# Patient Record
Sex: Female | Born: 1935 | Race: White | Hispanic: No | Smoking: Never smoker
Health system: Southern US, Community
[De-identification: ages and names within clinical notes are randomized; demographics above are authoritative.]

## PROBLEM LIST (undated history)

## (undated) DIAGNOSIS — N3281 Overactive bladder: Secondary | ICD-10-CM

## (undated) DIAGNOSIS — I272 Pulmonary hypertension, unspecified: Secondary | ICD-10-CM

## (undated) DIAGNOSIS — I4891 Unspecified atrial fibrillation: Secondary | ICD-10-CM

## (undated) DIAGNOSIS — I2510 Atherosclerotic heart disease of native coronary artery without angina pectoris: Secondary | ICD-10-CM

## (undated) DIAGNOSIS — I340 Nonrheumatic mitral (valve) insufficiency: Secondary | ICD-10-CM

## (undated) DIAGNOSIS — Z950 Presence of cardiac pacemaker: Secondary | ICD-10-CM

## (undated) DIAGNOSIS — I255 Ischemic cardiomyopathy: Secondary | ICD-10-CM

## (undated) DIAGNOSIS — I9789 Other postprocedural complications and disorders of the circulatory system, not elsewhere classified: Secondary | ICD-10-CM

## (undated) DIAGNOSIS — I6529 Occlusion and stenosis of unspecified carotid artery: Secondary | ICD-10-CM

## (undated) DIAGNOSIS — C801 Malignant (primary) neoplasm, unspecified: Secondary | ICD-10-CM

## (undated) DIAGNOSIS — I442 Atrioventricular block, complete: Secondary | ICD-10-CM

## (undated) DIAGNOSIS — I639 Cerebral infarction, unspecified: Secondary | ICD-10-CM

## (undated) DIAGNOSIS — I5042 Chronic combined systolic (congestive) and diastolic (congestive) heart failure: Secondary | ICD-10-CM

## (undated) DIAGNOSIS — M1990 Unspecified osteoarthritis, unspecified site: Secondary | ICD-10-CM

## (undated) DIAGNOSIS — E785 Hyperlipidemia, unspecified: Secondary | ICD-10-CM

## (undated) DIAGNOSIS — E119 Type 2 diabetes mellitus without complications: Secondary | ICD-10-CM

## (undated) DIAGNOSIS — I10 Essential (primary) hypertension: Secondary | ICD-10-CM

## (undated) DIAGNOSIS — N189 Chronic kidney disease, unspecified: Secondary | ICD-10-CM

## (undated) HISTORY — DX: Atherosclerotic heart disease of native coronary artery without angina pectoris: I25.10

## (undated) HISTORY — DX: Hyperlipidemia, unspecified: E78.5

## (undated) HISTORY — DX: Other postprocedural complications and disorders of the circulatory system, not elsewhere classified: I48.91

## (undated) HISTORY — DX: Other postprocedural complications and disorders of the circulatory system, not elsewhere classified: I97.89

## (undated) HISTORY — DX: Nonrheumatic mitral (valve) insufficiency: I34.0

## (undated) HISTORY — DX: Occlusion and stenosis of unspecified carotid artery: I65.29

## (undated) HISTORY — DX: Atrioventricular block, complete: I44.2

## (undated) HISTORY — DX: Pulmonary hypertension, unspecified: I27.20

## (undated) HISTORY — DX: Malignant (primary) neoplasm, unspecified: C80.1

## (undated) HISTORY — DX: Chronic combined systolic (congestive) and diastolic (congestive) heart failure: I50.42

## (undated) HISTORY — DX: Ischemic cardiomyopathy: I25.5

## (undated) HISTORY — PX: CATARACT EXTRACTION W/ INTRAOCULAR LENS  IMPLANT, BILATERAL: SHX1307

## (undated) HISTORY — DX: Essential (primary) hypertension: I10

---
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
## 1974-12-31 HISTORY — PX: SALPINGOOPHORECTOMY: SHX82

## 1996-12-31 HISTORY — PX: SHOULDER ARTHROSCOPY W/ ROTATOR CUFF REPAIR: SHX2400

## 2000-01-01 HISTORY — PX: TOTAL HIP ARTHROPLASTY: SHX124

## 2000-07-15 ENCOUNTER — Encounter: Payer: Self-pay | Admitting: Family Medicine

## 2000-07-15 ENCOUNTER — Encounter: Admission: RE | Admit: 2000-07-15 | Discharge: 2000-07-15 | Payer: Self-pay | Admitting: Family Medicine

## 2000-11-19 ENCOUNTER — Encounter: Payer: Self-pay | Admitting: Orthopedic Surgery

## 2000-11-25 ENCOUNTER — Inpatient Hospital Stay (HOSPITAL_COMMUNITY): Admission: RE | Admit: 2000-11-25 | Discharge: 2000-11-28 | Payer: Self-pay | Admitting: Orthopedic Surgery

## 2000-11-27 ENCOUNTER — Encounter: Payer: Self-pay | Admitting: Orthopedic Surgery

## 2000-11-28 ENCOUNTER — Inpatient Hospital Stay (HOSPITAL_COMMUNITY)
Admission: RE | Admit: 2000-11-28 | Discharge: 2000-12-05 | Payer: Self-pay | Admitting: Physical Medicine & Rehabilitation

## 2001-07-16 ENCOUNTER — Encounter: Admission: RE | Admit: 2001-07-16 | Discharge: 2001-07-16 | Payer: Self-pay | Admitting: Family Medicine

## 2001-07-16 ENCOUNTER — Encounter: Payer: Self-pay | Admitting: Family Medicine

## 2002-07-16 ENCOUNTER — Encounter: Payer: Self-pay | Admitting: Family Medicine

## 2002-07-16 ENCOUNTER — Encounter: Admission: RE | Admit: 2002-07-16 | Discharge: 2002-07-16 | Payer: Self-pay | Admitting: Family Medicine

## 2002-10-01 ENCOUNTER — Ambulatory Visit (HOSPITAL_COMMUNITY): Admission: RE | Admit: 2002-10-01 | Discharge: 2002-10-01 | Payer: Self-pay | Admitting: Gastroenterology

## 2003-07-01 ENCOUNTER — Encounter: Payer: Self-pay | Admitting: Family Medicine

## 2003-07-01 ENCOUNTER — Encounter: Admission: RE | Admit: 2003-07-01 | Discharge: 2003-07-01 | Payer: Self-pay | Admitting: Family Medicine

## 2004-07-07 ENCOUNTER — Ambulatory Visit (HOSPITAL_COMMUNITY): Admission: RE | Admit: 2004-07-07 | Discharge: 2004-07-07 | Payer: Self-pay | Admitting: Family Medicine

## 2005-07-13 ENCOUNTER — Ambulatory Visit (HOSPITAL_COMMUNITY): Admission: RE | Admit: 2005-07-13 | Discharge: 2005-07-13 | Payer: Self-pay | Admitting: Family Medicine

## 2005-07-31 DIAGNOSIS — I639 Cerebral infarction, unspecified: Secondary | ICD-10-CM

## 2005-07-31 HISTORY — PX: CORONARY ARTERY BYPASS GRAFT: SHX141

## 2005-07-31 HISTORY — DX: Cerebral infarction, unspecified: I63.9

## 2005-07-31 HISTORY — PX: MITRAL VALVE REPLACEMENT: SHX147

## 2005-08-15 ENCOUNTER — Encounter: Admission: RE | Admit: 2005-08-15 | Discharge: 2005-08-15 | Payer: Self-pay | Admitting: Cardiology

## 2005-08-17 ENCOUNTER — Inpatient Hospital Stay (HOSPITAL_COMMUNITY): Admission: AD | Admit: 2005-08-17 | Discharge: 2005-09-03 | Payer: Self-pay | Admitting: Cardiology

## 2005-08-17 ENCOUNTER — Ambulatory Visit: Payer: Self-pay | Admitting: Physical Medicine & Rehabilitation

## 2005-08-17 ENCOUNTER — Encounter (INDEPENDENT_AMBULATORY_CARE_PROVIDER_SITE_OTHER): Payer: Self-pay | Admitting: Cardiology

## 2005-08-20 ENCOUNTER — Encounter (INDEPENDENT_AMBULATORY_CARE_PROVIDER_SITE_OTHER): Payer: Self-pay | Admitting: Specialist

## 2005-10-06 ENCOUNTER — Inpatient Hospital Stay (HOSPITAL_COMMUNITY): Admission: EM | Admit: 2005-10-06 | Discharge: 2005-10-09 | Payer: Self-pay | Admitting: Emergency Medicine

## 2005-11-30 HISTORY — PX: CARDIAC DEFIBRILLATOR PLACEMENT: SHX171

## 2005-12-07 ENCOUNTER — Inpatient Hospital Stay (HOSPITAL_COMMUNITY): Admission: RE | Admit: 2005-12-07 | Discharge: 2005-12-08 | Payer: Self-pay | Admitting: Cardiology

## 2006-07-19 ENCOUNTER — Ambulatory Visit (HOSPITAL_COMMUNITY): Admission: RE | Admit: 2006-07-19 | Discharge: 2006-07-19 | Payer: Self-pay | Admitting: Family Medicine

## 2006-11-26 ENCOUNTER — Encounter: Admission: RE | Admit: 2006-11-26 | Discharge: 2006-11-26 | Payer: Self-pay | Admitting: Family Medicine

## 2007-04-15 ENCOUNTER — Ambulatory Visit: Payer: Self-pay | Admitting: Vascular Surgery

## 2007-04-15 ENCOUNTER — Ambulatory Visit (HOSPITAL_COMMUNITY): Admission: RE | Admit: 2007-04-15 | Discharge: 2007-04-15 | Payer: Self-pay | Admitting: Cardiology

## 2007-04-15 ENCOUNTER — Encounter: Payer: Self-pay | Admitting: Vascular Surgery

## 2007-04-30 ENCOUNTER — Emergency Department (HOSPITAL_COMMUNITY): Admission: EM | Admit: 2007-04-30 | Discharge: 2007-04-30 | Payer: Self-pay | Admitting: Emergency Medicine

## 2007-06-02 ENCOUNTER — Ambulatory Visit: Payer: Self-pay | Admitting: Vascular Surgery

## 2007-07-25 ENCOUNTER — Ambulatory Visit (HOSPITAL_COMMUNITY): Admission: RE | Admit: 2007-07-25 | Discharge: 2007-07-25 | Payer: Self-pay | Admitting: Family Medicine

## 2007-09-16 ENCOUNTER — Encounter: Admission: RE | Admit: 2007-09-16 | Discharge: 2007-09-16 | Payer: Self-pay | Admitting: Family Medicine

## 2008-06-11 ENCOUNTER — Ambulatory Visit: Payer: Self-pay | Admitting: Vascular Surgery

## 2008-07-30 ENCOUNTER — Ambulatory Visit (HOSPITAL_COMMUNITY): Admission: RE | Admit: 2008-07-30 | Discharge: 2008-07-30 | Payer: Self-pay | Admitting: Family Medicine

## 2008-11-13 ENCOUNTER — Emergency Department (HOSPITAL_COMMUNITY): Admission: EM | Admit: 2008-11-13 | Discharge: 2008-11-13 | Payer: Self-pay | Admitting: Emergency Medicine

## 2009-02-25 ENCOUNTER — Encounter: Admission: RE | Admit: 2009-02-25 | Discharge: 2009-02-25 | Payer: Self-pay | Admitting: Orthopedic Surgery

## 2009-06-24 ENCOUNTER — Ambulatory Visit: Payer: Self-pay | Admitting: Vascular Surgery

## 2009-08-01 ENCOUNTER — Ambulatory Visit (HOSPITAL_COMMUNITY): Admission: RE | Admit: 2009-08-01 | Discharge: 2009-08-01 | Payer: Self-pay | Admitting: Family Medicine

## 2009-09-23 ENCOUNTER — Emergency Department (HOSPITAL_COMMUNITY): Admission: EM | Admit: 2009-09-23 | Discharge: 2009-09-23 | Payer: Self-pay | Admitting: Emergency Medicine

## 2010-01-22 ENCOUNTER — Emergency Department (HOSPITAL_COMMUNITY): Admission: EM | Admit: 2010-01-22 | Discharge: 2010-01-22 | Payer: Self-pay | Admitting: Emergency Medicine

## 2010-06-16 ENCOUNTER — Ambulatory Visit: Payer: Self-pay | Admitting: Vascular Surgery

## 2010-08-02 ENCOUNTER — Ambulatory Visit (HOSPITAL_COMMUNITY): Admission: RE | Admit: 2010-08-02 | Discharge: 2010-08-02 | Payer: Self-pay | Admitting: Family Medicine

## 2011-03-18 LAB — CBC
Hemoglobin: 11.7 g/dL — ABNORMAL LOW (ref 12.0–15.0)
MCHC: 34.6 g/dL (ref 30.0–36.0)
MCV: 96 fL (ref 78.0–100.0)
RDW: 14.4 % (ref 11.5–15.5)

## 2011-03-18 LAB — POCT I-STAT, CHEM 8
Calcium, Ion: 1.3 mmol/L (ref 1.12–1.32)
Chloride: 104 mEq/L (ref 96–112)
Glucose, Bld: 145 mg/dL — ABNORMAL HIGH (ref 70–99)
HCT: 35 % — ABNORMAL LOW (ref 36.0–46.0)
Hemoglobin: 11.9 g/dL — ABNORMAL LOW (ref 12.0–15.0)
TCO2: 28 mmol/L (ref 0–100)

## 2011-03-18 LAB — DIFFERENTIAL
Basophils Absolute: 0.1 10*3/uL (ref 0.0–0.1)
Basophils Relative: 1 % (ref 0–1)
Eosinophils Absolute: 0.2 10*3/uL (ref 0.0–0.7)
Monocytes Absolute: 0.8 10*3/uL (ref 0.1–1.0)
Monocytes Relative: 13 % — ABNORMAL HIGH (ref 3–12)
Neutro Abs: 4.5 10*3/uL (ref 1.7–7.7)

## 2011-03-19 ENCOUNTER — Encounter (INDEPENDENT_AMBULATORY_CARE_PROVIDER_SITE_OTHER): Payer: Self-pay | Admitting: *Deleted

## 2011-03-29 NOTE — Letter (Signed)
Summary: Appointment - Reminder 2  [facility], Main Office  [address]   [place], [place] [address]   Phone: [phone]  Fax: [phone]     March 19, 2011 MRN: [id number]   [facility] [address] [place], [place]  [address]   Dear [name],  Our records indicate that it is time to schedule a follow-up appointment.  Dr. Johney Frame recommended that you follow up with [place] in April. It is very important that we reach you to schedule this appointment. We look forward to participating in your health care needs. Please contact us at the number listed above at your earliest convenience to schedule your appointment.  If you are unable to make an appointment at this time, give [place] a call so we can update our records.     Sincerely,   Glass blower/designer

## 2011-03-29 NOTE — Letter (Signed)
Summary: Appointment - Reminder 2  [facility], Main Office  [address]   [place], [place] [address]   Phone: [phone]  Fax: [phone]     March 19, 2011 MRN: [id number]   [name] [address] [place], [place]  [address]   Dear Ms. Crance,  Our records indicate that it is time to schedule a follow-up appointment.  Dr. Allred recommended that you follow up with us in April. It is very important that we reach you to schedule this appointment. We look forward to participating in your health care needs. Please contact us at the number listed above at your earliest convenience to schedule your appointment.  If you are unable to make an appointment at this time, give us a call so we can update our records.     Sincerely,   [facility] Scheduling Team 

## 2011-04-06 LAB — COMPREHENSIVE METABOLIC PANEL
AST: 25 U/L (ref 0–37)
Albumin: 3.8 g/dL (ref 3.5–5.2)
Alkaline Phosphatase: 50 U/L (ref 39–117)
BUN: 22 mg/dL (ref 6–23)
Chloride: 108 mEq/L (ref 96–112)
Potassium: 4 mEq/L (ref 3.5–5.1)
Sodium: 140 mEq/L (ref 135–145)
Total Bilirubin: 0.7 mg/dL (ref 0.3–1.2)
Total Protein: 6.5 g/dL (ref 6.0–8.3)

## 2011-04-06 LAB — URINALYSIS, ROUTINE W REFLEX MICROSCOPIC
Bilirubin Urine: NEGATIVE
Ketones, ur: NEGATIVE mg/dL
Nitrite: NEGATIVE
Protein, ur: NEGATIVE mg/dL
Urobilinogen, UA: 0.2 mg/dL (ref 0.0–1.0)
pH: 7 (ref 5.0–8.0)

## 2011-04-06 LAB — DIFFERENTIAL
Basophils Relative: 0 % (ref 0–1)
Eosinophils Absolute: 0.2 10*3/uL (ref 0.0–0.7)
Lymphs Abs: 1 10*3/uL (ref 0.7–4.0)
Monocytes Absolute: 0.5 10*3/uL (ref 0.1–1.0)
Monocytes Relative: 12 % (ref 3–12)
Neutrophils Relative %: 59 % (ref 43–77)

## 2011-04-06 LAB — CBC
Hemoglobin: 11.3 g/dL — ABNORMAL LOW (ref 12.0–15.0)
MCHC: 33.8 g/dL (ref 30.0–36.0)
Platelets: 143 10*3/uL — ABNORMAL LOW (ref 150–400)
RDW: 16 % — ABNORMAL HIGH (ref 11.5–15.5)

## 2011-04-26 ENCOUNTER — Encounter: Payer: Self-pay | Admitting: Internal Medicine

## 2011-04-27 ENCOUNTER — Encounter: Payer: Self-pay | Admitting: Internal Medicine

## 2011-04-30 ENCOUNTER — Encounter: Payer: Self-pay | Admitting: Internal Medicine

## 2011-04-30 ENCOUNTER — Ambulatory Visit (INDEPENDENT_AMBULATORY_CARE_PROVIDER_SITE_OTHER): Payer: Medicare Other | Admitting: Internal Medicine

## 2011-04-30 VITALS — BP 140/70 | HR 63 | Ht 62.0 in | Wt 169.0 lb

## 2011-04-30 DIAGNOSIS — I10 Essential (primary) hypertension: Secondary | ICD-10-CM

## 2011-04-30 DIAGNOSIS — I5022 Chronic systolic (congestive) heart failure: Secondary | ICD-10-CM | POA: Insufficient documentation

## 2011-04-30 DIAGNOSIS — I428 Other cardiomyopathies: Secondary | ICD-10-CM

## 2011-04-30 DIAGNOSIS — I2510 Atherosclerotic heart disease of native coronary artery without angina pectoris: Secondary | ICD-10-CM

## 2011-04-30 DIAGNOSIS — I509 Heart failure, unspecified: Secondary | ICD-10-CM

## 2011-04-30 NOTE — Assessment & Plan Note (Signed)
Stable without ischemic symptoms No change required today  

## 2011-04-30 NOTE — Patient Instructions (Signed)
Your physician wants you to follow-up in: 12 months with Dr Jacquiline Doe will receive a reminder letter in the mail two months in advance. If you don't receive a letter, please call our office to schedule the follow-up appointment.  Remote monitoring is used to monitor your Pacemaker of ICD from home. This monitoring reduces the number of office visits required to check your device to one time per year. It allows [place] to keep an eye on the functioning of your device to ensure it is working properly. You are scheduled for a device check from home on 08/02/2011. You may send your transmission at any time that day. If you have a wireless device, the transmission will be sent automatically. After your physician reviews your transmission, you will receive a postcard with your next transmission date.

## 2011-04-30 NOTE — Assessment & Plan Note (Signed)
Controlled. No changes today. 

## 2011-04-30 NOTE — Progress Notes (Signed)
[name] is a pleasant 77 y.o. yo patient with a h/o CAD, ischemic CM, and chronic systolic dysfunction sp ICD (MDT) by Dr Amil Amen for primary prevention of sudden death who presents today to establish care in the Electrophysiology device clinic.   The patient reports doing very well since having her ICD implanted and remains very active despite her age.  She has a Medtronic Fidelis [id number] RV lead but has had no issues with this.  Today, she  denies symptoms of palpitations, chest pain, shortness of breath, orthopnea, PND, lower extremity edema, dizziness, presyncope, syncope, or neurologic sequela.  She is primarily limited by arthritis.  The patientis tolerating medications without difficulties and is otherwise without complaint today.   Past Medical History  Diagnosis Date  . Coronary artery disease   . Ischemic cardiomyopathy     s/p ICD (MDT) by Dr Amil Amen 2006  . Carotid stenosis   . Diabetes mellitus   . Hypertension   . Mitral regurgitation   . Dyslipidemia   . Cerebrovascular disease     extracranial  . Osteoporosis   . Chronic systolic dysfunction of left ventricle     Past Surgical History  Procedure Date  . Cardiac defibrillator placement 2006    by Dr Amil Amen (MDT) she has a [phone] fidelis lead  . Cataract extraction   . Mitral valve repair   . Appendectomy   . Coronary artery bypass graft 2006  . Salpingoophorectomy 1976    bilateral  . Righ hip repla   . Total hip arthroplasty 2001    right    History   Social History  . Marital Status: Widowed    Spouse Name: N/A    Number of Children: N/A  . Years of Education: N/A   Occupational History  . retired Engineer, civil (consulting)    Social History Main Topics  . Smoking status: Never Smoker   . Smokeless tobacco: Not on file  . Alcohol Use: No  . Drug Use: No  . Sexually Active: Not on file   Other Topics Concern  . Not on file   Social History Narrative  . No narrative on file    Family History  Problem Relation  Age of Onset  . Coronary artery disease      family hx of  . Hypertension      family hx of  . Diabetes      family hx of    Allergies  Allergen Reactions  . Clonidine Derivatives   . Darvocet (Propoxyphene N-Acetaminophen)   . Fosamax   . Horse-Derived Products   . Penicillins   . Sulfa Drugs Cross Reactors   . Vicodin (Hydrocodone-Acetaminophen)     Current Outpatient Prescriptions  Medication Sig Dispense Refill  . Acetaminophen (TYLENOL EXTRA STRENGTH PO) Take 1-2 tablets by mouth every 6 (six) hours as needed.        Marland Kitchen amLODipine (NORVASC) 5 MG tablet Take 5 mg by mouth daily.        Marland Kitchen aspirin 325 MG EC tablet Take 325 mg by mouth daily.        . Calcium Carbonate-Vitamin D (CALTRATE 600+D) 600-400 MG-UNIT per tablet Take 1 tablet by mouth 2 (two) times daily.        . carvedilol (COREG) 12.5 MG tablet Take 12.5 mg by mouth 2 (two) times daily with a meal.        . Cholecalciferol (VITAMIN D) 2000 UNITS tablet Take 2,000 Units by mouth daily.        Marland Kitchen  cyanocobalamin 2000 MCG tablet Take 2,000 mcg by mouth daily.        Marland Kitchen ezetimibe-simvastatin (VYTORIN) 10-20 MG per tablet Take 1 tablet by mouth at bedtime.        . fish oil-omega-3 fatty acids 1000 MG capsule Take 2 g by mouth 2 (two) times daily.        . furosemide (LASIX) 40 MG tablet Take 20 mg by mouth daily.        Marland Kitchen glipiZIDE (GLUCOTROL) 10 MG 24 hr tablet Take 20 mg by mouth daily.        Marland Kitchen losartan (COZAAR) 100 MG tablet Take 100 mg by mouth daily.        . Multiple Vitamins-Minerals (CENTRUM SILVER PO) Take 2 tablets by mouth daily.        Marland Kitchen oxybutynin (DITROPAN-XL) 10 MG 24 hr tablet Take 10 mg by mouth daily.        . ramipril (ALTACE) 10 MG capsule Take 10 mg by mouth daily.          ROS- all systems are reviewed and negative except as per HPI  Physical Exam: Filed Vitals:   04/30/11 1104  BP: 140/70  Pulse: 63  Height: 5\' 2"  (1.575 m)  Weight: 169 lb (76.658 kg)    GEN- The patient is well  appearing but elderly, alert and oriented x 3 today.   Head- normocephalic, atraumatic Eyes-  Sclera clear, conjunctiva pink Ears- hearing intact Oropharynx- clear Neck- supple, no JVP Lymph- no cervical lymphadenopathy Lungs- Clear to ausculation bilaterally, normal work of breathing Chest- ICD pocket is well healed Heart- Regular rate and rhythm, no murmurs, rubs or gallops, PMI not laterally displaced GI- soft, NT, ND, + BS Extremities- no clubbing, cyanosis,  + edema MS- no significant deformity or atrophy Skin- no rash or lesion Psych- euthymic mood, full affect Neuro- strength and sensation are intact  ICD interrogation- reviewed in detail today,  See PACEART report  Assessment and Plan:

## 2011-04-30 NOTE — Assessment & Plan Note (Signed)
Currently euvolemic on exam. Normal defibrillator function She has a sprint fidelis RV lead without impedance issues or elevated SIC counts.  She has a magnet and has been instructed upon how to use it should she receive inappropriate shocks for failed RV lead. We had a long discussion today about her ICD and RV lead.  She is very clear that she wishes to avoid any further procedures in the future.  She wishes to consider turning her ICD off if she develops problems with her fidelis lead.  She is unlikely to want generator replacement once she is at [facility]. Given her advanced age, I think that these are reasonable requests. We will continue current therapy at this time and follow. See Arita Miss Art report No changes today

## 2011-05-15 NOTE — Consult Note (Signed)
VASCULAR SURGERY CONSULTATION   [name], [name]  DOB:  12-17-1928                                       06/02/2007  ZOXWR#:[id number]   [name] presents today for evaluation of x-ray and cerebrovascular  occlusive disease.  She is a very pleasant 77 year old white female with  recent carotid duplex revealing probable right internal carotid artery  occlusion and moderate left internal carotid artery stenosis.  She  reports some generalized weakness but denies any specific focal  neurologic deficits.  Her past history is significant for non-insulin  dependent diabetes, hypertension and congestive heart failure.  She does  have accelerated premature atherosclerotic disease in her mother,  brothers and sisters.  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  She does not smoke or drink alcohol on a regular basis.   REVIEW OF SYSTEMS:  Positive for shortness of breath, exertion, urinary  frequency, arthritic joint pain, also has had cataract surgery.   PHYSICAL EXAMINATION:  GENERAL:  Well-developed, well-nourished white  female, appearing stated age 60.  VITAL SIGNS:  Blood pressure today is 168/90, pulse 74, respirations 18.  Her carotid arteries are without bruit today by my exam.  She is grossly  intact neurologically.  She has 2+ radial pulses.  I have reviewed her  recent carotid duplex from [facility].  On April 15, 2007, I  compared this to her study at the time of her cardiac surgery in 2006.  She does appear to have an occluded right internal carotid, and this  appears to be consistent with her study two years ago.  I explained the  importance of continued surveillance of her left carotids, and she has  an  occluded right carotid artery.  We will see her again in one year, and  she knows to notify me should she develop any neurologic deficits in the  interim.   Larina Earthly, M.D.  Electronically Signed  TFE/MEDQ  D:  06/02/2007  T:   06/02/2007  Job:  [id number]   cc:   Armanda Magic, M.D.  Donia Guiles, M.D.

## 2011-05-15 NOTE — Procedures (Signed)
CAROTID DUPLEX EXAM   INDICATION:  Follow up known carotid disease.   HISTORY:  Diabetes:  Yes  Cardiac:  CABG in 2006  Hypertension:  Yes  Smoking:  No  Previous Surgery:  No  CV History:  Asymptomatic  Amaurosis Fugax No, Paresthesias No, Hemiparesis No                                       RIGHT             LEFT  Brachial systolic pressure:         150               150  Brachial Doppler waveforms:         normal            normal  Vertebral direction of flow:        antegrade         antegrade  DUPLEX VELOCITIES (cm/sec)  CCA peak systolic                   107               96  ECA peak systolic                   90                86  ICA peak systolic                   occlusive         171  ICA end diastolic                   occlusive         36  PLAQUE MORPHOLOGY:                  calcific          calcific  PLAQUE AMOUNT:                      occlusive         moderate  PLAQUE LOCATION:                    ICA               ICA   IMPRESSION:  1. Known right internal carotid artery occlusion.  2. Left Doppler velocities suggest 40%-59% stenosis in the left      internal carotid artery.  3. Antegrade flow in bilateral vertebral arteries.  4. Stable from previous exams.   ___________________________________________  Larina Earthly, M.D.   NT/MEDQ  D:  06/16/2010  T:  06/16/2010  Job:  [id number]

## 2011-05-15 NOTE — Procedures (Signed)
CAROTID DUPLEX EXAM   INDICATION:  Follow-up evaluation of known carotid artery disease.   HISTORY:  Diabetes:  Yes.  Cardiac:  Coronary artery bypass graft in 2006.  Hypertension:  Yes.  Smoking:  No.  Previous Surgery:  No.  CV History:  No.  Amaurosis Fugax No, Paresthesias No, Hemiparesis No.                                       RIGHT             LEFT  Brachial systolic pressure:                           144  Brachial Doppler waveforms:                           WNL  Vertebral direction of flow:        Antegrade         Antegrade  DUPLEX VELOCITIES (cm/sec)  CCA peak systolic                   176               94  ECA peak systolic                   124               102  ICA peak systolic                   Occluded          123  ICA end diastolic                   Occluded          45  PLAQUE MORPHOLOGY:                  Heterogenous      Calcified  PLAQUE AMOUNT:                      Severe            Moderate  PLAQUE LOCATION:                    ICA               BIF, ICA   IMPRESSION:  1. Known occluded right internal carotid artery.  2. 40-59% left internal carotid artery stenosis.        ___________________________________________  Larina Earthly, M.D.   AC/MEDQ  D:  06/24/2009  T:  06/24/2009  Job:  [id number]

## 2011-05-15 NOTE — Assessment & Plan Note (Signed)
OFFICE VISIT   [name], [name]  DOB:  Sep 16, 1928                                       06/11/2008  EAVWU#:[id number]   The patient presents today for continued followup of her extracranial  cerebrovascular occlusive disease.  She is a pleasant 77 year old white  female with a known history of carotid stenosis.  She denies any  neurologic deficit since my last visit with her 1 year ago.  She also  remains stable from a cardiac standpoint.   PHYSICAL EXAM:  Today she is grossly intact neurologically.  Her blood  pressure is 158/71 in the left arm and 153/71 in her right arm, pulse  60, respirations 18.  Her radial pulses are 2+ bilaterally.  Carotid  arteries are without bruits bilaterally.  Heart:  Regular rate and  rhythm.  Chest is clear bilaterally.   She underwent repeat carotid duplex and this reveals a known occluded  right internal carotid artery.  She has a moderate stenosis in the 40-  60% range in the left internal carotid artery and this is unchanged.  I  discussed this at length with the patient.  She knows to notify us  immediately should she develop any neurologic deficits.  Otherwise we  will see her again in 1 year with yearly carotid duplex to rule out any  progression of her stenosis.   Larina Earthly, M.D.  Electronically Signed   TFE/MEDQ  D:  06/11/2008  T:  06/14/2008  Job:  [id number]   cc:   Armanda Magic, M.D.  Donia Guiles, M.D.

## 2011-05-15 NOTE — Procedures (Signed)
CAROTID DUPLEX EXAM   INDICATION:  Follow-up evaluation of known carotid artery disease.   HISTORY:  Diabetes:  Yes.  Cardiac:  Coronary artery bypass graft in 2006.  Hypertension:  Yes.  Smoking:  No.  Previous Surgery:  No.  CV History:  Patient has a known right internal carotid artery stenosis.  Amaurosis Fugax No, Paresthesias No hemiparesis No                                       RIGHT             LEFT  Brachial systolic pressure:         160               158  Brachial Doppler waveforms:         Triphasic         Triphasic  Vertebral direction of flow:        Antegrade         Antegrade  DUPLEX VELOCITIES (cm/sec)  CCA peak systolic                   76                85  ECA peak systolic                   117               61  ICA peak systolic                   Occluded          127  ICA end diastolic                   Occluded          26  PLAQUE MORPHOLOGY:                  Mixed             Calcified,  irregular  PLAQUE AMOUNT:                      Severe            Mild  PLAQUE LOCATION:                    Throughout ICA    Proximal ICA   IMPRESSION:  1. Occluded right internal carotid artery.  2. 40-59% left internal carotid artery stenosis.  3. No significant change from previous study.   ___________________________________________  Larina Earthly, M.D.   MC/MEDQ  D:  06/11/2008  T:  06/11/2008  Job:  [id number]

## 2011-05-18 NOTE — Op Note (Signed)
[name], [name]                ACCOUNT NO.:  [id number]   MEDICAL RECORD NO.:  [id number]          PATIENT TYPE:  INP   LOCATION:  2302                         FACILITY:  [facility]   PHYSICIAN:  Kerin Perna, M.D.  DATE OF BIRTH:  Jul 05, 1928   DATE OF PROCEDURE:  08/20/2005  DATE OF DISCHARGE:                                 OPERATIVE REPORT   OPERATION:  1.  Coronary artery bypass grafting x3 (left internal mammary artery to left      anterior descending, saphenous vein graft to ramus intermediate,      saphenous vein graft to circumflex marginal).  2.  Mitral valve repair (24-mm Edwards Physio annuloplasty range, model      4450, serial number [id number], and posterior commissuroplasty).   PREOPERATIVE DIAGNOSES:  1.  Ischemic mitral regurgitation.  2.  Class III congestive heart failure.  3.  Severe left ventricular dysfunction with two-vessel coronary artery      disease.   POSTOPERATIVE DIAGNOSES:  1.  Ischemic mitral regurgitation.  2.  Class III congestive heart failure.  3.  Severe left ventricular dysfunction with two-vessel coronary artery      disease.   SURGEON:  Kerin Perna, M.D.   ASSISTANT:  Danelle Earthly, PA-C   ANESTHESIA:  General by Dr. Sheldon Silvan.   INDICATIONS:  The patient is a 77 year old female diabetic who presents with  class III symptoms of angina and congestive heart failure.  A 2-D echo and  cardiac cath demonstrated severe LV dysfunction and severe coronary disease  with high-grade stenosis of the LAD, ramus and circumflex.  The right  coronary did not have significant disease.  Ejection fraction was 20% and  there is moderate mitral regurgitation.  Because of the patient's symptoms  and coronary anatomy, and LV dysfunction, she was felt be a candidate for  surgical revascularization and mitral valve repair.  I examined the patient  in her  hospital room preoperatively and reviewed the results the cardiac  cath and echo with the  patient and her family.  I discussed the indications  and expected benefits of coronary artery bypass surgery and mitral valve  repair for treatment of her coronary disease and mitral regurgitation.  I  discussed the alternatives to surgical therapy for heart disease as well.  I  reviewed with the patient the major aspects of the planned procedure  including the choice of conduit for grafts, the location of the surgical  incisions, the plan to perform a mitral valve repair or a mitral valve  replacement, and the use of general anesthesia and cardiopulmonary bypass.  I discussed with the patient the expected postoperative course as well as  the potential postoperative complications including risks of MI, CVA,  bleeding, blood transfusion requirement, wound infection, prolonged  ventilator dependence, and death.  She understood these implications for the  surgery and agreed to proceed with the operation as planned under what I  felt was an informed consent.   OPERATIVE FINDINGS:  The patient has severe LV dysfunction with ejection  fraction by TEE preoperatively of 20% or less.  She had moderate mitral  regurgitation with elevated pulmonary artery pressures.  The vein was  harvested endoscopically from the right leg and was of good quality.  The  mammary artery was a good vessel with excellent flow.  After separation from  bypass, the patient showed slight improvement in her global LV function  without mitral regurgitation.  Because of a platelet count less than 90,000  , she was given a unit of platelets postoperatively.  As soon as the  infusion started; however, she had apparent reaction to the platelets with  systemic hypotension which was treated with internal massage, calcium and  epinephrine with resuscitation of stable blood pressure and cardiac output  and stable or improved LV function.  She did not require placement back on  bypass.  The patient received 2 units of packed cells at  the onset of the  cardiopulmonary bypass for a hemoglobin was 7 grams.   PROCEDURE:  The patient was brought to the operating room and placed supine  on the operating table where general anesthesia was induced under invasive  hemodynamic monitoring.  The chest, abdomen and legs were prepped with  Betadine and draped as a sterile field.  A sternal incision was made as the  saphenous vein was harvested endoscopically from the right leg.  The left  internal mammary artery was harvested as a pedicle graft from its origin at  the subclavian vessels and was a good vessel with excellent flow.  Heparin  was administered systemically for the aprotinin protocol used for this  operation.  The ACT was documented as being therapeutic and the sternal  retractor was placed.  The pericardium was opened and suspended.  Pursestrings were placed in the ascending aorta and right atrium as the  patient was cannulated and placed on bypass.  A second atrial cannula was  placed for bicaval drainage.  The coronaries were identified for grafting.  Cardioplegia catheters were placed for both the ascending aortic and  retrograde coronary sinus cardioplegia.  The superior and inferior vena  cavae were dissected out and Vesseloops were placed around the 2 caval  cannula.  The interatrial groove was dissected.  The patient was cooled to  30 degrees.  The aortic crossclamp was applied and 800 mL of cold blood  cardioplegia were delivered in split doses between the antegrade aortic and  retrograde coronary sinus catheters.  There was good cardioplegic arrest and  septal temperature dropped to less than 12 degrees.  Topical iced saline was  used to augment myocardial preservation and a pericardial insulator pad was  used to protect the left phrenic nerve.   The distal coronary anastomoses were then performed.  The first distal anastomosis was to the ramus intermediate.  There is a 1.5-mm vessel with a  proximal 90%  stenosis.  A reversed saphenous vein sewn end-to-side with  running 7-0 Prolene with good flow through the graft.  The second distal  anastomosis was the to the circumflex marginal.  This was a slightly larger  1.6-mm vessel with proximal 90% stenosis.  A reversed saphenous vein was  sewn end-to-side with running 7-0 Prolene with good flow through the graft.  Cardioplegia was redosed.  The third distal anastomosis was to the mid LAD.  There was a 1.75-mm vessel, intramyocardial.  The left IMA pedicle was  brought through an opening created in the left lateral pericardium, was  brought down onto the LAD and sewn end-to-side with a running 8-0 Prolene.  There was  excellent flow through the anastomosis after briefly releasing the  pedicle bulldog on the mammary pedicle.  The pedicle was secured to the  epicardium and cardioplegia was redosed.   Attention was then directed to the mitral valve.  A left atriotomy was made  in the interatrial groove.  The atrial retractors were positioned to expose  the mitral valve, which was difficult due to the patient's body habitus.  The mitral valve was inspected and there were no discrete areas of prolapse  or a ruptured chordae.  The mechanism of mitral regurgitation seemed to be  annular dilatation with possibly some retraction the A3-P3 segments.  There  was a cleft in the anterior leaflet between the A2 and A3 segments, and this  was closed with a single figure-of-eight 4-0 Ethibond.  The angioplasty  sutures of 2-0 Ethicon were then placed around the annulus numbering 11  sutures.  The anterior leaflet was sized to a 24-mm Carpentier-Edwards  flexible-ring (Physio ring, model 4450, serial number [id number]).  The  angioplasty sutures were placed through the ring and the ring was seated and  sutures were tied.  The valve was retested and was completely competent.  The atriotomy was then closed in 2 layers using a running 4-0 Prolene.  Cardioplegia was  redosed.   The proximal anastomoses were placed on the ascending aorta using a 4.0-mm  punch with running 6-0 Prolene.  A root vent was left in the ascending aorta  to remove blood from the heart during the proximal anastomoses and to remove  air from the heart after removal of the crossclamp.  Prior to tying down the  final proximal anastomosis, the mammary artery was opened and the heart was  perfused with retrograde warm blood cardioplegia to remove air from the  coronaries and the left side of heart.  The crossclamp was then removed.   The heart resumed a spontaneous rhythm.  Air was aspirated from the vein  grafts using a 27-gauge needle and the vein grafts were opened and perfused  and each had good flow.  The cardioplegia cannulas were removed.  The venous  cannula to the IVC was removed to leave single venous drainage.  The patient was rewarmed and reperfused.  Temporary pacing wires were applied.  When the  patient reached 37 degrees, the lungs re-expanded and the ventilator was  resumed.  The patient was then weaned from bypass on low-dose dopamine and  milrinone.  Blood pressure and cardiac output were satisfactory.  The  cannulas were removed after a test dose of protamine was given uneventfully.  The remainder of the protamine was administered.  The mediastinum was  irrigated with warm antibiotic irrigation.  The transesophageal echo showed  stable LV function from preop and no mitral regurgitation.  The platelet  count of 90,000 returned and the patient had some persistent coagulopathy  and so platelets were begun.  The patient experienced a platelet transfusion  reaction with a sudden drop in blood pressure requiring volume, epinephrine,  calcium and internal massage.  After a few minutes, the blood pressure  stabilized to normal.  The patient never had a period of asystole or loss of  blood pressure.   The echo was checked again and the LV function looked better and the  mitral  valve looked fine.  The patient was observed for several minutes and in  fact, was given heparin in preparation for going back on bypass, which was  not performed.  The cannulation sutures  were then removed and the grafts  were checked carefully and there was no evidence of air.  After watching the  patient for several minutes and after remaining hemodynamically stable,  protamine was again administered without adverse reaction.  The mediastinum  was again irrigated with warm antibiotic irrigation.  The leg incision was  irrigated and closed in a standard fashion.  A right femoral A-line was  placed due to a dampened radial artery pressure waveform.  The superior  pericardial fat was closed over the aorta and vein grafts.  Two mediastinal  and a left pleural chest tube were placed and brought out through separate  incisions.  The sternum was closed with interrupted steel wire.  The  pectoralis fascia was closed with a running #1 Vicryl.  The subcutaneous and  skin layers were closed with a running Vicryl and sterile dressings were  applied.  Total bypass time was 170 minutes with crossclamp time of 120  minutes.      Kerin Perna, M.D.  Electronically Signed     PV/MEDQ  D:  08/20/2005  T:  08/21/2005  Job:  [id number]   cc:   Armanda Magic, M.D.  [address], [address]  [place], [place] [address]  Fax: [phone]   CVTS Office

## 2011-05-18 NOTE — Discharge Summary (Signed)
[name], GARLING NO.:  [id number]   MEDICAL RECORD NO.:  [id number]          PATIENT TYPE:  INP   LOCATION:  4731                         FACILITY:  [facility]   PHYSICIAN:  Armanda Magic, M.D.     DATE OF BIRTH:  02/03/1928   DATE OF ADMISSION:  10/06/2005  DATE OF DISCHARGE:  10/09/2005                                 DISCHARGE SUMMARY   ADMITTING PHYSICIAN:  Meade Maw, M.D.;  Discharging physician:  Armanda Magic, M.D.   PRIMARY CARE PHYSICIANS:  Primary cardiologist is Armanda Magic, M.D.,  primary care physician is Donia Guiles, M.D., CVTS doctor is Kerin Perna, M.D.   CHIEF COMPLAINT/REASON FOR ADMISSION:  [name] is a 77 year old female  patient who recently has undergone coronary artery bypass grafting surgery  and mitral valve ring repair August 20, 2005.  She had a complicated  hospital course as noted in the history and physical and discharged on  September 13, 2005.  Since discharge she has made very little progress.  She  is significantly deconditioned and at the insistence of her daughter, she  came to the emergency room for increasing shortness of breath for four days,  no orthopnea, mild ankle edema in the evenings, no other positive findings  on review of systems such as cough, fevers or chills.  On examination her  vital signs were stable, she was mildly tachycardic with heart rate of 102.  She was sating 95% on room air.  She had no signs of jugular venous  distention.  Lungs were clear.  She was non labored and not tachypneic on  examination.  Her ABG's were unremarkable.  Hemoglobin was 13, creatinine  1.1, potassium 4.7, BNP was pending.  Cardiac isoenzymes were negative.  The  patient was admitted with the following diagnoses.   ADMISSION DIAGNOSES:  1.  Shortness of breath X4 days in patient with known deconditioning status      post complicated recent coronary artery bypass grafting surgery.  2.  Known left ventricular  dysfunction with ejection fraction 25 to 30%.  3.  Hypertension.  4.  Diabetes.   HOSPITAL COURSE:  The patient was admitted to the telemetry unit where she  was started on intravenous Lasix for diuresis for suspected volume overload  and mild congestive heart failure decompensation.  She was found to have  mild expiratory wheezing on examination.  Xopenex was added and this seemed  to help her respiratory status somewhat.  With the diuresis she became  mildly hypokalemic and this was replaced.  On October 08, 2005 Dr. Mayford Knife  resumed care.  The patient continued to diurese nicely with fluid in the  negative balance.  Lovenox was added for deep venous thrombosis prophylaxis.  Altace was increased to 5 mg per day.  Potassium being low remained a  problem and potassium was repleted orally.  Lasix was changed from 80 to 40  mg intravenously daily.   By October 09, 2005 the patient was markedly improving. Her blood pressure  was slightly low at 99/54 but she was tolerating it.  She was non-  tachycardic.  Her initial BNP which had been 2414 was now down to 185. Her  creatinine was up slightly at 1.7. The potassium was 3.7.  Dr. Mayford Knife opted  to go ahead and change her over to p.o. Lasix and planned for her to be  discharged home with the addition of Lasix 40 mg daily as well as potassium  supplementation due to continued problems with hypokalemia this admission.   FINAL DISCHARGE DIAGNOSES:  1.  Congestive heart failure exacerbation, resolved.  2.  Known severe left ventricular dysfunction with ejection fraction of 25      to 35% per catheterization.  3.  Recent coronary artery bypass grafting surgery with mitral valve ring.  4.  Deconditioning.  5.  Hypertension.  6.  Renal insufficiency.  7.  Hypokalemia.   DISCHARGE MEDICATIONS:  1.  Lasix 40 mg daily.  2.  Altace 5 mg daily.  3.  Aspirin 325 mg daily.  4.  Coreg 3.125 mg b.i.d.  5.  Lipitor 40 mg daily.  6.  Glipizide ER 10 mg  daily.  7.  Actos 30 mg daily.  8.  Detrol LA 4 mg daily.  9.  Estradiol 0.5 mg daily.  10. Multivitamin daily.   FOLLOW UP:  The patient is to see Dr. Mayford Knife in one week.  She is to call  for an appointment.  She is to remain on a sodium-restricted cardiac diet.   ACTIVITY:  As before.  Please resume any physical therapy or other cardiac  rehabilitation.      [name], M.D.  Electronically Signed    ALE/MEDQ  D:  01/29/2006  T:  01/29/2006  Job:  [id number]   cc:   Kerin Perna, M.D.  [address]  [place]  [place] [address]   Donia Guiles, M.D.  Fax: [phone]

## 2011-05-18 NOTE — Op Note (Signed)
[name], [name]                ACCOUNT NO.:  [id number]   MEDICAL RECORD NO.:  [id number]          PATIENT TYPE:  INP   LOCATION:  2024                         FACILITY:  [facility]   PHYSICIAN:  Francisca December, M.D.  DATE OF BIRTH:  Oct 14, 1928   DATE OF PROCEDURE:  12/07/2005  DATE OF DISCHARGE:                                 OPERATIVE REPORT   PROCEDURES PERFORMED:  1.  Insertion of single-chamber implantable cardiac defibrillator.  2.  Left subclavian venogram.   SURGEON:  Francisca December, M.D.   INDICATION:  [name] is a 77 year old woman with a known ischemic  cardiomyopathy, status post CABG and the mitral valve ring repair in August  2006.  She has known severe systolic dysfunction with an EF of 25% recently  confirmed by MUGA study.  She is therefore brought to the cardiac  catheterization laboratory at this time for insertion of a prophylactic  implantable cardiac defibrillator under MADIT II criteria.   PROCEDURAL NOTE:  The patient was brought to cardiac catheterization  laboratory in a fasting state.  The left prepectoral region was prepped and  draped in the usual sterile fashion.  Local anesthesia was obtained with the  infiltration of 1% lidocaine with epinephrine throughout the left  prepectoral region.  A left subclavian venogram was then performed with a  peripheral injection of 20 mL of Omnipaque.  A digital cine angiogram in the  AP projection was obtained and roadmapped to guide future left subclavian  puncture.  The venogram did demonstrate the left subclavian vein to be  widely patent and coursing in a normal fashion over the anterior surface of  the first rib and beneath the middle third of the clavicle.  There was no  evidence for persistence of the left superior vena cava.   A 7- to 8-cm incision was then made in the deltopectoral groove and this was  carried down by sharp dissection and electrocautery to the prepectoral  fascia.  There, a plane  was lifted and a pocket formed inferiorly and  medially utilizing blunt dissection and electrocautery.  The pocket was then  packed with a 1% kanamycin-soaked gauze.  A single left subclavian puncture  was then performed using an 18-gauge thin-walled needle through which was  passed a 0.038-inch tight J guidewire.  Over this guidewire, a 9-French  tearaway sheath and dilator were advanced.  The dilator was removed and wire  was allowed to remain in place.  The ventricular lead was advanced to the  level of the right atrium and the sheath was torn away.  Using standard  technique and fluoroscopic landmarks, the lead was manipulated into the  right ventricular apex.  There, excellent pacing parameters were obtained,  as will be noted below.  This was an active-fixation lead and the screw was  advanced as appropriate.  The lead was tested for diaphragmatic pacing at 10  volts and none was found.  The lead was then sutured into place using 3  separate 0 silk ligatures.  The remaining guidewire and the kanamycin-soaked  gauze were removed from  the pocket and the pocket was copiously irrigated  using 1% kanamycin solution.  The pace-shock lead was then connected to the  defibrillator generator under the direction of the [employer] representative.  Each lead was placed in the appropriate receptacle and tightened into place  with testing for security.  The leads were then wound beneath the pace-shock  generator and the generator was placed in the pocket.  The device was  anchored to the thick pectoralis muscle with a 0 silk ligature.  The wound  was then closed using 2-0 Vicryl in a running fashion for the subcutaneous  layers.  The skin was approximated using 4-0 Vicryl in a running  subcuticular fashion.  Defibrillation threshold testing was then performed  initially using the shock-on-T method.  The patient had received a total of  10 mg of midazolam and 100 mcg of fentanyl for adequate sedation.   Shock-on-  T method, however, resulted in ventricular fibrillation that was self-  terminating.  Therefore we converted to alternating current induction at 50  Hz, 20-milliseconds interval timing.  This did result in sustained  ventricular fibrillation that was easily detected and terminated with a 20-  joule shock with prompt return of sinus rhythm.  The impedance was 40 ohms  and the charge time was 8 seconds.  One dropout occurred at 1.2-mV  sensitivity.  Further attempts after 5 minutes to induce a second episode of  ventricular fibrillation with burst-pacing at 50 Hz was not successful and  further efforts were terminated.   The device was programmed with the VT zone beginning at 170, VF zone at 210  with the ability to pace-terminate during charge.   EQUIPMENT DATA:  The pace-shock generator is a [employer] EnTrust model  number D154VRC, serial number [id number].  The pace-shock lead is [employer]  model number [id number], serial number [id number].   PACING DATA:  The ventricular lead detected a 7.0-mV R wave.  The pacing  threshold was 1 volt at 0.5-milliseconds pulse width.  The impedance was  1013 ohms, resulting in a currented capture threshold of 1.1 mA.      Francisca December, M.D.  Electronically Signed     JHE/MEDQ  D:  12/07/2005  T:  12/08/2005  Job:  [id number]   cc:   Donia Guiles, M.D.  Fax: [phone]

## 2011-05-18 NOTE — Cardiovascular Report (Signed)
NAMEKANAE, IGNATOWSKI                ACCOUNT NO.:  [id number]   MEDICAL RECORD NO.:  [id number]          PATIENT TYPE:  INP   LOCATION:  2302                         FACILITY:  [facility]   PHYSICIAN:  Armanda Magic, M.D.     DATE OF BIRTH:  1928/01/23   DATE OF PROCEDURE:  08/16/2005  DATE OF DISCHARGE:                              CARDIAC CATHETERIZATION   REFERRING PHYSICIAN:  Donia Guiles, M.D.   PROCEDURE:  Left heart catheterization, coronary angiography, left  ventriculography.   OPERATOR:  Armanda Magic, M.D.   INDICATIONS:  Shortness of breath and abnormal Cardiolite.   COMPLICATIONS:  None.   IV ACCESS:  Via right femoral artery 6-French sheath.   This is a very pleasant 77 year old white female who presented with  shortness of breath and was found to have a dilated cardiomyopathy with  several reversible perfusion defects on Cardiolite scan and now presents for  cardiac catheterization.   The patient is brought to cardiac catheterization laboratory in a fasting  nonsedated state. Informed consent was obtained. The patient was connected  to continuous heart rate and pulse oximetry monitoring, intermittent blood  pressure monitoring. The right groin was prepped and draped in sterile  fashion. Xylocaine 1% was used for local anesthesia. Using modified  Seldinger technique, a 6-French sheath was placed right femoral artery.  Under fluoroscopic guidance, a 6-French JL-4 catheter was placed in the left  coronary artery. Multiple cine films were taken at 30 degree RAO and 40  degree LAO views. This catheter was then exchanged out over a guidewire for  6-French JR-4 catheter which was placed under fluoroscopic guidance in the  right coronary artery. Multiple cine films were taken at 30 degree RAO and  40 degree LAO views. This catheter was exchanged out over a guidewire for 6-  French angled pigtail catheter which was placed under fluoroscopic guidance  in the left ventricular  cavity. Left ventriculography was performed in 30  degree RAO view using total of 30 mL of contrast at 15 mL per second. The  catheter was then pulled back across the aortic valve with no significant  gradient noted. At the end of the procedure, all catheters and sheaths were  removed. Manual compression was performed until adequate hemostasis was  obtained. The patient was transferred back to her room in stable condition.   RESULTS:  Left main coronary artery has an ostial 30% narrowing and then  bifurcates in the left anterior descending artery and left circumflex  artery. Left anterior descending artery has a 70-80% proximal to mid  stenosis between the first and second diagonals. The rest of the LAD is  widely patent, ongoing to the apex.  The left circumflex gives rise to a  very high small obtuse marginal I branch and there is an 80% narrowing just  prior to the takeoff of a very large obtuse marginal II branch. The ongoing  left circumflex traverses the AV groove and is widely patent. The OM II  gives off one small branch and then trifurcates into three daughter branches  all of which are widely patent.  Of note, the diagonal branch off the LAD has  some luminal irregularities proximally.   The right coronary artery has luminal irregularities in the proximal and mid  portions, then there is a 50% eccentric lesion in the distal RCA just prior  to the bifurcation of posterior descending artery and posterior lateral  artery both of which are widely patent.   Left ventriculography shows severe LV dysfunction, EF 20% with mild to  moderate MR, aortic pressure 131/61 mmHg, LV pressure 126/10 mmHg.   ASSESSMENT:  1.  Severe two-vessel coronary disease.  2.  Severe left ventricular dysfunction, ejection fraction 20%.  3.  Mother moderate mitral regurgitation.  4.  Diabetes mellitus.  5.  Hypertension.   PLAN:  1.  CVTS consult for possible CABG given two-vessel coronary disease and       diabetes mellitus.  2.  Aspirin daily.  3.  Hold metformin x48 hours.  4.  Add Coreg 3.125 mg b.i.d.  5.  Discontinue Atacand HCT and start Atacand 32 mg a day.  6.  Lasix 20 mg a day.      Armanda Magic, M.D.  Electronically Signed     TT/MEDQ  D:  08/29/2005  T:  08/29/2005  Job:  [id number]   cc:   Donia Guiles, M.D.  [address] [place]  [place] [address]  Fax: [phone]

## 2011-05-18 NOTE — Consult Note (Signed)
[name], [name]                ACCOUNT NO.:  [id number]   MEDICAL RECORD NO.:  [id number]          PATIENT TYPE:  OIB   LOCATION:  6526                         FACILITY:  [facility]   PHYSICIAN:  Kerin Perna, M.D.  DATE OF BIRTH:  1928-04-04   DATE OF CONSULTATION:  08/16/2005  DATE OF DISCHARGE:                                   CONSULTATION   REASON FOR CONSULTATION:  Severe coronary artery disease with class III  angina and CHF.   CHIEF COMPLAINT:  Shortness of breath and chest tightness.   HISTORY OF PRESENT ILLNESS:  I was asked to evaluate this 77 year old obese  white diabetic female for potential surgical coronary revascularization for  recently diagnosed severe coronary artery disease.  The patient has had a  recent history of exertional shortness of breath, decreased exercise  tolerance, and weakness.  She had some associated chest heaviness but no  discrete pain.  She denies any resting symptoms or orthopnea or nocturnal  dyspnea.  She has had associated risk factors of hypertension,  hyperlipidemia, and positive family history.  A BNP was checked by her  primary care physician and was elevated at 261.  She was evaluated by Dr.  Mayford Knife and felt to be a candidate for cardiac catheterization.  This was  performed today which demonstrated significant disease of the LAD diagonal  at 70-80% and high grade stenosis of the large circumflex of 80-85%.  Her  ejection fraction was reduced at 20-30% and left ventricular end diastolic  pressure was 20 mmHg.  There was apparent 2+ mitral regurgitation and no  evidence of aortic insufficiency.  Based on the patient's coronary anatomy,  her LV function, and her symptoms, it is felt that surgical  revascularization would be potentially a good option to treat her diabetic  coronary artery disease.  She is currently stable in the cath lab holding  unit.   PAST MEDICAL HISTORY:  1.  Diabetes mellitus.  2.  Hypertension.  3.   Hyperlipidemia.  4.  Osteoporosis.  5.  Allergy to penicillin, sulfa, and Fosamax.   PAST SURGICAL HISTORY:  Appendectomy, TAH, right shoulder repair in 1998,  right total hip replacement in 2001.   HOME MEDICATIONS:  Metformin 850 mg b.i.d., Glipizide 10 mg daily, Lotrel  5/20 mg b.i.d., Atacand with HCTZ 32/12.5 mg daily, Lipitor 40 mg daily,  Actos 30 mg daily, aspirin 81 mg daily, steroid 0.05 mg daily.   SOCIAL HISTORY:  The patient lives alone.  She is a retired Engineer, civil (consulting) and worked  at the [employer].  She does not smoke, she does not use alcohol.  Her daughter, Cleda Mccreedy, is her primary care giver.   FAMILY HISTORY:  Positive for cancer, positive for coronary artery disease,  hypertension, and diabetes.   REVIEW OF SYMPTOMS:  Constitutional review is negative for recent change in  weight, fever, or night sweats.  ENT review is negative for change in  vision, active dental problems, or difficulty swallowing.  Thoracic review  is negative for chest trauma, history of abnormal chest x-ray, productive  cough,  hemoptysis.  Cardiac review is positive for class 3 angina and CHF  and severe coronary artery disease with reduced LV function.  She denies  having had a heart murmur previously.  Abdominal review is negative for  change in bowel habits or blood per rectum.  She underwent a colonoscopy  recently which was negative.  She denies gallstones, hepatitis, or jaundice.  Urologic review is negative for kidney stones or UTI.  Hematologic review is  negative for bleeding disorder or blood transfusion.  Musculoskeletal review  is positive for osteoporosis and her orthopedic operations.  Neurologic  review is negative for stroke or seizure.  Vascular review is negative for  claudication, TIA, or DVT.  She does state she has some superficial  varicosities in her leg.  She does have leg cramps at night.   PHYSICAL EXAMINATION:  VITAL SIGNS:  The patient is 5 feet 2 inches,  weight 180 pounds, blood  pressure 130/80, pulse 90, respirations 18, temperature 98.5.  GENERAL:  Pleasant, elderly, overweight white female in the cath lab holding  unit in no distress.  She is breathing comfortably while supine.  HEENT:  Normocephalic, full EOMs, pharynx clear, dentition good.  NECK:  Without JVD, mass, or carotid bruits.  LYMPHATICS:  Reveal no palpable supraclavicular or axillary adenopathy.  LUNGS:  Clear, there is no thoracic deformity,  CARDIAC EXAM:  Regular rhythm with a soft 1/6 systolic murmur at the left  lower sternal border.  There is no S3 gallop.  ABDOMEN:  Obese, soft, nontender, without organomegaly.  EXTREMITIES:  Peripheral pulses are 2+ in all extremities.  She has some  superficial varicosities of her lower extremities, right greater than left.  SKIN:  With some actinic keratosis over the anterior chest, otherwise,  negative.  NEUROLOGIC:  Alert and oriented x 3 without focal motor deficit.   LABORATORY DATA:  BUN 18, creatinine 1.3, potassium 4.3, hematocrit 40.5,  white count 6.8, INR 1.08, PTT 34.  Chest x-ray pending.   IMPRESSION:  This patient has severe coronary artery disease with reduced LV  function.  She has rather small diabetic vessels.  Her best long term option  for preservation of LV function and improved survival would be bypass grafts  to the LAD, diagonal, and circumflex marginal.  I reviewed the operation in  detail with the patient.  There  does appear to be mitral regurgitation and a follow up 2D echo was ordered  for assessment of that finding at cath.  We would recommend scheduling  surgery for a Monday, August 21.  Pre-CABG Dopplers will be performed  tomorrow to assess for carotid disease.  Thank you for the consultation.      Kerin Perna, M.D.  Electronically Signed     PV/MEDQ  D:  08/16/2005  T:  08/16/2005  Job:  [id number]   cc:   Armanda Magic, M.D. [address]  [place], [place] [address]   Fax: [phone]   Donia Guiles, M.D.  [address] [place]  [place] [address]  Fax: [phone]

## 2011-05-18 NOTE — Op Note (Signed)
[name]  Patient:    [name], [name]                       MRN: [id number] Proc. Date: 11/25/00 Adm. Date:  [id number] Attending:  Alinda Deem                           Operative Report  PREOPERATIVE DIAGNOSIS:  Degenerative arthritis of the right hip.  POSTOPERATIVE DIAGNOSIS:  Degenerative arthritis of the right hip.  OPERATION:  Hybrid right total hip arthroplasty using a cemented #6 ODC stem with a 11 mm tip, #3 cement restrictor, NK +0 28 mm cobalt crown head, a 48 mm dual geometry cup with a 10 degree liner index posterior and superior.  SURGEON:  Alinda Deem, M.D.  ASSISTANT:   ______ P.A.C.  ANESTHESIA:  General endotracheal anesthesia.  ESTIMATED BLOOD LOSS:  200 cc  FLUID REPLACEMENT:   1 liter of crystalloid.  URINE OUTPUT:  200  INDICATIONS:  A 77 year old woman with bone-on-bone arthritic changes of  the right hip who has failed conservative treatment with anti-inflammatory medicines, use of the cane in the opposite hand, physical therapy, and observation for the last year or two.  She notes pain that prevents activities of daily living, awakens her at night, and has become intolerable despite the use of mild narcotics as well.  In light of the x-ray findings and the significance of her symptoms, total hip arthroplasty has been recommended and consented to by the patient accepting the risks of surgery.  DESCRIPTION OF PROCEDURE:  The patient was identified by arm band.  She was taken to the operating room in [facility] main hospital.  Appropriate monitors were attached and general endotracheal anesthesia induced with the patient in supine position.  She was then rolled to the left lateral decubitus position and fixed there with a ______ Loraine Leriche II pelvic clamp.  The right lower extremity was then prepped and draped in the usual sterile fashion from the ankle to the hemipelvis, and skin along the lateral hip was then  infiltrated with 20 cc of 0.5% Marcaine and epinephrine solution.  A posterolateral approach to the hip was then accomplished utilizing a 20 cm incision centered over the greater trochanter.  Small bleeders in the skin and subcutaneous tissue were identified and cauterized.  The iliotibial band was cut in line with the skin incision exposing the short external rotators and the piriformis which were tied with #2 Ethibond sutures and cut off their insertion.  The posterior aspect of the hip joint capsule was then developed into an acetabular base flap and also tied with #2 Ethibond sutures.  The hip was then flexed and internally rotated dislocating the arthritic bone-on-bone femoral head.  A standard neck cut was performed one fingerbreadth above the lesser trochanter with the power saw.  The hip was placed in neutral rotation and the proximal femur translocated anteriorly using a Hohmann retractor levering off the anterior column.  A spiked Cobra in the cotyloid notch and posterior superior and posterior inferior ring retractors were placed.  The labrum was removed in toto along with the soft tissue in the pulvinar.  The acetabulum was then sequentially reamed out to a 48 mm hemispheric basket reamer followed by a dual geometry cutter 48 mm and 45 degrees of abduction and 20 degrees of anteversion.  A 48 mm one-hole dual geometry cup was then hammered  into place, a central occluder placed, and then a 10 degree liner with the index posterior and superior was hammered into place as well.  The hip was then flexed and internally rotated exposing the proximal femur, and the proximal femur sequentially reamed out to a #6 tapered reamer.  This was followed by broaching up to a #6 broach, calcar milling to polish the calcar, and with the broach in place, a 25 mm base neck was applied with an NK +0 28 mm trial head.  Hip was taken through a range of motion.  Stability noted at 98 flexion, 50 of  internal rotation, full extension and external rotation of 30 degrees without difficulty. At this point, the hip was once again dislocated, the trial broach removed.  Femur size for #3 cement restrictor and the proximal femur pulse lavage cleaned, dried with sponges, and suctioned.  A double batch of polymethyl Simplex methacrylate cement with 1500 mg of Zinacef was then mixed and injected under pressure into the proximal femur followed by a #6 ODC stemp with a # 11 mm tip in about 25 to 30 degrees of anteversion.  Excess cement was removed, and the cement was allowed to cure followed by application of NK +0 28 mm L fit cobalt chrome head.  This was impacted into place and hip again reduced and stability noted to be excellent.  Once again, the wound was water picked clean.  The acetabular base and capsular flap, short external rotators, and piriforms were repaired back to the intertrochanteric crest through drill holes.  The iliotibial band was closed with running #1 Vicryl suture, the subcutaneous tissue with 0 and 2-0 undyed Vicryl suture, and the skin with running interlocking 3-0 nylon suture.  A dressing of Xeroform, 4 x 4 dressing, sponges, and Hypafix tape applied.  The patient was then awakened and taken to the recovery room without difficulty. DD:  11/25/00 TD:  11/25/00 Job: [id number]

## 2011-05-18 NOTE — Discharge Summary (Signed)
[place]  Patient:    [name], [name]                       MRN: [id number] Adm. Date:  [id number] Disc. Date: 12/05/00 Attending:  Herold Harms Dictator:   Mcarthur Rossetti. Angiulli, P.A. CC:         Alinda Deem, M.D.  Desma Maxim, M.D.   Discharge Summary  DISCHARGE DIAGNOSES: 1. Right total hip arthroplasty November 25, 2000. 2. Postoperative anemia. 3. Diabetes mellitus. 4. Hypertension. 5. Hyperlipidemia. 6. Urinary tract infection, resolved.  HISTORY OF PRESENT ILLNESS:  [age]-year-old white female admitted November 25, 2000, with right hip pain x 6 years.  No relief with conservative care. X-rays with end-stage osteoarthritis.  Underwent right total hip arthroplasty November 25, 2000, per Dr. Turner Daniels.  Placed on Coumadin for deep vein thrombosis prophylaxis, weightbearing as tolerated.  Postoperative anemia at 9.7.  No chest pain or shortness of breath.  Temperature 102 degrees.  Urinalysis with few bacteria, placed empirically on Cipro November 28, 2000, for suspect urinary tract infection.  She was close supervision for ambulation.  Latest INR of 1.6.  Hemoglobin 9.8.  Admitted for comprehensive rehabilitation program.  PAST MEDICAL HISTORY:  See discharge diagnoses.  PAST SURGICAL HISTORY: 1. Appendectomy. 2. Hysterectomy. 3. Right shoulder surgery.  ALLERGIES:  PENICILLIN, SULFA, DARVOCET, HORSE SERUM.  TOBACCO/ALCOHOL:  None.  PRIMARY M.D.:  Dr. Arvilla Market.  MEDICATIONS PRIOR TO ADMISSION: 1. Glucotrol 10 mg daily. 2. Lipitor 40 mg daily. 3. Actos 30 mg daily. 4. Lotrel 5-20 daily.  5. Hydrochlorothiazide 25 mg daily.  6. Glucophage 850 mg twice daily.  7. Aspirin 81 mg daily.  8. Estradiol.  SOCIAL HISTORY:  Lives alone in [place].  Independent prior to admission and driving.  One-level home, three steps to entry.  Niece in area who works.  HOSPITAL COURSE:  The patient did well while on  rehabilitation services with therapies initiated on a b.i.d. basis.  The following issues were followed during the patients rehabilitation course.  Pertaining to Mrs. Murrays right total hip arthroplasty, remained stable.  Surgical site healing nicely.  No signs of infection.  She would follow up with Dr. Turner Daniels for removal of sutures.  She was weightbearing as tolerated.  Total hip precautions.  She continued on Coumadin for deep vein thrombosis prophylaxis.  Venous Doppler studies prior to her discharge were negative.  She would complete Coumadin protocol.  Her postoperative hemoglobin remained stable at 9.6, hematocrit 28.8, with no bleeding episodes.  History of diabetes mellitus, with blood sugars 136, 125, 129.  She continued on combination regimen of Glucotrol, Glucophage, and Actos, as well as diabetic diet.  Her blood pressures remained controlled, without orthostatic changes, and she was continued on her Lotrel and hydrochlorothiazide.  She had completed a course of Cipro for urinary tract infection.  She was voiding without difficulty.  Denied any dysuria. Overall, for her functional mobility, she was ambulating independently on the rehabilitation unit, essentially supervision for activities of daily living, in dressing, grooming, and homemaking.  Overall, her strength and endurance greatly improved as she was encouraged in her overall progress.  She was discharged to home with home health therapies.  DISCHARGE MEDICATIONS:  1. Coumadin daily, with dose to be established at the time of discharge to     complete Coumadin protocol.  2. Norvasc 5 mg daily.  3. Lotensin 20 mg daily.  4. Hydrochlorothiazide 25 mg daily.  5. Avapro 300 mg daily.  6. Multivitamin daily.  7. Zocor 80 mg daily.  8. Estradiol 1 patch change every seven days, 0.05 mg.  9. Glucotrol 10 mg daily. 10. Glucophage 850 mg twice daily. 11. Actos 30 mg daily. 12. Potassium chloride 10 mEq daily. 13.  Trinsicon 1 capsule twice daily. 14. Tylox as needed for pain.  ACTIVITY:  Weightbearing as tolerated with walker.  DIET:  1800 calorie ADA.  SPECIAL INSTRUCTIONS:  Resume aspirin therapy after Coumadin completed.  Home health therapies as advised.  Ongoing regimen per home health nurse for prothrombin time.  To complete Coumadin protocol with results to Dr. Ellwood Dense. DD:  12/04/00 TD:  12/04/00 Job: [id number]

## 2011-05-18 NOTE — Discharge Summary (Signed)
NAMEJACYLN, [name]                ACCOUNT NO.:  [id number]   MEDICAL RECORD NO.:  [id number]          PATIENT TYPE:  INP   LOCATION:  2005                         FACILITY:  [facility]   PHYSICIAN:  Kerin Perna, M.D.  DATE OF BIRTH:  03/15/1928   DATE OF ADMISSION:  08/16/2005  DATE OF DISCHARGE:                                 DISCHARGE SUMMARY   ANTICIPATED DATE OF DISCHARGE:  September 03, 2005.   ADMISSION DIAGNOSIS:  Shortness of breath with abnormal Cardiolite.   DISCHARGE/SECONDARY DIAGNOSES:  1.  Severe two-vessel coronary artery disease.  2.  Moderate mitral regurgitation.  3.  Class III congestive heart failure.  4.  Severe left ventricular dysfunction with ejection fraction estimated at      25 to 35% per two-dimensional echocardiogram.  5.  Occluded right internal carotid artery.  6.  Postoperative altered mental status/toxic metabolic encephalopathy,      probable associated dysarthria, resolved.  7.  Postoperative mild pharyngeal dysphagia, improving.  8.  Postoperative acute blood loss anemia requiring transfusion.  9.  Postoperative thrombocytopenia, resolved.  10. Mild postoperative renal insufficiency, improving.  11. Postoperative urinary retention, resolved on Detrol.  12. Diabetes mellitus, type 2.  13. Osteoporosis.  14. Hypertension.  15. Hyperlipidemia.  16. History of appendectomy in 1949.  17. History of hysterectomy with bilateral salpingo-oophorectomy in 1976.  18. History of right total hip replacement in 2001.  19. History of left bundle branch block.  20. ALLERGIES TO PENICILLIN, SULFA, FOSAMAX, DARVOCET, and HORSE SERUM.   BRIEF HISTORY:  Ms. [name] is a 77 year old, obese, white, diabetic female,  who has had a recent history of exertional shortness of breath, decreased  exercise tolerance, and weakness.  She has some associated chest heaviness,  but no discrete pain.  She denied any resting symptoms, orthopnea, or  nocturnal dyspnea.  She  has associated risk factors of hypertension,  hyperlipidemia, and a positive family history.  A BNP was checked by her  primary care physician and was elevated at 261.  She was evaluated by Dr.  Mayford Knife and felt to be a candidate for cardiac catheterization.  This was  scheduled at [name] for August 16, 2005.   PROCEDURES/DIAGNOSTICS:  1.  August 16, 2005:  Left heart catheterization, coronary angiogram with      left ventriculography by Dr. Armanda Magic showing severe two-vessel      coronary disease, severe left ventricular dysfunction with ejection      fraction of 20%, moderate mitral regurgitation.  2.  August 17, 2005:  Two-D echocardiogram showing left ventricular size at      the upper limits of normal.  Overall left ventricular systolic function      was moderately to markedly decreased with ejection fraction estimated at      25 to 35%.  There was moderate diffuse left ventricular hypokinesis and      akinesis of the entire anteroseptal wall.  There was marked paradoxical      motion of the intraventricular septum consistent with a conduction      abnormality or  paced rhythm.  Aortic valve thickness was moderately      increased.  There was mild to moderate thickening of the mitral valve      involving the anterior more than the posterior leaflets without      significant restriction of the leaflet motion with known moderate sub-      myocardial involvement.  The mitral regurgitation grade was 2+, a mean      transmitral gradient was 6 mmHg in the mitral valve area by pressure.      Half-time was 4.68 cm/2.  The left atrium was mildly dilated.  3.  August 17, 2005:  Ankle-brachial indices greater than 1.0 on the right      and 0.98 on the left.  4.  August 17, 2005:  Carotid duplex followed by MR-angiography of the neck      without contrast showing a low marked attenuation of flow in the right      internal carotid artery without focal stenosis proximally, severe  distal      stenosis of the supraglenoid internal carotid artery.  The __________      outflow may be the root problem of slow flow, but a definite flow was      seen beyond the internal carotid artery termination, bilateral proximal      vertebral artery stenosis, right greater than left, and an irregular      narrowing in the distal vertebral arteries from the dural margin to the      VD junction, normal appearance of the left carotid artery.  5.  August 20, 2005:  Coronary artery bypass grafting x3 using a left      internal mammary artery to the left anterior descending, saphenous vein      graft to the ramus intermediate, saphenous vein graft to the circumflex      marginal, mitral valve repair using 24 mm Edwards Physio annuloplasty      ring, Model #4450, Serial [id number], and posterior commissuroplasty by Dr.      Kathlee Nations Trigt.  6.  August 20, 2005:  Intraoperative transesophageal echocardiogram by Dr.      Sheldon Silvan (see dictation by Dr. Ivin Booty).  7.  August 23 and August 27, 2005:  Head CT without contrast showing atrophy      with small, old, right, frontal, periventricular white matter infarcts,      but no acute intracranial abnormalities.  8.  August 26 and August 27, 2005:  Left upper arm PICC line insertion.  9.  August 27, 2005:  EEG with conclusions showing mild, diffuse slowing of      the background rhythms, a finding suggestive of diffuse, widespread,      cerebral dysfunction and consistent with a given history of a drowsy      and/or mildly encephalopathic or demented state.  Mild amplitude      asymmetry with amplitude on the right mildly suppressed compared to the      left, although no focal slowing and no definite abnormalities were seen      over the right hemisphere, and the clinical significance of this finding      was unclear.  No focal slowing was noted, and no epileptiform discharges      were seen. 10. August 28, 2005:  A bedside swallow study.  11.  August 29, 2005:  SEES study showing mild, mostly pharyngeal dysphagia.   HOSPITAL COURSE:  Ms. Nowotny was admitted to [facility]  on August 17, 2005, following cardiac catheterization finding severe two-vessel  coronary artery disease and moderate mitral regurgitation.  She was felt a  potential candidate for coronary revascularization with a possible mitral  valve repair.  She was evaluated by the cardiac surgeon, Dr. Kathlee Nations  Trigt, who felt that her best long-term option for preservation of LV  function and improved survival would be coronary artery bypass surgery with  mitral valve repair.  The surgery was scheduled for August 20, 2005.  In the  meantime, she underwent a two-D echocardiogram and pre-CABG Dopplers with  results as discussed above.  She was maintained on a heparin IV drip.  As  anticipated on August 21st, she was taken to the operating room for coronary  artery bypass graft surgery and mitral valve repair.  Her postoperative  course was complicated initially.  She remained in the surgical intensive  care unit through postoperative day #9 and was then transferred to a step-  down followed by a transfer to telemetry the following day.  At the time of  this dictation, she remains on Unit 2000 and it is anticipated she will  remain on this unit until discharge.  From a cardiovascular standpoint, she  remains stable.  She did require atrial pacing in the initial postoperative  period as well as __________ and dopamine drips, which were eventually  weaned.  She maintained a sinus rhythm and was started on Coreg and Altace  for her LV dysfunction.  At the time of this dictation, her systolic blood  pressures have been running between 100 and 120.  For the past several days,  her diastolic blood pressures have been ranging from 50 to 70.  She remained  on aspirin only for her mitral valve ring.  However, as early as  postoperative day #1, Ms. Korpi showed signs of  agitation and slurred  speech followed by a left facial droop, dysphagia, and the question of a  mild left upper extremity weakness.  Lovenox was started as well as Haldol  and Ativan.  Short time bilateral wrist restraints were also utilized.  Neurology was consulted and she eventually underwent two head CT scans and  an EEG with results as above.  Ultimately, it was felt that her agitation  was due to toxic metabolic encephalopathy.  She was eventually weaned from  Haldol and Ativan as well as restraints but did require a nighttime sitter  for several days.  Narcotics were also avoided.  By discharge, her agitation  had resolved and she remained alert and oriented.  Her dysarthria had also  cleared, but she still had mild pharyngeal dysphagia, which was followed by  Speech Therapy.  At the peak of her altered mental status, she was made NPO  and required short term TNA via a left upper arm PICC line.  The TNA was eventually weaned to a dysphagia-1 diet and then to a regular, carbohydrate-  modified diet with thin liquids and chopped meat along with full supervision  and medications with puree.  She was maintained on reflux precautions.  During her hospitalization, she was also evaluated by Physical Therapy due  to deconditioning.  Initial evaluation recommended subacute care versus home  with Home Health physical therapy depending on her progress.  Consequently,  a Rehab Medicine consult was ordered, and it was initially felt she would be  appropriate, but at this time it is now felt she will most likely be able to  go home with  Home Health physical therapy, occupational therapy, and speech  therapy.  Her daughter will be staying with her at discharge.  Her daughter,  who was not in, will also be staying with her at discharge.  Other issues  addressed during Ms. Chisum's hospitalization included mild  thrombocytopenia, which resolved without treatment, and postoperative acute  blood loss  anemia, which required one postoperative transfusion.  She was  also treated for urinary retention with Detrol.  She required a short course  of Diflucan for a Candidiasis, which Dr. Donata Clay noted on her abdominal  exam.  She also developed mild renal insufficiency, which improved after  stopping diuretic therapy for a mild postoperative fluid volume excess.  At  this time, her creatinine remains stable at 1.4.  Her diabetes mellitus was  initially managed with IV insulin per Glucomander protocol, which was  ultimately transitioned to Lantus insulin and later to her home regimen of  Actos and Glucotrol.  Metformin was avoided secondary to her renal  insufficiency.  At the time of this dictation, capillary blood glucose  readings are ranging primarily between 110 and 150.  By September 03, 2005,  postoperative day #13, Ms. Ellsworth is felt nearly ready for discharge.  At  this time, physical exam findings show her heart has a regular rate and  rhythm and maintaining sinus rhythm with a rate in the 70s to 90s.  Lung  sounds are clear.  Her abdominal exam is benign.  Her extremities show no  edema.  Her incisions are clean, dry, and intact without signs of infection.  Her current weight is 171.4 pounds, which is 3 pounds below her baseline.  Vital signs remain stable with a blood pressure of 107/60, she is afebrile,  with oxygen saturation 97% on room air.  She is alert and oriented.  She is  making progress with mobilization per cardiac rehab as well as physical  therapy.  She is intermittently using a rolling walker.  The patient reports  she has two walkers at home if needed.  Her pain is controlled on oral  medication.  Primary pain issues have involved her chronic back pain, but  she is overall comfortable.  Her external pacing wires and chest tube  sutures have been discontinued.  She is now voiding without difficulty and  bowels are functioning appropriately.  If there are no further  changes in her status over the next 24 to 48 hours, it is anticipated that she will be  discharged to home, most likely September 03, 2005.   CONSULTATIONS:  1.  Pharmacy.  2.  Cardiac Rehab Phase-1.  3.  Speech Therapy.  4.  Physical Therapy.  5.  Occupational Therapy (currently pending).  6.  Neurology, Dr. Marcelino Freestone.  7.  IV therapy.  8.  Rehabilitation Medicine.  9.  Nutrition.  10. Case Management.   LABORATORY DATA:  Most recent labs at the time of this dictation show a  white blood count of 8.2, hemoglobin 10.0, hematocrit 29.2, platelet count  318.  Sodium 135, potassium 4.2, chloride 107, CO2 21, glucose 120, BUN 31,  creatinine 1.4, calcium 8.7.  Pre-albumin decreased at 15.5, triglycerides  153, phosphorus 5.7, magnesium 2.1, total bilirubin 0.6, alkaline  phosphatase 65, SGOT 25, SGPT 26, total protein 5.6, total cholesterol of  173, TSH of 2.675, hemoglobin A1c is 6.0.   DISCHARGE MEDICATIONS:  1.  Aspirin 81 mg p.o. daily.  2.  Coreg 3.125 mg p.o. b.i.d.  3.  Altace 2.5 mg p.o. daily.  4.  Lipitor 40 mg p.o. daily.  5.  Glipizide-ER 10 mg p.o. daily.  6.  Actos 30 mg p.o. daily.  7.  Detrol-LA 4 mg p.o. daily.  8.  Estradiol 0.5 mg p.o. daily.  9.  Multivitamin p.o. daily.  10. Calcium 600 mg with Vitamin D p.o. b.i.d.  11. Ultram 50 mg 1 to 2 tablets p.o. q.6h p.r.n. pain.   DISCHARGE INSTRUCTIONS:  1.  She was instructed to avoid driving, or heavy lifting more than 10      pounds.  2.  She is to continue daily walking and breathing exercises.  3.  She is to follow a low fat, low salt, carbohydrate-modified diet.  4.  Until she is reevaluated by Speech Therapy, she should have full      supervision with meals, have her meats chopped, and take medications      with puree.  5.  She may shower and clean her incisions gently with mild soap and water.  6.  The [facility] office should be notified if she develops fever greater than      101, or redness or  drainage from her incision site.   FOLLOWUP:  1.  She is to follow up with Dr. Kathlee Nations Trigt at the [facility] office in      approximately three weeks.  The office will contact her regarding a      specific appointment date and time.  2.  She is to call [phone] to schedule a two-week followup with Dr. Armanda Magic.  She is to have a chest x-ray at this appointment, and she was      instructed to bring her chest x-ray with her to her appointment with Dr.      Donata Clay.      Jerold Coombe, P.A.      Kerin Perna, M.D.  Electronically Signed    AWZ/MEDQ  D:  09/02/2005  T:  09/02/2005  Job:  [id number]   cc:   Donia Guiles, M.D.  [address] [place]  [place] [address]  Fax: [phone]   Gustavus Messing. Orlin Hilding, M.D.  Fax: [phone]

## 2011-05-18 NOTE — Consult Note (Signed)
[name], [name]                ACCOUNT NO.:  [id number]   MEDICAL RECORD NO.:  [id number]          PATIENT TYPE:  INP   LOCATION:  2302                         FACILITY:  [facility]   PHYSICIAN:  Gustavus Messing. Orlin [name], M.D.DATE OF BIRTH:  August 16, 1928   DATE OF CONSULTATION:  DATE OF DISCHARGE:                                   CONSULTATION   DATE OF CONSULTATION:  August 24, 2005   CHIEF COMPLAINT:  Confusion, dysarthria, left-sided weakness.   HISTORY OF PRESENT ILLNESS:  [name] is a 77 year old white woman who  was admitted on August 17 with shortness of breath and chest tightness and  found to have multivessel coronary artery disease and a poor ejection  fraction of 20% to 30%.  Prior to CABG, her workup included Doppler's which  suggested that there was some severe disease in the right carotid system.  MR angiogram showed what appeared to be more distal disease just past the  cavernous sinus.  It is unclear to me whether it is felt to be an occlusion  or stenosis.  At any rate, I cannot find that she ever had an angiogram done  and the status of the intracranial collaterals is unclear to me.  On August  21, she underwent mitral valve annuloplasty and 2 vessel CABG and since then  has remained largely confused, intermittently agitated, has continued to  receive Haldol and 1 Ativan daily although an attempt to discontinue other  CNS active drugs was made.  She was been noted to have a mild left facial  droop and some mild left-sided weakness, as well as significant dysarthria  and dysphagia.  It is unclear if she is also aphasic.  CT was done 2 days  postoperative on the 23rd without any acute abnormalities, but her symptoms  are persisting.   REVIEW OF SYSTEMS:  Unobtainable.   PAST MEDICAL HISTORY:  1.  Coronary artery disease, status post 3 vessel CABG on August 20, 2005.      She has mitral valve disease with a poor ejection fraction of 20% to      30%, status post  annuloplasty on August 20, 2005.  She has now diagnosed      right distal ICA stenosis or occlusion.  Whether or not that is an old      finding or not is not clear to me from the chart, but it appears to be      new and the extent of it not entirely understood.  2.  She has hyperlipidemia.  3.  Obesity.  4.  Hypertension.  5.  Type 2 diabetes.  6.  Osteoporosis.   MEDICATIONS:  1.  Multiple types of insulin.  2.  Protonix.  3.  Tylenol.  4.  Lovenox.  5.  Lasix.  6.  Lidocaine.  7.  Lopressor.  8.  Ativan.  9.  Haldol.  10. Morphine.  11. Dopamine.  12. Nitroglycerin.  13. Nipride.  14. Primacor.  15. Oxycodone.  16. Aspirin.  17. Dulcolax.  18. Glipizide.  19. Lipitor.  Some of these are p.r.n.  Some of them are p.o. and on hold, but I do not  know how much of which of these she is getting currently.   ALLERGIES:  PENICILLIN, SULFA, DARVOCET, HORSE SERUM and FOSAMAX.   SOCIAL HISTORY:  She lived by herself, was a retired Engineer, civil (consulting), no alcohol or  cigarette use.   FAMILY HISTORY:  Positive for coronary artery disease, hypertension,  diabetes.   PHYSICAL EXAMINATION:  VITAL SIGNS:  Temperature is 98.1, pulse 80, blood  pressure is 130/70, respiratory rate 18.  HEENT:  Head is normocephalic, atraumatic.  NECK:  Without bruits.  NEUROLOGIC:  Non-verbal to me.  She does not answer questions or follow  commands.  She will grunt a little.  She apparently will speak usually  fairly clearly in terms of grammar and content, although she is sometimes  confused and is usually quite dysarthric.  There is no blink to threat on  either side.  She has full extraocular movements.  Her ocular cephalic  maneuver is with pain.  She has an asymmetric grimace with flat nasal labial  fold relatively speaking.  MOTOR EXAM:  She moves all 4 extremities spontaneously, as well as to pain,  but she does seem to move the right upper extremity more frequently and  purposefully than the left.   Deep tendon reflexes are  1+, toes are equivocal.  Coordination:  She is not cooperative.  Sensory:  She withdraws to pain in all 4 extremities.   CT from August 22 shows no acute abnormalities; a few scattered small vessel  lesions.   LABORATORY DATA:  She is anemic and her platelets are 110.  Otherwise,  fairly unremarkable.   IMPRESSION:  Despite the negative CT from 2 days ago, her exam is concerning  for right brain ischemia.  Assessment in light of the known right eye CA  stenosis or occlusion without clear identification of the collateral flow.  There is probably also some contribution to toxic metabolic encephalopathy  as she is still getting daily Ativan and Haldol.   RECOMMENDATIONS:  MRI of the head if it is not contraindicated by the  annuloplasty ring.  Otherwise, we may need to repeat a head CT if we are  unable to get the MRI to try and document whether there is some acute  ischemia.  We will arrange for an EEG on Monday.  Would recommend  discontinuing the Haldol and the Ativan, as soon as it is feasible from a  medical perspective.      [name], M.D.  Electronically Signed     CAW/MEDQ  D:  08/24/2005  T:  08/26/2005  Job:  [id number]

## 2011-05-18 NOTE — Discharge Summary (Signed)
[facility]  Patient:    [name], [name]                       MRN: [id number] Adm. Date:  [id number] Disc. Date: [id number] Attending:  Herold Harms Dictator:   Dorthula Matas, P.A.-C.                           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  INTRODUCTION:  The patient is a 77 year old female in for orthopedic treatment of end-stage DJD of the right hp.  HISTORY OF PRESENT ILLNESS:  The patient has a six-year history of pain in her right hip followed for conservative treatment.  Has failed conservative management with physical therapy, nonsteroidal anti-inflammatories, cane use, and rest.  The patients pain is now interfering with sleep, activities of daily living, cannot walk greater than one city block without having to rest. The patient wishes to proceed with a total hip arthroplasty, understanding the risks versus the benefits.  X-rays show positive femoral collapse with arthritic changes on plain films.  SOCIAL HISTORY:  No ethanol use, no tobacco use, no IV drug abuse.  She is a retired Engineer, civil (consulting).  She is widowed.  She lives alone.  PAST MEDICAL HISTORY:  Childhood diseases.  Adult disease history: Hypertension, diabetes, and DJD.  FAMILY HISTORY:  Mother deceased at age 43 with history of cancer.  Father deceased at age 52 with a brain tumor.  PAST SURGICAL HISTORY:  Appendectomy, hysterectomy, shoulder scope, no difficulties with GET.  PRIMARY CARE PHYSICIAN:  Dr. Arvilla Market.  ALLERGIES:  PENICILLIN causes hives or rash and SULFA also causing hives or rash and HORSE SERUM which causes hives.  MEDICATIONS AT TIME OF ADMISSION:  1. Atacand 32 mg one p.o. q.d.  2. Glucotrol 10 mg one p.o. q.d.  3. Lipitor 40 mg one p.o. q.d.  4. Actos 30 mg one p.o. q.d.  5. Lotrel 5/20 mg one p.o. q.d.  6. Hydrochlorothiazide 25 mg one p.o. q.d.  7. Glucophage 85  mg one p.o. b.i.d.  8. Aspirin 81 mg one p.o. q.d.  9. Estradiol 0.05 mg one patch per week. 10. Multivitamin q.d.  REVIEW OF SYSTEMS:  Use of glasses.  Denies any dentures, changes in hearing. No severe headache history.  The patient denies shortness of breath except with exertion.  Does have an a.m. cough.  Denies chest pain.  No bleeding per mouth or rectum.  No recent UTIs.  Free of bleeding or psychiatric disorders.  PHYSICAL EXAMINATION:  GENERAL APPEARANCE:  The patient is 64 inches tall.  She weighs 175 pounds.  VITAL SIGNS:  Blood pressure is 180/96.  HEENT:  Head is normocephalic, atraumatic.  Eyes:  PERRLA, EOMI.  Ears:  TMs are clear.  NECK:  Supple, range of motion is good.  No polyps.  Throat is clear.  RESPIRATORY:  Clear to auscultation and percussion.  CARDIOVASCULAR:  Regular rate and rhythm with no murmurs, rubs, or gallops.  ABDOMEN:  Soft, nontender, no masses, bowel sounds 2+.  GENITOURINARY:  Deferred.  NEUROLOGIC:  Alert and oriented x 3.  Normal speech pattern.  Cranial nerves II-XII grossly intact.  ORTHOPEDIC:  The right hip had forward flexion of 100 degrees, internal rotation 40 degrees, external rotation 40 degrees (all with pain).  Abduction and adduction as well as internal and external  rotation 1+, painful with foot tap negative.  Neurovascularly intact to light tough and pinprick distally. DTRs intact.  PREOPERATIVE LABORATORY AND X-RAY DATA:  CBC, CMET, chest x-ray, EKG, PT, and PTT were all within normal limits.  HOSPITAL COURSE:  On the day of admission the patient was seen in the operating room at [facility] where she underwent a right total hip arthroplasty using cemented No. 6 ODC stem with an 11-mm tip, No. 3 cement restrictor, and NK+0 28-mm cobalt chrome head, a 48-mm dual Jones cup with a 10-degree liner indexed posterior and superior.  The patient was placed on perioperative antibiotics for the first 48  hours.  She was placed on postoperative Coumadin prophylaxis with a target INR of 1.5 to 2.  She was placed on a PCA pain control pump for postoperative pain, and physical therapy was begun the first day of the surgery.  The first postop day, the patient was awake and alert, was drinking water without difficulty, and denied any vomiting.  CBGs running from 150 to 170, T-MAX 101.2 range and 100.8.  Urine output 1400.  Dressing was dry.  She was otherwise neurovascularly intact. Hemoglobin 10.2.  PT of 13.7.  She continued physical therapy, weightbearing as tolerated with total hip precautions, and rehab consult was obtained. Rehabilitation and physical medicine consult report indicated that she would be appropriate for a comprehensive inpatient rehab admission when bed availability became a possibility.  Postoperative day #2, the patient was without complaint.  She was voiding without difficulties, no nausea or vomiting, no coughing.  T-MAX 100.2 ranging from 99.5.  Hemoglobin 9.1, INR of 1.4.  Dressing was otherwise dry.  Wound was benign without erythema.  Lungs were clear.  She was otherwise stable orthopedically.  She continued physical therapy.  PCA was discontinued.  Chest x-ray was performed as well as UA, and incentive spirometer was encouraged.  Third postoperative day, the patient was without complaint except for some moderate fatigue.  T-MAX 101 ranged from 99. Vital signs were stable.  She was otherwise alert and oriented.  Hemoglobin 9.8, INR of 1.6.  Labs drawn on November 28 showed no active disease on chest x-ray, WBC of 8.6, and UA with a few bacteria.  Dressing remained dry, and wound otherwise benign.  She began Cipro for UTI and was okayed for transfer to rehab, and a bed did become available.  On postoperative day 3, she was transferred to the rehab care unit.  At time of her transfer she was tolerating a regular diet, she was making steady progress with  physical therapy, weightbearing as tolerated with total hip precautions.  She should  continue using a walker as needed.  She will continue on oral pain meds and should return to see Dr. Turner Daniels within one week of her discharge from the rehab medicine unit. DD:  01/06/01 TD:  01/06/01 Job: [id number]

## 2011-05-18 NOTE — H&P (Signed)
NAMEANNALISA, COLONNA NO.:  [id number]   MEDICAL RECORD NO.:  [id number]          PATIENT TYPE:  EMS   LOCATION:  MAJO                         FACILITY:  [facility]   PHYSICIAN:  Meade Maw, M.D.    DATE OF BIRTH:  [birth year]   DATE OF ADMISSION:  10/06/2005  DATE OF DISCHARGE:                                HISTORY & PHYSICAL   INDICATION FOR ADMISSION:  Dyspnea.   HISTORY:  [name] is a very pleasant 77 year old female who has  recently underwent coronary artery bypass surgery and mitral valve ring  repair on August 21.  Her postoperative course was complicated by toxic  metabolic encephalopathy, mild pharyngeal dysphagia, anemia requiring  transfusion, postoperative thrombocytopenia, and mild renal insufficiency.  She was discharged to home on September 4.  Since her discharge to home she  has made minimal progress.  She did have physical therapy following her for  a short period of time.  She did not enroll in cardiac rehabilitation.  She  presents today at the insistence of her daughter.  She notes that she has  been having increasing shortness of breath for approximately four days.  There has been no orthopnea.  She has mild ankle edema, but only in the  evenings.  She has had no cough, no fevers, no chills.  She denies chest  pain.  She has been compliant with her current medical regimen.  She has had  no presyncope, syncope, or tachy arrhythmia.   PAST MEDICAL HISTORY:  1.  As noted above, significant for severe two vessel coronary artery      disease with moderate mitral regurgitation status post coronary artery      bypass surgery and mitral valve ring repair.  2.  LV dysfunction with an ejection fraction of 25-30%.  3.  Occluded right internal carotid artery.  4.  Diabetes mellitus type 2.  5.  Osteoporosis.  6.  Hypertension.  7.  Dyslipidemia.   PAST SURGICAL HISTORY:  1.  Appendectomy in 1949.  2.  Bilateral salpingo-oophorectomy in  1976.  3.  Right hip replacement in 2001.   ALLERGIES:  PENICILLIN, SULFA, FOSAMAX, DARVOCET, and HORSE SERUM.   MEDICATIONS AT TIME OF PRESENTATION:  1.  Aspirin 81 mg daily.  2.  Coreg 3.125 mg daily.  3.  Altace 2.5 mg daily.  4.  Lipitor 40 mg daily.  5.  Glipizide ER 10 mg daily.  6.  Actos 30 mg daily.  7.  Detrol LA 4 mg daily.  8.  Estradiol 0.5 mg daily.  9.  Multivitamin daily.   SOCIAL HISTORY:  The patient lives alone.  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  She does not smoke.  No history of alcohol use.  Her  daughter is very supportive.   REVIEW OF SYSTEMS:  As noted above.   PHYSICAL EXAMINATION:  GENERAL:  Elderly female appearing her stated age in  mild to moderate respiratory distress.  VITAL SIGNS:  Blood pressure 146/86, heart rate 102, respiratory rate 20, O2  saturation 95%  on room air.  HEENT:  No neck vein distention.  She is noted to have a right carotid  bruit.  PULMONARY:  Breath sounds are equal and clear to auscultation.  No use of  accessory muscles.  CARDIOVASCULAR:  Regular rate and rhythm.  There is no audible murmurs  noted.  The sternum incision is well healed.  ABDOMEN:  Obese, benign, nontender.  EXTREMITIES:  Vein varicosity.  No peripheral edema is noted.  SKIN:  Warm and dry.  NEUROLOGIC:  Nonfocal.   LABORATORY DATA:  pH 7.32 with pCO2 37, bicarbonate 19.  Hemoglobin 13,  hematocrit 41.  Sodium 140, potassium 4.7, creatinine 1.1.  Platelet count  is pending.  CK-MB is 2.6.  Troponin I is less than 0.05.  Myoglobin is 154.  BNP is pending.   IMPRESSION:  36.  A 77 year old female who has recently underwent coronary artery bypass      surgery and mitral valve repair.  She has known left ventricular      dysfunction with an ejection fraction of 25-30%.  Will admit the patient      for diuresis.  Continue her home medications of aspirin, Coreg, and      Altace.  Will additionally obtain a BNP.  2.  Hypertension.   Blood pressure is marginally controlled.  Will continue      to monitor.  3.  Diabetes mellitus.  Will continue with the patient's home medication.  4.  Dyslipidemia.  Will continue with Lipitor.  Her LDL goal is less than      70.  5.  Wheezing.  Unable to eliminate possible asthma as an etiology.  Will      write for Xopenex nebulizers p.r.n.      Meade Maw, M.D.  Electronically Signed     HP/MEDQ  D:  10/06/2005  T:  10/06/2005  Job:  [id number]   cc:   Kerin Perna, M.D.  [address]  [place]  [place] [address]   Armanda Magic, M.D.  Fax: [phone]   Donia Guiles, M.D.  Fax: [phone]

## 2011-05-18 NOTE — Discharge Summary (Signed)
[name], DANTUONO                ACCOUNT NO.:  [id number]   MEDICAL RECORD NO.:  [id number]          PATIENT TYPE:  INP   LOCATION:  2024                         FACILITY:  [facility]   PHYSICIAN:  [name], M.D.     DATE OF BIRTH:  05/07/1928   DATE OF ADMISSION:  12/07/2005  DATE OF DISCHARGE:  12/08/2005                                 DISCHARGE SUMMARY   CHIEF COMPLAINT AND REASON FOR ADMISSION:  Miss [name] is a 77 year old  female patient of Dr. Mayford [name] who has known 3 vessel coronary artery disease,  history of CABG in August 2006, she has also undergone mitral valve repair  and mitral valve ring at the same time as her CABG due to problems with  moderate mitral regurgitation. She also has severe LV dysfunction, EF 25% on  the monitor November 2006. She was admitted to the hospital by Dr. Amil [name]  for placement of prophylactic AICD due to history of ischemic cardiomyopathy  with EF less than 30%.   ADMISSION DIAGNOSES:  1.  Ischemic cardiomyopathy with ejection fraction 25%, recent MUGA status      post revascularization and mitral valve repair 3 months prior.  2.  Prior solitary episodic congestive heart failure nonsymptomatic.  3.  Left bundle branch block electrocardiogram.  4.  Dyslipidemia.  5.  Hypertension controlled.  6.  Diabetes mellitus.  7.  Persistent moderate mitral regurgitation status post mitral valve ring      and repair, soft murmur.   HOSPITAL COURSE:  The patient was admitted via short stay on the date of  admission for Dr. Amil [name]. She was taken to the cath lab where she underwent  an implantation of a single chamber ICD Medtronic device in trust D154VRC,  serial [id number], RV lead 6949, serial #[id number]. Please refer to Dr.  Amil [name]' operative report for additional details. It was noted that during  insertion I am able to induce V tach with 50 hertz burst. The patient  tolerated the procedure well. Post ICD interrogation on the 9th showed no  change and no V tach or __________episodes and normal function and Dr.  Mayford [name] rounded on the patient that morning. The patient's vital signs were  stable. BP 112/54, heart rate 63. She had no complaints. Lungs were clear.  No evidence of heart failure. Chest x-ray showed no evidence of pneumothorax  and the incision site was clean, dry, and intact. She was deemed appropriate  for discharge home.   FINAL DISCHARGE DIAGNOSES:  1.  Ischemic cardiomyopathy with ejection fraction 25%.  2.  Status post insertion of single chamber ICD prophylactically.  3.  Asymptomatic coronary artery disease status post coronary artery bypass      graft in August 2006.  4.  Persistent murmur status post mitral valve ring in August 2006.  5.  Hypertension controlled.   DISCHARGE MEDICATIONS:  1.  Glipizide 2 mg daily.  2.  Actos 30 mg daily.  3.  Lipitor 40 mg daily.  4.  Aspirin 225 mg daily.  5.  Calcium with vitamin D daily.  6.  Stop Estradiol  patch.  7.  Altace 5 mg daily.  8.  Coreg 6.25 mg b.i.d.  9.  Detrol LA 4 mg daily.  10. Furosemide 20 mg daily.  11. Potassium 20 mEq daily.  12. Zetia 2 mg daily.   DIET:  Heart healthy.   ACTIVITY:  Wound care. Please see the pacemaker defibrillator discharge  instruction sheet.   FOLLOW UP:  Follow-up appointment is with Dr. Amil [name] on December 19 at 9  a.m. After that he will be instructed on when to follow-up with Dr. Mayford [name].  Additional information, please return to cardiac rehab and follow routine as  before if you are currently enrolled.      [name], M.D.  Electronically Signed    ALE/MEDQ  D:  03/04/2006  T:  03/05/2006  Job:  [id number]   cc:   [name], Dr.

## 2011-05-18 NOTE — Procedures (Signed)
EEG NUMBER:  [id number]   REQUESTING PHYSICIAN:  Dr. Marcelino Freestone   ATTENDING PHYSICIAN:  Dr. Kathlee Nations Trigt   CLINICAL HISTORY:  This is a portable EEG done in the ICU without photic  stimulation or hyperventilation. The patient is described as awake and  drowsy. This is a 77 year old woman lethargic with slurred speech, with  encephalopathy following surgery. EEG is performed for evaluation of  encephalopathy.   DESCRIPTION:  The dominant rhythm of this tracing is a moderate amplitude  theta rhythm of 7-8 Hz which appears diffusely without abnormal asymmetry  and demonstrates higher amplitude on the left than on the right a little bit  beyond what would be expected of normal variant. No focal slowing is noted.  No epileptiform discharges are seen. The patient appeared to remain in the  awake state throughout the recording. Photic stimulation and  hyperventilation were not performed. Single channel devoted to EKG revealed  sinus rhythm throughout with a rate of approximately 96 beats per minute.   CONCLUSIONS:  Abnormal study due to the presence of:  1.  Mild diffuse slowing of the background rhythms, a finding suggestive of      diffuse widespread cerebral dysfunction and consistent with a given      history of a drowsy and/or mildly encephalopathic or demented state.  2.  Mild amplitude asymmetry with amplitudes on the right mildly suppressed      compared to the left although no focal slowing and no definite      abnormalities are seen over the right hemisphere, and the clinical      significance of this finding is unclear.   Again, no focal slowing is noted and no epileptiform discharges are seen.      Michael L. Thad Ranger, M.D.  Electronically Signed     EAV:WUJW  D:  08/27/2005 18:01:06  T:  08/28/2005 10:49:21  Job #:  [id number]

## 2011-05-18 NOTE — Op Note (Signed)
NAME:  [name], [name]                          ACCOUNT NO.:  [id number]   MEDICAL RECORD NO.:  [id number]                   PATIENT TYPE:  AMB   LOCATION:  ENDO                                 FACILITY:  [facility]   PHYSICIAN:  Danise Edge, M.D.                DATE OF BIRTH:  28-Sep-1928   DATE OF PROCEDURE:  10/01/2002  DATE OF DISCHARGE:                                 OPERATIVE REPORT   PROCEDURE PERFORMED:  Screening colonoscopy.   REFERRING PHYSICIAN:  Desma Maxim, M.D.   ENDOSCOPIST:  Charolett Bumpers, M.D.   INDICATIONS FOR PROCEDURE:  The patient is a 76 year old female born June 07, 1928.  [name] is scheduled to undergo her first screening colonoscopy  with polypectomy to prevent colon cancer.  Her August 1998 flexible  proctosigmoidoscopy revealed colonic diverticulosis.   I discussed with the patient the complications associated with colonoscopy  and polypectomy including a 15 per 100,000 risk of bleeding and 4 per 1000  risk of colon perforation requiring surgical repair.  The patient has signed  the operative permit.   ALLERGIES:  HORSE SERUM, PENICILLIN, SULFA.   CHRONIC MEDICATIONS:  Atacand with hydrochlorothiazide, Glucotrol XL,  Lipitor, Actos, Lotrel, Metformin, aspirin, estrogen patch, multivitamin.   PREMEDICATION:  Fentanyl 50 mcg, Versed 5 mg.   PAST MEDICAL HISTORY:  Type 2 diabetes mellitus with hypertension,  hypercholesterolemia.   PAST SURGICAL HISTORY:  Appendectomy, total abdominal hysterectomy with  bilateral salpingo-oophorectomy, right shoulder surgery, right hip  replacement surgery.   FAMILY HISTORY:  Negative for colon cancer.   INSTRUMENT USED:  Pediatric Olympus video colonoscope.   DESCRIPTION OF PROCEDURE:  After obtaining informed consent, the patient was  placed in the left lateral decubitus position.  I administered intravenous  fentanyl and intravenous Versed to achieve conscious sedation for the  procedure.  The  patient's blood pressure, oxygen saturations and cardiac  rhythm were monitored throughout the procedure and documented in the medical  record.   Anal inspection was normal.  Digital rectal exam was normal.  The pediatric  Olympus video colonoscope was introduced into the rectum and easily advanced  to the rectum and advanced to the cecum.  Colonic preparation for the exam  today was excellent.   Rectum:  Normal.   Sigmoid colon and descending colon:  Left colonic diverticulosis.   Splenic flexure:  Normal.   Transverse colon:  Normal.   Hepatic flexure:  Normal.   Ascending colon:  Normal.   Ileocecal valve and cecum:  Normal.   ASSESSMENT:  Left colonic diverticulosis; otherwise normal proctocolonoscopy  to the cecum.  No endoscopic evidence for the presence of colorectal  neoplasia.  Danise Edge, M.D.    MJ/MEDQ  D:  10/01/2002  T:  10/02/2002  Job:  [id number]   cc:   Desma Maxim, M.D.

## 2011-05-18 NOTE — Op Note (Signed)
[name], [name]                ACCOUNT NO.:  [id number]   MEDICAL RECORD NO.:  [id number]          PATIENT TYPE:  INP   LOCATION:  2302                         FACILITY:  [facility]   PHYSICIAN:  Sheldon Silvan, M.D.      DATE OF BIRTH:  07/31/1928   DATE OF PROCEDURE:  08/20/2005  DATE OF DISCHARGE:                                 OPERATIVE REPORT   PROCEDURE:  Intraoperative transesophageal echocardiography (TEE).   DESCRIPTION OF PROCEDURE:  [name] was brought to the operating room by  Dr. Donata Clay for mitral valve repair and coronary artery bypass grafting.  It was felt that use of TEE would be helpful for both diagnostic and  monitoring purposes.   After satisfactory induction of general anesthesia, including endotracheal  intubation, the [employer] O[employer]ipl[employer] TEE probe was sheathed and  lubricated appropriately.  It was passed through the oropharynx without  difficulty.   The heart was imaged, and the left ventricle was seen to be dilated.  There  was thinning of the LV wall.  The contraction was hypokinetic in all  segments, with perhaps the septal area slightly less than the others.  There  was no evidence of thrombus.   The mitral valve was examined, and the appearance of the valve in several  views appeared to be somewhat tethered.  On color flow exam, it was noted  that there was 3 to 4+ regurgitation centrally.  The left atrial appendage  was imaged and was found to be free of clot or smoke.   The interatrial septum was imaged using color flow techniques.  There was no  PFO noted.  The right ventricle appeared normal.  The tricuspid valve was  imaged and showed only trace to 1+ regurgitation on color flow exam.   The aortic valve was imaged and was noted to be tricuspid with only mild  sclerosis.  There was trace regurgitation noted centrally on color flow  exam.   The patient was placed on cardiopulmonary bypass by Dr. Donata Clay, and  repair of the mitral  valve as well as placement of a ring was accomplished.  In addition, there were four coronary artery bypasses performed.   On weaning the patient from bypass, TEE was used to assess the left  ventricle which appeared unchanged from pre-bypass images.  The mitral valve  ring was seen to be in good position.  There was also only trace to 1+  regurgitation of the mitral valve noted on color flow exam in a long axis  view.  The aortic valve was unchanged.   At a point after protamine reversal had been given and platelets had been  started, the patient suffered a period of hypotension that was not well-  explained, except for the possibility of a platelet reaction.  Epinephrine  was given,  and she recovered from this nicely, without being placed on the pump again.  Reexamination of the heart with TEE showed no changes.  The chest was  closed, and the patient remained stable.   The probe was removed, and the patient was taken  to the SICU in good  condition.      Sheldon Silvan, M.D.  Electronically Signed     DC/MEDQ  D:  08/21/2005  T:  08/21/2005  Job:  [id number]   cc:   Anesthesia Dept.

## 2011-06-06 ENCOUNTER — Other Ambulatory Visit (INDEPENDENT_AMBULATORY_CARE_PROVIDER_SITE_OTHER): Payer: Medicare Other

## 2011-06-06 DIAGNOSIS — I6529 Occlusion and stenosis of unspecified carotid artery: Secondary | ICD-10-CM

## 2011-06-14 NOTE — Procedures (Unsigned)
CAROTID DUPLEX EXAM  INDICATION:  Follow up carotid artery disease.  HISTORY: Diabetes:  Yes. Cardiac:  CABG in 2006. Hypertension:  Yes. Smoking:  No. Previous Surgery:  No. CV History:  Patient is currently asymptomatic. Amaurosis Fugax No, Paresthesias No, Hemiparesis No.                                      RIGHT             LEFT Brachial systolic pressure: Brachial Doppler waveforms: Vertebral direction of flow:                          Antegrade DUPLEX VELOCITIES (cm/sec) CCA peak systolic                                     68 ECA peak systolic                                     75 ICA peak systolic                                     68 ICA end diastolic                                     21 PLAQUE MORPHOLOGY:                                    Heterogenous PLAQUE AMOUNT:                                        Moderate PLAQUE LOCATION:                                      ICA, ECA   IMPRESSION:  Known right internal carotid artery occlusion.  Left side, no hemodynamically significant stenosis.  Intimal thickening within the left common carotid artery.  Study is stable compared to previous.        ___________________________________________ Larina Earthly, M.D.  OD/MEDQ  D:  06/06/2011  T:  06/06/2011  Job:  [id number]

## 2011-07-02 ENCOUNTER — Other Ambulatory Visit (HOSPITAL_COMMUNITY): Payer: Self-pay | Admitting: Family Medicine

## 2011-07-02 DIAGNOSIS — Z1231 Encounter for screening mammogram for malignant neoplasm of breast: Secondary | ICD-10-CM

## 2011-08-02 ENCOUNTER — Encounter: Payer: Medicare Other | Admitting: *Deleted

## 2011-08-06 ENCOUNTER — Ambulatory Visit (HOSPITAL_COMMUNITY)
Admission: RE | Admit: 2011-08-06 | Discharge: 2011-08-06 | Disposition: A | Discharge status: Home / Self Care | Payer: Medicare Other | Source: Ambulatory Visit | Attending: Family Medicine | Admitting: Family Medicine

## 2011-08-06 DIAGNOSIS — Z1231 Encounter for screening mammogram for malignant neoplasm of breast: Secondary | ICD-10-CM | POA: Insufficient documentation

## 2011-08-07 ENCOUNTER — Encounter: Payer: Self-pay | Admitting: *Deleted

## 2011-08-22 ENCOUNTER — Other Ambulatory Visit: Payer: Self-pay

## 2011-08-22 ENCOUNTER — Encounter: Payer: Self-pay | Admitting: Internal Medicine

## 2011-08-22 ENCOUNTER — Ambulatory Visit (INDEPENDENT_AMBULATORY_CARE_PROVIDER_SITE_OTHER): Payer: Medicare Other | Admitting: *Deleted

## 2011-08-22 DIAGNOSIS — I428 Other cardiomyopathies: Secondary | ICD-10-CM

## 2011-08-22 DIAGNOSIS — I5022 Chronic systolic (congestive) heart failure: Secondary | ICD-10-CM

## 2011-08-22 DIAGNOSIS — I509 Heart failure, unspecified: Secondary | ICD-10-CM

## 2011-08-22 LAB — REMOTE ICD DEVICE
BATTERY VOLTAGE: 2.99 V
CHARGE TIME: 9.589 s
FVT: 0
RV LEAD AMPLITUDE: 6.4326 mv
RV LEAD IMPEDENCE ICD: 704 Ohm
TZAT-0001FASTVT: 1
TZAT-0001SLOWVT: 1
TZAT-0001SLOWVT: 2
TZAT-0002FASTVT: NEGATIVE
TZAT-0004SLOWVT: 8
TZAT-0004SLOWVT: 8
TZAT-0012FASTVT: 200 ms
TZAT-0013SLOWVT: 2
TZAT-0013SLOWVT: 2
TZAT-0018FASTVT: NEGATIVE
TZAT-0020SLOWVT: 1.5 ms
TZAT-0020SLOWVT: 1.5 ms
TZON-0003VSLOWVT: 400 ms
TZON-0004SLOWVT: 16
TZON-0004VSLOWVT: 20
TZST-0001FASTVT: 4
TZST-0001FASTVT: 5
TZST-0001FASTVT: 6
TZST-0001SLOWVT: 3
TZST-0001SLOWVT: 5
TZST-0002FASTVT: NEGATIVE
TZST-0002FASTVT: NEGATIVE
TZST-0003SLOWVT: 35 J
VF: 0

## 2011-08-22 NOTE — Progress Notes (Signed)
ICD remote 

## 2011-09-04 ENCOUNTER — Encounter: Payer: Self-pay | Admitting: *Deleted

## 2011-11-22 ENCOUNTER — Encounter: Payer: Self-pay | Admitting: Internal Medicine

## 2011-11-23 ENCOUNTER — Ambulatory Visit (INDEPENDENT_AMBULATORY_CARE_PROVIDER_SITE_OTHER): Payer: Medicare Other | Admitting: *Deleted

## 2011-11-23 DIAGNOSIS — I428 Other cardiomyopathies: Secondary | ICD-10-CM

## 2011-11-27 NOTE — Progress Notes (Signed)
Remote icd check  

## 2011-11-29 ENCOUNTER — Encounter: Payer: Self-pay | Admitting: *Deleted

## 2012-02-21 ENCOUNTER — Encounter: Payer: Self-pay | Admitting: Internal Medicine

## 2012-02-21 ENCOUNTER — Ambulatory Visit (INDEPENDENT_AMBULATORY_CARE_PROVIDER_SITE_OTHER): Payer: Medicare Other | Admitting: *Deleted

## 2012-02-21 DIAGNOSIS — I428 Other cardiomyopathies: Secondary | ICD-10-CM

## 2012-02-25 LAB — REMOTE ICD DEVICE
BATTERY VOLTAGE: 2.92 V
BRDY-0002RV: 40 {beats}/min
CHARGE TIME: 9.779 s
RV LEAD AMPLITUDE: 5.1 mv
TOT-0006: 20061208000000
TZAT-0001FASTVT: 1
TZAT-0011SLOWVT: 10 ms
TZAT-0012FASTVT: 200 ms
TZAT-0012SLOWVT: 200 ms
TZAT-0012SLOWVT: 200 ms
TZAT-0013SLOWVT: 2
TZAT-0013SLOWVT: 2
TZAT-0018SLOWVT: NEGATIVE
TZAT-0018SLOWVT: NEGATIVE
TZAT-0019SLOWVT: 8 V
TZAT-0019SLOWVT: 8 V
TZAT-0020SLOWVT: 1.5 ms
TZAT-0020SLOWVT: 1.5 ms
TZON-0003SLOWVT: 350 ms
TZON-0003VSLOWVT: 400 ms
TZON-0005SLOWVT: 12
TZST-0001FASTVT: 2
TZST-0001FASTVT: 3
TZST-0001FASTVT: 4
TZST-0001SLOWVT: 4
TZST-0001SLOWVT: 5
TZST-0002FASTVT: NEGATIVE
TZST-0002FASTVT: NEGATIVE
TZST-0003SLOWVT: 35 J
TZST-0003SLOWVT: 35 J

## 2012-02-28 NOTE — Progress Notes (Signed)
Remote icd check  

## 2012-03-04 ENCOUNTER — Encounter: Payer: Self-pay | Admitting: *Deleted

## 2012-05-12 ENCOUNTER — Encounter: Payer: Self-pay | Admitting: Internal Medicine

## 2012-05-12 ENCOUNTER — Ambulatory Visit (INDEPENDENT_AMBULATORY_CARE_PROVIDER_SITE_OTHER): Payer: Medicare Other | Admitting: Internal Medicine

## 2012-05-12 VITALS — BP 138/62 | HR 58 | Resp 18 | Ht 63.0 in | Wt 165.8 lb

## 2012-05-12 DIAGNOSIS — I5022 Chronic systolic (congestive) heart failure: Secondary | ICD-10-CM

## 2012-05-12 DIAGNOSIS — I10 Essential (primary) hypertension: Secondary | ICD-10-CM

## 2012-05-12 DIAGNOSIS — I2510 Atherosclerotic heart disease of native coronary artery without angina pectoris: Secondary | ICD-10-CM

## 2012-05-12 DIAGNOSIS — I509 Heart failure, unspecified: Secondary | ICD-10-CM

## 2012-05-12 LAB — ICD DEVICE OBSERVATION
BATTERY VOLTAGE: 2.89 V
BRDY-0002RV: 40 {beats}/min
CHARGE TIME: 9.779 s
DEV-0020ICD: NEGATIVE
PACEART VT: 0
RV LEAD AMPLITUDE: 4.4 mv
TOT-0002: 1
TOT-0006: 20061208000000
TZAT-0005SLOWVT: 88 pct
TZAT-0005SLOWVT: 91 pct
TZAT-0011SLOWVT: 10 ms
TZAT-0011SLOWVT: 10 ms
TZAT-0012SLOWVT: 200 ms
TZAT-0012SLOWVT: 200 ms
TZAT-0018SLOWVT: NEGATIVE
TZAT-0018SLOWVT: NEGATIVE
TZAT-0019FASTVT: 8 V
TZAT-0019SLOWVT: 8 V
TZAT-0020FASTVT: 1.5 ms
TZON-0003SLOWVT: 350 ms
TZON-0004SLOWVT: 16
TZON-0005SLOWVT: 12
TZST-0001FASTVT: 2
TZST-0001FASTVT: 3
TZST-0001SLOWVT: 4
TZST-0001SLOWVT: 6
TZST-0002FASTVT: NEGATIVE
TZST-0002FASTVT: NEGATIVE
TZST-0002FASTVT: NEGATIVE
TZST-0003SLOWVT: 25 J
TZST-0003SLOWVT: 35 J
TZST-0003SLOWVT: 35 J
VENTRICULAR PACING ICD: 0.29 pct

## 2012-05-12 NOTE — Assessment & Plan Note (Signed)
Currently euvolemic on exam. Normal defibrillator function She has a sprint fidelis RV lead without impedance issues or elevated SIC counts.  She has a magnet and has been instructed upon how to use it should she receive inappropriate shocks for failed RV lead previously. We had a long discussion today about her ICD and RV lead.  She is very clear that she wishes to avoid any further procedures in the future.  She wishes to consider turning her ICD off if she develops problems with her fidelis lead.  She is unlikely to want generator replacement once she is at [facility]. Given her advanced age, I think that these are reasonable requests. We will continue current therapy at this time and follow. See Arita Miss Art report No changes today

## 2012-05-12 NOTE — Assessment & Plan Note (Signed)
Stable No change required today  

## 2012-05-12 NOTE — Patient Instructions (Signed)
Your physician wants you to follow-up in: 12 months with Dr Allred You will receive a reminder letter in the mail two months in advance. If you don't receive a letter, please call our office to schedule the follow-up appointment.   Remote monitoring is used to monitor your Pacemaker of ICD from home. This monitoring reduces the number of office visits required to check your device to one time per year. It allows us to keep an eye on the functioning of your device to ensure it is working properly. You are scheduled for a device check from home on 08/14/12. You may send your transmission at any time that day. If you have a wireless device, the transmission will be sent automatically. After your physician reviews your transmission, you will receive a postcard with your next transmission date.   

## 2012-05-12 NOTE — Progress Notes (Signed)
PCP: [name], MD, MD Primary Cardiologist:  [name] is a 78 y.o. female with a h/o CAD, ischemic CM, and chronic systolic dysfunction sp ICD (MDT) by Dr Amil Amen for primary prevention of sudden death who presents today for follow-up- in the Electrophysiology device clinic.   The patient reports doing very well since having her ICD implanted and remains very active despite her age.  She has a Medtronic Fidelis [id number] RV lead but has had no issues with this.  Today, she  denies symptoms of palpitations, chest pain, shortness of breath, orthopnea, PND, lower extremity edema, dizziness, presyncope, syncope, or neurologic sequela.  She is primarily limited by arthritis.  The patientis tolerating medications without difficulties and is otherwise without complaint today.   Past Medical History  Diagnosis Date  . Coronary artery disease   . Ischemic cardiomyopathy     s/p ICD (MDT) by Dr Amil Amen 2006  . Carotid stenosis   . Diabetes mellitus   . Hypertension   . Mitral regurgitation   . Dyslipidemia   . Cerebrovascular disease     extracranial  . Osteoporosis   . Chronic systolic dysfunction of left ventricle     Past Surgical History  Procedure Date  . Cardiac defibrillator placement 2006    by Dr Amil Amen (MDT) she has a [phone] fidelis lead  . Cataract extraction   . Mitral valve repair   . Appendectomy   . Coronary artery bypass graft 2006  . Salpingoophorectomy 1976    bilateral  . Righ hip repla   . Total hip arthroplasty 2001    right    History   Social History  . Marital Status: Widowed    Spouse Name: N/A    Number of Children: N/A  . Years of Education: N/A   Occupational History  . retired Engineer, civil (consulting)    Social History Main Topics  . Smoking status: Never Smoker   . Smokeless tobacco: Not on file  . Alcohol Use: No  . Drug Use: No  . Sexually Active: Not on file   Other Topics Concern  . Not on file   Social History Narrative  . No narrative  on file    Family History  Problem Relation Age of Onset  . Coronary artery disease      family hx of  . Hypertension      family hx of  . Diabetes      family hx of    Allergies  Allergen Reactions  . Alendronate Sodium   . Clonidine Derivatives   . Darvocet (Propoxyphene-Acetaminophen)   . Horse-Derived Products   . Penicillins   . Sulfa Drugs Cross Reactors   . Vicodin (Hydrocodone-Acetaminophen)     Current Outpatient Prescriptions  Medication Sig Dispense Refill  . Acetaminophen (TYLENOL EXTRA STRENGTH PO) Take 1-2 tablets by mouth every 6 (six) hours as needed.        Marland Kitchen amLODipine (NORVASC) 5 MG tablet Take 5 mg by mouth daily.        Marland Kitchen aspirin 325 MG EC tablet Take 325 mg by mouth daily.        . Calcium Carbonate-Vitamin D (CALTRATE 600+D) 600-400 MG-UNIT per tablet Take 1 tablet by mouth 2 (two) times daily.        . carvedilol (COREG) 12.5 MG tablet Take 12.5 mg by mouth 2 (two) times daily with a meal.        . Cholecalciferol (VITAMIN D) 2000 UNITS tablet Take 2,000  Units by mouth daily.        . cyanocobalamin 2000 MCG tablet Take 2,000 mcg by mouth daily.        Marland Kitchen ezetimibe-simvastatin (VYTORIN) 10-20 MG per tablet Take 1 tablet by mouth at bedtime.        . fish oil-omega-3 fatty acids 1000 MG capsule Take 2 g by mouth 2 (two) times daily.        . furosemide (LASIX) 40 MG tablet Take 20 mg by mouth daily.        Marland Kitchen glipiZIDE (GLUCOTROL) 10 MG 24 hr tablet Take 20 mg by mouth daily.        Marland Kitchen losartan (COZAAR) 100 MG tablet Take 100 mg by mouth daily.        . Multiple Vitamins-Minerals (CENTRUM SILVER PO) Take 2 tablets by mouth daily.        Marland Kitchen oxybutynin (DITROPAN-XL) 10 MG 24 hr tablet Take 10 mg by mouth daily.        . ramipril (ALTACE) 10 MG capsule Take 10 mg by mouth daily.         Physical Exam: Filed Vitals:   05/12/12 0957  BP: 138/62  Pulse: 58  Resp: 18  Height: 5\' 3"  (1.6 m)  Weight: 165 lb 12.8 oz (75.206 kg)    GEN- The patient is well  appearing but elderly, alert and oriented x 3 today.   Head- normocephalic, atraumatic Eyes-  Sclera clear, conjunctiva pink Ears- hearing intact Oropharynx- clear Neck- supple, no JVP Lymph- no cervical lymphadenopathy Lungs- Clear to ausculation bilaterally, normal work of breathing Chest- ICD pocket is well healed Heart- Regular rate and rhythm, no murmurs, rubs or gallops, PMI not laterally displaced GI- soft, NT, ND, + BS Extremities- no clubbing, cyanosis,  +trace edema  ICD interrogation- reviewed in detail today,  See PACEART report  Assessment and Plan:

## 2012-07-01 ENCOUNTER — Other Ambulatory Visit (HOSPITAL_COMMUNITY): Payer: Self-pay | Admitting: Family Medicine

## 2012-07-01 DIAGNOSIS — Z1231 Encounter for screening mammogram for malignant neoplasm of breast: Secondary | ICD-10-CM

## 2012-08-08 ENCOUNTER — Ambulatory Visit (HOSPITAL_COMMUNITY)
Admission: RE | Admit: 2012-08-08 | Discharge: 2012-08-08 | Disposition: A | Discharge status: Home / Self Care | Payer: Medicare Other | Source: Ambulatory Visit | Attending: Family Medicine | Admitting: Family Medicine

## 2012-08-08 DIAGNOSIS — Z1231 Encounter for screening mammogram for malignant neoplasm of breast: Secondary | ICD-10-CM

## 2012-08-14 ENCOUNTER — Ambulatory Visit (INDEPENDENT_AMBULATORY_CARE_PROVIDER_SITE_OTHER): Payer: Medicare Other | Admitting: *Deleted

## 2012-08-14 ENCOUNTER — Encounter: Payer: Self-pay | Admitting: Internal Medicine

## 2012-08-14 DIAGNOSIS — I5022 Chronic systolic (congestive) heart failure: Secondary | ICD-10-CM

## 2012-08-14 DIAGNOSIS — I509 Heart failure, unspecified: Secondary | ICD-10-CM

## 2012-08-15 LAB — REMOTE ICD DEVICE
BATTERY VOLTAGE: 2.82 V
CHARGE TIME: 10.26 s
DEV-0020ICD: NEGATIVE
PACEART VT: 0
RV LEAD AMPLITUDE: 2.4 mv
TOT-0001: 1
TZAT-0001FASTVT: 1
TZAT-0001SLOWVT: 2
TZAT-0011SLOWVT: 10 ms
TZAT-0011SLOWVT: 10 ms
TZAT-0012FASTVT: 200 ms
TZAT-0018FASTVT: NEGATIVE
TZAT-0018SLOWVT: NEGATIVE
TZAT-0018SLOWVT: NEGATIVE
TZAT-0019FASTVT: 8 V
TZAT-0019SLOWVT: 8 V
TZAT-0019SLOWVT: 8 V
TZAT-0020FASTVT: 1.5 ms
TZON-0003SLOWVT: 350 ms
TZON-0004SLOWVT: 16
TZST-0001FASTVT: 2
TZST-0001FASTVT: 4
TZST-0001FASTVT: 6
TZST-0001SLOWVT: 4
TZST-0002FASTVT: NEGATIVE
TZST-0002FASTVT: NEGATIVE
TZST-0002FASTVT: NEGATIVE
TZST-0003SLOWVT: 35 J
TZST-0003SLOWVT: 35 J
TZST-0003SLOWVT: 35 J
VENTRICULAR PACING ICD: 3.21 pct
VF: 0

## 2012-08-20 ENCOUNTER — Encounter: Payer: Self-pay | Admitting: *Deleted

## 2012-08-25 ENCOUNTER — Encounter (HOSPITAL_COMMUNITY): Payer: Self-pay | Admitting: *Deleted

## 2012-08-25 ENCOUNTER — Emergency Department (HOSPITAL_COMMUNITY)
Admission: EM | Admit: 2012-08-25 | Discharge: 2012-08-25 | Disposition: A | Discharge status: Home / Self Care | Payer: Medicare Other | Source: Home / Self Care | Attending: Family Medicine | Admitting: Family Medicine

## 2012-08-25 DIAGNOSIS — S81009A Unspecified open wound, unspecified knee, initial encounter: Secondary | ICD-10-CM

## 2012-08-25 DIAGNOSIS — S81812A Laceration without foreign body, left lower leg, initial encounter: Secondary | ICD-10-CM

## 2012-08-25 MED ORDER — MUPIROCIN 2 % EX OINT: TOPICAL_OINTMENT | Freq: Three times a day (TID) | CUTANEOUS | Status: AC

## 2012-08-25 NOTE — ED Notes (Signed)
Pt  Reports  She  Felled     About  4  Days  Ago  And  Sustained  A   Lac  To   l  Lower leg   The  Bleeding  Has  subsided  She  Did  Not  Black out  She  Is  At this   Time  Awake  And  Alert  And  Oriented   Her  Skin is  Warm  Dry

## 2012-08-27 NOTE — ED Provider Notes (Signed)
History     CSN: [id number]  Arrival date & time 08/25/12  1558   First MD Initiated Contact with Patient 08/25/12 1624      Chief Complaint  Patient presents with  . Fall    (Consider location/radiation/quality/duration/timing/severity/associated sxs/prior treatment) HPI Comments: 78 year old female with history of diabetes and hypertension among other comorbidities. Here complaining of a laceration in her anterior left lower leg. Describes as she tripped while organizing her garage heating her left lower leg against a wooden object and causing a laceration. Incident happened about 4 hours ago. Bleeding has self resolved. Patient did not fall to the ground or lose consciousness. Denies trauma to any other body areas. Denies dizziness or other mentation changes as the cause of her accident. Denies chest pain or palpitations. She [facility] and was told to come here for evaluation of her wound". Patient states that she likely had a tetanus vaccine last time in 2006 when she had an implantable pacer placed. States she has a history of serum sickness.    Past Medical History  Diagnosis Date  . Coronary artery disease   . Ischemic cardiomyopathy     s/p ICD (MDT) by Dr Amil Amen 2006  . Carotid stenosis   . Diabetes mellitus   . Hypertension   . Mitral regurgitation   . Dyslipidemia   . Cerebrovascular disease     extracranial  . Osteoporosis   . Chronic systolic dysfunction of left ventricle     Past Surgical History  Procedure Date  . Cardiac defibrillator placement 2006    by Dr Amil Amen (MDT) she has a [phone] fidelis lead  . Cataract extraction   . Mitral valve repair   . Appendectomy   . Coronary artery bypass graft 2006  . Salpingoophorectomy 1976    bilateral  . Righ hip repla   . Total hip arthroplasty 2001    right    Family History  Problem Relation Age of Onset  . Coronary artery disease      family hx of  . Hypertension      family hx of  .  Diabetes      family hx of    History  Substance Use Topics  . Smoking status: Never Smoker   . Smokeless tobacco: Not on file  . Alcohol Use: No    OB History    Grav Para Term Preterm Abortions TAB SAB Ect Mult Living                  Review of Systems  Constitutional: Negative for fever and chills.  Skin:       As per HPI  Neurological: Negative for dizziness and syncope.  All other systems reviewed and are negative.    Allergies  Alendronate sodium; Clonidine derivatives; Darvocet; Horse-derived products; Penicillins; Sulfa drugs cross reactors; and Vicodin  Home Medications   Current Outpatient Rx  Name Route Sig Dispense Refill  . TYLENOL EXTRA STRENGTH PO Oral Take 1-2 tablets by mouth every 6 (six) hours as needed.      Marland Kitchen AMLODIPINE BESYLATE 5 MG PO TABS Oral Take 5 mg by mouth daily.      . ASPIRIN 325 MG PO TBEC Oral Take 325 mg by mouth daily.      Marland Kitchen CALCIUM CARBONATE-VITAMIN D 600-400 MG-UNIT PO TABS Oral Take 1 tablet by mouth 2 (two) times daily.      Marland Kitchen CARVEDILOL 12.5 MG PO TABS Oral Take 12.5  mg by mouth 2 (two) times daily with a meal.      . VITAMIN D 2000 UNITS PO TABS Oral Take 2,000 Units by mouth daily.      . CYANOCOBALAMIN 2000 MCG PO TABS Oral Take 2,000 mcg by mouth daily.      Marland Kitchen EZETIMIBE-SIMVASTATIN 10-20 MG PO TABS Oral Take 1 tablet by mouth at bedtime.      . OMEGA-3 FATTY ACIDS 1000 MG PO CAPS Oral Take 2 g by mouth 2 (two) times daily.      . FUROSEMIDE 40 MG PO TABS Oral Take 20 mg by mouth daily.      Marland Kitchen GLIPIZIDE ER 10 MG PO TB24 Oral Take 20 mg by mouth daily.      Marland Kitchen LOSARTAN POTASSIUM 100 MG PO TABS Oral Take 100 mg by mouth daily.      . CENTRUM SILVER PO Oral Take 2 tablets by mouth daily.      Marland Kitchen MUPIROCIN 2 % EX OINT Topical Apply topically 3 (three) times daily. 22 g 0  . OXYBUTYNIN CHLORIDE ER 10 MG PO TB24 Oral Take 10 mg by mouth daily.      Marland Kitchen RAMIPRIL 10 MG PO CAPS Oral Take 10 mg by mouth daily.        BP 140/73  Pulse  72  Temp 98.7 F (37.1 C) (Oral)  Resp 16  SpO2 97%  Physical Exam  Nursing note and vitals reviewed. Constitutional: She is oriented to person, place, and time. She appears well-developed and well-nourished. No distress.  HENT:  Head: Normocephalic and atraumatic.  Eyes: EOM are normal. Pupils are equal, round, and reactive to light.  Neck: Neck supple. No JVD present.  Cardiovascular: Normal rate, regular rhythm and normal heart sounds.   Pulmonary/Chest: Breath sounds normal.  Neurological: She is alert and oriented to person, place, and time.  Skin:       There is a flap laceration over left distal tibia. Laceration involved the superficial skin that appears very thin. subcutaneus fat layer intact at the botton. Hemostatic with serous pink transudate. Clean. Minimally tender, Mild erythema and echymosis around. No significant swelling.     ED Course  LACERATION REPAIR Performed by: Sharin Grave Authorized by: Sharin Grave Consent: Verbal consent obtained. Risks and benefits: risks, benefits and alternatives were discussed Consent given by: patient Patient understanding: patient states understanding of the procedure being performed Patient consent: the patient's understanding of the procedure matches consent given Body area: lower extremity Location details: left lower leg Laceration length: 2 cm Foreign bodies: no foreign bodies Tendon involvement: superficial Preparation: Patient was prepped and draped in the usual sterile fashion. Irrigation solution: saline Skin closure: Steri-Strips Technique: simple Dressing: non-adhesive packing strip Patient tolerance: Patient tolerated the procedure well with no immediate complications.   (including critical care time)  Labs Reviewed - No data to display No results found.   1. Laceration of left lower leg       MDM  Flap type laceration in this elder very thin skin. Wound was cleaned and treated with  sterile strips and dry non-adherent dressing. Prescribe mupirocin. Wound care instructions discussed with patient and provided in writing. Asked to return if redness swelling or drainage.        Sharin Grave, MD 08/29/12 [phone]

## 2012-09-24 ENCOUNTER — Encounter: Payer: Self-pay | Admitting: Vascular Surgery

## 2012-10-09 ENCOUNTER — Other Ambulatory Visit: Payer: Self-pay | Admitting: *Deleted

## 2012-10-09 DIAGNOSIS — I6529 Occlusion and stenosis of unspecified carotid artery: Secondary | ICD-10-CM

## 2012-10-13 ENCOUNTER — Encounter: Payer: Self-pay | Admitting: Neurosurgery

## 2012-10-14 ENCOUNTER — Other Ambulatory Visit (INDEPENDENT_AMBULATORY_CARE_PROVIDER_SITE_OTHER): Payer: Medicare Other | Admitting: *Deleted

## 2012-10-14 ENCOUNTER — Ambulatory Visit (INDEPENDENT_AMBULATORY_CARE_PROVIDER_SITE_OTHER): Payer: Medicare Other | Admitting: Neurosurgery

## 2012-10-14 ENCOUNTER — Encounter: Payer: Self-pay | Admitting: Neurosurgery

## 2012-10-14 VITALS — BP 148/67 | HR 67 | Resp 18 | Ht 62.0 in | Wt 163.7 lb

## 2012-10-14 DIAGNOSIS — I6529 Occlusion and stenosis of unspecified carotid artery: Secondary | ICD-10-CM

## 2012-10-14 NOTE — Addendum Note (Signed)
Addended by: Sharee Pimple on: 10/14/2012 10:53 AM   Modules accepted: Orders

## 2012-10-14 NOTE — Progress Notes (Signed)
[facility]  Carotid Office Note  CC: Annual carotid surveillance Referring Physician: Early  History of Present Illness: 78 year old female patient of Dr. Arbie Cookey with no history of carotid intervention. The patient denies signs or symptoms of CVA, TIA, amaurosis fugax or any neural deficit. The patient does report some "weakness" when she tries walking a distance but does not describe claudication or rest pain moreover a intolerance to activity due to generalized weakness.  Past Medical History  Diagnosis Date  . Coronary artery disease   . Ischemic cardiomyopathy     s/p ICD (MDT) by Dr Amil Amen 2006  . Carotid stenosis   . Diabetes mellitus   . Hypertension   . Mitral regurgitation   . Dyslipidemia   . Cerebrovascular disease     extracranial  . Osteoporosis   . Chronic systolic dysfunction of left ventricle   . Fractured shoulder 2009    Right shoulder Fx. from a fall    ROS: [x]  Positive   [ ]  Denies    General: [ ]  Weight loss, [ ]  Fever, [ ]  chills Neurologic: [ ]  Dizziness, [ ]  Blackouts, [ ]  Seizure [ ]  Stroke, [ ]  "Mini stroke", [ ]  Slurred speech, [ ]  Temporary blindness; [ ]  weakness in arms or legs, [ ]  Hoarseness Cardiac: [ ]  Chest pain/pressure, [ ]  Shortness of breath at rest [ ]  Shortness of breath with exertion, [ ]  Atrial fibrillation or irregular heartbeat Vascular: [ ]  Pain in legs with walking, [ ]  Pain in legs at rest, [ ]  Pain in legs at night,  [ ]  Non-healing ulcer, [ ]  Blood clot in vein/DVT,   Pulmonary: [ ]  Home oxygen, [ ]  Productive cough, [ ]  Coughing up blood, [ ]  Asthma,  [ ]  Wheezing Musculoskeletal:  [ ]  Arthritis, [ ]  Low back pain, [ ]  Joint pain Hematologic: [ ]  Easy Bruising, [ ]  Anemia; [ ]  Hepatitis Gastrointestinal: [ ]  Blood in stool, [ ]  Gastroesophageal Reflux/heartburn, [ ]  Trouble swallowing Urinary: [ ]  chronic Kidney disease, [ ]  on HD - [ ]  MWF or [ ]  TTHS, [ ]  Burning with urination, [ ]  Difficulty  urinating Skin: [ ]  Rashes, [ ]  Wounds Psychological: [ ]  Anxiety, [ ]  Depression   Social History History  Substance Use Topics  . Smoking status: Never Smoker   . Smokeless tobacco: Not on file  . Alcohol Use: No    Family History Family History  Problem Relation Age of Onset  . Coronary artery disease      family hx of  . Hypertension      family hx of  . Diabetes      family hx of  . Cancer Mother     Raj Janus   . Heart disease Mother   . Heart disease Sister   . Cancer Sister     Allergies  Allergen Reactions  . Alendronate Sodium   . Clonidine Derivatives   . Darvocet (Propoxyphene-Acetaminophen)   . Horse-Derived Products   . Penicillins   . Sulfa Drugs Cross Reactors   . Vicodin (Hydrocodone-Acetaminophen)     Current Outpatient Prescriptions  Medication Sig Dispense Refill  . Acetaminophen (TYLENOL EXTRA STRENGTH PO) Take 1-2 tablets by mouth every 6 (six) hours as needed.        Marland Kitchen amLODipine (NORVASC) 5 MG tablet Take 5 mg by mouth daily.        Marland Kitchen aspirin 325 MG EC tablet Take 325 mg by mouth  daily.        . Calcium Carbonate-Vitamin D (CALTRATE 600+D) 600-400 MG-UNIT per tablet Take 1 tablet by mouth 2 (two) times daily.        . carvedilol (COREG) 12.5 MG tablet Take 12.5 mg by mouth 2 (two) times daily with a meal.        . Cholecalciferol (VITAMIN D) 2000 UNITS tablet Take 2,000 Units by mouth daily.        . cyanocobalamin 2000 MCG tablet Take 2,000 mcg by mouth daily.        Marland Kitchen ezetimibe-simvastatin (VYTORIN) 10-20 MG per tablet Take 1 tablet by mouth at bedtime.        . fish oil-omega-3 fatty acids 1000 MG capsule Take 2 g by mouth 2 (two) times daily.        . furosemide (LASIX) 40 MG tablet Take 20 mg by mouth daily.        Marland Kitchen glipiZIDE (GLUCOTROL) 10 MG 24 hr tablet Take 20 mg by mouth daily.        Marland Kitchen glucose blood test strip daily.      . hydrALAZINE (APRESOLINE) 25 MG tablet Twice daily.      . Lancets 30G MISC       . losartan (COZAAR) 100  MG tablet Take 100 mg by mouth daily.        . Multiple Vitamins-Minerals (CENTRUM SILVER PO) Take 2 tablets by mouth daily.        Marland Kitchen oxybutynin (DITROPAN-XL) 10 MG 24 hr tablet Take 10 mg by mouth daily.        . ramipril (ALTACE) 10 MG capsule Take 10 mg by mouth daily.          Physical Examination  Filed Vitals:   10/14/12 0922  BP: 148/67  Pulse: 67  Resp:     Body mass index is 29.94 kg/(m^2).  General:  WDWN in NAD Gait: Normal HEENT: WNL Eyes: Pupils equal Pulmonary: normal non-labored breathing , without Rales, rhonchi,  wheezing Cardiac: RRR, without  Murmurs, rubs or gallops; Abdomen: soft, NT, no masses Skin: no rashes, ulcers noted  Vascular Exam Pulses: 2+ radial pulses bilaterally Carotid bruits: Carotid pulses heard on the left, known right occlusion Extremities without ischemic changes, no Gangrene , no cellulitis; no open wounds;  Musculoskeletal: no muscle wasting or atrophy   Neurologic: A&O X 3; Appropriate Affect ; SENSATION: normal; MOTOR FUNCTION:  moving all extremities equally. Speech is fluent/normal  Non-Invasive Vascular Imaging CAROTID DUPLEX 10/14/2012  Right ICA 0ccluded stenosis Left ICA 20 - 39 % stenosis   ASSESSMENT/PLAN: Asymptomatic patient with mild left ICA stenosis and right occlusion. The patient will followup in one year with repeat carotid duplex. The patient's questions were encouraged and answered, she is in agreement with this plan.  Lauree Chandler ANP   Clinic MD: Early

## 2012-11-17 ENCOUNTER — Ambulatory Visit (INDEPENDENT_AMBULATORY_CARE_PROVIDER_SITE_OTHER): Payer: Medicare Other | Admitting: *Deleted

## 2012-11-17 ENCOUNTER — Encounter: Payer: Self-pay | Admitting: Internal Medicine

## 2012-11-17 DIAGNOSIS — I5022 Chronic systolic (congestive) heart failure: Secondary | ICD-10-CM

## 2012-11-17 DIAGNOSIS — Z95810 Presence of automatic (implantable) cardiac defibrillator: Secondary | ICD-10-CM

## 2012-11-17 DIAGNOSIS — I509 Heart failure, unspecified: Secondary | ICD-10-CM

## 2012-11-21 LAB — REMOTE ICD DEVICE
CHARGE TIME: 10.26 s
DEV-0020ICD: NEGATIVE
RV LEAD AMPLITUDE: 2 mv
RV LEAD IMPEDENCE ICD: 704 Ohm
TOT-0001: 1
TOT-0002: 1
TOT-0006: 20061208000000
TZAT-0001FASTVT: 1
TZAT-0001SLOWVT: 1
TZAT-0001SLOWVT: 2
TZAT-0002FASTVT: NEGATIVE
TZAT-0004SLOWVT: 8
TZAT-0004SLOWVT: 8
TZAT-0005SLOWVT: 88 pct
TZAT-0005SLOWVT: 91 pct
TZAT-0011SLOWVT: 10 ms
TZAT-0012SLOWVT: 200 ms
TZAT-0012SLOWVT: 200 ms
TZAT-0013SLOWVT: 2
TZAT-0013SLOWVT: 2
TZAT-0018SLOWVT: NEGATIVE
TZAT-0018SLOWVT: NEGATIVE
TZON-0003SLOWVT: 350 ms
TZON-0004SLOWVT: 16
TZON-0004VSLOWVT: 40
TZON-0005SLOWVT: 12
TZST-0001FASTVT: 2
TZST-0001FASTVT: 3
TZST-0001FASTVT: 4
TZST-0001FASTVT: 6
TZST-0001SLOWVT: 4
TZST-0001SLOWVT: 6
TZST-0002FASTVT: NEGATIVE
TZST-0002FASTVT: NEGATIVE
TZST-0002FASTVT: NEGATIVE
TZST-0003SLOWVT: 35 J
TZST-0003SLOWVT: 35 J
VENTRICULAR PACING ICD: 1.21 pct

## 2012-12-01 ENCOUNTER — Telehealth: Payer: Self-pay | Admitting: Internal Medicine

## 2012-12-01 NOTE — Telephone Encounter (Signed)
Pt wants to know if transmission done 11-18 was received , pls call

## 2012-12-01 NOTE — Telephone Encounter (Signed)
Spoke w/pt in regards to transmission. Transmission was received. Pt aware.

## 2013-02-23 ENCOUNTER — Other Ambulatory Visit: Payer: Self-pay | Admitting: Internal Medicine

## 2013-02-23 ENCOUNTER — Ambulatory Visit (INDEPENDENT_AMBULATORY_CARE_PROVIDER_SITE_OTHER): Payer: Medicare Other | Admitting: *Deleted

## 2013-02-23 DIAGNOSIS — I2589 Other forms of chronic ischemic heart disease: Secondary | ICD-10-CM

## 2013-02-23 DIAGNOSIS — I255 Ischemic cardiomyopathy: Secondary | ICD-10-CM

## 2013-02-23 DIAGNOSIS — Z95810 Presence of automatic (implantable) cardiac defibrillator: Secondary | ICD-10-CM

## 2013-03-03 LAB — REMOTE ICD DEVICE
BATTERY VOLTAGE: 2.71 V
DEV-0020ICD: NEGATIVE
PACEART VT: 0
TOT-0001: 1
TOT-0006: 20061208000000
TZAT-0004SLOWVT: 8
TZAT-0004SLOWVT: 8
TZAT-0005SLOWVT: 88 pct
TZAT-0005SLOWVT: 91 pct
TZAT-0011SLOWVT: 10 ms
TZAT-0011SLOWVT: 10 ms
TZAT-0012FASTVT: 200 ms
TZAT-0012SLOWVT: 200 ms
TZAT-0012SLOWVT: 200 ms
TZAT-0018FASTVT: NEGATIVE
TZAT-0020FASTVT: 1.5 ms
TZAT-0020SLOWVT: 1.5 ms
TZAT-0020SLOWVT: 1.5 ms
TZON-0003SLOWVT: 350 ms
TZON-0003VSLOWVT: 400 ms
TZON-0004VSLOWVT: 40
TZST-0001FASTVT: 2
TZST-0001FASTVT: 6
TZST-0001SLOWVT: 5
TZST-0002FASTVT: NEGATIVE
TZST-0002FASTVT: NEGATIVE
TZST-0002FASTVT: NEGATIVE
TZST-0003SLOWVT: 25 J
TZST-0003SLOWVT: 35 J
VENTRICULAR PACING ICD: 6.48 pct

## 2013-03-13 ENCOUNTER — Encounter: Payer: Self-pay | Admitting: *Deleted

## 2013-03-16 ENCOUNTER — Encounter: Payer: Self-pay | Admitting: Internal Medicine

## 2013-05-15 ENCOUNTER — Encounter: Payer: Self-pay | Admitting: Internal Medicine

## 2013-05-15 ENCOUNTER — Ambulatory Visit (INDEPENDENT_AMBULATORY_CARE_PROVIDER_SITE_OTHER): Payer: Medicare Other | Admitting: Internal Medicine

## 2013-05-15 VITALS — BP 158/70 | Ht 62.0 in | Wt 157.1 lb

## 2013-05-15 DIAGNOSIS — I509 Heart failure, unspecified: Secondary | ICD-10-CM

## 2013-05-15 DIAGNOSIS — I5022 Chronic systolic (congestive) heart failure: Secondary | ICD-10-CM

## 2013-05-15 DIAGNOSIS — I2510 Atherosclerotic heart disease of native coronary artery without angina pectoris: Secondary | ICD-10-CM

## 2013-05-15 LAB — ICD DEVICE OBSERVATION
BATTERY VOLTAGE: 2.66 V
FVT: 0
PACEART VT: 0
RV LEAD THRESHOLD: 1.5 V
TZAT-0004SLOWVT: 8
TZAT-0004SLOWVT: 8
TZAT-0005SLOWVT: 88 pct
TZAT-0005SLOWVT: 91 pct
TZAT-0011SLOWVT: 10 ms
TZAT-0011SLOWVT: 10 ms
TZAT-0018SLOWVT: NEGATIVE
TZAT-0018SLOWVT: NEGATIVE
TZAT-0019FASTVT: 8 V
TZAT-0020FASTVT: 1.5 ms
TZON-0004SLOWVT: 16
TZON-0004VSLOWVT: 40
TZON-0005SLOWVT: 12
TZST-0001FASTVT: 3
TZST-0001FASTVT: 5
TZST-0001FASTVT: 6
TZST-0001SLOWVT: 3
TZST-0001SLOWVT: 4
TZST-0001SLOWVT: 6
TZST-0002FASTVT: NEGATIVE
TZST-0002FASTVT: NEGATIVE
TZST-0003SLOWVT: 25 J
TZST-0003SLOWVT: 35 J
VENTRICULAR PACING ICD: 7.74 pct
VF: 0

## 2013-05-15 NOTE — Progress Notes (Signed)
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    She reports difficulty with R shoulder pain and frequent toe pain.  She is doing well from a CV standpoint.  She has had no ICD shocks. Today, she  denies symptoms of palpitations, chest pain, shortness of breath, orthopnea, PND, lower extremity edema, dizziness, presyncope, syncope, or neurologic sequela.  She is primarily limited by arthritis.  The patientis tolerating medications without difficulties and is otherwise without complaint today.   Past Medical History  Diagnosis Date  . Coronary artery disease   . Ischemic cardiomyopathy     s/p ICD (MDT) by Dr Amil Amen 2006  . Carotid stenosis   . Diabetes mellitus   . Hypertension   . Mitral regurgitation   . Dyslipidemia   . Cerebrovascular disease     extracranial  . Osteoporosis   . Chronic systolic dysfunction of left ventricle   . Fractured shoulder 2009    Right shoulder Fx. from a fall    Past Surgical History  Procedure Laterality Date  . Cardiac defibrillator placement  2006    by Dr Amil Amen (MDT) she has a [phone] fidelis lead  . Cataract extraction    . Mitral valve repair    . Appendectomy    . Coronary artery bypass graft  2006  . Salpingoophorectomy  1976    bilateral  . Righ hip repla    . Total hip arthroplasty  2001    right  . Abdominal hysterectomy      History   Social History  . Marital Status: Widowed    Spouse Name: N/A    Number of Children: N/A  . Years of Education: N/A   Occupational History  . retired Engineer, civil (consulting)    Social History Main Topics  . Smoking status: Never Smoker   . Smokeless tobacco: Not on file  . Alcohol Use: No  . Drug Use: No  . Sexually Active: Not on file   Other Topics Concern  .  Not on file   Social History Narrative  . No narrative on file    Family History  Problem Relation Age of Onset  . Coronary artery disease      family hx of  . Hypertension      family hx of  . Diabetes      family hx of  . Cancer Mother     [name]   . Heart disease Mother   . Heart disease Sister   . Cancer Sister     Allergies  Allergen Reactions  . Alendronate Sodium   . Clonidine Derivatives   . Darvocet (Propoxyphene-Acetaminophen)   . Horse-Derived Products   . Penicillins   . Sulfa Drugs Cross Reactors   . Vicodin (Hydrocodone-Acetaminophen)     Current Outpatient Prescriptions  Medication Sig Dispense Refill  . Acetaminophen (TYLENOL EXTRA STRENGTH PO) Take 1-2 tablets by mouth every 6 (six) hours as needed.        Marland Kitchen amLODipine (NORVASC) 5 MG tablet Take 5 mg by mouth daily.        Marland Kitchen aspirin 325 MG EC tablet Take 325 mg by mouth daily.        . Calcium Carbonate-Vitamin D (CALTRATE 600+D) 600-400 MG-UNIT per tablet Take 1  tablet by mouth 2 (two) times daily.       . carvedilol (COREG) 12.5 MG tablet Take 12.5 mg by mouth 2 (two) times daily with a meal.        . Cholecalciferol (VITAMIN D) 2000 UNITS tablet Take 2,000 Units by mouth daily.        . cyanocobalamin 2000 MCG tablet Take 2,000 mcg by mouth daily.        Marland Kitchen ezetimibe-simvastatin (VYTORIN) 10-20 MG per tablet Take 1 tablet by mouth at bedtime.        . fish oil-omega-3 fatty acids 1000 MG capsule Take 2 g by mouth 2 (two) times daily.        . furosemide (LASIX) 40 MG tablet Take 20 mg by mouth daily.        Marland Kitchen glipiZIDE (GLUCOTROL) 10 MG 24 hr tablet Take 20 mg by mouth daily.        Marland Kitchen glucose blood test strip daily.      . hydrALAZINE (APRESOLINE) 25 MG tablet Twice daily.      . Lancets 30G MISC       . losartan (COZAAR) 100 MG tablet Take 100 mg by mouth daily.        . Multiple Vitamins-Minerals (CENTRUM SILVER PO) Take 2 tablets by mouth daily.       Marland Kitchen oxybutynin (DITROPAN-XL) 10 MG 24 hr  tablet Take 10 mg by mouth daily.        . ramipril (ALTACE) 10 MG capsule Take 10 mg by mouth daily.         No current facility-administered medications for this visit.   Physical Exam: Filed Vitals:   05/15/13 1048  BP: 158/70  Height: 5\' 2"  (1.575 m)  Weight: 157 lb 1.9 oz (71.269 kg)    GEN- The patient is well appearing but elderly, alert and oriented x 3 today.   Head- normocephalic, atraumatic Eyes-  Sclera clear, conjunctiva pink Ears- hearing intact Oropharynx- clear Neck- supple, no JVP Lymph- no cervical lymphadenopathy Lungs- Clear to ausculation bilaterally, normal work of breathing Chest- ICD pocket is well healed Heart- Regular rate and rhythm, no murmurs, rubs or gallops, PMI not laterally displaced GI- soft, NT, ND, + BS Extremities- no clubbing, cyanosis,  +trace edema  ICD interrogation- reviewed in detail today,  See PACEART report  Assessment and Plan:  1. Chronic systolic dysfunction Normal ICD function, though R waves are low chronically No SIC counters or impedance changes with her fidelis lead See Pace Art report No changes today  We had a long discussion today.  Given her advanced age and comorbidities, I do not plan to proceed with generator change once she is ERI.  At that point, we will turn her ICD off and continue medical management of her CHF without ICD backup.  carelink I will see again in a year

## 2013-05-15 NOTE — Patient Instructions (Addendum)
Your physician wants you to follow-up in: 12 months with Dr Allred You will receive a reminder letter in the mail two months in advance. If you don't receive a letter, please call our office to schedule the follow-up appointment.     Remote monitoring is used to monitor your Pacemaker of ICD from home. This monitoring reduces the number of office visits required to check your device to one time per year. It allows us to keep an eye on the functioning of your device to ensure it is working properly. You are scheduled for a device check from home on 08/17/13. You may send your transmission at any time that day. If you have a wireless device, the transmission will be sent automatically. After your physician reviews your transmission, you will receive a postcard with your next transmission date.   

## 2013-07-15 ENCOUNTER — Other Ambulatory Visit: Payer: Self-pay | Admitting: Physician Assistant

## 2013-07-15 ENCOUNTER — Ambulatory Visit
Admission: RE | Admit: 2013-07-15 | Discharge: 2013-07-15 | Disposition: A | Discharge status: Home / Self Care | Payer: Medicare Other | Source: Ambulatory Visit | Attending: Physician Assistant | Admitting: Physician Assistant

## 2013-07-15 DIAGNOSIS — R05 Cough: Secondary | ICD-10-CM

## 2013-07-17 ENCOUNTER — Emergency Department (HOSPITAL_COMMUNITY): Payer: Medicare Other

## 2013-07-17 ENCOUNTER — Inpatient Hospital Stay (HOSPITAL_COMMUNITY)
Admission: EM | Admit: 2013-07-17 | Discharge: 2013-07-21 | DRG: 292 | Disposition: A | Discharge status: Home Health | Payer: Medicare Other | Attending: Cardiology | Admitting: Cardiology

## 2013-07-17 ENCOUNTER — Encounter (HOSPITAL_COMMUNITY): Payer: Self-pay | Admitting: Interventional Cardiology

## 2013-07-17 DIAGNOSIS — R252 Cramp and spasm: Secondary | ICD-10-CM | POA: Diagnosis not present

## 2013-07-17 DIAGNOSIS — I129 Hypertensive chronic kidney disease with stage 1 through stage 4 chronic kidney disease, or unspecified chronic kidney disease: Secondary | ICD-10-CM | POA: Diagnosis present

## 2013-07-17 DIAGNOSIS — I5023 Acute on chronic systolic (congestive) heart failure: Secondary | ICD-10-CM

## 2013-07-17 DIAGNOSIS — E785 Hyperlipidemia, unspecified: Secondary | ICD-10-CM | POA: Diagnosis present

## 2013-07-17 DIAGNOSIS — Z954 Presence of other heart-valve replacement: Secondary | ICD-10-CM

## 2013-07-17 DIAGNOSIS — I2510 Atherosclerotic heart disease of native coronary artery without angina pectoris: Secondary | ICD-10-CM

## 2013-07-17 DIAGNOSIS — I442 Atrioventricular block, complete: Secondary | ICD-10-CM

## 2013-07-17 DIAGNOSIS — I2589 Other forms of chronic ischemic heart disease: Secondary | ICD-10-CM | POA: Diagnosis present

## 2013-07-17 DIAGNOSIS — Z8049 Family history of malignant neoplasm of other genital organs: Secondary | ICD-10-CM

## 2013-07-17 DIAGNOSIS — I10 Essential (primary) hypertension: Secondary | ICD-10-CM

## 2013-07-17 DIAGNOSIS — I5033 Acute on chronic diastolic (congestive) heart failure: Principal | ICD-10-CM

## 2013-07-17 DIAGNOSIS — N183 Chronic kidney disease, stage 3 unspecified: Secondary | ICD-10-CM | POA: Diagnosis present

## 2013-07-17 DIAGNOSIS — N189 Chronic kidney disease, unspecified: Secondary | ICD-10-CM

## 2013-07-17 DIAGNOSIS — M129 Arthropathy, unspecified: Secondary | ICD-10-CM | POA: Diagnosis present

## 2013-07-17 DIAGNOSIS — M810 Age-related osteoporosis without current pathological fracture: Secondary | ICD-10-CM | POA: Diagnosis present

## 2013-07-17 DIAGNOSIS — I059 Rheumatic mitral valve disease, unspecified: Secondary | ICD-10-CM | POA: Diagnosis present

## 2013-07-17 DIAGNOSIS — Z833 Family history of diabetes mellitus: Secondary | ICD-10-CM

## 2013-07-17 DIAGNOSIS — Z96649 Presence of unspecified artificial hip joint: Secondary | ICD-10-CM

## 2013-07-17 DIAGNOSIS — E119 Type 2 diabetes mellitus without complications: Secondary | ICD-10-CM | POA: Diagnosis present

## 2013-07-17 DIAGNOSIS — I509 Heart failure, unspecified: Secondary | ICD-10-CM | POA: Diagnosis present

## 2013-07-17 DIAGNOSIS — Z95810 Presence of automatic (implantable) cardiac defibrillator: Secondary | ICD-10-CM

## 2013-07-17 DIAGNOSIS — Z951 Presence of aortocoronary bypass graft: Secondary | ICD-10-CM

## 2013-07-17 DIAGNOSIS — Z8249 Family history of ischemic heart disease and other diseases of the circulatory system: Secondary | ICD-10-CM

## 2013-07-17 DIAGNOSIS — I6529 Occlusion and stenosis of unspecified carotid artery: Secondary | ICD-10-CM | POA: Diagnosis present

## 2013-07-17 DIAGNOSIS — T82198A Other mechanical complication of other cardiac electronic device, initial encounter: Secondary | ICD-10-CM

## 2013-07-17 DIAGNOSIS — I498 Other specified cardiac arrhythmias: Secondary | ICD-10-CM | POA: Diagnosis present

## 2013-07-17 HISTORY — DX: Chronic kidney disease, unspecified: N18.9

## 2013-07-17 LAB — COMPREHENSIVE METABOLIC PANEL
AST: 37 U/L (ref 0–37)
Albumin: 3.5 g/dL (ref 3.5–5.2)
Alkaline Phosphatase: 50 U/L (ref 39–117)
BUN: 28 mg/dL — ABNORMAL HIGH (ref 6–23)
CO2: 21 mEq/L (ref 19–32)
Chloride: 109 mEq/L (ref 96–112)
Creatinine, Ser: 1.54 mg/dL — ABNORMAL HIGH (ref 0.50–1.10)
GFR calc non Af Amer: 30 mL/min — ABNORMAL LOW (ref >90)
Potassium: 4.4 mEq/L (ref 3.5–5.1)
Total Bilirubin: 0.5 mg/dL (ref 0.3–1.2)

## 2013-07-17 LAB — URINALYSIS, ROUTINE W REFLEX MICROSCOPIC
Bilirubin Urine: NEGATIVE
Glucose, UA: NEGATIVE mg/dL
Ketones, ur: NEGATIVE mg/dL
Protein, ur: NEGATIVE mg/dL
pH: 5 (ref 5.0–8.0)

## 2013-07-17 LAB — CBC WITH DIFFERENTIAL/PLATELET
Basophils Absolute: 0 10*3/uL (ref 0.0–0.1)
Basophils Relative: 0 % (ref 0–1)
HCT: 32 % — ABNORMAL LOW (ref 36.0–46.0)
Hemoglobin: 10.6 g/dL — ABNORMAL LOW (ref 12.0–15.0)
Lymphocytes Relative: 16 % (ref 12–46)
Monocytes Absolute: 0.6 10*3/uL (ref 0.1–1.0)
Monocytes Relative: 8 % (ref 3–12)
Neutro Abs: 4.9 10*3/uL (ref 1.7–7.7)
Neutrophils Relative %: 73 % (ref 43–77)
RBC: 3.35 MIL/uL — ABNORMAL LOW (ref 3.87–5.11)
WBC: 6.7 10*3/uL (ref 4.0–10.5)

## 2013-07-17 LAB — GLUCOSE, CAPILLARY: Glucose-Capillary: 142 mg/dL — ABNORMAL HIGH (ref 70–99)

## 2013-07-17 LAB — PRO B NATRIURETIC PEPTIDE: Pro B Natriuretic peptide (BNP): 3719 pg/mL — ABNORMAL HIGH (ref 0–450)

## 2013-07-17 LAB — CBC
HCT: 31.2 % — ABNORMAL LOW (ref 36.0–46.0)
Hemoglobin: 10.5 g/dL — ABNORMAL LOW (ref 12.0–15.0)
MCV: 95.1 fL (ref 78.0–100.0)
Platelets: 133 10*3/uL — ABNORMAL LOW (ref 150–400)
RBC: 3.28 MIL/uL — ABNORMAL LOW (ref 3.87–5.11)
WBC: 7.9 10*3/uL (ref 4.0–10.5)

## 2013-07-17 LAB — CREATININE, SERUM: GFR calc Af Amer: 35 mL/min — ABNORMAL LOW (ref >90)

## 2013-07-17 MED ORDER — POTASSIUM CHLORIDE CRYS ER 20 MEQ PO TBCR
40.0000 meq | EXTENDED_RELEASE_TABLET | Freq: Every day | ORAL | Status: DC
  Administered 2013-07-17 – 2013-07-21 (×5): 40 meq via ORAL
  Filled 2013-07-17 (×5): qty 2

## 2013-07-17 MED ORDER — SIMVASTATIN 20 MG PO TABS
20.0000 mg | ORAL_TABLET | Freq: Every day | ORAL | Status: DC
  Administered 2013-07-17 – 2013-07-20 (×4): 20 mg via ORAL
  Filled 2013-07-17 (×5): qty 1

## 2013-07-17 MED ORDER — FUROSEMIDE 10 MG/ML IJ SOLN
40.0000 mg | Freq: Two times a day (BID) | INTRAMUSCULAR | Status: AC
  Administered 2013-07-17 – 2013-07-18 (×3): 40 mg via INTRAVENOUS
  Filled 2013-07-17 (×3): qty 4

## 2013-07-17 MED ORDER — ACETAMINOPHEN 325 MG PO TABS: 650.0000 mg | ORAL_TABLET | ORAL | Status: DC | PRN

## 2013-07-17 MED ORDER — HYDRALAZINE HCL 25 MG PO TABS
25.0000 mg | ORAL_TABLET | Freq: Two times a day (BID) | ORAL | Status: DC
  Administered 2013-07-17 – 2013-07-21 (×8): 25 mg via ORAL
  Filled 2013-07-17 (×9): qty 1

## 2013-07-17 MED ORDER — HEPARIN SODIUM (PORCINE) 5000 UNIT/ML IJ SOLN
5000.0000 [IU] | Freq: Three times a day (TID) | INTRAMUSCULAR | Status: DC
  Administered 2013-07-17 – 2013-07-21 (×12): 5000 [IU] via SUBCUTANEOUS
  Filled 2013-07-17 (×15): qty 1

## 2013-07-17 MED ORDER — EZETIMIBE-SIMVASTATIN 10-20 MG PO TABS: 1.0000 | ORAL_TABLET | Freq: Every day | ORAL | Status: DC

## 2013-07-17 MED ORDER — AMLODIPINE BESYLATE 10 MG PO TABS
10.0000 mg | ORAL_TABLET | Freq: Every day | ORAL | Status: DC
Start: 2013-07-18 — End: 2013-07-21
  Administered 2013-07-18 – 2013-07-21 (×4): 10 mg via ORAL
  Filled 2013-07-17 (×4): qty 1

## 2013-07-17 MED ORDER — FUROSEMIDE 10 MG/ML IJ SOLN
40.0000 mg | Freq: Once | INTRAMUSCULAR | Status: AC
  Administered 2013-07-17: 40 mg via INTRAVENOUS
  Filled 2013-07-17: qty 4

## 2013-07-17 MED ORDER — SODIUM CHLORIDE 0.9 % IJ SOLN
3.0000 mL | Freq: Two times a day (BID) | INTRAMUSCULAR | Status: DC
  Administered 2013-07-17 – 2013-07-21 (×9): 3 mL via INTRAVENOUS

## 2013-07-17 MED ORDER — SODIUM CHLORIDE 0.9 % IV BOLUS (SEPSIS): 500.0000 mL | Freq: Once | INTRAVENOUS | Status: DC

## 2013-07-17 MED ORDER — SODIUM CHLORIDE 0.9 % IV SOLN: 250.0000 mL | INTRAVENOUS | Status: DC | PRN

## 2013-07-17 MED ORDER — RAMIPRIL 10 MG PO CAPS
10.0000 mg | ORAL_CAPSULE | Freq: Two times a day (BID) | ORAL | Status: DC
  Administered 2013-07-17 – 2013-07-18 (×2): 10 mg via ORAL
  Administered 2013-07-18: 11:00:00 via ORAL
  Administered 2013-07-19 – 2013-07-21 (×5): 10 mg via ORAL
  Filled 2013-07-17 (×9): qty 1

## 2013-07-17 MED ORDER — SODIUM CHLORIDE 0.9 % IJ SOLN: 3.0000 mL | INTRAMUSCULAR | Status: DC | PRN

## 2013-07-17 MED ORDER — GLIPIZIDE ER 10 MG PO TB24
20.0000 mg | ORAL_TABLET | Freq: Every day | ORAL | Status: DC
  Administered 2013-07-18 – 2013-07-21 (×4): 20 mg via ORAL
  Filled 2013-07-17 (×4): qty 2

## 2013-07-17 MED ORDER — ASPIRIN EC 325 MG PO TBEC
325.0000 mg | DELAYED_RELEASE_TABLET | Freq: Every day | ORAL | Status: DC
  Administered 2013-07-18 – 2013-07-21 (×4): 325 mg via ORAL
  Filled 2013-07-17 (×4): qty 1

## 2013-07-17 MED ORDER — OXYBUTYNIN CHLORIDE ER 10 MG PO TB24
10.0000 mg | ORAL_TABLET | Freq: Every day | ORAL | Status: DC
  Administered 2013-07-18 – 2013-07-21 (×4): 10 mg via ORAL
  Filled 2013-07-17 (×4): qty 1

## 2013-07-17 MED ORDER — EZETIMIBE 10 MG PO TABS
10.0000 mg | ORAL_TABLET | Freq: Every day | ORAL | Status: DC
  Administered 2013-07-17 – 2013-07-20 (×4): 10 mg via ORAL
  Filled 2013-07-17 (×5): qty 1

## 2013-07-17 MED ORDER — ONDANSETRON HCL 4 MG/2ML IJ SOLN: 4.0000 mg | Freq: Four times a day (QID) | INTRAMUSCULAR | Status: DC | PRN

## 2013-07-17 NOTE — ED Notes (Signed)
Cardiology MD at bedside.

## 2013-07-17 NOTE — Care Management Note (Signed)
    Page 1 of 1   07/17/2013     2:44:31 PM   CARE MANAGEMENT NOTE 07/17/2013  Patient:  [name], [name]   Account Number:  [id number]  Date Initiated:  07/17/2013  Documentation initiated by:  Oletta Cohn  Subjective/Objective Assessment:   79 yo female Chief complaint/reason for admission: Shortness of breath, fatigue//     Action/Plan:   Treat with IV furosemide to help with diuresis.//home with HH   Anticipated DC Date:  07/20/2013   Anticipated DC Plan:  HOME W HOME HEALTH SERVICES         Choice offered to / List presented to:             Status of service:   Medicare Important Message given?   (If response is "NO", the following Medicare IM given date fields will be blank) Date Medicare IM given:   Date Additional Medicare IM given:    Discharge Disposition:    Per UR Regulation:    If discussed at Long Length of Stay Meetings, dates discussed:    Comments:  07/17/13 @ 1415.Marland KitchenMarland KitchenOletta Cohn, RN, BSN, [place] [phone] Pt in bedside procedure upon my arrival.  Will follow-up with pt to set up [facility].

## 2013-07-17 NOTE — H&P (Addendum)
Admit date: 07/17/2013 Referring Physician Dr. Manus Gunning Primary Cardiologist Dr. Armanda Magic Chief complaint/reason for admission: Shortness of breath, fatigue  HPI: 79 year old woman with known ischemic cardiomyopathy.  Ejection fraction has been in the 20-25% range.  Last echocardiogram done in the office was in 2007.  She has had some issues with fatigue over the past few weeks.  She start primary care physician on Monday and had a physical.  No significant abnormalities were found.  Her shortness of breath got worse over the week.  Her Lasix dose was increased on Wednesday.  Last night, she had trouble sleeping due to shortness of breath.  She has had chronic swelling of the right lower trembly.  At home, her heart rate is often in the 40s.  She has been less active due to the shortness of breath.  She is also limited by arthritis.  She denies any chest pain, tightness or pressure.  She has not had lightheadedness or syncope.  She does not report any firing of her ICD.  Of note, she saw Dr. Johney Frame in May of this year.  They decided that due to her age and comorbidities, they would not pursue generator change out when her device reaches ERI.      PMH:    Past Medical History  Diagnosis Date  . Coronary artery disease   . Ischemic cardiomyopathy     s/p ICD (MDT) by Dr Amil Amen 2006  . Carotid stenosis   . Diabetes mellitus   . Hypertension   . Mitral regurgitation   . Dyslipidemia   . Cerebrovascular disease     extracranial  . Osteoporosis   . Chronic systolic dysfunction of left ventricle   . Fractured shoulder 2009    Right shoulder Fx. from a fall    PSH:    Past Surgical History  Procedure Laterality Date  . Cardiac defibrillator placement  2006    by Dr Amil Amen (MDT) she has a [phone] fidelis lead  . Cataract extraction    . Mitral valve repair    . Appendectomy    . Coronary artery bypass graft  2006  . Salpingoophorectomy  1976    bilateral  . Righ hip repla    . Total  hip arthroplasty  2001    right  . Abdominal hysterectomy      ALLERGIES:   Fosamax; Alendronate sodium; Clonidine derivatives; Darvocet; Horse-derived products; Sulfa drugs cross reactors; Tramadol; Vicodin; Penicillins; and Septra  Prior to Admit Meds:   (Not in a hospital admission) Family HX:    Family History  Problem Relation Age of Onset  . Coronary artery disease      family hx of  . Hypertension      family hx of  . Diabetes      family hx of  . Cancer Mother     Raj Janus   . Heart disease Mother   . Heart disease Sister   . Cancer Sister    Social HX:    History   Social History  . Marital Status: Widowed    Spouse Name: N/A    Number of Children: N/A  . Years of Education: N/A   Occupational History  . retired Engineer, civil (consulting)    Social History Main Topics  . Smoking status: Never Smoker   . Smokeless tobacco: Not on file  . Alcohol Use: No  . Drug Use: No  . Sexually Active: Not on file   Other Topics Concern  . Not on  file   Social History Narrative  . No narrative on file     ROS:  All 11 ROS were addressed and are negative except what is stated in the HPI  PHYSICAL EXAM Filed Vitals:   07/17/13 0930  BP: 149/37  Pulse: 40  Resp: 19   General: Well developed, well nourished, mild shortness of breath Head:    Normal cephalic and atramatic  Lungs:  Bibasilar crackles Heart:  Bradycardic, S1 S2  No JVD.  Abdomen: Obese, abdomen soft and non-tender Msk:  Back normal, normal gait. Normal strength and tone for age. Extremities:  1+ right lower extremity edema.  No significant left lower extremity edema Neuro: Alert and oriented X 3. Psych: Normal affect, responds appropriately   Labs:   Lab Results  Component Value Date   WBC 6.7 07/17/2013   HGB 10.6* 07/17/2013   HCT 32.0* 07/17/2013   MCV 95.5 07/17/2013   PLT 133* 07/17/2013    Recent Labs Lab 07/17/13 0847  NA 141  K 4.4  CL 109  CO2 21  BUN 28*  CREATININE 1.54*  CALCIUM 9.3  PROT  6.2  BILITOT 0.5  ALKPHOS 50  ALT 39*  AST 37  GLUCOSE 186*   Lab Results  Component Value Date   TROPONINI <0.30 07/17/2013   No results found for this basename: PTT   Lab Results  Component Value Date   INR 1.04 07/17/2013     No results found for this basename: CHOL   No results found for this basename: HDL   No results found for this basename: LDLCALC   No results found for this basename: TRIG   No results found for this basename: CHOLHDL   No results found for this basename: LDLDIRECT      Radiology:  Pulmonary vascular congestion with bilateral pleural effusions  EKG:  Ventricular paced rhythm, rate of 40.  Cannot exclude underlying atrial fibrillation  ASSESSMENT: Acute on chronic systolic heart failure  PLAN:  Treat with IV furosemide to help with diuresis.  She is fluid overloaded and this is likely the cause of her shortness of breath.  Of note, her right lower extremity edema is chronic since she had right hip surgery.  Will not workup for DVT.  Am also concerned that bradycardia may be contributing to her fatigue.  Will hold beta blocker at this time given her acute heart failure exacerbation.  Carvedilol was decreased from 25 mg twice a day to 12.5 mg twice a day several months ago.  Ultimately, when euvolemic, she may require even a lower dosage of beta blocker.  Would prefer that she not rely on the pacemaker and that she maintain her intrinsic heartbeat.  Mild renal insufficiency.  Will follow daily Bmet.  May need higher home dose of oral Lasix.  Subcutaneous heparin for DVT prophylaxis.  CAD- no angina. Troponin negative.  Repeat echocardiogram since it has been several years since last check of ejection fraction.  Corky Crafts., MD  07/17/2013  11:16 AM

## 2013-07-17 NOTE — ED Notes (Signed)
Patient returned from CT

## 2013-07-17 NOTE — ED Notes (Signed)
Per report from [facility] pt was at home and has been having a increase in weakness.  She has a internal pacer [facility] cardiology).  Per report pt is currently being monitored for a faulty defibrillation wire.

## 2013-07-17 NOTE — Progress Notes (Signed)
  Echocardiogram 2D Echocardiogram has been performed.  Arvil Chaco 07/17/2013, 1:40 PM

## 2013-07-17 NOTE — Progress Notes (Addendum)
Report given to receiving RN. No concerns at this time. Patient is stable, with no signs or symptoms of distress or discomfort.

## 2013-07-17 NOTE — ED Notes (Signed)
Patient transported to CT 

## 2013-07-17 NOTE — ED Provider Notes (Signed)
History    CSN: [id number] Arrival date & time 07/17/13  0805  First MD Initiated Contact with Patient 07/17/13 [id number]     Chief Complaint  Patient presents with  . Bradycardia   (Consider location/radiation/quality/duration/timing/severity/associated sxs/prior Treatment) HPI Comments: Patient presents via EMS from home with generalized weakness and "head feeling in a vice" with dizziness and nausea. He started yesterday. She is noted to be bradycardic in the 30s. She has a pacemaker and defibrillator was has a questionable faulty wire. She denies any shocks. She denies any chest pain  but does endorse shortness of breath.she denies any cough or fever. She saw her primary Dr. 3 days ago. She is a history of ischemic arm adenopathy with AICD,  mitral regurgitation and chronic systolic heart failure.   The history is provided by the patient and the EMS personnel. The history is limited by the condition of the patient.   Past Medical History  Diagnosis Date  . Coronary artery disease   . Ischemic cardiomyopathy     s/p ICD (MDT) by Dr Amil Amen 2006  . Carotid stenosis   . Diabetes mellitus   . Hypertension   . Mitral regurgitation   . Dyslipidemia   . Cerebrovascular disease     extracranial  . Osteoporosis   . Chronic systolic dysfunction of left ventricle   . Fractured shoulder 2009    Right shoulder Fx. from a fall  . Acute on chronic systolic heart failure   . Chronic kidney disease, unspecified    Past Surgical History  Procedure Laterality Date  . Cardiac defibrillator placement  2006    by Dr Amil Amen (MDT) she has a [phone] fidelis lead  . Cataract extraction    . Mitral valve repair    . Appendectomy    . Coronary artery bypass graft  2006  . Salpingoophorectomy  1976    bilateral  . Righ hip repla    . Total hip arthroplasty  2001    right  . Abdominal hysterectomy     Family History  Problem Relation Age of Onset  . Coronary artery disease      family hx of   . Hypertension      family hx of  . Diabetes      family hx of  . Cancer Mother     Raj Janus   . Heart disease Mother   . Heart disease Sister   . Cancer Sister    History  Substance Use Topics  . Smoking status: Never Smoker   . Smokeless tobacco: Not on file  . Alcohol Use: No   OB History   Grav Para Term Preterm Abortions TAB SAB Ect Mult Living                 Review of Systems  Constitutional: Positive for activity change, appetite change and fatigue. Negative for fever.  HENT: Negative for congestion and rhinorrhea.   Respiratory: Positive for shortness of breath. Negative for cough and chest tightness.   Cardiovascular: Positive for palpitations. Negative for chest pain and leg swelling.  Gastrointestinal: Positive for nausea. Negative for vomiting and abdominal pain.  Genitourinary: Negative for dysuria and hematuria.  Musculoskeletal: Negative for back pain.  Neurological: Positive for dizziness, weakness and light-headedness. Negative for headaches.  A complete 10 system review of systems was obtained and all systems are negative except as noted in the HPI and PMH.    Allergies  Fosamax; Alendronate sodium; Clonidine derivatives; Darvocet; Horse-derived  products; Sulfa drugs cross reactors; Tramadol; Vicodin; Penicillins; and Septra  Home Medications   No current outpatient prescriptions on file. BP 147/65  Pulse 40  Temp(Src) 97.9 F (36.6 C) (Oral)  Resp 20  Ht 5\' 2"  (1.575 m)  Wt 157 lb 13.6 oz (71.6 kg)  BMI 28.86 kg/m2  SpO2 96% Physical Exam  Constitutional: She is oriented to person, place, and time. She appears well-developed and well-nourished.  HENT:  Head: Normocephalic and atraumatic.  Mouth/Throat: Oropharynx is clear and moist. No oropharyngeal exudate.  Eyes: Conjunctivae and EOM are normal. Pupils are equal, round, and reactive to light.  Neck: Normal range of motion. Neck supple.  Cardiovascular: Normal rate, regular rhythm and  normal heart sounds.   Bradycardic  Pulmonary/Chest: Effort normal and breath sounds normal. No respiratory distress.  By basilar crackles  Abdominal: Soft. There is no tenderness. There is no rebound and no guarding.  Musculoskeletal: Normal range of motion. She exhibits edema. She exhibits no tenderness.  Trace pedal edema  Neurological: She is alert and oriented to person, place, and time. No cranial nerve deficit. She exhibits normal muscle tone. Coordination normal.  Skin: Skin is warm.    ED Course  Procedures (including critical care time) Labs Reviewed  CBC WITH DIFFERENTIAL - Abnormal; Notable for the following:    RBC 3.35 (*)    Hemoglobin 10.6 (*)    HCT 32.0 (*)    Platelets 133 (*)    All other components within normal limits  COMPREHENSIVE METABOLIC PANEL - Abnormal; Notable for the following:    Glucose, Bld 186 (*)    BUN 28 (*)    Creatinine, Ser 1.54 (*)    ALT 39 (*)    GFR calc non Af Amer 30 (*)    GFR calc Af Amer 35 (*)    All other components within normal limits  URINALYSIS, ROUTINE W REFLEX MICROSCOPIC - Abnormal; Notable for the following:    APPearance HAZY (*)    All other components within normal limits  PRO B NATRIURETIC PEPTIDE - Abnormal; Notable for the following:    Pro B Natriuretic peptide (BNP) 3719.0 (*)    All other components within normal limits  CBC - Abnormal; Notable for the following:    RBC 3.28 (*)    Hemoglobin 10.5 (*)    HCT 31.2 (*)    Platelets 133 (*)    All other components within normal limits  CREATININE, SERUM - Abnormal; Notable for the following:    Creatinine, Ser 1.54 (*)    GFR calc non Af Amer 30 (*)    GFR calc Af Amer 35 (*)    All other components within normal limits  GLUCOSE, CAPILLARY - Abnormal; Notable for the following:    Glucose-Capillary 142 (*)    All other components within normal limits  TROPONIN I  PROTIME-INR  BASIC METABOLIC PANEL  TROPONIN I   Ct Head Wo Contrast  07/17/2013    *RADIOLOGY REPORT*  Clinical Data: Short of breath and dizziness  CT HEAD WITHOUT CONTRAST  Technique:  Contiguous axial images were obtained from the base of the skull through the vertex without contrast.  Comparison: CT 09/23/2009  Findings: Generalized atrophy.  Asymmetric enlargement of the right sylvian fissure is unchanged and may be related prior insular infarction.  There is chronic microvascular ischemic change in the white matter.  Small chronic infarct left cerebellum.  Negative for acute infarct.  Negative for hemorrhage or mass.  Calcified  carotid vertebral arteries.  No focal bony lesion.  IMPRESSION: Atrophy and chronic ischemia.  No change from prior studies.  No acute abnormality.   Original Report Authenticated By: Janeece Riggers, M.D.   Dg Chest Portable 1 View  07/17/2013   *RADIOLOGY REPORT*  Clinical Data: Shortness of breath, hypertension  PORTABLE CHEST - 1 VIEW  Comparison: 07/15/2013; 01/22/2010  Findings: Grossly unchanged cardiac silhouette and mediastinal contours post median sternotomy, CABG and aortic valve replacement. Stable positioning of support apparatus.  Pulmonary vasculature is less distinct on the present examination with cephalization of flow.  Worsening perihilar and bibasilar heterogeneous opacities. No definite pleural effusion or pneumothorax.  Grossly unchanged bones.  IMPRESSION: Suspected mild worsening of pulmonary edema and perihilar/bibasilar atelectasis.   Original Report Authenticated By: Tacey Ruiz, MD   1. Acute on chronic systolic heart failure   2. Chronic kidney disease, unspecified   3. CAD (coronary artery disease)     MDM  From home with increased weakness, nausea. No chest pain. But does have SOB. Bradycardic and paced in 40s which she says is normal for her.  APpear to have mild volume overload. No EKG Changes. Given iv lasix. PAcer interrogation is unrevealing. PAtient is also likely symptomatic from her bradycardia.  D/w Dr. Katrinka Blazing who  will have Dr. Eldridge Dace evaluate.   Date: 07/17/2013  Rate: 42  Rhythm: paced  QRS Axis: normal  Intervals: normal  ST/T Wave abnormalities: nonspecific ST/T changes  Conduction Disutrbances:none  Narrative Interpretation:   Old EKG Reviewed: changes noted      Glynn Octave, MD 07/17/13 1750

## 2013-07-18 ENCOUNTER — Encounter (HOSPITAL_COMMUNITY): Payer: Self-pay | Admitting: General Practice

## 2013-07-18 LAB — BASIC METABOLIC PANEL
Calcium: 9.3 mg/dL (ref 8.4–10.5)
Chloride: 107 mEq/L (ref 96–112)
Creatinine, Ser: 1.51 mg/dL — ABNORMAL HIGH (ref 0.50–1.10)
GFR calc Af Amer: 36 mL/min — ABNORMAL LOW (ref >90)
Sodium: 142 mEq/L (ref 135–145)

## 2013-07-18 LAB — TROPONIN I: Troponin I: <0.3 ng/mL (ref <0.30)

## 2013-07-18 NOTE — Progress Notes (Signed)
Client remains alert and comfortable.  No change in nursing assessment.

## 2013-07-18 NOTE — Progress Notes (Signed)
Subjective:  Breathing is better today. 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 Not currently short of breath. She notes that she has been living independently at home but has been severely dyspneic.  Objective:  Vital Signs in the last 24 hours: BP 155/44  Pulse 40  Temp(Src) 98.6 F (37 C) (Oral)  Resp 18  Ht 5\' 2"  (1.575 m)  Wt 70.1 kg (154 lb 8.7 oz)  BMI 28.26 kg/m2  SpO2 97%  Physical Exam: Elderly female who is alert and oriented and is in no acute distress Lungs:  Clear to A&P Cardiac:  Very slow rhythm, normal S1-S2 no S3  Abdomen:  Soft, nontender, no masses Extremities: Trace edema  Intake/Output from previous day: 07/18 0701 - 07/19 0700 In: 603 [P.O.:600; I.V.:3] Out: 2975 [Urine:2975]  Weight Filed Weights   07/17/13 1221 07/18/13 0634  Weight: 71.6 kg (157 lb 13.6 oz) 70.1 kg (154 lb 8.7 oz)    Lab Results: Basic Metabolic Panel:  Recent Labs  60/45/40 0847 07/17/13 1545 07/18/13 0600  NA 141  --  142  K 4.4  --  3.8  CL 109  --  107  CO2 21  --  22  GLUCOSE 186*  --  116*  BUN 28*  --  27*  CREATININE 1.54* 1.54* 1.51*   CBC:  Recent Labs  07/17/13 0847 07/17/13 1545  WBC 6.7 7.9  NEUTROABS 4.9  --   HGB 10.6* 10.5*  HCT 32.0* 31.2*  MCV 95.5 95.1  PLT 133* 133*   Cardiac Enzymes:  Recent Labs  07/17/13 0848 07/18/13 0600  TROPONINI <0.30 <0.30    Telemetry: Paced rhythm at a rate of 40  Assessment/Plan:  1. Acute on chronic systolic heart failure improved 2. Severe bradycardia with ventricular pacing that may be contributing to heart failure 3. Functioning AICD 4. Coronary artery disease 5. Stage III chronic kidney disease  Recommendations:  Will have representative increase pacemaking rate for the time being. She made to have conversion to a biventricular pacemaker if her bradycardia fails to resolve as she was living independently at home previously.     Alyssa Palmer  MD [facility]  07/18/2013,  10:02 AM

## 2013-07-19 DIAGNOSIS — N189 Chronic kidney disease, unspecified: Secondary | ICD-10-CM

## 2013-07-19 DIAGNOSIS — I10 Essential (primary) hypertension: Secondary | ICD-10-CM

## 2013-07-19 DIAGNOSIS — I5023 Acute on chronic systolic (congestive) heart failure: Secondary | ICD-10-CM

## 2013-07-19 LAB — BASIC METABOLIC PANEL
Calcium: 9.3 mg/dL (ref 8.4–10.5)
Chloride: 104 mEq/L (ref 96–112)
Creatinine, Ser: 1.58 mg/dL — ABNORMAL HIGH (ref 0.50–1.10)
GFR calc Af Amer: 34 mL/min — ABNORMAL LOW (ref >90)
Sodium: 139 mEq/L (ref 135–145)

## 2013-07-19 LAB — MAGNESIUM: Magnesium: 2.1 mg/dL (ref 1.5–2.5)

## 2013-07-19 MED ORDER — FUROSEMIDE 40 MG PO TABS
40.0000 mg | ORAL_TABLET | Freq: Every day | ORAL | Status: DC
  Administered 2013-07-19 – 2013-07-21 (×3): 40 mg via ORAL
  Filled 2013-07-19 (×3): qty 1

## 2013-07-19 NOTE — Progress Notes (Signed)
Required reorientation earlier this morning.  Alert at this time.

## 2013-07-19 NOTE — Progress Notes (Signed)
Patient ID: [name], female   DOB: [birth year], 79 y.o.   MRN: [id number]    SUBJECTIVE: [name] says, "This is the best I've felt in a very long time". She denies chest pain and shortness of breath. She's been bothered by leg cramps the past 2 nights.   Filed Vitals:   07/18/13 0634 07/18/13 1312 07/18/13 2106 07/19/13 0507  BP: 155/44 133/34 151/53 143/55  Pulse: 40 40 63 62  Temp: 98.6 F (37 C) 98.1 F (36.7 C) 98 F (36.7 C) 98.1 F (36.7 C)  TempSrc: Oral Oral Oral Oral  Resp: 18 18 18 18   Height:      Weight: 154 lb 8.7 oz (70.1 kg)   151 lb 4.8 oz (68.629 kg)  SpO2: 97% 97% 98% 98%    Intake/Output Summary (Last 24 hours) at 07/19/13 0920 Last data filed at 07/19/13 0846  Gross per 24 hour  Intake   1420 ml  Output   1700 ml  Net   -280 ml    PHYSICAL EXAM General: NAD Neck: No JVD, no thyromegaly or thyroid nodule.  Lungs: Clear to auscultation bilaterally with normal respiratory effort. CV: Nondisplaced PMI.  Heart regular S1/S2, no S3/S4, soft II/VI pansystolic murmur.  Trace pedal edema Abdomen: Soft, nontender, no hepatosplenomegaly, no distention.  Neurologic: Alert and oriented x 3.  Psych: Normal affect. Extremities: No clubbing or cyanosis.   TELEMETRY: Reviewed telemetry pt in a paced rhythm, HR in 60's.  LABS: Basic Metabolic Panel:  Recent Labs  40/98/11 0600 07/19/13 0450  NA 142 139  K 3.8 4.1  CL 107 104  CO2 22 22  GLUCOSE 116* 108*  BUN 27* 32*  CREATININE 1.51* 1.58*  CALCIUM 9.3 9.3   Liver Function Tests:  Recent Labs  07/17/13 0847  AST 37  ALT 39*  ALKPHOS 50  BILITOT 0.5  PROT 6.2  ALBUMIN 3.5   No results found for this basename: LIPASE, AMYLASE,  in the last 72 hours CBC:  Recent Labs  07/17/13 0847 07/17/13 1545  WBC 6.7 7.9  NEUTROABS 4.9  --   HGB 10.6* 10.5*  HCT 32.0* 31.2*  MCV 95.5 95.1  PLT 133* 133*   Cardiac Enzymes:  Recent Labs  07/17/13 0848 07/18/13 0600  TROPONINI <0.30  <0.30   BNP: No components found with this basename: POCBNP,  D-Dimer: No results found for this basename: DDIMER,  in the last 72 hours Hemoglobin A1C: No results found for this basename: HGBA1C,  in the last 72 hours Fasting Lipid Panel: No results found for this basename: CHOL, HDL, LDLCALC, TRIG, CHOLHDL, LDLDIRECT,  in the last 72 hours Thyroid Function Tests: No results found for this basename: TSH, T4TOTAL, FREET3, T3FREE, THYROIDAB,  in the last 72 hours Anemia Panel: No results found for this basename: VITAMINB12, FOLATE, FERRITIN, TIBC, IRON, RETICCTPCT,  in the last 72 hours  RADIOLOGY: Dg Chest 2 View  07/15/2013   *RADIOLOGY REPORT*  Clinical Data: Cough, shortness of breath, history of CHF and prior CABG with pacer and defibrillator  CHEST - 2 VIEW  Comparison: Chest x-ray of 01/22/2010  Findings: There is cardiomegaly present with small pleural effusions and mild pulmonary vascular congestion consistent with mild CHF.  Defibrillator leads remain.  Median sternotomy sutures are noted.  There are degenerative changes in both shoulders.  IMPRESSION: Mild CHF with small effusions.   Original Report Authenticated By: Dwyane Dee, M.D.   Ct Head Wo Contrast  07/17/2013   *  RADIOLOGY REPORT*  Clinical Data: Short of breath and dizziness  CT HEAD WITHOUT CONTRAST  Technique:  Contiguous axial images were obtained from the base of the skull through the vertex without contrast.  Comparison: CT 09/23/2009  Findings: Generalized atrophy.  Asymmetric enlargement of the right sylvian fissure is unchanged and may be related prior insular infarction.  There is chronic microvascular ischemic change in the white matter.  Small chronic infarct left cerebellum.  Negative for acute infarct.  Negative for hemorrhage or mass.  Calcified carotid vertebral arteries.  No focal bony lesion.  IMPRESSION: Atrophy and chronic ischemia.  No change from prior studies.  No acute abnormality.   Original Report  Authenticated By: Janeece Riggers, M.D.   Dg Chest Portable 1 View  07/17/2013   *RADIOLOGY REPORT*  Clinical Data: Shortness of breath, hypertension  PORTABLE CHEST - 1 VIEW  Comparison: 07/15/2013; 01/22/2010  Findings: Grossly unchanged cardiac silhouette and mediastinal contours post median sternotomy, CABG and aortic valve replacement. Stable positioning of support apparatus.  Pulmonary vasculature is less distinct on the present examination with cephalization of flow.  Worsening perihilar and bibasilar heterogeneous opacities. No definite pleural effusion or pneumothorax.  Grossly unchanged bones.  IMPRESSION: Suspected mild worsening of pulmonary edema and perihilar/bibasilar atelectasis.   Original Report Authenticated By: Tacey Ruiz, MD      ASSESSMENT AND PLAN: 1. Acute on chronic systolic HF: she is currently off of diuretics and continues to have a negative fluid balance. Her renal function is tenuous, so I will initiate Furosemide at her home oral dose of 40 mg daily today. Of note, her right lower extremity edema is chronic since she had right hip surgery.   2. Bradycardia: HR now increased from 40's to 60's. I will hold off on starting Carvedilol at this time, but I presume that when it is reinitiated, it will have to be at a lower dose than what she had been taking. Carvedilol was decreased from 25 mg twice a day to 12.5 mg twice a day several months ago.  Will f/u echocardiogram since it has been several years since last check of ejection fraction.   3. CKD stage III: she is  4. Subcutaneous heparin for DVT prophylaxis.  5. CAD- no angina. Troponin negative.  6. Leg cramps: will check a magnesium level. Potassium level is normal.     Prentice Docker, M.D., F.A.C.C.

## 2013-07-20 LAB — BASIC METABOLIC PANEL
Calcium: 9.4 mg/dL (ref 8.4–10.5)
Creatinine, Ser: 1.42 mg/dL — ABNORMAL HIGH (ref 0.50–1.10)
GFR calc Af Amer: 38 mL/min — ABNORMAL LOW (ref >90)

## 2013-07-20 NOTE — Progress Notes (Signed)
SUBJECTIVE:  No compliants  OBJECTIVE:   Vitals:   Filed Vitals:   07/19/13 2010 07/20/13 0552 07/20/13 0931 07/20/13 1449  BP: 129/48 126/47 122/50 114/51  Pulse: 61 61  61  Temp: 97.9 F (36.6 C) 98.1 F (36.7 C)  98.7 F (37.1 C)  TempSrc: Oral Oral  Oral  Resp: 19 20  20   Height:      Weight:  68.9 kg (151 lb 14.4 oz)    SpO2: 98% 97%  96%   I&O's:   Intake/Output Summary (Last 24 hours) at 07/20/13 1615 Last data filed at 07/20/13 1307  Gross per 24 hour  Intake    840 ml  Output    720 ml  Net    120 ml   TELEMETRY: Reviewed telemetry pt in NSR:     PHYSICAL EXAM General: Well developed, well nourished, in no acute distress Head: Eyes PERRLA, No xanthomas.   Normal cephalic and atramatic  Lungs:   Clear bilaterally to auscultation and percussion. Heart:   HRRR S1 S2 Pulses are 2+ & equal. Abdomen: Bowel sounds are positive, abdomen soft and non-tender without masses Extremities:   No clubbing, cyanosis or edema.  DP +1 Neuro: Alert and oriented X 3. Psych:  Good affect, responds appropriately   LABS: Basic Metabolic Panel:  Recent Labs  21/30/86 0600 07/19/13 0450 07/20/13 0820  NA 142 139 139  K 3.8 4.1 4.5  CL 107 104 103  CO2 22 22 20   GLUCOSE 116* 108* 146*  BUN 27* 32* 27*  CREATININE 1.51* 1.58* 1.42*  CALCIUM 9.3 9.3 9.4  MG  --  2.1  --    Liver Function Tests: No results found for this basename: AST, ALT, ALKPHOS, BILITOT, PROT, ALBUMIN,  in the last 72 hours No results found for this basename: LIPASE, AMYLASE,  in the last 72 hours CBC: No results found for this basename: WBC, NEUTROABS, HGB, HCT, MCV, PLT,  in the last 72 hours Cardiac Enzymes:  Recent Labs  07/18/13 0600  TROPONINI <0.30   Coag Panel:   Lab Results  Component Value Date   INR 1.04 07/17/2013    RADIOLOGY: Dg Chest 2 View  07/15/2013   *RADIOLOGY REPORT*  Clinical Data: Cough, shortness of breath, history of CHF and prior CABG with pacer and defibrillator   CHEST - 2 VIEW  Comparison: Chest x-ray of 01/22/2010  Findings: There is cardiomegaly present with small pleural effusions and mild pulmonary vascular congestion consistent with mild CHF.  Defibrillator leads remain.  Median sternotomy sutures are noted.  There are degenerative changes in both shoulders.  IMPRESSION: Mild CHF with small effusions.   Original Report Authenticated By: Dwyane Dee, M.D.   Ct Head Wo Contrast  07/17/2013   *RADIOLOGY REPORT*  Clinical Data: Short of breath and dizziness  CT HEAD WITHOUT CONTRAST  Technique:  Contiguous axial images were obtained from the base of the skull through the vertex without contrast.  Comparison: CT 09/23/2009  Findings: Generalized atrophy.  Asymmetric enlargement of the right sylvian fissure is unchanged and may be related prior insular infarction.  There is chronic microvascular ischemic change in the white matter.  Small chronic infarct left cerebellum.  Negative for acute infarct.  Negative for hemorrhage or mass.  Calcified carotid vertebral arteries.  No focal bony lesion.  IMPRESSION: Atrophy and chronic ischemia.  No change from prior studies.  No acute abnormality.   Original Report Authenticated By: Janeece Riggers, M.D.   Dg Chest  Portable 1 View  07/17/2013   *RADIOLOGY REPORT*  Clinical Data: Shortness of breath, hypertension  PORTABLE CHEST - 1 VIEW  Comparison: 07/15/2013; 01/22/2010  Findings: Grossly unchanged cardiac silhouette and mediastinal contours post median sternotomy, CABG and aortic valve replacement. Stable positioning of support apparatus.  Pulmonary vasculature is less distinct on the present examination with cephalization of flow.  Worsening perihilar and bibasilar heterogeneous opacities. No definite pleural effusion or pneumothorax.  Grossly unchanged bones.  IMPRESSION: Suspected mild worsening of pulmonary edema and perihilar/bibasilar atelectasis.   Original Report Authenticated By: Tacey Ruiz, MD   Assessment/Plan:   1. Acute on chronic diastolic heart failure improved - 2D echo with improved LVF since echo in 2008 - EF now 55% 2. Severe bradycardia with ventricular pacing that may be contributing to heart failure - HR improved off beta blockers 3. Functioning AICD  4. Coronary artery disease  5. Stage III chronic kidney disease - stable Recommendations:  - continue PO Lasix - continue Hydralazine/ACE I - No beta blocker due to bradycardia - currently V paced - probably home in am if ambulating in hall ok  Quintella Reichert, MD  07/20/2013  4:15 PM

## 2013-07-21 DIAGNOSIS — I5033 Acute on chronic diastolic (congestive) heart failure: Principal | ICD-10-CM

## 2013-07-21 DIAGNOSIS — T82198A Other mechanical complication of other cardiac electronic device, initial encounter: Secondary | ICD-10-CM

## 2013-07-21 MED ORDER — POTASSIUM CHLORIDE CRYS ER 20 MEQ PO TBCR: 20.0000 meq | EXTENDED_RELEASE_TABLET | Freq: Every day | ORAL | Status: DC

## 2013-07-21 MED ORDER — ASPIRIN EC 81 MG PO TBEC: 81.0000 mg | DELAYED_RELEASE_TABLET | Freq: Every day | ORAL | Status: DC

## 2013-07-21 NOTE — Progress Notes (Signed)
Pt d/c home with family. D/c instructions and medications reviewed with Pt. Pt states understanding. All pt questions answered.   Reviewed Daily weights,  Fluid limit and  Na with Pt. Pt states no one has told her to weight herself.  HP follow up set up by MD for 8-11 as a post HP follow up appointment.

## 2013-07-21 NOTE — Progress Notes (Signed)
Pt remained on V paced rhytyhm during the shift. Forgetful at times.

## 2013-07-21 NOTE — Consult Note (Signed)
  ELECTROPHYSIOLOGY CONSULT NOTE    Patient ID: [name] MRN: [id number], DOB/AGE: 02/21/1928 79 y.o.  Admit date: 07/17/2013 Date of Consult: 07-21-2013  Primary Physician: SHAW,KIMBERLEE, MD Primary Cardiologist: Traci Turner, MD  Reason for Consultation: bradycardia  HPI:  [name] is a 79 year old female with a past medical history significant for coronary artery disease, ischemic cardiomyopathy (s/p single chamber ICD 2006 by Dr Edmunds), diabetes, hypertension.  Her EF has improved since implant to 55%.(7/14)  She was seen by Dr Allred in the office recently with plans to not change out her ICD when it reached ERI.  Since then, she has been more fatigued and was admitted 7-18.  At that time, she was found to be in heart block with ventricular pacing at 40bpm.  Her Coreg has been held now for 4 days without return of intrinsic conduction.  She feels some better presently.  Her device has been reprogrammed to VVI 60.  Her fatigue and shortness of breath have improved buthave not normalized  EP has been asked to evaluate for treatment options.  Of note, she has a 6949 RV lead which is currently functioning normally.   Past Medical History  Diagnosis Date  . Coronary artery disease   . Ischemic cardiomyopathy     s/p ICD (MDT) by Dr Edmunds 2006  . Carotid stenosis   . Diabetes mellitus   . Hypertension   . Mitral regurgitation   . Dyslipidemia   . Cerebrovascular disease     extracranial  . Osteoporosis   . Chronic systolic dysfunction of left ventricle   . Fractured shoulder 2009    Right shoulder Fx. from a fall  . Acute on chronic systolic heart failure   . Chronic kidney disease, unspecified      Surgical History:  Past Surgical History  Procedure Laterality Date  . Cardiac defibrillator placement  2006    by Dr Edmunds (MDT) she has a 6949 fidelis lead  . Cataract extraction    . Mitral valve repair    . Appendectomy    . Coronary artery bypass graft   2006  . Salpingoophorectomy  1976    bilateral  . Righ hip repla    . Total hip arthroplasty  2001    right  . Abdominal hysterectomy       Prescriptions prior to admission  Medication Sig Dispense Refill  . amLODipine (NORVASC) 5 MG tablet Take 10 mg by mouth daily.       . Calcium Carbonate-Vitamin D (CALTRATE 600+D) 600-400 MG-UNIT per tablet Take 1 tablet by mouth 2 (two) times daily.       . Cholecalciferol (VITAMIN D) 2000 UNITS tablet Take 2,000 Units by mouth daily.       . cyanocobalamin 2000 MCG tablet Take 2,000 mcg by mouth daily.       . ezetimibe-simvastatin (VYTORIN) 10-20 MG per tablet Take 1 tablet by mouth at bedtime.       . fish oil-omega-3 fatty acids 1000 MG capsule Take 1 g by mouth 2 (two) times daily.       . furosemide (LASIX) 40 MG tablet Take 40 mg by mouth daily.       . glipiZIDE (GLUCOTROL) 10 MG 24 hr tablet Take 20 mg by mouth daily.       . hydrALAZINE (APRESOLINE) 25 MG tablet 25 mg 2 (two) times daily.       . Multiple Vitamins-Minerals (CENTRUM SILVER PO) Take 1   tablet by mouth daily.       . oxybutynin (DITROPAN-XL) 10 MG 24 hr tablet Take 10 mg by mouth daily.       . ramipril (ALTACE) 10 MG capsule Take 10 mg by mouth 2 (two) times daily.       . [DISCONTINUED] aspirin 325 MG EC tablet Take 325 mg by mouth daily.        . [DISCONTINUED] carvedilol (COREG) 12.5 MG tablet Take 12.5 mg by mouth 2 (two) times daily with a meal.        . glucose blood test strip daily.      . Lancets 30G MISC         Inpatient Medications:  . amLODipine  10 mg Oral Daily  . aspirin  325 mg Oral Daily  . ezetimibe  10 mg Oral q1800  . furosemide  40 mg Oral Daily  . glipiZIDE  20 mg Oral Daily  . heparin  5,000 Units Subcutaneous Q8H  . hydrALAZINE  25 mg Oral BID  . oxybutynin  10 mg Oral Daily  . potassium chloride  40 mEq Oral Daily  . ramipril  10 mg Oral BID  . simvastatin  20 mg Oral q1800  . sodium chloride  3 mL Intravenous Q12H    Allergies:    Allergies  Allergen Reactions  . Fosamax (Alendronate) Anaphylaxis  . Alendronate Sodium Other (See Comments)    Pt does not recall being allergic  . Clonidine Derivatives Other (See Comments)    Pt does not recall allergy  . Darvocet (Propoxyphene-Acetaminophen) Nausea And Vomiting  . Horse-Derived Products   . Sulfa Drugs Cross Reactors Hives and Itching  . Tramadol Nausea And Vomiting  . Vicodin (Hydrocodone-Acetaminophen) Other (See Comments)    lightheaded  . Penicillins Rash  . Septra (Sulfamethoxazole W-Trimethoprim) Rash    History   Social History  . Marital Status: Widowed    Spouse Name: N/A    Number of Children: N/A  . Years of Education: N/A   Occupational History  . retired nurse    Social History Main Topics  . Smoking status: Never Smoker   . Smokeless tobacco: Not on file  . Alcohol Use: No  . Drug Use: No  . Sexually Active: Not on file   Other Topics Concern  . Not on file   Social History Narrative  . No narrative on file     Family History  Problem Relation Age of Onset  . Coronary artery disease      family hx of  . Hypertension      family hx of  . Diabetes      family hx of  . Cancer Mother     Uterian   . Heart disease Mother   . Heart disease Sister   . Cancer Sister     BP 141/51  Pulse 61  Temp(Src) 98.2 F (36.8 C) (Oral)  Resp 20  Ht 5' 2" (1.575 m)  Wt 152 lb 9.6 oz (69.219 kg)  BMI 27.9 kg/m2  SpO2 100% Well developed and nourished in no acute distress HENT normal Neck supple with JVP-flat Carotids brisk and full without bruits Clear Regular rate and rhythm, no murmurs or gallops Abd-soft with active BS without hepatomegaly No Clubbing cyanosis edema Skin-warm and dry A & Oriented  Grossly normal sensory and motor function   Labs:   Lab Results  Component Value Date   WBC 7.9 07/17/2013   HGB 10.5* 07/17/2013     HCT 31.2* 07/17/2013   MCV 95.1 07/17/2013   PLT 133* 07/17/2013    Recent Labs Lab  07/17/13 0847  07/20/13 0820  NA 141  < > 139  K 4.4  < > 4.5  CL 109  < > 103  CO2 21  < > 20  BUN 28*  < > 27*  CREATININE 1.54*  < > 1.42*  CALCIUM 9.3  < > 9.4  PROT 6.2  --   --   BILITOT 0.5  --   --   ALKPHOS 50  --   --   ALT 39*  --   --   AST 37  --   --   GLUCOSE 186*  < > 146*  < > = values in this interval not displayed.  Radiology/Studies: Dg Chest 2 View 07/15/2013   *RADIOLOGY REPORT*  Clinical Data: Cough, shortness of breath, history of CHF and prior CABG with pacer and defibrillator  CHEST - 2 VIEW  Comparison: Chest x-ray of 01/22/2010  Findings: There is cardiomegaly present with small pleural effusions and mild pulmonary vascular congestion consistent with mild CHF.  Defibrillator leads remain.  Median sternotomy sutures are noted.  There are degenerative changes in both shoulders.  IMPRESSION: Mild CHF with small effusions.   Original Report Authenticated By: Paul Barry, M.D.   EKG:sinus rhythm with complete heart block, v pacing  TELEMETRY: sinus rhythm with complete heart block, ventricular pacing  DEVICE HISTORY: single chamber MDT ICD implanted in 2007 by Dr Edmonds with 6949 lead  Principal Problem:   Atrioventricular block, complete Active Problems:   CAD (coronary artery disease)   6949 lead   Acute on chronic diastolic heart failure   The pt presents with CHB which  Is progressive from her LBBB and now has resulted in V pacing initially at 40>>60.  She is better but still symptomatic   Her device will need revision With restoration of LV function, it is reasonable with no prior VT therapy to transition her device from ICD>>PM.   She has 6949 lead, so the R/S portion of her ICD lead will need to be replaced She is not back to baseline ,but it is reasonable to allow her to go home and arrange for Dr JA to do next week when he returns If her symptoms dictate otherwise, we will gladly accomodate    

## 2013-07-21 NOTE — Progress Notes (Signed)
SUBJECTIVE:  No complaints  OBJECTIVE:   Vitals:   Filed Vitals:   07/20/13 1449 07/20/13 2154 07/20/13 2205 07/21/13 0559  BP: 114/51 122/54 122/54 132/51  Pulse: 61  64 61  Temp: 98.7 F (37.1 C)  98.4 F (36.9 C) 98.2 F (36.8 C)  TempSrc: Oral  Oral Oral  Resp: 20  20 20   Height:      Weight:    69.219 kg (152 lb 9.6 oz)  SpO2: 96%  96% 98%   I&O's:   Intake/Output Summary (Last 24 hours) at 07/21/13 0931 Last data filed at 07/21/13 0857  Gross per 24 hour  Intake    960 ml  Output   1170 ml  Net   -210 ml   TELEMETRY: Reviewed telemetry pt in V paced at 60:     PHYSICAL EXAM General: Well developed, well nourished, in no acute distress Head: Eyes PERRLA, No xanthomas.   Normal cephalic and atramatic Lungs:   Clear bilaterally to auscultation and percussion. Heart:   HRRR S1 S2 Pulses are 2+ & equal. Abdomen: Bowel sounds are positive, abdomen soft and non-tender without masses Extremities:   No clubbing, cyanosis or edema.  DP +1 Neuro: Alert and oriented X 3. Psych:  Good affect, responds appropriately   LABS: Basic Metabolic Panel:  Recent Labs  16/10/96 0450 07/20/13 0820  NA 139 139  K 4.1 4.5  CL 104 103  CO2 22 20  GLUCOSE 108* 146*  BUN 32* 27*  CREATININE 1.58* 1.42*  CALCIUM 9.3 9.4  MG 2.1  --    Coag Panel:   Lab Results  Component Value Date   INR 1.04 07/17/2013    RADIOLOGY: Dg Chest 2 View  07/15/2013   *RADIOLOGY REPORT*  Clinical Data: Cough, shortness of breath, history of CHF and prior CABG with pacer and defibrillator  CHEST - 2 VIEW  Comparison: Chest x-ray of 01/22/2010  Findings: There is cardiomegaly present with small pleural effusions and mild pulmonary vascular congestion consistent with mild CHF.  Defibrillator leads remain.  Median sternotomy sutures are noted.  There are degenerative changes in both shoulders.  IMPRESSION: Mild CHF with small effusions.   Original Report Authenticated By: Dwyane Dee, M.D.   Ct Head  Wo Contrast  07/17/2013   *RADIOLOGY REPORT*  Clinical Data: Short of breath and dizziness  CT HEAD WITHOUT CONTRAST  Technique:  Contiguous axial images were obtained from the base of the skull through the vertex without contrast.  Comparison: CT 09/23/2009  Findings: Generalized atrophy.  Asymmetric enlargement of the right sylvian fissure is unchanged and may be related prior insular infarction.  There is chronic microvascular ischemic change in the white matter.  Small chronic infarct left cerebellum.  Negative for acute infarct.  Negative for hemorrhage or mass.  Calcified carotid vertebral arteries.  No focal bony lesion.  IMPRESSION: Atrophy and chronic ischemia.  No change from prior studies.  No acute abnormality.   Original Report Authenticated By: Janeece Riggers, M.D.   Dg Chest Portable 1 View  07/17/2013   *RADIOLOGY REPORT*  Clinical Data: Shortness of breath, hypertension  PORTABLE CHEST - 1 VIEW  Comparison: 07/15/2013; 01/22/2010  Findings: Grossly unchanged cardiac silhouette and mediastinal contours post median sternotomy, CABG and aortic valve replacement. Stable positioning of support apparatus.  Pulmonary vasculature is less distinct on the present examination with cephalization of flow.  Worsening perihilar and bibasilar heterogeneous opacities. No definite pleural effusion or pneumothorax.  Grossly unchanged bones.  IMPRESSION: Suspected mild worsening of pulmonary edema and perihilar/bibasilar atelectasis.   Original Report Authenticated By: Tacey Ruiz, MD   Assessment/Plan:  1. Acute on chronic diastolic heart failure resolved - 2D echo with improved LVF since echo in 2008 - EF now 50-55%  2. Severe bradycardia with ventricular pacing that may be contributing to heart failure - still V paced at 60bpm - has been off Coreg for 4 days 3. Functioning AICD  4. Coronary artery disease  5. Stage III chronic kidney disease - stable  Recommendations:  - continue PO Lasix  - continue  Hydralazine/ACE I  - No beta blocker due to bradycardia - currently V paced  -consulted EP for possible upgrade of pacing system.  Dr. Graciela Husbands to see today.  May d/c home later today and schedule procedure with Dr. Elizebeth Brooking, MD  07/21/2013  9:31 AM

## 2013-07-21 NOTE — Discharge Summary (Addendum)
Patient ID: [name] MRN: [id number] DOB/AGE: 09-26-1928 79 y.o.  Admit date: 07/17/2013 Discharge date: 07/21/2013  Primary Discharge Diagnosis  Acute on chronic diastolic CHF Secondary Discharge Diagnosis  Symptomatic Bradycardia  CAD  Ischemic DCM s/p ICD now EF 50-55%  Carotid artery stenosis  DM  HTN  Dyslipidemia  Extracranial cerebrovascular disease  Osteoporosis     Significant Diagnostic Studies: cardiac graphics: Echocardiogram: 2D echo:Study Conclusions  - Left ventricle: The cavity size was normal. The estimated ejection fraction was 55%. There was an increased relative contribution of atrial contraction to ventricular filling. Doppler parameters are consistent with abnormal left ventricular relaxation (grade 1 diastolic dysfunction). - Mitral valve: Calcified annulus. Mildly thickened leaflets . Mild regurgitation. - Left atrium: The atrium was moderately dilated. - Atrial septum: No defect or patent foramen ovale was identified. - Tricuspid valve: Moderate regurgitation. - Pulmonary arteries: PA peak pressure: 51mm Hg (S). Impressions:  - The right ventricular systolic pressure was increased consistent with moderate pulmonary hypertension.    Consults: cardiology - EP  Hospital Course: 79 year old woman with known ischemic cardiomyopathy. Ejection fraction has been in the 20-25% range. Last echocardiogram done in the office was in 2007. She has had some issues with fatigue over the past few weeks. She start primary care physician on Monday and had a physical. No significant abnormalities were found. Her shortness of breath got worse over the week. Her Lasix dose was increased on Wednesday.The nigh prior to admission, she had trouble sleeping due to shortness of breath. She has had chronic swelling of the right lower extremity. At home, her heart rate is often in the 40s. She has been less active due to the shortness of breath. She is also limited by  arthritis. She denied any chest pain, tightness or pressure. She had not had lightheadedness or syncope. She did not report any firing of her ICD. Of note, she saw Dr. Johney Frame in May of this year. They decided that due to her age and comorbidities, they would not pursue generator change out when her device reached ERI. She was admitted and diuresed with IV Lasix.  Sh was found to be in complete Heart block with V pacing at 40bpm from her AICD.  Her Coreg was stopped. Despite discontinuation of beta blockers,she remained bradycardic with CHB.  Her AICD rate was increased to 60bpm and she remained V paced.  An EP consult was obtained.  It was felt that she needed to have a dual chamber PPM placed due to persistent bradycardia and heart block. It was felt that this could be done on an outpatient basis since her CHF had resolved.  On the day of admission she was ambulating with out difficulty.  She had no further CHF symptoms.  She will be set up for PPM placement next Wed by Dr. Johney Frame.      Discharge Exam: Blood pressure 108/42, pulse 60, temperature 98.6 F (37 C), temperature source Oral, resp. rate 20, height 5\' 2"  (1.575 m), weight 69.219 kg (152 lb 9.6 oz), SpO2 100.00%.   General appearance: alert Resp: clear to auscultation bilaterally Cardio: regular rate and rhythm, S1, S2 normal, no murmur, click, rub or gallop Extremities: extremities normal, atraumatic, no cyanosis or edema Labs:   Lab Results  Component Value Date   WBC 7.9 07/17/2013   HGB 10.5* 07/17/2013   HCT 31.2* 07/17/2013   MCV 95.1 07/17/2013   PLT 133* 07/17/2013    Recent Labs Lab 07/17/13 0847  07/20/13 0820  NA 141  < > 139  K 4.4  < > 4.5  CL 109  < > 103  CO2 21  < > 20  BUN 28*  < > 27*  CREATININE 1.54*  < > 1.42*  CALCIUM 9.3  < > 9.4  PROT 6.2  --   --   BILITOT 0.5  --   --   ALKPHOS 50  --   --   ALT 39*  --   --   AST 37  --   --   GLUCOSE 186*  < > 146*  < > = values in this interval not  displayed. Lab Results  Component Value Date   TROPONINI <0.30 07/18/2013    No results found for this basename: CHOL   No results found for this basename: HDL   No results found for this basename: LDLCALC   No results found for this basename: TRIG   No results found for this basename: CHOLHDL   No results found for this basename: LDLDIRECT      Radiology:  *RADIOLOGY REPORT*  Clinical Data: Shortness of breath, hypertension  PORTABLE CHEST - 1 VIEW  Comparison: 07/15/2013; 01/22/2010  Findings: Grossly unchanged cardiac silhouette and mediastinal  contours post median sternotomy, CABG and aortic valve replacement.  Stable positioning of support apparatus. Pulmonary vasculature is  less distinct on the present examination with cephalization of  flow. Worsening perihilar and bibasilar heterogeneous opacities.  No definite pleural effusion or pneumothorax. Grossly unchanged  bones.  IMPRESSION:  Suspected mild worsening of pulmonary edema and perihilar/bibasilar  atelectasis.  Original Report Authenticated By: Tacey Ruiz, MD  EKG: Ventricular paced rhyhtm  FOLLOW UP PLANS AND APPOINTMENTS Discharge Orders   Future Appointments Provider Department Dept Phone   08/17/2013 10:45 AM [facility] [employer] Main [facility] [place]) [phone]   10/20/2013 10:30 AM [facility] [phone]   10/20/2013 11:40 AM Evern Bio, NP [facility] [phone]   Future Orders Complete By Expires     Diet - low sodium heart healthy  As directed     Increase activity slowly  As directed         Medication List    STOP taking these medications       carvedilol 12.5 MG tablet  Commonly known as:  COREG      TAKE these medications       amLODipine 5 MG tablet  Commonly known as:  NORVASC  Take 10 mg by mouth daily.     aspirin EC 81 MG tablet  Take 1 tablet (81 mg total) by mouth  daily.     CALTRATE 600+D 600-400 MG-UNIT per tablet  Generic drug:  Calcium Carbonate-Vitamin D  Take 1 tablet by mouth 2 (two) times daily.     CENTRUM SILVER PO  Take 1 tablet by mouth daily.     cyanocobalamin 2000 MCG tablet  Take 2,000 mcg by mouth daily.     ezetimibe-simvastatin 10-20 MG per tablet  Commonly known as:  VYTORIN  Take 1 tablet by mouth at bedtime.     fish oil-omega-3 fatty acids 1000 MG capsule  Take 1 g by mouth 2 (two) times daily.     furosemide 40 MG tablet  Commonly known as:  LASIX  Take 40 mg by mouth daily.     glipiZIDE 10 MG 24 hr tablet  Commonly known as:  GLUCOTROL XL  Take 20  mg by mouth daily.     glucose blood test strip  daily.     hydrALAZINE 25 MG tablet  Commonly known as:  APRESOLINE  25 mg 2 (two) times daily.     Lancets 30G Misc     oxybutynin 10 MG 24 hr tablet  Commonly known as:  DITROPAN-XL  Take 10 mg by mouth daily.     potassium chloride SA 20 MEQ tablet  Commonly known as:  K-DUR,KLOR-CON  Take 1 tablet (20 mEq total) by mouth daily.     ramipril 10 MG capsule  Commonly known as:  ALTACE  Take 10 mg by mouth 2 (two) times daily.     Vitamin D 2000 UNITS tablet  Take 2,000 Units by mouth daily.           Follow-up Information   Follow up with Quintella Reichert, MD On 08/10/2013. (at 2:15pm)    Contact information:   [address] [place] [address] [phone]   The patient was instructed to be at the [facility] on Wednesday July 30th at 12Noon for [facility] placement  She was instructed to remain NPO after midnight 7/29    BRING ALL MEDICATIONS WITH YOU TO FOLLOW UP APPOINTMENTS  Time spent with patient to include physician time: 40 minutes Signed: Quintella Reichert 07/21/2013, 3:22 PM

## 2013-07-22 ENCOUNTER — Other Ambulatory Visit (HOSPITAL_COMMUNITY): Payer: Self-pay | Admitting: Pharmacy Technician

## 2013-07-22 ENCOUNTER — Encounter (HOSPITAL_COMMUNITY): Payer: Self-pay | Admitting: Pharmacy Technician

## 2013-07-22 ENCOUNTER — Other Ambulatory Visit (HOSPITAL_COMMUNITY): Payer: Self-pay | Admitting: Family Medicine

## 2013-07-22 DIAGNOSIS — Z1231 Encounter for screening mammogram for malignant neoplasm of breast: Secondary | ICD-10-CM

## 2013-07-24 ENCOUNTER — Other Ambulatory Visit: Payer: Self-pay | Admitting: *Deleted

## 2013-07-24 DIAGNOSIS — I441 Atrioventricular block, second degree: Secondary | ICD-10-CM

## 2013-07-29 ENCOUNTER — Ambulatory Visit (HOSPITAL_COMMUNITY)
Admission: RE | Admit: 2013-07-29 | Discharge: 2013-07-30 | Disposition: A | Discharge status: Home / Self Care | Payer: Medicare Other | Source: Ambulatory Visit | Attending: Internal Medicine | Admitting: Internal Medicine

## 2013-07-29 ENCOUNTER — Encounter (HOSPITAL_COMMUNITY): Payer: Self-pay | Admitting: General Practice

## 2013-07-29 ENCOUNTER — Encounter (HOSPITAL_COMMUNITY)
Admission: RE | Disposition: A | Discharge status: Home / Self Care | Payer: Self-pay | Source: Ambulatory Visit | Attending: Internal Medicine

## 2013-07-29 DIAGNOSIS — E119 Type 2 diabetes mellitus without complications: Secondary | ICD-10-CM | POA: Diagnosis not present

## 2013-07-29 DIAGNOSIS — I129 Hypertensive chronic kidney disease with stage 1 through stage 4 chronic kidney disease, or unspecified chronic kidney disease: Secondary | ICD-10-CM | POA: Insufficient documentation

## 2013-07-29 DIAGNOSIS — N189 Chronic kidney disease, unspecified: Secondary | ICD-10-CM | POA: Diagnosis not present

## 2013-07-29 DIAGNOSIS — I509 Heart failure, unspecified: Secondary | ICD-10-CM | POA: Diagnosis not present

## 2013-07-29 DIAGNOSIS — Z79899 Other long term (current) drug therapy: Secondary | ICD-10-CM | POA: Diagnosis not present

## 2013-07-29 DIAGNOSIS — I2510 Atherosclerotic heart disease of native coronary artery without angina pectoris: Secondary | ICD-10-CM | POA: Diagnosis not present

## 2013-07-29 DIAGNOSIS — I2589 Other forms of chronic ischemic heart disease: Secondary | ICD-10-CM | POA: Diagnosis not present

## 2013-07-29 DIAGNOSIS — I442 Atrioventricular block, complete: Secondary | ICD-10-CM | POA: Insufficient documentation

## 2013-07-29 DIAGNOSIS — I5043 Acute on chronic combined systolic (congestive) and diastolic (congestive) heart failure: Secondary | ICD-10-CM | POA: Insufficient documentation

## 2013-07-29 DIAGNOSIS — I441 Atrioventricular block, second degree: Secondary | ICD-10-CM

## 2013-07-29 DIAGNOSIS — T82198A Other mechanical complication of other cardiac electronic device, initial encounter: Secondary | ICD-10-CM | POA: Diagnosis present

## 2013-07-29 HISTORY — DX: Cerebral infarction, unspecified: I63.9

## 2013-07-29 HISTORY — DX: Type 2 diabetes mellitus without complications: E11.9

## 2013-07-29 HISTORY — DX: Presence of cardiac pacemaker: Z95.0

## 2013-07-29 HISTORY — DX: Atrioventricular block, complete: I44.2

## 2013-07-29 HISTORY — DX: Unspecified osteoarthritis, unspecified site: M19.90

## 2013-07-29 HISTORY — DX: Overactive bladder: N32.81

## 2013-07-29 HISTORY — PX: PERMANENT PACEMAKER INSERTION: SHX5480

## 2013-07-29 LAB — SURGICAL PCR SCREEN: Staphylococcus aureus: NEGATIVE

## 2013-07-29 LAB — GLUCOSE, CAPILLARY
Glucose-Capillary: 148 mg/dL — ABNORMAL HIGH (ref 70–99)
Glucose-Capillary: 112 mg/dL — ABNORMAL HIGH (ref 70–99)
Glucose-Capillary: 205 mg/dL — ABNORMAL HIGH (ref 70–99)

## 2013-07-29 SURGERY — PERMANENT PACEMAKER INSERTION
Anesthesia: LOCAL

## 2013-07-29 MED ORDER — MUPIROCIN 2 % EX OINT
TOPICAL_OINTMENT | Freq: Once | CUTANEOUS | Status: AC
  Filled 2013-07-29: qty 22

## 2013-07-29 MED ORDER — ASPIRIN EC 81 MG PO TBEC
81.0000 mg | DELAYED_RELEASE_TABLET | Freq: Every morning | ORAL | Status: DC
  Administered 2013-07-30: 09:00:00 81 mg via ORAL
  Filled 2013-07-29: qty 1

## 2013-07-29 MED ORDER — POTASSIUM CHLORIDE CRYS ER 20 MEQ PO TBCR
20.0000 meq | EXTENDED_RELEASE_TABLET | Freq: Every day | ORAL | Status: DC
  Administered 2013-07-29 – 2013-07-30 (×2): 20 meq via ORAL
  Filled 2013-07-29 (×2): qty 1

## 2013-07-29 MED ORDER — SODIUM CHLORIDE 0.9 % IV SOLN: 250.0000 mL | INTRAVENOUS | Status: DC | PRN

## 2013-07-29 MED ORDER — FENTANYL CITRATE 0.05 MG/ML IJ SOLN
INTRAMUSCULAR | Status: AC
  Filled 2013-07-29: qty 2

## 2013-07-29 MED ORDER — LIDOCAINE HCL (PF) 1 % IJ SOLN
INTRAMUSCULAR | Status: AC
  Filled 2013-07-29: qty 60

## 2013-07-29 MED ORDER — SODIUM CHLORIDE 0.9 % IJ SOLN: 3.0000 mL | INTRAMUSCULAR | Status: DC | PRN

## 2013-07-29 MED ORDER — OXYBUTYNIN CHLORIDE ER 10 MG PO TB24
10.0000 mg | ORAL_TABLET | Freq: Every day | ORAL | Status: DC
  Administered 2013-07-29 – 2013-07-30 (×2): 10 mg via ORAL
  Filled 2013-07-29 (×2): qty 1

## 2013-07-29 MED ORDER — ONDANSETRON HCL 4 MG/2ML IJ SOLN: 4.0000 mg | Freq: Four times a day (QID) | INTRAMUSCULAR | Status: DC | PRN

## 2013-07-29 MED ORDER — RAMIPRIL 10 MG PO CAPS
10.0000 mg | ORAL_CAPSULE | Freq: Two times a day (BID) | ORAL | Status: DC
  Administered 2013-07-29 – 2013-07-30 (×2): 10 mg via ORAL
  Filled 2013-07-29 (×3): qty 1

## 2013-07-29 MED ORDER — AMLODIPINE BESYLATE 10 MG PO TABS
10.0000 mg | ORAL_TABLET | Freq: Every day | ORAL | Status: DC
  Administered 2013-07-29 – 2013-07-30 (×2): 10 mg via ORAL
  Filled 2013-07-29 (×2): qty 1

## 2013-07-29 MED ORDER — SODIUM CHLORIDE 0.9 % IR SOLN
80.0000 mg | Status: DC
  Filled 2013-07-29: qty 2

## 2013-07-29 MED ORDER — SODIUM CHLORIDE 0.9 % IJ SOLN: 3.0000 mL | Freq: Two times a day (BID) | INTRAMUSCULAR | Status: DC

## 2013-07-29 MED ORDER — FUROSEMIDE 40 MG PO TABS
40.0000 mg | ORAL_TABLET | Freq: Every day | ORAL | Status: DC
Start: 2013-07-29 — End: 2013-07-30
  Administered 2013-07-29 – 2013-07-30 (×2): 40 mg via ORAL
  Filled 2013-07-29 (×2): qty 1

## 2013-07-29 MED ORDER — SODIUM CHLORIDE 0.9 % IV SOLN
INTRAVENOUS | Status: DC
  Administered 2013-07-29: 1000 mL via INTRAVENOUS

## 2013-07-29 MED ORDER — VANCOMYCIN HCL IN DEXTROSE 1-5 GM/200ML-% IV SOLN
1000.0000 mg | Freq: Two times a day (BID) | INTRAVENOUS | Status: AC
  Administered 2013-07-30: 03:00:00 1000 mg via INTRAVENOUS
  Filled 2013-07-29: qty 200

## 2013-07-29 MED ORDER — ACETAMINOPHEN 325 MG PO TABS
325.0000 mg | ORAL_TABLET | ORAL | Status: DC | PRN
  Administered 2013-07-29 – 2013-07-30 (×2): 650 mg via ORAL
  Filled 2013-07-29: qty 2

## 2013-07-29 MED ORDER — VANCOMYCIN HCL IN DEXTROSE 1-5 GM/200ML-% IV SOLN
1000.0000 mg | INTRAVENOUS | Status: DC
  Filled 2013-07-29: qty 200

## 2013-07-29 MED ORDER — MIDAZOLAM HCL 5 MG/5ML IJ SOLN
INTRAMUSCULAR | Status: AC
  Filled 2013-07-29: qty 5

## 2013-07-29 MED ORDER — GLIPIZIDE ER 10 MG PO TB24
20.0000 mg | ORAL_TABLET | Freq: Every day | ORAL | Status: DC
  Administered 2013-07-30: 20 mg via ORAL
  Filled 2013-07-29 (×2): qty 2

## 2013-07-29 MED ORDER — HYDRALAZINE HCL 25 MG PO TABS
25.0000 mg | ORAL_TABLET | Freq: Two times a day (BID) | ORAL | Status: DC
  Administered 2013-07-29 – 2013-07-30 (×2): 25 mg via ORAL
  Filled 2013-07-29 (×3): qty 1

## 2013-07-29 MED ORDER — EZETIMIBE-SIMVASTATIN 10-20 MG PO TABS
1.0000 | ORAL_TABLET | Freq: Every day | ORAL | Status: DC
  Administered 2013-07-29: 1 via ORAL
  Filled 2013-07-29 (×2): qty 1

## 2013-07-29 MED ORDER — MUPIROCIN 2 % EX OINT
TOPICAL_OINTMENT | CUTANEOUS | Status: AC
  Filled 2013-07-29: qty 22

## 2013-07-29 MED ORDER — CHLORHEXIDINE GLUCONATE 4 % EX LIQD
60.0000 mL | Freq: Once | CUTANEOUS | Status: DC
  Filled 2013-07-29: qty 60

## 2013-07-29 NOTE — Interval H&P Note (Signed)
History and Physical Interval Note:  07/29/2013 2:12 PM  The patient has had recovery of her EF and does not wish to have ICD therapy going forward.  She had developed intercurrent complete heart block and symptoms with this.  She has a fidelis RV lead.  I agree with Dr Graciela Husbands that capping the RV lead with placement of a dual chamber pacemaker and removal of her ICD generator is appropriate. Risks, benefits, alternatives to this procedure were discussed in detail with the patient today. The patient understands that the risks include but are not limited to bleeding, infection, pneumothorax, perforation, tamponade, vascular damage, renal failure, MI, stroke, death,  and lead dislodgement and wishes to proceed. We will therefore proceed at this time.   [name]  has presented today for surgery, with the diagnosis of Heart block  The various methods of treatment have been discussed with the patient and family. After consideration of risks, benefits and other options for treatment, the patient has consented to  Procedure(s): PERMANENT PACEMAKER INSERTION (N/A) as a surgical intervention .  The patient's history has been reviewed, patient examined, no change in status, stable for surgery.  I have reviewed the patient's chart and labs.  Questions were answered to the patient's satisfaction.     Hillis Range

## 2013-07-29 NOTE — H&P (View-Only) (Signed)
ELECTROPHYSIOLOGY CONSULT NOTE    Patient ID: [name] MRN: [id number], DOB/AGE: 06/11/1928 79 y.o.  Admit date: 07/17/2013 Date of Consult: 07-21-2013  Primary Physician: [name], MD Primary Cardiologist: Armanda Magic, MD  Reason for Consultation: bradycardia  HPI:  [name] is a 79 year old female with a past medical history significant for coronary artery disease, ischemic cardiomyopathy (s/p single chamber ICD 2006 by Dr Amil Amen), diabetes, hypertension.  Her EF has improved since implant to 55%.(7/14)  She was seen by Dr Johney Frame in the office recently with plans to not change out her ICD when it reached ERI.  Since then, she has been more fatigued and was admitted 7-18.  At that time, she was found to be in heart block with ventricular pacing at 40bpm.  Her Coreg has been held now for 4 days without return of intrinsic conduction.  She feels some better presently.  Her device has been reprogrammed to VVI 60.  Her fatigue and shortness of breath have improved buthave not normalized  EP has been asked to evaluate for treatment options.  Of note, she has a 6949 RV lead which is currently functioning normally.   Past Medical History  Diagnosis Date  . Coronary artery disease   . Ischemic cardiomyopathy     s/p ICD (MDT) by Dr Amil Amen 2006  . Carotid stenosis   . Diabetes mellitus   . Hypertension   . Mitral regurgitation   . Dyslipidemia   . Cerebrovascular disease     extracranial  . Osteoporosis   . Chronic systolic dysfunction of left ventricle   . Fractured shoulder 2009    Right shoulder Fx. from a fall  . Acute on chronic systolic heart failure   . Chronic kidney disease, unspecified      Surgical History:  Past Surgical History  Procedure Laterality Date  . Cardiac defibrillator placement  2006    by Dr Amil Amen (MDT) she has a [phone] fidelis lead  . Cataract extraction    . Mitral valve repair    . Appendectomy    . Coronary artery bypass graft   2006  . Salpingoophorectomy  1976    bilateral  . Righ hip repla    . Total hip arthroplasty  2001    right  . Abdominal hysterectomy       Prescriptions prior to admission  Medication Sig Dispense Refill  . amLODipine (NORVASC) 5 MG tablet Take 10 mg by mouth daily.       . Calcium Carbonate-Vitamin D (CALTRATE 600+D) 600-400 MG-UNIT per tablet Take 1 tablet by mouth 2 (two) times daily.       . Cholecalciferol (VITAMIN D) 2000 UNITS tablet Take 2,000 Units by mouth daily.       . cyanocobalamin 2000 MCG tablet Take 2,000 mcg by mouth daily.       Marland Kitchen ezetimibe-simvastatin (VYTORIN) 10-20 MG per tablet Take 1 tablet by mouth at bedtime.       . fish oil-omega-3 fatty acids 1000 MG capsule Take 1 g by mouth 2 (two) times daily.       . furosemide (LASIX) 40 MG tablet Take 40 mg by mouth daily.       Marland Kitchen glipiZIDE (GLUCOTROL) 10 MG 24 hr tablet Take 20 mg by mouth daily.       . hydrALAZINE (APRESOLINE) 25 MG tablet 25 mg 2 (two) times daily.       . Multiple Vitamins-Minerals (CENTRUM SILVER PO) Take 1  tablet by mouth daily.       Marland Kitchen oxybutynin (DITROPAN-XL) 10 MG 24 hr tablet Take 10 mg by mouth daily.       . ramipril (ALTACE) 10 MG capsule Take 10 mg by mouth 2 (two) times daily.       . [DISCONTINUED] aspirin 325 MG EC tablet Take 325 mg by mouth daily.        . [DISCONTINUED] carvedilol (COREG) 12.5 MG tablet Take 12.5 mg by mouth 2 (two) times daily with a meal.        . glucose blood test strip daily.      . Lancets 30G MISC         Inpatient Medications:  . amLODipine  10 mg Oral Daily  . aspirin  325 mg Oral Daily  . ezetimibe  10 mg Oral q1800  . furosemide  40 mg Oral Daily  . glipiZIDE  20 mg Oral Daily  . heparin  5,000 Units Subcutaneous Q8H  . hydrALAZINE  25 mg Oral BID  . oxybutynin  10 mg Oral Daily  . potassium chloride  40 mEq Oral Daily  . ramipril  10 mg Oral BID  . simvastatin  20 mg Oral q1800  . sodium chloride  3 mL Intravenous Q12H    Allergies:    Allergies  Allergen Reactions  . Fosamax (Alendronate) Anaphylaxis  . Alendronate Sodium Other (See Comments)    Pt does not recall being allergic  . Clonidine Derivatives Other (See Comments)    Pt does not recall allergy  . Darvocet (Propoxyphene-Acetaminophen) Nausea And Vomiting  . Horse-Derived Products   . Sulfa Drugs Cross Reactors Hives and Itching  . Tramadol Nausea And Vomiting  . Vicodin (Hydrocodone-Acetaminophen) Other (See Comments)    lightheaded  . Penicillins Rash  . Septra (Sulfamethoxazole W-Trimethoprim) Rash    History   Social History  . Marital Status: Widowed    Spouse Name: N/A    Number of Children: N/A  . Years of Education: N/A   Occupational History  . retired Engineer, civil (consulting)    Social History Main Topics  . Smoking status: Never Smoker   . Smokeless tobacco: Not on file  . Alcohol Use: No  . Drug Use: No  . Sexually Active: Not on file   Other Topics Concern  . Not on file   Social History Narrative  . No narrative on file     Family History  Problem Relation Age of Onset  . Coronary artery disease      family hx of  . Hypertension      family hx of  . Diabetes      family hx of  . Cancer Mother     [name]   . Heart disease Mother   . Heart disease Sister   . Cancer Sister     BP 141/51  Pulse 61  Temp(Src) 98.2 F (36.8 C) (Oral)  Resp 20  Ht 5\' 2"  (1.575 m)  Wt 152 lb 9.6 oz (69.219 kg)  BMI 27.9 kg/m2  SpO2 100% Well developed and nourished in no acute distress HENT normal Neck supple with JVP-flat Carotids brisk and full without bruits Clear Regular rate and rhythm, no murmurs or gallops Abd-soft with active BS without hepatomegaly No Clubbing cyanosis edema Skin-warm and dry A & Oriented  Grossly normal sensory and motor function   Labs:   Lab Results  Component Value Date   WBC 7.9 07/17/2013   HGB 10.5* 07/17/2013  HCT 31.2* 07/17/2013   MCV 95.1 07/17/2013   PLT 133* 07/17/2013    Recent Labs Lab  07/17/13 0847  07/20/13 0820  NA 141  < > 139  K 4.4  < > 4.5  CL 109  < > 103  CO2 21  < > 20  BUN 28*  < > 27*  CREATININE 1.54*  < > 1.42*  CALCIUM 9.3  < > 9.4  PROT 6.2  --   --   BILITOT 0.5  --   --   ALKPHOS 50  --   --   ALT 39*  --   --   AST 37  --   --   GLUCOSE 186*  < > 146*  < > = values in this interval not displayed.  Radiology/Studies: Dg Chest 2 View 07/15/2013   *RADIOLOGY REPORT*  Clinical Data: Cough, shortness of breath, history of CHF and prior CABG with pacer and defibrillator  CHEST - 2 VIEW  Comparison: Chest x-ray of 01/22/2010  Findings: There is cardiomegaly present with small pleural effusions and mild pulmonary vascular congestion consistent with mild CHF.  Defibrillator leads remain.  Median sternotomy sutures are noted.  There are degenerative changes in both shoulders.  IMPRESSION: Mild CHF with small effusions.   Original Report Authenticated By: Dwyane Dee, M.D.   [id number] rhythm with complete heart block, v pacing  TELEMETRY: sinus rhythm with complete heart block, ventricular pacing  DEVICE HISTORY: single chamber MDT ICD implanted in 2007 by Dr Ty Hilts with [phone] lead  Principal Problem:   Atrioventricular block, complete Active Problems:   CAD (coronary artery disease)   6949 lead   Acute on chronic diastolic heart failure   The pt presents with CHB which  Is progressive from her LBBB and now has resulted in V pacing initially at 40>>60.  She is better but still symptomatic   Her device will need revision With restoration of LV function, it is reasonable with no prior VT therapy to transition her device from ICD>>PM.   She has 6949 lead, so the R/S portion of her ICD lead will need to be replaced She is not back to baseline ,but it is reasonable to allow her to go home and arrange for Dr Fawn Kirk to do next week when he returns If her symptoms dictate otherwise, we will gladly accomodate

## 2013-07-29 NOTE — Brief Op Note (Signed)
ICD pulse generator removed. Medtronic Adapta L DR implanted with new RA and RV leads inserted. No apparently complications.  See dictation # [id number]

## 2013-07-29 NOTE — Progress Notes (Signed)
High BP in flowsheet secondary to cuff on right leg; BP wnl on R arm.

## 2013-07-30 ENCOUNTER — Ambulatory Visit (HOSPITAL_COMMUNITY): Payer: Medicare Other

## 2013-07-30 DIAGNOSIS — I442 Atrioventricular block, complete: Secondary | ICD-10-CM

## 2013-07-30 LAB — BASIC METABOLIC PANEL
CO2: 20 mEq/L (ref 19–32)
Chloride: 105 mEq/L (ref 96–112)
GFR calc Af Amer: 36 mL/min — ABNORMAL LOW (ref >90)
Potassium: 4.6 mEq/L (ref 3.5–5.1)

## 2013-07-30 MED ORDER — CARVEDILOL 12.5 MG PO TABS: 12.5000 mg | ORAL_TABLET | Freq: Two times a day (BID) | ORAL | Status: DC

## 2013-07-30 MED ORDER — LIVING WELL WITH DIABETES BOOK
Freq: Once | Status: AC
  Administered 2013-07-30: 06:00:00
  Filled 2013-07-30: qty 1

## 2013-07-30 MED ORDER — YOU HAVE A PACEMAKER BOOK
Freq: Once | Status: AC
  Administered 2013-07-30: 04:00:00
  Filled 2013-07-30: qty 1

## 2013-07-30 NOTE — Discharge Summary (Signed)
ELECTROPHYSIOLOGY PROCEDURE DISCHARGE SUMMARY    Patient ID: [name],  MRN: [id number], DOB/AGE: 07-Dec-1928 79 y.o.  Admit date: 07/29/2013 Discharge date: 07/30/2013  Primary Care Physician: Cam Hai, MD Primary Cardiologist: Armanda Magic, MD Electrophysiologist: Hillis Range, MD  Primary Discharge Diagnosis:  Complete heart block status post dual chamber pacemaker implant this admission with capping of previously implanted ICD lead.   Secondary Discharge Diagnosis:  1.  Coronary artery disease 2.  Diabetes 3.  Hypertension 4.  Ischemic cardiomyopathy status post ICD implantation by Dr Ty Hilts for primary prevention in 2006 with Medtronic Sprint Fidelis lead (EF now improved to 55%) 5.  Valvular disease status post mitral valve repair  Procedures This Admission:  1.  Implantation of a dual chamber pacemaker by Dr Johney Frame on 07-29-2013.  The patient received a Medtronic Adapta pacemaker with model number 5076 right atrial and right ventricular leads.  The previously implanted 6949 lead was capped.  The patient had no early apparent complications. 2.  CXR on 07-30-2013 demonstrated no obvious ptx and stable lead position  Brief HPI: [name] is a pleasant 79 year old female with a history of complete heart block, who presents today for pacemaker  implantation. She previously had an ICD implanted on December 07, 2005, for primary prevention of sudden cardiac death. Her ejection fraction is subsequently recovered. She has a Sprint Fidelis lead. She recently presented to [facility] with symptomatic bradycardia and was found to have complete heart block and was pacing through her ICD at 40 beats per minute within the right ventricle. She now returns electively for ICD pulse generator removal and placement of a dual-chamber pacemaker.  Hospital Course:  The patient was admitted and underwent implantation of a dual chamber pacemaker with details as outlined above.   She was  monitored on telemetry overnight which demonstrated P synchronous pacing.  Left chest was without hematoma or ecchymosis.  The device was interrogated and found to be functioning normally.  CXR was obtained and demonstrated no pneumothorax status post device implantation.  Wound care, arm mobility, and restrictions were reviewed with the patient.  Dr Johney Frame examined the patient and considered them stable for discharge to home.    Physical Exam: Filed Vitals:   07/29/13 2000 07/29/13 2353 07/30/13 0005 07/30/13 0450  BP: 100/39 142/47  133/40  Pulse: 80 82  76  Temp:  98.1 F (36.7 C)  98 F (36.7 C)  TempSrc:  Oral  Oral  Resp: 18 20  20   Height:      Weight:   141 lb 5 oz (64.1 kg)   SpO2: 92% 96%  97%    GEN- The patient is well appearing, alert and oriented x 3 today.   Head- normocephalic, atraumatic Eyes-  Sclera clear, conjunctiva pink Ears- hearing intact Oropharynx- clear Neck- supple, no JVP Lymph- no cervical lymphadenopathy Lungs- Clear to ausculation bilaterally, normal work of breathing Heart- Regular rate and rhythm, no murmurs, rubs or gallops, PMI not laterally displaced GI- soft, NT, ND, + BS Extremities- no clubbing, cyanosis, or edema MS- no significant deformity or atrophy Skin- pacemaker pocket had ecchymosis but no hematoma  Labs:   Lab Results  Component Value Date   WBC 7.9 07/17/2013   HGB 10.5* 07/17/2013   HCT 31.2* 07/17/2013   MCV 95.1 07/17/2013   PLT 133* 07/17/2013     Recent Labs Lab 07/30/13 0455  NA 137  K 4.6  CL 105  CO2 20  BUN 31*  CREATININE 1.49*  CALCIUM 10.0  GLUCOSE 134*    Discharge Medications:    Medication List         acetaminophen 500 MG tablet  Commonly known as:  TYLENOL  Take 500 mg by mouth daily as needed for pain.     amLODipine 5 MG tablet  Commonly known as:  NORVASC  Take 10 mg by mouth daily.     aspirin EC 81 MG tablet  Take 81 mg by mouth every morning.     CALTRATE 600+D 600-400 MG-UNIT  per tablet  Generic drug:  Calcium Carbonate-Vitamin D  Take 1 tablet by mouth 2 (two) times daily.     CENTRUM SILVER PO  Take 1 tablet by mouth daily.     ezetimibe-simvastatin 10-20 MG per tablet  Commonly known as:  VYTORIN  Take 1 tablet by mouth at bedtime.     fish oil-omega-3 fatty acids 1000 MG capsule  Take 1 g by mouth 2 (two) times daily.     furosemide 40 MG tablet  Commonly known as:  LASIX  Take 40 mg by mouth daily.     glipiZIDE 10 MG 24 hr tablet  Commonly known as:  GLUCOTROL XL  Take 20 mg by mouth daily.     hydrALAZINE 25 MG tablet  Commonly known as:  APRESOLINE  Take 25 mg by mouth 2 (two) times daily.     meclizine 25 MG tablet  Commonly known as:  ANTIVERT  Take 25 mg by mouth daily as needed for dizziness.     oxybutynin 10 MG 24 hr tablet  Commonly known as:  DITROPAN-XL  Take 10 mg by mouth daily.     potassium chloride SA 20 MEQ tablet  Commonly known as:  K-DUR,KLOR-CON  Take 20 mEq by mouth daily.     ramipril 10 MG capsule  Commonly known as:  ALTACE  Take 10 mg by mouth 2 (two) times daily.     Vitamin D 2000 UNITS tablet  Take 2,000 Units by mouth daily.       Restart prior coreg dosing  Disposition:   Future Appointments Provider Department Dept Phone   08/10/2013 3:00 PM [facility] [employer] Main Office [place]) [phone]   08/11/2013 9:45 AM [facility] [place] MAMMOGRAPHY [phone]   Patient should wear two piece clothing and wear no powder or deodorant. Patient should arrive 15 minutes early.   10/20/2013 10:30 AM [facility] [phone]   10/20/2013 11:40 AM Evern Bio, NP [facility] [phone]     Follow-up Information   Follow up with [facility] On 08/10/2013. (At 3:00 PM for wound check)    Contact information:   [address] [place]  [address] [phone]      Duration of Discharge Encounter: Greater than 30 minutes including physician time.  Signed,  Hillis Range, MD

## 2013-07-30 NOTE — Op Note (Signed)
[name], [name] NO.:  [id number]  MEDICAL RECORD NO.:  [id number]  LOCATION:  6C03C                        FACILITY:  [facility]  PHYSICIAN:  Hillis Range, MD       DATE OF BIRTH:  [birth year]  DATE OF PROCEDURE: DATE OF DISCHARGE:                              OPERATIVE REPORT   SURGEON:  Hillis Range, MD  PREPROCEDURE DIAGNOSES: 1. Complete heart block. 2. Prior implantable cardioverter-defibrillator implanted for     nonischemic cardiomyopathy with subsequent recovery of ejection     fraction.  POSTPROCEDURE DIAGNOSES: 1. Complete heart block. 2. Prior implantable cardioverter-defibrillator implanted for     nonischemic cardiomyopathy with subsequent recovery of ejection     fraction.  PROCEDURES: 1. Left upper extremity venography. 2. Defibrillator, pulse generator removal. 3. Dual-chamber pacemaker implantation.  INTRODUCTION:  [name] is a pleasant 79 year old female with a history of complete heart block, who presents today for pacemaker implantation.  She previously had an ICD implanted on December 07, 2005, for primary prevention of sudden cardiac death.  Her ejection fraction is subsequently recovered.  She has a Sprint Fidelis lead.  She recently presented to [facility] with symptomatic bradycardia and was found to have complete heart block and was pacing through her ICD at 40 beats per minute within the right ventricle.  She now returns electively for ICD pulse generator removal and placement of a dual-chamber pacemaker.  DESCRIPTION OF PROCEDURE:  Informed written consent was obtained and the patient was brought to the Electrophysiology Lab in the fasting state. Her ICD was interrogated and she was found to have complete heart block today with ventricular pacing at 60 beats per minute.  Tachycardia therapies were programmed off.  A venogram of the left upper extremity was performed, which revealed a large and patent left axillary  vein, which emptied into a large and patent left subclavian vein.  The left cephalic vein was also patent.  The patient's chest was prepped and draped in the usual sterile fashion by the EP Lab staff.  The skin overlying her defibrillator pocket was infiltrated with lidocaine for local analgesia.  A 4-cm incision was made over the defibrillator.  The defibrillator was removed and disconnected from the lead.  As the lead was a Medtronic Fidelis lead, I felt that it was most prudent to place a new ventricular lead as I did not feel that this lead would be reliable long-term due to frequent fractures with this particular lead.  The left axillary vein was therefore cannulated.  Through the left axillary vein, a Medtronic model [id number] (serial number [id number]) right atrial lead and a Medtronic model [phone] (serial number [id number]) right ventricular lead were advanced with fluoroscopic visualization into the right atrial appendage and right ventricular apex positions respectively.  Initial atrial lead P-waves measured 2.1 millivolts with impedance of 531 ohms and a threshold of 0.9 volts at 0.5 milliseconds. Right ventricular lead R-waves measured 12 millivolts with impedance of 911 ohms and a threshold of 0.6 volts at 0.5 milliseconds.  Both leads were secured to the pectoralis fascia using #2 silk suture over the suture sleeves.  The leads were then connected to a  Medtronic Adapta L, model ADDDRL1 (serial number [id number]) pacemaker.  The previous Fidelis lead was capped off and returned to the pocket.  The pocket was revised to accommodate the new device.  The pocket was then irrigated with copious gentamicin solution.  The pocket was then closed in two layers with 2-0 Vicryl suture for the subcutaneous and subcuticular layers.  Steri-Strips and a sterile dressing were then applied.  CONCLUSIONS: 1. Successful defibrillator removal with a upgrade to a dual-chamber     pacemaker  today. 2. The previously implanted Sprint Fidelis lead was capped off and     returned to the pocket. 3. No early apparent complications.     Hillis Range, MD     JA/MEDQ  D:  07/29/2013  T:  07/30/2013  Job:  [id number]  cc:   Alyssa Crafts, MD

## 2013-08-10 ENCOUNTER — Ambulatory Visit: Payer: Medicare Other

## 2013-08-11 ENCOUNTER — Ambulatory Visit (HOSPITAL_COMMUNITY): Payer: Medicare Other

## 2013-08-19 ENCOUNTER — Encounter: Payer: Self-pay | Admitting: Internal Medicine

## 2013-08-19 ENCOUNTER — Ambulatory Visit (INDEPENDENT_AMBULATORY_CARE_PROVIDER_SITE_OTHER): Payer: Medicare Other | Admitting: *Deleted

## 2013-08-19 DIAGNOSIS — I442 Atrioventricular block, complete: Secondary | ICD-10-CM

## 2013-08-19 LAB — PACEMAKER DEVICE OBSERVATION
AL AMPLITUDE: 2.8 mv
AL IMPEDENCE PM: 467 Ohm
BATTERY VOLTAGE: 2.79 V
RV LEAD IMPEDENCE PM: 618 Ohm
VENTRICULAR PACING PM: 100

## 2013-08-19 NOTE — Progress Notes (Signed)
Wound check-PPM in office. 

## 2013-10-06 ENCOUNTER — Other Ambulatory Visit (HOSPITAL_COMMUNITY): Payer: Self-pay | Admitting: Family Medicine

## 2013-10-06 DIAGNOSIS — Z1231 Encounter for screening mammogram for malignant neoplasm of breast: Secondary | ICD-10-CM

## 2013-10-16 ENCOUNTER — Ambulatory Visit (HOSPITAL_COMMUNITY)
Admission: RE | Admit: 2013-10-16 | Discharge: 2013-10-16 | Disposition: A | Discharge status: Home / Self Care | Payer: Medicare Other | Source: Ambulatory Visit | Attending: Family Medicine | Admitting: Family Medicine

## 2013-10-16 ENCOUNTER — Encounter: Payer: Self-pay | Admitting: Family

## 2013-10-16 DIAGNOSIS — Z1231 Encounter for screening mammogram for malignant neoplasm of breast: Secondary | ICD-10-CM | POA: Insufficient documentation

## 2013-10-19 ENCOUNTER — Ambulatory Visit (INDEPENDENT_AMBULATORY_CARE_PROVIDER_SITE_OTHER): Payer: Medicare Other | Admitting: Family

## 2013-10-19 ENCOUNTER — Encounter: Payer: Self-pay | Admitting: Family

## 2013-10-19 ENCOUNTER — Ambulatory Visit (HOSPITAL_COMMUNITY)
Admission: RE | Admit: 2013-10-19 | Discharge: 2013-10-19 | Disposition: A | Discharge status: Home / Self Care | Payer: Medicare Other | Source: Ambulatory Visit | Attending: Family | Admitting: Family

## 2013-10-19 DIAGNOSIS — I6529 Occlusion and stenosis of unspecified carotid artery: Secondary | ICD-10-CM | POA: Insufficient documentation

## 2013-10-19 NOTE — Progress Notes (Signed)
Patient ID: [name], female   DOB: [birth year], 79 y.o.   MRN: [id number] Says her left arm has been numb since surgery especially after sleeping. She has to"wiggle it around" before it gets back to its normal self.

## 2013-10-19 NOTE — Patient Instructions (Signed)
Stroke Prevention Some medical conditions and behaviors are associated with an increased chance of having a stroke. You may prevent a stroke by making healthy choices and managing medical conditions. Reduce your risk of having a stroke by:  Staying physically active. Get at least 30 minutes of activity on most or all days.  Not smoking. It may also be helpful to avoid exposure to secondhand smoke.  Limiting alcohol use. Moderate alcohol use is considered to be:  No more than 2 drinks per day for men.  No more than 1 drink per day for nonpregnant women.  Eating healthy foods.  Include 5 or more servings of fruits and vegetables a day.  Certain diets may be prescribed to address high blood pressure, high cholesterol, diabetes, or obesity.  Managing your cholesterol levels.  A low-saturated fat, low-trans fat, low-cholesterol, and high-fiber diet may control cholesterol levels.  Take any prescribed medicines to control cholesterol as directed by your caregiver.  Managing your diabetes.  A controlled-carbohydrate, controlled-sugar diet is recommended to manage diabetes.  Take any prescribed medicines to control diabetes as directed by your caregiver.  Controlling your high blood pressure (hypertension).  A low-salt (sodium), low-saturated fat, low-trans fat, and low-cholesterol diet is recommended to manage high blood pressure.  Take any prescribed medicines to control hypertension as directed by your caregiver.  Maintaining a healthy weight.  A reduced-calorie, low-sodium, low-saturated fat, low-trans fat, low-cholesterol diet is recommended to manage weight.  Stopping drug abuse.  Avoiding birth control pills.  Talk to your caregiver about the risks of taking birth control pills if you are over 35 years old, smoke, get migraines, or have ever had a blood clot.  Getting evaluated for sleep disorders (sleep apnea).  Talk to your caregiver about getting a sleep evaluation  if you snore a lot or have excessive sleepiness.  Taking medicines as directed by your caregiver.  For some people, aspirin or blood thinners (anticoagulants) are helpful in reducing the risk of forming abnormal blood clots that can lead to stroke. If you have the irregular heart rhythm of atrial fibrillation, you should be on a blood thinner unless there is a good reason you cannot take them.  Understand all your medicine instructions. SEEK IMMEDIATE MEDICAL CARE IF:   You have sudden weakness or numbness of the face, arm, or leg, especially on one side of the body.  You have sudden confusion.  You have trouble speaking (aphasia) or understanding.  You have sudden trouble seeing in one or both eyes.  You have sudden trouble walking.  You have dizziness.  You have a loss of balance or coordination.  You have a sudden, severe headache with no known cause.  You have new chest pain or an irregular heartbeat. Any of these symptoms may represent a serious problem that is an emergency. Do not wait to see if the symptoms will go away. Get medical help right away. Call your local emergency services (911 in [place] Do not drive yourself to the hospital. Document Released: 01/24/2005 Document Revised: 03/10/2012 Document Reviewed: 08/06/2011 [employer] Patient Information 2014 [employer].  

## 2013-10-19 NOTE — Progress Notes (Signed)
Established Carotid Patient  History of Present Illness  [name] is a 79 y.o. female patient of Dr. Arbie Cookey with no history of carotid intervention, has known right ICA occlusion and mild left ICA stenosis.  She states she was told that she had a mild stroke after her 3 vessel CABG and mitral valve replacement in 2006, but denies TIA or stroke symptoms since then. Pacemaker insertion July 29, 2013. Her cardiologist is Dr. Tillie Rung. Patient denies steal symptoms.  The patient denies amaurosis fugax or monocular blindness.  The patient reports mild left facial drooping since her CABG.  Pt. denies hemiplegia.  The patient denies receptive or expressive aphasia.  Pt. reports generalized mild extremity weakness. Her generalized weakness allows her to walk about 1/2 block.  The patient's previous neurologic deficits are Unchanged.  Patient  denies New Medical  History.  Pt Diabetic: Yes, states her sugar is high Pt smoker: non-smoker  Pt meds include: Statin : Yes Betablocker: Yes ASA: Yes Other anticoagulants/antiplatelets: no   Past Medical History  Diagnosis Date  . Coronary artery disease   . Ischemic cardiomyopathy     s/p ICD (MDT) by Dr Amil Amen 2006  . Carotid stenosis   . Hypertension   . Mitral regurgitation   . Dyslipidemia   . Cerebrovascular disease     extracranial  . Osteoporosis   . Chronic systolic dysfunction of left ventricle   . Fractured shoulder 2009    Right shoulder Fx. from a fall  . Acute on chronic systolic heart failure   . Automatic implantable cardioverter-defibrillator in situ     placed in 2006; removed on 07/29/2013 (07/29/2013)  . Pacemaker 07/29/2013    Medtronic Adapta L DR implanted with new RA and RV leads inserted  . Dysrhythmia   . Exertional shortness of breath   . Type II diabetes mellitus   . Stroke 07/2005    "when they took me off heart/lung machine; affected my left foot" (07/29/2013)  . Arthritis     "left wrist; back"  (07/29/2013)  . Anxiety     "when I come to the hospital; don't take any RX" (07/29/2013)  . Chronic kidney disease, unspecified   . Overactive bladder   . CHF (congestive heart failure)     Social History History  Substance Use Topics  . Smoking status: Never Smoker   . Smokeless tobacco: Never Used  . Alcohol Use: No    Family History Family History  Problem Relation Age of Onset  . Coronary artery disease      family hx of  . Hypertension      family hx of  . Diabetes      family hx of  . Cancer Mother     Raj Janus   . Heart disease Mother   . Heart disease Sister   . Cancer Sister     Surgical History Past Surgical History  Procedure Laterality Date  . Cardiac defibrillator placement  11/2005    by Dr Amil Amen (MDT) she has a [phone] fidelis lead  . Cataract extraction w/ intraocular lens  implant, bilateral Bilateral 1980's  . Mitral valve replacement  07/2005  . Coronary artery bypass graft  07/2005    "CABG X3" (07/29/2013)  . Salpingoophorectomy Bilateral 1976  . Total hip arthroplasty Right 2001  . Cardiac catheterization  07/2005  . Cardiac valve replacement  07/2005    "mitral valve" (07/29/2013)  . Appendectomy  1949  . Abdominal hysterectomy  ? 1974  .  Shoulder arthroscopy w/ rotator cuff repair Right 1998  . Insert / replace / remove pacemaker  07/29/2013    Medtronic Adapta L DR implanted with new RA and RV leads inserted    Allergies  Allergen Reactions  . Fosamax [Alendronate] Anaphylaxis  . Alendronate Sodium Other (See Comments)    Pt does not recall being allergic  . Clonidine Derivatives Other (See Comments)    Pt does not recall allergy  . Darvocet [Propoxyphene-Acetaminophen] Nausea And Vomiting  . Horse-Derived Products   . Sulfa Drugs Cross Reactors Hives and Itching  . Tramadol Nausea And Vomiting  . Vicodin [Hydrocodone-Acetaminophen] Other (See Comments)    lightheaded  . Penicillins Rash  . Septra [Sulfamethoxazole-Trimethoprim] Rash     Current Outpatient Prescriptions  Medication Sig Dispense Refill  . acetaminophen (TYLENOL) 500 MG tablet Take 500 mg by mouth daily as needed for pain.      Marland Kitchen amLODipine (NORVASC) 5 MG tablet Take 10 mg by mouth daily.       Marland Kitchen aspirin EC 81 MG tablet Take 81 mg by mouth every morning.      . Calcium Carbonate-Vitamin D (CALTRATE 600+D) 600-400 MG-UNIT per tablet Take 1 tablet by mouth 2 (two) times daily.       . carvedilol (COREG) 12.5 MG tablet Take 1 tablet (12.5 mg total) by mouth 2 (two) times daily with a meal.  60 tablet  4  . Cholecalciferol (VITAMIN D) 2000 UNITS tablet Take 2,000 Units by mouth daily.       Marland Kitchen ezetimibe-simvastatin (VYTORIN) 10-20 MG per tablet Take 1 tablet by mouth at bedtime.       . fish oil-omega-3 fatty acids 1000 MG capsule Take 1 g by mouth 2 (two) times daily.       . furosemide (LASIX) 40 MG tablet Take 40 mg by mouth daily.       Marland Kitchen glipiZIDE (GLUCOTROL XL) 10 MG 24 hr tablet Take 20 mg by mouth daily.      . hydrALAZINE (APRESOLINE) 25 MG tablet Take 25 mg by mouth 2 (two) times daily.       . meclizine (ANTIVERT) 25 MG tablet Take 25 mg by mouth daily as needed for dizziness.      . Multiple Vitamins-Minerals (CENTRUM SILVER PO) Take 1 tablet by mouth daily.       Marland Kitchen oxybutynin (DITROPAN-XL) 10 MG 24 hr tablet Take 10 mg by mouth daily.       . potassium chloride SA (K-DUR,KLOR-CON) 20 MEQ tablet Take 20 mEq by mouth daily.      . ramipril (ALTACE) 10 MG capsule Take 10 mg by mouth 2 (two) times daily.        No current facility-administered medications for this visit.    Review of Systems : [x]  Positive   [ ]  Denies  General:[ ]  Weight loss,  [ ]  Weight gain, [ ]  Loss of appetite, [ ]  Fever, [ ]  chills  Neurologic: [ ]  Dizziness, [ ]  Blackouts, [ ]  Headaches, [ ]  Seizure [ ]  Stroke, [ ]  "Mini stroke", [ ]  Slurred speech, [ ]  Temporary blindness;  [ ] weakness,  Ear/Nose/Throat: [ ]  Change in hearing, [ ]  Nose bleeds, [ ]   Hoarseness  Vascular:[ ]  Pain in legs with walking, [ ]  Pain in feet while lying flat , [ ]   Non-healing ulcer, [ ]  Blood clot in vein,    Pulmonary: [ ]  Home oxygen, [ ]   Productive cough, [ ]  Bronchitis, [ ]   Coughing up blood,  [ ]  Asthma, [ ]  Wheezing  Musculoskeletal:  [ ]  Arthritis, [ ]  Joint pain, [ ]  low back pain  Cardiac: [ ]  Chest pain, [ ]  Shortness of breath when lying flat, [ ]  Shortness of breath with exertion, [ ]  Palpitations, [ ]  Heart murmur, [ ]   Atrial fibrillation  Hematologic:[ ]  Easy Bruising, [ ]  Anemia; [ ]  Hepatitis  Psychiatric: [ ]   Depression, [ ]  Anxiety   Gastrointestinal: [ ]  Black stool, [ ]  Blood in stool, [ ]  Peptic ulcer disease,  [ ]  Gastroesophageal Reflux, [ ]  Trouble swallowing, [ ]  Diarrhea, [ ]  Constipation  Urinary: [ ]  chronic Kidney disease, [ ]  on HD, [ ]  Burning with urination, [ ]  Frequent urination, [ ]  Difficulty urinating;   Skin: [ ]  Rashes, [ ]  Wounds    Physical Examination  Filed Vitals:   10/19/13 1223  BP: 128/79  Pulse: 80  Resp:    Filed Weights   10/19/13 1222  Weight: 150 lb (68.04 kg)   Body mass index is 27.43 kg/(m^2).   General: WDWN female in NAD GAIT: slow and deliberate. Eyes: PERRLA Pulmonary:  CTAB, Negative  Rales, Negative rhonchi, & Negative wheezing.  Cardiac: Regular  Rhythm ,  No detected  Murmurs.  VASCULAR EXAM Carotid Bruits Left Right   Negative Negative    Aorta is not palpable. Radial pulses are 2+ palpable and  equal.                                                                                                                            LE Pulses LEFT RIGHT       FEMORAL  not  palpable  not  palpable        POPLITEAL  not palpable   not palpable       POSTERIOR TIBIAL  not  palpable   not  palpable        DORSALIS PEDIS      ANTERIOR TIBIAL  palpable   not palpable     Gastrointestinal: soft, nontender, BS WNL, no r/g,  negative masses.  Musculoskeletal: Negative  muscle atrophy/wasting. M/S 4/5 in UE's, 3/5 in LE's, Extremities without ischemic changes.  Neurologic: A&O X 3; Appropriate Affect ; SENSATION ;normal;  Speech is normal CN 2-12 intact, Pain and light touch intact in extremities, Motor exam as listed above.   Non-Invasive Vascular Imaging CAROTID DUPLEX 10/19/2013   Right ICA: Occluded. Left ICA: <40% stenosis.  These findings are Unchanged from previous exam.  Assessment: [name] is a 79 y.o. female who presents with asymptomatic minimal left ICA stenosis and known right ICA occlusion. The  ICA stenosis is  Unchanged from previous exam.  Plan: Follow-up in 1 year with Carotid Duplex scan.   I discussed in depth with the patient the nature of atherosclerosis, and emphasized the importance of maximal medical management including strict control of blood pressure, blood glucose,  and lipid levels, obtaining regular exercise, and continued cessation of smoking.  The patient is aware that without maximal medical management the underlying atherosclerotic disease process will progress, limiting the benefit of any interventions. The patient was given information about stroke prevention and what symptoms should prompt the patient to seek immediate medical care. Thank you for allowing [place] to participate in this patient's care.  Charisse March, RN, MSN, FNP-C [facility] Office: [phone]  Clinic Physician: Hart Rochester  10/19/2013 12:46 PM

## 2013-10-20 ENCOUNTER — Ambulatory Visit: Payer: Medicare Other | Admitting: Neurosurgery

## 2013-10-20 ENCOUNTER — Other Ambulatory Visit: Payer: Medicare Other

## 2013-10-29 ENCOUNTER — Encounter: Payer: Self-pay | Admitting: Internal Medicine

## 2013-10-29 ENCOUNTER — Ambulatory Visit (INDEPENDENT_AMBULATORY_CARE_PROVIDER_SITE_OTHER): Payer: Medicare Other | Admitting: Internal Medicine

## 2013-10-29 ENCOUNTER — Other Ambulatory Visit: Payer: Self-pay | Admitting: Cardiology

## 2013-10-29 VITALS — BP 130/70 | HR 82 | Ht 62.0 in | Wt 152.8 lb

## 2013-10-29 DIAGNOSIS — I4891 Unspecified atrial fibrillation: Secondary | ICD-10-CM

## 2013-10-29 DIAGNOSIS — I480 Paroxysmal atrial fibrillation: Secondary | ICD-10-CM | POA: Insufficient documentation

## 2013-10-29 DIAGNOSIS — I442 Atrioventricular block, complete: Secondary | ICD-10-CM

## 2013-10-29 DIAGNOSIS — I10 Essential (primary) hypertension: Secondary | ICD-10-CM

## 2013-10-29 LAB — PACEMAKER DEVICE OBSERVATION
ATRIAL PACING PM: 81
BAMS-0001: 140 {beats}/min
BATTERY VOLTAGE: 2.79 V
RV LEAD IMPEDENCE PM: 538 Ohm
RV LEAD THRESHOLD: 0.5 V
VENTRICULAR PACING PM: 100

## 2013-10-29 NOTE — Progress Notes (Signed)
PCP: Alyssa Raider, MD Primary Cardiologist:  [name] is a 79 y.o. female who presents today for routine electrophysiology followup.  Since her upgrade from single chamber ICD to dual chamber PPM, the patient reports doing very well.  Today, she denies symptoms of palpitations, chest pain, shortness of breath,  lower extremity edema, dizziness, presyncope, or syncope.  The patient is otherwise without complaint today.   Past Medical History  Diagnosis Date  . Coronary artery disease   . Ischemic cardiomyopathy     s/p ICD (MDT) by Dr Amil Amen 2006  . Carotid stenosis   . Hypertension   . Mitral regurgitation   . Dyslipidemia   . Cerebrovascular disease     extracranial  . Osteoporosis   . Chronic systolic dysfunction of left ventricle   . Fractured shoulder 2009    Right shoulder Fx. from a fall  . Acute on chronic systolic heart failure   . Automatic implantable cardioverter-defibrillator in situ     placed in 2006; removed on 07/29/2013 (07/29/2013)  . Pacemaker 07/29/2013    Medtronic Adapta L DR implanted with new RA and RV leads inserted  . Dysrhythmia   . Exertional shortness of breath   . Type II diabetes mellitus   . Stroke 07/2005    "when they took me off heart/lung machine; affected my left foot" (07/29/2013)  . Arthritis     "left wrist; back" (07/29/2013)  . Anxiety     "when I come to the hospital; don't take any RX" (07/29/2013)  . Chronic kidney disease, unspecified   . Overactive bladder   . CHF (congestive heart failure)   . Paroxysmal atrial fibrillation    Past Surgical History  Procedure Laterality Date  . Cardiac defibrillator placement  11/2005    by Dr Amil Amen (MDT) she has a [phone] fidelis lead  . Cataract extraction w/ intraocular lens  implant, bilateral Bilateral 1980's  . Mitral valve replacement  07/2005  . Coronary artery bypass graft  07/2005    "CABG X3" (07/29/2013)  . Salpingoophorectomy Bilateral 1976  . Total hip arthroplasty  Right 2001  . Cardiac catheterization  07/2005  . Cardiac valve replacement  07/2005    "mitral valve" (07/29/2013)  . Appendectomy  1949  . Abdominal hysterectomy  ? 1974  . Shoulder arthroscopy w/ rotator cuff repair Right 1998  . Pacemaker placement  07/29/2013    upgrade of single chamber ICD to Medtronic Adapta L DR implanted with new RA and RV leads inserted    Current Outpatient Prescriptions  Medication Sig Dispense Refill  . acetaminophen (TYLENOL) 500 MG tablet Take 500 mg by mouth daily as needed for pain.      Marland Kitchen amLODipine (NORVASC) 5 MG tablet Take 10 mg by mouth daily.       Marland Kitchen aspirin EC 81 MG tablet Take 81 mg by mouth every morning.      . Calcium Carbonate-Vitamin D (CALTRATE 600+D) 600-400 MG-UNIT per tablet Take 1 tablet by mouth 2 (two) times daily.       . carvedilol (COREG) 12.5 MG tablet Take 6.25 mg by mouth 2 (two) times daily with a meal.      . ezetimibe-simvastatin (VYTORIN) 10-20 MG per tablet Take 1 tablet by mouth at bedtime.       . fish oil-omega-3 fatty acids 1000 MG capsule Take 1 g by mouth 2 (two) times daily.       . furosemide (LASIX) 40 MG tablet Take  40 mg by mouth daily.       Marland Kitchen glipiZIDE (GLUCOTROL XL) 10 MG 24 hr tablet Take 20 mg by mouth daily.      . hydrALAZINE (APRESOLINE) 25 MG tablet Take 25 mg by mouth 2 (two) times daily.       . meclizine (ANTIVERT) 25 MG tablet Take 25 mg by mouth as needed for dizziness.       . Multiple Vitamins-Minerals (CENTRUM SILVER PO) Take 1 tablet by mouth daily.       Marland Kitchen oxybutynin (DITROPAN-XL) 10 MG 24 hr tablet Take 10 mg by mouth daily.       . ramipril (ALTACE) 10 MG capsule Take 10 mg by mouth daily.       . Cholecalciferol (VITAMIN D) 2000 UNITS tablet Take 2,000 Units by mouth daily.       . potassium chloride SA (K-DUR,KLOR-CON) 20 MEQ tablet Take 20 mEq by mouth daily.       No current facility-administered medications for this visit.    Physical Exam: Filed Vitals:   10/29/13 1434  BP: 130/70   Pulse: 82  Height: 5\' 2"  (1.575 m)  Weight: 152 lb 12.8 oz (69.31 kg)    GEN- The patient is well appearing, alert and oriented x 3 today.   Head- normocephalic, atraumatic Eyes-  Sclera clear, conjunctiva pink Ears- hearing intact Oropharynx- clear Lungs- Clear to ausculation bilaterally, normal work of breathing Chest- pacemaker pocket is well healed Heart- Regular rate and rhythm, no murmurs, rubs or gallops, PMI not laterally displaced GI- soft, NT, ND, + BS Extremities- no clubbing, cyanosis, or edema  Pacemaker interrogation- reviewed in detail today,  See PACEART report  Assessment and Plan:  1. Transient complete heart block Today, she has 1:1 AV conduction Normal pacemaker function See Arita Miss Art report Programmed to MVP on today  2. afib Device interrogation reveals afib on 8/14 lasting 11 hours. Her CHADS2VASC score is at least 8.  I have therefore strongly recommended anticoagulation. Today, I discussed coumadin novel anticoagulants including pradaxa, xarelto, and eliquis today as indicated for risk reduction in stroke and systemic emboli with nonvalvular atrial fibrillation.  She reports "I am scared of coumadin" and that she cannot afford a NOAC.  She is clear that she will continue ASA at this time, though she recognizes the increased risks of stroke.  I would favor eliquis 2.5mg  BID going forward if she changes her mind. She can discuss further with Dr Mayford Knife upon follow-up  3. HTN Stable No change required today   [facility] Return to see me in 9 months

## 2013-10-29 NOTE — Patient Instructions (Signed)
Your physician wants you to follow-up in: July with Dr Johney Frame Bonita Quin will receive a reminder letter in the mail two months in advance. If you don't receive a letter, please call our office to schedule the follow-up appointment.   Remote monitoring is used to monitor your Pacemaker or ICD from home. This monitoring reduces the number of office visits required to check your device to one time per year. It allows [place] to keep an eye on the functioning of your device to ensure it is working properly. You are scheduled for a device check from home on 02/01/14. You may send your transmission at any time that day. If you have a wireless device, the transmission will be sent automatically. After your physician reviews your transmission, you will receive a postcard with your next transmission date.

## 2013-11-07 ENCOUNTER — Encounter: Payer: Self-pay | Admitting: Interventional Cardiology

## 2013-11-09 ENCOUNTER — Ambulatory Visit (INDEPENDENT_AMBULATORY_CARE_PROVIDER_SITE_OTHER): Payer: Medicare Other | Admitting: Cardiology

## 2013-11-09 ENCOUNTER — Encounter: Payer: Self-pay | Admitting: Cardiology

## 2013-11-09 VITALS — BP 151/84 | HR 86 | Ht 62.0 in | Wt 153.0 lb

## 2013-11-09 DIAGNOSIS — I509 Heart failure, unspecified: Secondary | ICD-10-CM

## 2013-11-09 DIAGNOSIS — E785 Hyperlipidemia, unspecified: Secondary | ICD-10-CM

## 2013-11-09 DIAGNOSIS — I5042 Chronic combined systolic (congestive) and diastolic (congestive) heart failure: Secondary | ICD-10-CM | POA: Insufficient documentation

## 2013-11-09 DIAGNOSIS — I2510 Atherosclerotic heart disease of native coronary artery without angina pectoris: Secondary | ICD-10-CM

## 2013-11-09 DIAGNOSIS — I6529 Occlusion and stenosis of unspecified carotid artery: Secondary | ICD-10-CM | POA: Insufficient documentation

## 2013-11-09 DIAGNOSIS — E1129 Type 2 diabetes mellitus with other diabetic kidney complication: Secondary | ICD-10-CM | POA: Insufficient documentation

## 2013-11-09 DIAGNOSIS — I340 Nonrheumatic mitral (valve) insufficiency: Secondary | ICD-10-CM | POA: Insufficient documentation

## 2013-11-09 DIAGNOSIS — I10 Essential (primary) hypertension: Secondary | ICD-10-CM

## 2013-11-09 DIAGNOSIS — I059 Rheumatic mitral valve disease, unspecified: Secondary | ICD-10-CM

## 2013-11-09 DIAGNOSIS — I5032 Chronic diastolic (congestive) heart failure: Secondary | ICD-10-CM

## 2013-11-09 DIAGNOSIS — I442 Atrioventricular block, complete: Secondary | ICD-10-CM

## 2013-11-09 NOTE — Patient Instructions (Addendum)
4 weeks worth of samples of Vytorin 10-20 were given to you today.   Your physician recommends that you continue on your current medications as directed. Please refer to the Current Medication list given to you today.  Your physician wants you to follow-up in: 6 months with Dr Sherlyn Lick will receive a reminder letter in the mail two months in advance. If you don't receive a letter, please call our office to schedule the follow-up appointment.

## 2013-11-09 NOTE — Progress Notes (Signed)
[address] [place], [place]  [address] Phone: [phone] Fax:  [phone]  Date:  11/09/2013   ID:  [name], DOB 04-29-1928, MRN [id number]  PCP:  [name], MD  Cardiologist:  Armanda Magic, MD     History of Present Illness: [name] is a 79 y.o. female who presents today for followup of her ischemic DCM, chronic diastolic CHF, ASCAD, dyslipidemia, MR s/p MV repair,  and HTN.  She is doing well.  She denies any chest pain, SOB, DOE, LE edema, dizziness, palpitations or syncope.   Wt Readings from Last 3 Encounters:  11/09/13 153 lb (69.4 kg)  10/29/13 152 lb 12.8 oz (69.31 kg)  10/19/13 150 lb (68.04 kg)     Past Medical History  Diagnosis Date  . Ischemic cardiomyopathy     s/p ICD (MDT) by Dr Amil Amen 2006, EF 24% at the time but now 55%  . Mitral regurgitation   . Dyslipidemia   . Cerebrovascular disease     extracranial  . Osteoporosis   . Chronic systolic dysfunction of left ventricle   . Fractured shoulder 2009    Right shoulder Fx. from a fall  . Acute on chronic systolic heart failure     secondary to complete heart block  . Automatic implantable cardioverter-defibrillator in situ     placed in 2006; removed on 07/29/2013 (07/29/2013)  . Pacemaker 07/29/2013    Medtronic Adapta L DR implanted with new RA and RV leads inserted  . Exertional shortness of breath   . Stroke 07/2005    "when they took me off heart/lung machine; affected my left foot" (07/29/2013)  . Arthritis     "left wrist; back" (07/29/2013)  . Anxiety     "when I come to the hospital; don't take any RX" (07/29/2013)  . Chronic kidney disease, unspecified   . Overactive bladder   . Coronary artery disease     s/p CABG  . Type II diabetes mellitus   . Hypertension   . Hyperlipidemia   . Carotid stenosis     occluded right intracranial ICA followed by Dr. Arbie Cookey  . Dysrhythmia   . Paroxysmal atrial fibrillation   . Complete heart block     s/p upgrade to duale  chamber PPM  . Severe mitral regurgitation     s/p MV ring  . Chronic diastolic CHF (congestive heart failure)     Current Outpatient Prescriptions  Medication Sig Dispense Refill  . acetaminophen (TYLENOL) 500 MG tablet Take 500 mg by mouth daily as needed for pain.      Marland Kitchen amLODipine (NORVASC) 5 MG tablet Take 10 mg by mouth daily.       Marland Kitchen aspirin EC 81 MG tablet Take 81 mg by mouth every morning.      . carvedilol (COREG) 12.5 MG tablet TAKE 1/2 TABLET TWICE DAILY  30 tablet  5  . Cholecalciferol (VITAMIN D) 2000 UNITS tablet Take 2,000 Units by mouth daily.       Marland Kitchen ezetimibe-simvastatin (VYTORIN) 10-20 MG per tablet Take 1 tablet by mouth at bedtime.       . fish oil-omega-3 fatty acids 1000 MG capsule Take 1 g by mouth 2 (two) times daily.       . furosemide (LASIX) 40 MG tablet Take 40 mg by mouth daily.       Marland Kitchen glipiZIDE (GLUCOTROL XL) 10 MG 24 hr tablet Take 20 mg by mouth daily.      Marland Kitchen  hydrALAZINE (APRESOLINE) 25 MG tablet Take 25 mg by mouth 2 (two) times daily.       . meclizine (ANTIVERT) 25 MG tablet Take 25 mg by mouth as needed for dizziness.       . Multiple Vitamins-Minerals (CENTRUM SILVER PO) Take 1 tablet by mouth daily.       Marland Kitchen oxybutynin (DITROPAN-XL) 10 MG 24 hr tablet Take 10 mg by mouth daily.       . potassium chloride SA (K-DUR,KLOR-CON) 20 MEQ tablet Take 20 mEq by mouth daily.      . ramipril (ALTACE) 10 MG capsule Take 10 mg by mouth daily.        No current facility-administered medications for this visit.    Allergies:    Allergies  Allergen Reactions  . Fosamax [Alendronate] Anaphylaxis  . Alendronate Sodium Other (See Comments)    Pt does not recall being allergic  . Clonidine Derivatives Other (See Comments)    Pt does not recall allergy  . Darvocet [Propoxyphene-Acetaminophen] Nausea And Vomiting  . Horse-Derived Products   . Sulfa Drugs Cross Reactors Hives and Itching  . Tramadol Nausea And Vomiting  . Vicodin [Hydrocodone-Acetaminophen]  Other (See Comments)    lightheaded  . Penicillins Rash  . Septra [Sulfamethoxazole-Trimethoprim] Rash    Social History:  The patient  reports that she has never smoked. She has never used smokeless tobacco. She reports that she does not drink alcohol or use illicit drugs.   Family History:  The patient's family history includes Cancer in her mother and sister; Coronary artery disease in an other family member; Diabetes in an other family member; Heart disease in her mother and sister; Hypertension in an other family member.   ROS:  Please see the history of present illness.      All other systems reviewed and negative.   PHYSICAL EXAM: VS:  BP 151/84  Pulse 86  Ht 5\' 2"  (1.575 m)  Wt 153 lb (69.4 kg)  BMI 27.98 kg/m2 Well nourished, well developed, in no acute distress HEENT: normal Neck: no JVD Cardiac:  normal S1, S2; RRR; no murmur Lungs:  clear to auscultation bilaterally, no wheezing, rhonchi or rales Abd: soft, nontender, no hepatomegaly Ext: no edema Skin: warm and dry Neuro:  CNs 2-12 intact, no focal abnormalities noted       ASSESSMENT AND PLAN:  1. ASCAD with no angina  - continue ASA 2. HTN  - continue amlodipine/Coreg/Hydralazine/Ramipril 3. Dyslipidemia LDL at goal 57 by lipids 06/2013  - continue Vytorin/fish oil 4. MR s/p MV repair 5. Chronic diastolic CHF  - continue ramipril/Coreg and Lasix 6. CHB s/p PPM 7. Ischemic DCM - resolved  Followup with me in 6 months    Signed, Armanda Magic, MD 11/09/2013 9:12 AM

## 2013-11-10 ENCOUNTER — Encounter: Payer: Self-pay | Admitting: Internal Medicine

## 2013-11-11 ENCOUNTER — Other Ambulatory Visit: Payer: Self-pay | Admitting: Cardiology

## 2014-01-07 ENCOUNTER — Encounter: Payer: Self-pay | Admitting: Internal Medicine

## 2014-01-11 ENCOUNTER — Other Ambulatory Visit: Payer: Self-pay | Admitting: Cardiology

## 2014-01-18 ENCOUNTER — Emergency Department (HOSPITAL_COMMUNITY)
Admission: EM | Admit: 2014-01-18 | Discharge: 2014-01-18 | Disposition: A | Discharge status: Home / Self Care | Payer: Medicare Other | Attending: Emergency Medicine | Admitting: Emergency Medicine

## 2014-01-18 ENCOUNTER — Emergency Department (HOSPITAL_COMMUNITY): Payer: Medicare Other

## 2014-01-18 ENCOUNTER — Encounter (HOSPITAL_COMMUNITY): Payer: Self-pay | Admitting: Emergency Medicine

## 2014-01-18 DIAGNOSIS — Z951 Presence of aortocoronary bypass graft: Secondary | ICD-10-CM | POA: Insufficient documentation

## 2014-01-18 DIAGNOSIS — I059 Rheumatic mitral valve disease, unspecified: Secondary | ICD-10-CM | POA: Insufficient documentation

## 2014-01-18 DIAGNOSIS — M810 Age-related osteoporosis without current pathological fracture: Secondary | ICD-10-CM | POA: Insufficient documentation

## 2014-01-18 DIAGNOSIS — R29898 Other symptoms and signs involving the musculoskeletal system: Secondary | ICD-10-CM | POA: Diagnosis present

## 2014-01-18 DIAGNOSIS — Z882 Allergy status to sulfonamides status: Secondary | ICD-10-CM | POA: Insufficient documentation

## 2014-01-18 DIAGNOSIS — I129 Hypertensive chronic kidney disease with stage 1 through stage 4 chronic kidney disease, or unspecified chronic kidney disease: Secondary | ICD-10-CM | POA: Insufficient documentation

## 2014-01-18 DIAGNOSIS — R0602 Shortness of breath: Secondary | ICD-10-CM | POA: Insufficient documentation

## 2014-01-18 DIAGNOSIS — Z87448 Personal history of other diseases of urinary system: Secondary | ICD-10-CM | POA: Insufficient documentation

## 2014-01-18 DIAGNOSIS — F411 Generalized anxiety disorder: Secondary | ICD-10-CM | POA: Insufficient documentation

## 2014-01-18 DIAGNOSIS — Z9849 Cataract extraction status, unspecified eye: Secondary | ICD-10-CM | POA: Insufficient documentation

## 2014-01-18 DIAGNOSIS — I2510 Atherosclerotic heart disease of native coronary artery without angina pectoris: Secondary | ICD-10-CM | POA: Insufficient documentation

## 2014-01-18 DIAGNOSIS — Z8679 Personal history of other diseases of the circulatory system: Secondary | ICD-10-CM | POA: Insufficient documentation

## 2014-01-18 DIAGNOSIS — M129 Arthropathy, unspecified: Secondary | ICD-10-CM | POA: Insufficient documentation

## 2014-01-18 DIAGNOSIS — Z885 Allergy status to narcotic agent status: Secondary | ICD-10-CM | POA: Insufficient documentation

## 2014-01-18 DIAGNOSIS — E785 Hyperlipidemia, unspecified: Secondary | ICD-10-CM | POA: Insufficient documentation

## 2014-01-18 DIAGNOSIS — I2589 Other forms of chronic ischemic heart disease: Secondary | ICD-10-CM | POA: Insufficient documentation

## 2014-01-18 DIAGNOSIS — N289 Disorder of kidney and ureter, unspecified: Secondary | ICD-10-CM | POA: Insufficient documentation

## 2014-01-18 DIAGNOSIS — I4891 Unspecified atrial fibrillation: Secondary | ICD-10-CM | POA: Insufficient documentation

## 2014-01-18 DIAGNOSIS — Z79899 Other long term (current) drug therapy: Secondary | ICD-10-CM | POA: Insufficient documentation

## 2014-01-18 DIAGNOSIS — R42 Dizziness and giddiness: Secondary | ICD-10-CM | POA: Insufficient documentation

## 2014-01-18 DIAGNOSIS — I679 Cerebrovascular disease, unspecified: Secondary | ICD-10-CM | POA: Insufficient documentation

## 2014-01-18 DIAGNOSIS — Z881 Allergy status to other antibiotic agents status: Secondary | ICD-10-CM | POA: Insufficient documentation

## 2014-01-18 DIAGNOSIS — Z950 Presence of cardiac pacemaker: Secondary | ICD-10-CM | POA: Insufficient documentation

## 2014-01-18 DIAGNOSIS — Z95810 Presence of automatic (implantable) cardiac defibrillator: Secondary | ICD-10-CM | POA: Insufficient documentation

## 2014-01-18 DIAGNOSIS — I5023 Acute on chronic systolic (congestive) heart failure: Secondary | ICD-10-CM | POA: Insufficient documentation

## 2014-01-18 DIAGNOSIS — Z8673 Personal history of transient ischemic attack (TIA), and cerebral infarction without residual deficits: Secondary | ICD-10-CM | POA: Insufficient documentation

## 2014-01-18 DIAGNOSIS — R209 Unspecified disturbances of skin sensation: Secondary | ICD-10-CM | POA: Insufficient documentation

## 2014-01-18 DIAGNOSIS — Z888 Allergy status to other drugs, medicaments and biological substances status: Secondary | ICD-10-CM | POA: Insufficient documentation

## 2014-01-18 DIAGNOSIS — Z880 Allergy status to penicillin: Secondary | ICD-10-CM | POA: Insufficient documentation

## 2014-01-18 DIAGNOSIS — R202 Paresthesia of skin: Secondary | ICD-10-CM | POA: Diagnosis present

## 2014-01-18 DIAGNOSIS — E119 Type 2 diabetes mellitus without complications: Secondary | ICD-10-CM | POA: Insufficient documentation

## 2014-01-18 DIAGNOSIS — Z7982 Long term (current) use of aspirin: Secondary | ICD-10-CM | POA: Insufficient documentation

## 2014-01-18 DIAGNOSIS — I519 Heart disease, unspecified: Secondary | ICD-10-CM | POA: Insufficient documentation

## 2014-01-18 DIAGNOSIS — M6281 Muscle weakness (generalized): Secondary | ICD-10-CM | POA: Insufficient documentation

## 2014-01-18 DIAGNOSIS — N189 Chronic kidney disease, unspecified: Secondary | ICD-10-CM | POA: Insufficient documentation

## 2014-01-18 DIAGNOSIS — I6529 Occlusion and stenosis of unspecified carotid artery: Secondary | ICD-10-CM | POA: Insufficient documentation

## 2014-01-18 LAB — TROPONIN I: Troponin I: <0.3 ng/mL (ref <0.30)

## 2014-01-18 LAB — COMPREHENSIVE METABOLIC PANEL
ALBUMIN: 3.3 g/dL — AB (ref 3.5–5.2)
ALK PHOS: 70 U/L (ref 39–117)
ALT: 14 U/L (ref 0–35)
AST: 21 U/L (ref 0–37)
BUN: 35 mg/dL — AB (ref 6–23)
CHLORIDE: 105 meq/L (ref 96–112)
CO2: 22 meq/L (ref 19–32)
Calcium: 9.5 mg/dL (ref 8.4–10.5)
Creatinine, Ser: 2.3 mg/dL — ABNORMAL HIGH (ref 0.50–1.10)
GFR calc non Af Amer: 19 mL/min — ABNORMAL LOW (ref >90)
GFR, EST AFRICAN AMERICAN: 21 mL/min — AB (ref >90)
GLUCOSE: 81 mg/dL (ref 70–99)
POTASSIUM: 4.5 meq/L (ref 3.7–5.3)
Sodium: 142 mEq/L (ref 137–147)
Total Bilirubin: 0.4 mg/dL (ref 0.3–1.2)
Total Protein: 6.7 g/dL (ref 6.0–8.3)

## 2014-01-18 LAB — URINALYSIS W MICROSCOPIC + REFLEX CULTURE
BILIRUBIN URINE: NEGATIVE
Glucose, UA: NEGATIVE mg/dL
Hgb urine dipstick: NEGATIVE
KETONES UR: NEGATIVE mg/dL
Leukocytes, UA: NEGATIVE
Nitrite: NEGATIVE
PH: 5.5 (ref 5.0–8.0)
Protein, ur: NEGATIVE mg/dL
Specific Gravity, Urine: 1.008 (ref 1.005–1.030)
Urobilinogen, UA: 0.2 mg/dL (ref 0.0–1.0)

## 2014-01-18 LAB — CBC WITH DIFFERENTIAL/PLATELET
Basophils Absolute: 0 10*3/uL (ref 0.0–0.1)
Basophils Relative: 0 % (ref 0–1)
Eosinophils Absolute: 0.1 10*3/uL (ref 0.0–0.7)
Eosinophils Relative: 1 % (ref 0–5)
HCT: 33.9 % — ABNORMAL LOW (ref 36.0–46.0)
Hemoglobin: 11.5 g/dL — ABNORMAL LOW (ref 12.0–15.0)
LYMPHS ABS: 1.1 10*3/uL (ref 0.7–4.0)
LYMPHS PCT: 15 % (ref 12–46)
MCH: 32.2 pg (ref 26.0–34.0)
MCHC: 33.9 g/dL (ref 30.0–36.0)
MCV: 95 fL (ref 78.0–100.0)
MONO ABS: 0.7 10*3/uL (ref 0.1–1.0)
MONOS PCT: 10 % (ref 3–12)
NEUTROS ABS: 5.2 10*3/uL (ref 1.7–7.7)
Neutrophils Relative %: 73 % (ref 43–77)
Platelets: 173 10*3/uL (ref 150–400)
RBC: 3.57 MIL/uL — ABNORMAL LOW (ref 3.87–5.11)
RDW: 13.2 % (ref 11.5–15.5)
WBC: 7.1 10*3/uL (ref 4.0–10.5)

## 2014-01-18 MED ORDER — SODIUM CHLORIDE 0.9 % IV BOLUS (SEPSIS)
500.0000 mL | INTRAVENOUS | Status: AC
  Administered 2014-01-18: 500 mL via INTRAVENOUS

## 2014-01-18 MED ORDER — ACETAMINOPHEN 325 MG PO TABS
650.0000 mg | ORAL_TABLET | Freq: Once | ORAL | Status: AC
  Administered 2014-01-18: 650 mg via ORAL
  Filled 2014-01-18: qty 2

## 2014-01-18 NOTE — ED Provider Notes (Signed)
CSN: [id number]     Arrival date & time 01/18/14  [id number] History   First MD Initiated Contact with Patient 01/18/14 0735     Chief Complaint  Patient presents with  . Extremity Weakness   (Consider location/radiation/quality/duration/timing/severity/associated sxs/prior Treatment) Patient is a 80 y.o. female presenting with extremity weakness. The history is provided by the patient.  Extremity Weakness This is a recurrent problem. The current episode started 1 to 2 hours ago. The problem occurs constantly. The problem has been gradually improving. Pertinent negatives include no chest pain, no abdominal pain, no headaches and no shortness of breath. The symptoms are aggravated by walking. Nothing relieves the symptoms. She has tried nothing for the symptoms. The treatment provided mild relief.    Past Medical History  Diagnosis Date  . Ischemic cardiomyopathy     s/p ICD (MDT) by Dr Leonia Reeves 2006, EF 24% at the time but now 55%  . Mitral regurgitation   . Dyslipidemia   . Cerebrovascular disease     extracranial  . Osteoporosis   . Chronic systolic dysfunction of left ventricle   . Fractured shoulder 2009    Right shoulder Fx. from a fall  . Acute on chronic systolic heart failure     secondary to complete heart block  . Automatic implantable cardioverter-defibrillator in situ     placed in 2006; removed on 07/29/2013 (07/29/2013)  . Pacemaker 07/29/2013    Medtronic Adapta L DR implanted with new RA and RV leads inserted  . Exertional shortness of breath   . Stroke 07/2005    "when they took me off heart/lung machine; affected my left foot" (07/29/2013)  . Arthritis     "left wrist; back" (07/29/2013)  . Anxiety     "when I come to the hospital; don't take any RX" (07/29/2013)  . Chronic kidney disease, unspecified   . Overactive bladder   . Coronary artery disease     s/p CABG  . Type II diabetes mellitus   . Hypertension   . Hyperlipidemia   . Carotid stenosis     occluded right  intracranial ICA followed by Dr. Donnetta Hutching  . Dysrhythmia   . Paroxysmal atrial fibrillation   . Complete heart block     s/p upgrade to duale chamber PPM  . Severe mitral regurgitation     s/p MV ring  . Chronic diastolic CHF (congestive heart failure)    Past Surgical History  Procedure Laterality Date  . Cardiac defibrillator placement  11/2005    by Dr Leonia Reeves (MDT) she has a [phone] fidelis lead  . Cataract extraction w/ intraocular lens  implant, bilateral Bilateral 1980's  . Mitral valve replacement  07/2005  . Coronary artery bypass graft  07/2005    "CABG X3" (07/29/2013)  . Salpingoophorectomy Bilateral 1976  . Total hip arthroplasty Right 2001  . Cardiac catheterization  07/2005  . Cardiac valve replacement  07/2005    "mitral valve" (07/29/2013)  . Appendectomy  1949  . Abdominal hysterectomy  ? 1974  . Shoulder arthroscopy w/ rotator cuff repair Right 1998  . Pacemaker placement  07/29/2013    upgrade of single chamber ICD to Medtronic Adapta L DR implanted with new RA and RV leads inserted   Family History  Problem Relation Age of Onset  . Coronary artery disease      family hx of  . Hypertension      family hx of  . Diabetes      family hx of  .  Cancer Mother     Theadora Rama   . Heart disease Mother   . Heart disease Sister   . Cancer Sister    History  Substance Use Topics  . Smoking status: Never Smoker   . Smokeless tobacco: Never Used  . Alcohol Use: No   OB History   Grav Para Term Preterm Abortions TAB SAB Ect Mult Living                 Review of Systems  Constitutional: Negative for fever and fatigue.  HENT: Negative for congestion and drooling.   Eyes: Negative for pain.  Respiratory: Negative for cough and shortness of breath.   Cardiovascular: Negative for chest pain.  Gastrointestinal: Negative for nausea, vomiting, abdominal pain and diarrhea.  Genitourinary: Negative for dysuria and hematuria.  Musculoskeletal: Positive for extremity weakness.  Negative for back pain, gait problem and neck pain.  Skin: Negative for color change.  Neurological: Positive for dizziness (mild w/ standing), weakness (in lower extremities) and numbness (in bilateral hands). Negative for headaches.  Hematological: Negative for adenopathy.  Psychiatric/Behavioral: Negative for behavioral problems.  All other systems reviewed and are negative.    Allergies  Fosamax; Alendronate sodium; Clonidine derivatives; Darvocet; Horse-derived products; Sulfa drugs cross reactors; Tramadol; Vicodin; Penicillins; and Septra  Home Medications   Current Outpatient Rx  Name  Route  Sig  Dispense  Refill  . acetaminophen (TYLENOL) 500 MG tablet   Oral   Take 500 mg by mouth daily as needed for pain.         Marland Kitchen amLODipine (NORVASC) 5 MG tablet   Oral   Take 10 mg by mouth daily.          Marland Kitchen aspirin EC 81 MG tablet   Oral   Take 81 mg by mouth every morning.         . carvedilol (COREG) 12.5 MG tablet      TAKE 1/2 TABLET TWICE DAILY   30 tablet   5   . Cholecalciferol (VITAMIN D) 2000 UNITS tablet   Oral   Take 2,000 Units by mouth daily.          Marland Kitchen ezetimibe-simvastatin (VYTORIN) 10-20 MG per tablet   Oral   Take 1 tablet by mouth at bedtime.          . fish oil-omega-3 fatty acids 1000 MG capsule   Oral   Take 1 g by mouth 2 (two) times daily.          . furosemide (LASIX) 40 MG tablet   Oral   Take 40 mg by mouth daily.          Marland Kitchen glipiZIDE (GLUCOTROL XL) 10 MG 24 hr tablet   Oral   Take 20 mg by mouth daily.         . hydrALAZINE (APRESOLINE) 25 MG tablet      1 TABLET TWICE A DAY ORALLY   60 tablet   3   . meclizine (ANTIVERT) 25 MG tablet   Oral   Take 25 mg by mouth as needed for dizziness.          . Multiple Vitamins-Minerals (CENTRUM SILVER PO)   Oral   Take 1 tablet by mouth daily.          Marland Kitchen oxybutynin (DITROPAN-XL) 10 MG 24 hr tablet   Oral   Take 10 mg by mouth daily.          . potassium  chloride SA (  K-DUR,KLOR-CON) 20 MEQ tablet   Oral   Take 20 mEq by mouth daily.         . ramipril (ALTACE) 10 MG capsule      TAKE 2 CAPSULES BY MOUTH ONCE DAILY   60 capsule   11    BP 97/39  Pulse 81  Temp(Src) 98.7 F (37.1 C) (Oral)  Resp 16  Ht 5\' 2"  (1.575 m)  Wt 150 lb (68.04 kg)  BMI 27.43 kg/m2  SpO2 100% Physical Exam  Nursing note and vitals reviewed. Constitutional: She is oriented to person, place, and time. She appears well-developed and well-nourished.  HENT:  Head: Normocephalic.  Mouth/Throat: Oropharynx is clear and moist. No oropharyngeal exudate.  Eyes: Conjunctivae and EOM are normal. Pupils are equal, round, and reactive to light.  Neck: Normal range of motion. Neck supple.  Cardiovascular: Normal rate, regular rhythm, normal heart sounds and intact distal pulses.  Exam reveals no gallop and no friction rub.   No murmur heard. Pulmonary/Chest: Effort normal and breath sounds normal. No respiratory distress. She has no wheezes.  Abdominal: Soft. Bowel sounds are normal. There is no tenderness. There is no rebound and no guarding.  Musculoskeletal: Normal range of motion. She exhibits no edema and no tenderness.  Neurological: She is alert and oriented to person, place, and time. She has normal strength. No cranial nerve deficit or sensory deficit. She displays a negative Romberg sign. Coordination normal.  Normal speech and understanding.  Normal finger to nose bilaterally.  The patient is ambulatory with minimal to no assistance. She did not feel confident when standing and continues to feel weakness in her lower extremities. She was able to ambulate a short distance around the room. She does not have an ataxic gait.  Skin: Skin is warm and dry.  Psychiatric: She has a normal mood and affect. Her behavior is normal.    ED Course  Procedures (including critical care time) Labs Review Labs Reviewed  CBC WITH DIFFERENTIAL - Abnormal; Notable for  the following:    RBC 3.57 (*)    Hemoglobin 11.5 (*)    HCT 33.9 (*)    All other components within normal limits  COMPREHENSIVE METABOLIC PANEL - Abnormal; Notable for the following:    BUN 35 (*)    Creatinine, Ser 2.30 (*)    Albumin 3.3 (*)    GFR calc non Af Amer 19 (*)    GFR calc Af Amer 21 (*)    All other components within normal limits  TROPONIN I  URINALYSIS W MICROSCOPIC + REFLEX CULTURE   Imaging Review Dg Chest 2 View  01/18/2014   CLINICAL DATA:  Cough and shortness of breath  EXAM: CHEST  2 VIEW  COMPARISON:  July 30, 2013  FINDINGS: There is no edema or consolidation. Heart size and pulmonary vascularity are normal. Patient is status post mitral valve replacement and coronary artery bypass grafting. Pacemaker leads are attached to the right atrium and right ventricle. No adenopathy. There is degenerative change in the thoracic spine. There is atherosclerotic change in the aorta.  IMPRESSION: No edema or consolidation.   Electronically Signed   By: Lowella Grip M.D.   On: 01/18/2014 09:01   Ct Head Wo Contrast  01/18/2014   CLINICAL DATA:  Bilateral leg weakness  EXAM: CT HEAD WITHOUT CONTRAST  TECHNIQUE: Contiguous axial images were obtained from the base of the skull through the vertex without intravenous contrast.  COMPARISON:  CT head 07/17/2013  FINDINGS: Moderate atrophy. Mild chronic microvascular ischemic change in the white matter. Small chronic infarct left cerebellum unchanged.  Negative for acute infarct.  Negative for hemorrhage or mass.  Atherosclerotic disease.  No focal bony abnormality.  IMPRESSION: Atrophy and chronic microvascular ischemia.  No acute abnormality.   Electronically Signed   By: Franchot Gallo M.D.   On: 01/18/2014 08:46    EKG Interpretation    Date/Time:  Monday January 18 2014 07:35:19 EST Ventricular Rate:  89 PR Interval:    QRS Duration: 170 QT Interval:  483 QTC Calculation: 588 R Axis:   -94 Text Interpretation:  Atrial  sensed ventricular paced rhythm Nonspecific IVCD with LAD  Anterolateral infarct, old No significant change since last tracing Confirmed by Karsten Vaughn  MD, Brileigh Sevcik ([id number]) on 01/18/2014 7:44:26 AM            MDM   1. Lower extremity weakness   2. Renal insufficiency   3. Paresthesia of both hands    8:14 AM 80 y.o. female who presents with multiple complaints. The patient states that she woke up with bilateral hand numbness and tingling and felt unsteady on her feet. She has had similar symptoms previously and notes that she's had intermittent hand numbness and tingling since July of 2014. She also has a history of dizziness and notes that she is intermittently unsteady on her feet. She felt like it was worse this morning when she woke up at 6 AM. She denies any chest pain, shortness of breath, fevers, vomiting, or diarrhea. She notes that she had a cold last week with productive cough and congestion but is now feeling better. She is afebrile and vital signs are unremarkable here. Will get screening labwork and imaging.  11:27 AM: Pt found to have mild renal insuff. She states she is urinating normally. She got 500 cc IVF here. Labs imaging otherwise non-contrib. I do not think this is a CVA/TIA given the chronicity of the sx w/ mild worsening. I offered admission, but the pt/family would prefer to go home. I have ambulated her multiple times during the visit and the family states she is at baseline w/ her ambulation. I notified the family that the pt will need a repeat Cr in 1-2 days and should f/u w/ her pcp at that time. I also arranged home health to eval the pt tomorrow and had the care manager speak w/ the family. I think she would benefit from this as she lives alone and there are some mild ambulatory issues.  I have discussed the diagnosis/risks/treatment options with the patient and family and believe the pt to be eligible for discharge home to follow-up with pcp in 1-2 days. We also discussed  returning to the ED immediately if new or worsening sx occur. We discussed the sx which are most concerning (e.g., worsening dizziness, falls, dec urination, fatigue, fever) that necessitate immediate return. Any new prescriptions provided to the patient are listed below.    Blanchard Kelch, MD 01/19/14 1101

## 2014-01-18 NOTE — ED Notes (Signed)
MD Harrison at bedside. 

## 2014-01-18 NOTE — ED Notes (Signed)
Patient transported to X-ray 

## 2014-01-18 NOTE — ED Notes (Signed)
Pt presents from home via [facility] with weakness in her bilateral legs x 3 days and numbness in her bilateral hands with pain that radiates above her elbow that has worsened.  No N/V/D.  Pt is ambulatory at home no assistive devices.  CBG 104, sitting BP 108/60, standing BP 98.54, 16 respirations, 74 pulse.  Pt has a pacemaker.

## 2014-01-18 NOTE — ED Notes (Signed)
Family at bedside. 

## 2014-01-18 NOTE — ED Notes (Signed)
MD at bedside. 

## 2014-01-18 NOTE — Progress Notes (Signed)
   CARE MANAGEMENT NOTE 01/18/2014  Patient:  ASHAYLA, SUBIA   Account Number:  [id number]  Date Initiated:  01/18/2014  Documentation initiated by:  [facility]  Subjective/Objective Assessment:   weakness     Action/Plan:   lives alone   Anticipated DC Date:  01/18/2014   Anticipated DC Plan:  [place]  CM consult      [facility] Choice  HOME HEALTH   Choice offered to / List presented to:  C-1 Patient        [place] arranged  [place].   Status of service:  Completed, signed off Medicare Important Message given?   (If response is "NO", the following Medicare IM given date fields will be blank) Date Medicare IM given:   Date Additional Medicare IM given:    Discharge Disposition:  [place]  Per UR Regulation:    If discussed at Long Length of Stay Meetings, dates discussed:    Comments:  01/18/2014 1120 NCM spoke to pt and states she  had [facility] in the past for [facility]. Requested [facility] for [facility]. States she has RW at home and no DME is needed for home. Notified [facility] for [facility] for scheduled dc today. Verified address and phone number. Jonnie Finner RN Case Mgmt phone [phone]

## 2014-01-24 ENCOUNTER — Other Ambulatory Visit: Payer: Self-pay | Admitting: Cardiology

## 2014-01-25 ENCOUNTER — Other Ambulatory Visit: Payer: Self-pay | Admitting: Cardiology

## 2014-01-25 ENCOUNTER — Telehealth: Payer: Self-pay

## 2014-01-25 NOTE — Telephone Encounter (Signed)
error 

## 2014-02-01 ENCOUNTER — Telehealth: Payer: Self-pay | Admitting: Internal Medicine

## 2014-02-01 ENCOUNTER — Encounter: Payer: Medicare Other | Admitting: *Deleted

## 2014-02-01 NOTE — Telephone Encounter (Signed)
New Problem:  Pt is calling to get help with setting up her device equipment.

## 2014-02-01 NOTE — Telephone Encounter (Signed)
N/A /kwm  

## 2014-02-17 ENCOUNTER — Encounter: Payer: Self-pay | Admitting: *Deleted

## 2014-02-17 NOTE — Telephone Encounter (Signed)
Spoke w/pt in regards to remote and pt to resend transmission.

## 2014-02-18 ENCOUNTER — Ambulatory Visit (INDEPENDENT_AMBULATORY_CARE_PROVIDER_SITE_OTHER): Payer: Medicare Other

## 2014-02-18 DIAGNOSIS — I4891 Unspecified atrial fibrillation: Secondary | ICD-10-CM

## 2014-02-20 LAB — MDC_IDC_ENUM_SESS_TYPE_REMOTE
Battery Impedance: 100 Ohm
Battery Remaining Longevity: 133 mo
Brady Statistic AP VP Percent: 40 %
Brady Statistic AP VS Percent: 50 %
Brady Statistic AS VP Percent: 2 %
Date Time Interrogation Session: 20150219143356
Lead Channel Impedance Value: 429 Ohm
Lead Channel Pacing Threshold Amplitude: 0.5 V
Lead Channel Sensing Intrinsic Amplitude: 1.2 mV
Lead Channel Setting Pacing Amplitude: 2 V
Lead Channel Setting Pacing Amplitude: 2.5 V
Lead Channel Setting Sensing Sensitivity: 5.6 mV
MDC IDC MSMT BATTERY VOLTAGE: 2.79 V
MDC IDC MSMT LEADCHNL RA PACING THRESHOLD PULSEWIDTH: 0.4 ms
MDC IDC MSMT LEADCHNL RV IMPEDANCE VALUE: 475 Ohm
MDC IDC MSMT LEADCHNL RV PACING THRESHOLD AMPLITUDE: 0.5 V
MDC IDC MSMT LEADCHNL RV PACING THRESHOLD PULSEWIDTH: 0.4 ms
MDC IDC SET LEADCHNL RV PACING PULSEWIDTH: 0.4 ms
MDC IDC STAT BRADY AS VS PERCENT: 8 %

## 2014-03-01 ENCOUNTER — Encounter: Payer: Self-pay | Admitting: *Deleted

## 2014-03-04 ENCOUNTER — Encounter: Payer: Self-pay | Admitting: Internal Medicine

## 2014-04-12 ENCOUNTER — Encounter: Payer: Self-pay | Admitting: Cardiology

## 2014-04-15 ENCOUNTER — Other Ambulatory Visit: Payer: Self-pay

## 2014-04-15 MED ORDER — CARVEDILOL 12.5 MG PO TABS: ORAL_TABLET | ORAL | Status: DC

## 2014-05-10 ENCOUNTER — Encounter: Payer: Self-pay | Admitting: Cardiology

## 2014-05-10 ENCOUNTER — Encounter: Payer: Self-pay | Admitting: General Surgery

## 2014-05-10 ENCOUNTER — Ambulatory Visit (INDEPENDENT_AMBULATORY_CARE_PROVIDER_SITE_OTHER): Payer: Medicare Other | Admitting: Cardiology

## 2014-05-10 VITALS — BP 142/62 | HR 70 | Ht 62.0 in | Wt 151.0 lb

## 2014-05-10 DIAGNOSIS — I5032 Chronic diastolic (congestive) heart failure: Secondary | ICD-10-CM

## 2014-05-10 DIAGNOSIS — E785 Hyperlipidemia, unspecified: Secondary | ICD-10-CM

## 2014-05-10 DIAGNOSIS — I4891 Unspecified atrial fibrillation: Secondary | ICD-10-CM

## 2014-05-10 DIAGNOSIS — I10 Essential (primary) hypertension: Secondary | ICD-10-CM

## 2014-05-10 DIAGNOSIS — I2510 Atherosclerotic heart disease of native coronary artery without angina pectoris: Secondary | ICD-10-CM

## 2014-05-10 DIAGNOSIS — I509 Heart failure, unspecified: Secondary | ICD-10-CM

## 2014-05-10 DIAGNOSIS — I442 Atrioventricular block, complete: Secondary | ICD-10-CM

## 2014-05-10 DIAGNOSIS — I340 Nonrheumatic mitral (valve) insufficiency: Secondary | ICD-10-CM

## 2014-05-10 DIAGNOSIS — I059 Rheumatic mitral valve disease, unspecified: Secondary | ICD-10-CM

## 2014-05-10 LAB — BASIC METABOLIC PANEL
BUN: 20 mg/dL (ref 6–23)
CALCIUM: 10.3 mg/dL (ref 8.4–10.5)
CO2: 25 mEq/L (ref 19–32)
CREATININE: 1.2 mg/dL (ref 0.4–1.2)
Chloride: 107 mEq/L (ref 96–112)
GFR: 45.5 mL/min — ABNORMAL LOW (ref >60.00)
Glucose, Bld: 109 mg/dL — ABNORMAL HIGH (ref 70–99)
Potassium: 4.8 mEq/L (ref 3.5–5.1)
Sodium: 140 mEq/L (ref 135–145)

## 2014-05-10 LAB — LIPID PANEL
CHOLESTEROL: 154 mg/dL (ref 0–200)
HDL: 57.3 mg/dL (ref >39.00)
LDL Cholesterol: 76 mg/dL (ref 0–99)
TRIGLYCERIDES: 105 mg/dL (ref 0.0–149.0)
Total CHOL/HDL Ratio: 3
VLDL: 21 mg/dL (ref 0.0–40.0)

## 2014-05-10 LAB — HEPATIC FUNCTION PANEL
ALBUMIN: 4.1 g/dL (ref 3.5–5.2)
ALK PHOS: 59 U/L (ref 39–117)
ALT: 15 U/L (ref 0–35)
AST: 20 U/L (ref 0–37)
Bilirubin, Direct: 0.1 mg/dL (ref 0.0–0.3)
TOTAL PROTEIN: 7.2 g/dL (ref 6.0–8.3)
Total Bilirubin: 0.8 mg/dL (ref 0.2–1.2)

## 2014-05-10 NOTE — Progress Notes (Signed)
[place], [place], [place]  [address] Phone: [phone] Fax:  [phone]  Date:  05/10/2014   ID:  [name], DOB 1928-04-28, MRN [id number]  PCP:  Mayra Neer, MD  Cardiologist:  Fransico Him, MD   History of Present Illness:  [name] is a 80 y.o. female who presents today for followup of her ischemic DCM, chronic diastolic CHF, ASCAD, dyslipidemia, MR s/p MV repair, and HTN. She is doing well. She denies any chest pain, LE edema,  palpitations or syncope. She occasionally has some DOE if she is rushing but otherwise no problems with SOB.  She occasionally will fell dizzy when going from sitting to standing or standing for a while.   Wt Readings from Last 3 Encounters:  05/10/14 151 lb (68.493 kg)  01/18/14 150 lb (68.04 kg)  11/09/13 153 lb (69.4 kg)     Past Medical History  Diagnosis Date  . Ischemic cardiomyopathy     s/p ICD (MDT) by Dr Leonia Reeves 2006, EF 24% at the time but now 55%  . Mitral regurgitation   . Dyslipidemia   . Cerebrovascular disease     extracranial  . Osteoporosis   . Chronic systolic dysfunction of left ventricle   . Fractured shoulder 2009    Right shoulder Fx. from a fall  . Acute on chronic systolic heart failure     secondary to complete heart block  . Automatic implantable cardioverter-defibrillator in situ     placed in 2006; removed on 07/29/2013 (07/29/2013)  . Pacemaker 07/29/2013    Medtronic Adapta L DR implanted with new RA and RV leads inserted  . Exertional shortness of breath   . Stroke 07/2005    "when they took me off heart/lung machine; affected my left foot" (07/29/2013)  . Arthritis     "left wrist; back" (07/29/2013)  . Anxiety     "when I come to the hospital; don't take any RX" (07/29/2013)  . Chronic kidney disease, unspecified   . Overactive bladder   . Coronary artery disease     s/p CABG  . Type II diabetes mellitus   . Hypertension   . Hyperlipidemia   . Carotid stenosis     occluded  right intracranial ICA followed by Dr. Donnetta Hutching  . Dysrhythmia   . Paroxysmal atrial fibrillation   . Complete heart block     s/p upgrade to duale chamber PPM  . Severe mitral regurgitation     s/p MV ring  . Chronic diastolic CHF (congestive heart failure)     Current Outpatient Prescriptions  Medication Sig Dispense Refill  . acetaminophen (TYLENOL) 500 MG tablet Take 500 mg by mouth daily as needed for pain.      Marland Kitchen amLODipine (NORVASC) 10 MG tablet 1 TABLET ONCE A DAY ORALLY 30 DAYS  30 tablet  6  . aspirin EC 81 MG tablet Take 81 mg by mouth every morning.      . carvedilol (COREG) 12.5 MG tablet TAKE 1/2 TABLET TWICE DAILY  30 tablet  5  . Cholecalciferol (VITAMIN D) 2000 UNITS tablet Take 2,000 Units by mouth daily.       Marland Kitchen ezetimibe-simvastatin (VYTORIN) 10-20 MG per tablet Take 1 tablet by mouth at bedtime.       . fish oil-omega-3 fatty acids 1000 MG capsule Take 1 g by mouth 2 (two) times daily.       . furosemide (LASIX) 40 MG tablet Take 40 mg by mouth  daily.       . glipiZIDE (GLUCOTROL XL) 10 MG 24 hr tablet Take 20 mg by mouth daily.      . hydrALAZINE (APRESOLINE) 25 MG tablet 1 TABLET TWICE A DAY ORALLY  60 tablet  3  . meclizine (ANTIVERT) 25 MG tablet Take 25 mg by mouth as needed for dizziness.       . Multiple Vitamins-Minerals (CENTRUM SILVER PO) Take 1 tablet by mouth daily.       Marland Kitchen oxybutynin (DITROPAN-XL) 10 MG 24 hr tablet Take 10 mg by mouth daily.       . potassium chloride SA (K-DUR,KLOR-CON) 20 MEQ tablet Take 20 mEq by mouth daily.      . ramipril (ALTACE) 10 MG capsule TAKE 2 CAPSULES BY MOUTH ONCE DAILY  60 capsule  11   No current facility-administered medications for this visit.    Allergies:    Allergies  Allergen Reactions  . Fosamax [Alendronate] Anaphylaxis  . Alendronate Sodium Other (See Comments)    Pt does not recall being allergic  . Clonidine Derivatives Other (See Comments)    Pt does not recall allergy  . Darvocet [Propoxyphene  N-Acetaminophen] Nausea And Vomiting  . Horse-Derived Products   . Sulfa Drugs Cross Reactors Hives and Itching  . Tramadol Nausea And Vomiting  . Vicodin [Hydrocodone-Acetaminophen] Other (See Comments)    lightheaded  . Penicillins Rash  . Septra [Sulfamethoxazole-Trimethoprim] Rash    Social History:  The patient  reports that she has never smoked. She has never used smokeless tobacco. She reports that she does not drink alcohol or use illicit drugs.   Family History:  The patient's family history includes Cancer in her mother and sister; Coronary artery disease in an other family member; Diabetes in an other family member; Heart disease in her mother and sister; Hypertension in an other family member.   ROS:  Please see the history of present illness.      All other systems reviewed and negative.   PHYSICAL EXAM: VS:  BP 142/62  Pulse 70  Ht 5\' 2"  (1.575 m)  Wt 151 lb (68.493 kg)  BMI 27.61 kg/m2 Well nourished, well developed, in no acute distress HEENT: normal Neck: no JVD Cardiac:  normal S1, S2; RRR; no murmur Lungs:  clear to auscultation bilaterally, no wheezing, rhonchi or rales Abd: soft, nontender, no hepatomegaly Ext: no edema Skin: warm and dry Neuro:  CNs 2-12 intact, no focal abnormalities noted      ASSESSMENT AND PLAN:  1. ASCAD s/p CABG with no angina - continue ASA  2. HTN - continue amlodipine/Coreg/Hydralazine/Ramipril  3. Dyslipidemia LDL at goal 57 by lipids 06/2013 - continue Vytorin/fish oil  - recheck fasting lipids and ALT 4. MR s/p MV repair 5. Chronic diastolic CHF - well compensated - continue ramipril/Coreg and Lasix  - check BMET 6. CHB s/p PPM 7. Ischemic DCM - resolved EF 55%  Followup with me in 6 months    Signed, Fransico Him, MD 05/10/2014 8:52 AM

## 2014-05-10 NOTE — Patient Instructions (Addendum)
Your physician recommends that you continue on your current medications as directed. Please refer to the Current Medication list given to you today.  Your physician recommends that you go to the lab today for a BMET, LIPID, and Hepatic Panel  Your physician wants you to follow-up in: 6 months with Dr Mallie Snooks will receive a reminder letter in the mail two months in advance. If you don't receive a letter, please call our office to schedule the follow-up appointment.

## 2014-05-17 ENCOUNTER — Telehealth: Payer: Self-pay | Admitting: Internal Medicine

## 2014-05-17 NOTE — Telephone Encounter (Signed)
Walk In pt Form " Cardiovascular" form Dropped Off gave to [employer]

## 2014-05-26 ENCOUNTER — Telehealth: Payer: Self-pay | Admitting: Cardiology

## 2014-05-26 ENCOUNTER — Encounter: Payer: Self-pay | Admitting: Internal Medicine

## 2014-05-26 ENCOUNTER — Ambulatory Visit (INDEPENDENT_AMBULATORY_CARE_PROVIDER_SITE_OTHER): Payer: Medicare Other | Admitting: *Deleted

## 2014-05-26 DIAGNOSIS — I4891 Unspecified atrial fibrillation: Secondary | ICD-10-CM

## 2014-05-26 LAB — MDC_IDC_ENUM_SESS_TYPE_REMOTE
Battery Remaining Longevity: 139 mo
Battery Voltage: 2.78 V
Brady Statistic AP VP Percent: 22 %
Brady Statistic AP VS Percent: 67 %
Brady Statistic AS VP Percent: 1 %
Date Time Interrogation Session: 20150527124840
Lead Channel Pacing Threshold Amplitude: 0.5 V
Lead Channel Pacing Threshold Amplitude: 0.625 V
Lead Channel Pacing Threshold Pulse Width: 0.4 ms
Lead Channel Sensing Intrinsic Amplitude: 22.4 mV
Lead Channel Setting Pacing Amplitude: 2 V
Lead Channel Setting Pacing Pulse Width: 0.4 ms
MDC IDC MSMT BATTERY IMPEDANCE: 100 Ohm
MDC IDC MSMT LEADCHNL RA IMPEDANCE VALUE: 447 Ohm
MDC IDC MSMT LEADCHNL RA PACING THRESHOLD PULSEWIDTH: 0.4 ms
MDC IDC MSMT LEADCHNL RV IMPEDANCE VALUE: 476 Ohm
MDC IDC SET LEADCHNL RV PACING AMPLITUDE: 2.5 V
MDC IDC SET LEADCHNL RV SENSING SENSITIVITY: 4 mV
MDC IDC STAT BRADY AS VS PERCENT: 10 %

## 2014-05-26 NOTE — Telephone Encounter (Signed)
New message ° ° ° ° ° ° ° ° ° °Pt would like to know if transmission was received °

## 2014-05-26 NOTE — Telephone Encounter (Signed)
Informed pt that we did receive transmission.

## 2014-05-26 NOTE — Progress Notes (Signed)
Remote pacemaker transmission.   

## 2014-05-28 ENCOUNTER — Telehealth: Payer: Self-pay | Admitting: Internal Medicine

## 2014-05-28 NOTE — Telephone Encounter (Signed)
Cardiovascular form Completed & Pt aware ready For Pick up

## 2014-06-11 ENCOUNTER — Encounter: Payer: Self-pay | Admitting: Cardiology

## 2014-07-07 ENCOUNTER — Encounter: Payer: Self-pay | Admitting: Internal Medicine

## 2014-07-22 ENCOUNTER — Other Ambulatory Visit: Payer: Self-pay

## 2014-07-22 MED ORDER — POTASSIUM CHLORIDE CRYS ER 20 MEQ PO TBCR: 20.0000 meq | EXTENDED_RELEASE_TABLET | Freq: Every day | ORAL | Status: DC

## 2014-08-06 ENCOUNTER — Other Ambulatory Visit: Payer: Self-pay

## 2014-08-13 ENCOUNTER — Other Ambulatory Visit: Payer: Self-pay

## 2014-08-13 MED ORDER — AMLODIPINE BESYLATE 10 MG PO TABS: ORAL_TABLET | ORAL | Status: DC

## 2014-08-31 DIAGNOSIS — C801 Malignant (primary) neoplasm, unspecified: Secondary | ICD-10-CM

## 2014-08-31 HISTORY — DX: Malignant (primary) neoplasm, unspecified: C80.1

## 2014-09-01 ENCOUNTER — Ambulatory Visit (INDEPENDENT_AMBULATORY_CARE_PROVIDER_SITE_OTHER): Payer: Medicare Other | Admitting: Internal Medicine

## 2014-09-01 ENCOUNTER — Encounter: Payer: Self-pay | Admitting: Internal Medicine

## 2014-09-01 VITALS — BP 126/68 | HR 71 | Ht 62.0 in | Wt 152.5 lb

## 2014-09-01 DIAGNOSIS — I10 Essential (primary) hypertension: Secondary | ICD-10-CM

## 2014-09-01 DIAGNOSIS — I4891 Unspecified atrial fibrillation: Secondary | ICD-10-CM

## 2014-09-01 DIAGNOSIS — I442 Atrioventricular block, complete: Secondary | ICD-10-CM

## 2014-09-01 DIAGNOSIS — I480 Paroxysmal atrial fibrillation: Secondary | ICD-10-CM

## 2014-09-01 LAB — MDC_IDC_ENUM_SESS_TYPE_INCLINIC
Brady Statistic AP VS Percent: 73.4 %
Brady Statistic AS VP Percent: 0.9 %
Brady Statistic AS VS Percent: 10.8 %
Lead Channel Impedance Value: 441 Ohm
Lead Channel Impedance Value: 443 Ohm
Lead Channel Pacing Threshold Amplitude: 0.5 V
Lead Channel Pacing Threshold Pulse Width: 0.4 ms
Lead Channel Setting Pacing Amplitude: 2 V
Lead Channel Setting Pacing Amplitude: 2.5 V
MDC IDC MSMT LEADCHNL RA SENSING INTR AMPL: 2.8 mV
MDC IDC MSMT LEADCHNL RV PACING THRESHOLD AMPLITUDE: 0.5 V
MDC IDC MSMT LEADCHNL RV PACING THRESHOLD PULSEWIDTH: 0.4 ms
MDC IDC MSMT LEADCHNL RV SENSING INTR AMPL: 15.68 mV
MDC IDC SET LEADCHNL RV PACING PULSEWIDTH: 0.4 ms
MDC IDC SET LEADCHNL RV SENSING SENSITIVITY: 2.8 mV
MDC IDC STAT BRADY AP VP PERCENT: 14.9 %

## 2014-09-01 NOTE — Patient Instructions (Signed)
Remote monitoring is used to monitor your Pacemaker of ICD from home. This monitoring reduces the number of office visits required to check your device to one time per year. It allows [place] to keep an eye on the functioning of your device to ensure it is working properly. You are scheduled for a device check from home on 12/02/14. You may send your transmission at any time that day. If you have a wireless device, the transmission will be sent automatically. After your physician reviews your transmission, you will receive a postcard with your next transmission date.    Your physician wants you to follow-up in: 12 months with Dr [name] will receive a reminder letter in the mail two months in advance. If you don't receive a letter, please call our office to schedule the follow-up appointment.

## 2014-09-01 NOTE — Progress Notes (Signed)
PCP: Mayra Neer, MD Primary Cardiologist:  Dr Freda Jackson is a 80 y.o. female who presents today for routine electrophysiology followup.  Since her upgrade from single chamber ICD to dual chamber PPM, the patient reports doing very well.  Today, she denies symptoms of palpitations, chest pain, shortness of breath,  lower extremity edema, dizziness, presyncope, or syncope.  The patient is otherwise without complaint today.   Past Medical History  Diagnosis Date  . Ischemic cardiomyopathy     s/p ICD (MDT) by Dr Leonia Reeves 2006, EF 24% at the time but now 55%  . Mitral regurgitation   . Dyslipidemia   . Cerebrovascular disease     extracranial  . Osteoporosis   . Chronic systolic dysfunction of left ventricle   . Fractured shoulder 2009    Right shoulder Fx. from a fall  . Acute on chronic systolic heart failure     secondary to complete heart block  . Automatic implantable cardioverter-defibrillator in situ     placed in 2006; removed on 07/29/2013 (07/29/2013)  . Pacemaker 07/29/2013    Medtronic Adapta L DR implanted with new RA and RV leads inserted  . Exertional shortness of breath   . Stroke 07/2005    "when they took me off heart/lung machine; affected my left foot" (07/29/2013)  . Arthritis     "left wrist; back" (07/29/2013)  . Anxiety     "when I come to the hospital; don't take any RX" (07/29/2013)  . Chronic kidney disease, unspecified   . Overactive bladder   . Coronary artery disease     s/p CABG  . Type II diabetes mellitus   . Hypertension   . Hyperlipidemia   . Carotid stenosis     occluded right intracranial ICA followed by Dr. Donnetta Hutching  . Dysrhythmia   . Paroxysmal atrial fibrillation   . Complete heart block     s/p upgrade to duale chamber PPM  . Severe mitral regurgitation     s/p MV ring  . Chronic diastolic CHF (congestive heart failure)    Past Surgical History  Procedure Laterality Date  . Cardiac defibrillator placement  11/2005    by Dr  Leonia Reeves (MDT) she has a [phone] fidelis lead  . Cataract extraction w/ intraocular lens  implant, bilateral Bilateral 1980's  . Mitral valve replacement  07/2005  . Coronary artery bypass graft  07/2005    "CABG X3" (07/29/2013)  . Salpingoophorectomy Bilateral 1976  . Total hip arthroplasty Right 2001  . Cardiac catheterization  07/2005  . Cardiac valve replacement  07/2005    "mitral valve" (07/29/2013)  . Appendectomy  1949  . Abdominal hysterectomy  ? 1974  . Shoulder arthroscopy w/ rotator cuff repair Right 1998  . Pacemaker placement  07/29/2013    upgrade of single chamber ICD to Medtronic Adapta L DR implanted with new RA and RV leads inserted    Current Outpatient Prescriptions  Medication Sig Dispense Refill  . acetaminophen (TYLENOL) 500 MG tablet Take 500 mg by mouth daily as needed for pain.      Marland Kitchen amLODipine (NORVASC) 10 MG tablet 1 TABLET ONCE A DAY  30 tablet  6  . aspirin EC 81 MG tablet Take 81 mg by mouth every morning.      . carvedilol (COREG) 12.5 MG tablet TAKE 1/2 TABLET TWICE DAILY  30 tablet  5  . Cholecalciferol (VITAMIN D) 2000 UNITS tablet Take 2,000 Units by mouth daily.       Marland Kitchen  ezetimibe-simvastatin (VYTORIN) 10-20 MG per tablet Take 1 tablet by mouth at bedtime.       . fish oil-omega-3 fatty acids 1000 MG capsule Take 1 g by mouth 2 (two) times daily.       . furosemide (LASIX) 40 MG tablet Take 40 mg by mouth daily.       Marland Kitchen glipiZIDE (GLUCOTROL XL) 10 MG 24 hr tablet Take 20 mg by mouth daily.      . hydrALAZINE (APRESOLINE) 25 MG tablet 1 TABLET TWICE A DAY ORALLY  60 tablet  3  . meclizine (ANTIVERT) 25 MG tablet Take 25 mg by mouth as needed for dizziness.       . Multiple Vitamins-Minerals (CENTRUM SILVER PO) Take 1 tablet by mouth daily.       Marland Kitchen oxybutynin (DITROPAN-XL) 10 MG 24 hr tablet Take 10 mg by mouth daily.       . potassium chloride SA (K-DUR,KLOR-CON) 20 MEQ tablet Take 1 tablet (20 mEq total) by mouth daily.  30 tablet  5  . ramipril (ALTACE)  10 MG capsule TAKE 2 CAPSULES BY MOUTH ONCE DAILY  60 capsule  11   No current facility-administered medications for this visit.    Physical Exam: Filed Vitals:   09/01/14 1016  BP: 126/68  Pulse: 71  Height: 5\' 2"  (1.575 m)  Weight: 152 lb 8 oz (69.174 kg)    GEN- The patient is well appearing, alert and oriented x 3 today.   Head- normocephalic, atraumatic Eyes-  Sclera clear, conjunctiva pink Ears- hearing intact Oropharynx- clear Lungs- Clear to ausculation bilaterally, normal work of breathing Chest- pacemaker pocket is well healed Heart- Regular rate and rhythm, no murmurs, rubs or gallops, PMI not laterally displaced GI- soft, NT, ND, + BS Extremities- no clubbing, cyanosis, or edema  Pacemaker interrogation- reviewed in detail today,  See PACEART report  Assessment and Plan:  1. Transient complete heart block Today, she has 1:1 AV conduction Normal pacemaker function See Pace Art report  2. afib No afi since last interrogation Her CHADS2VASC score is at least 8.  I have therefore strongly recommended anticoagulation.  She continues to decline. She can discuss further with Dr Radford Pax upon follow-up  3. HTN Stable No change required today   [facility] Return to the device clinic in 12 months

## 2014-09-08 ENCOUNTER — Encounter: Payer: Self-pay | Admitting: Internal Medicine

## 2014-09-27 ENCOUNTER — Other Ambulatory Visit (HOSPITAL_COMMUNITY): Payer: Self-pay | Admitting: Family Medicine

## 2014-09-27 DIAGNOSIS — Z1231 Encounter for screening mammogram for malignant neoplasm of breast: Secondary | ICD-10-CM

## 2014-10-15 ENCOUNTER — Other Ambulatory Visit: Payer: Self-pay | Admitting: *Deleted

## 2014-10-15 MED ORDER — CARVEDILOL 12.5 MG PO TABS: ORAL_TABLET | ORAL | Status: DC

## 2014-10-18 ENCOUNTER — Ambulatory Visit (HOSPITAL_COMMUNITY)
Admission: RE | Admit: 2014-10-18 | Discharge: 2014-10-18 | Disposition: A | Discharge status: Home / Self Care | Payer: Medicare Other | Source: Ambulatory Visit | Attending: Family Medicine | Admitting: Family Medicine

## 2014-10-18 DIAGNOSIS — Z1231 Encounter for screening mammogram for malignant neoplasm of breast: Secondary | ICD-10-CM | POA: Diagnosis not present

## 2014-10-22 ENCOUNTER — Encounter: Payer: Self-pay | Admitting: Family

## 2014-10-25 ENCOUNTER — Other Ambulatory Visit: Payer: Self-pay | Admitting: Surgery

## 2014-10-25 ENCOUNTER — Ambulatory Visit (INDEPENDENT_AMBULATORY_CARE_PROVIDER_SITE_OTHER): Payer: Medicare Other | Admitting: Family

## 2014-10-25 ENCOUNTER — Ambulatory Visit (HOSPITAL_COMMUNITY)
Admission: RE | Admit: 2014-10-25 | Discharge: 2014-10-25 | Disposition: A | Discharge status: Home / Self Care | Payer: Medicare Other | Source: Ambulatory Visit | Attending: Family | Admitting: Family

## 2014-10-25 ENCOUNTER — Encounter: Payer: Self-pay | Admitting: Family

## 2014-10-25 VITALS — BP 120/74 | HR 79 | Resp 16 | Ht 62.0 in | Wt 152.0 lb

## 2014-10-25 DIAGNOSIS — I6523 Occlusion and stenosis of bilateral carotid arteries: Secondary | ICD-10-CM | POA: Diagnosis not present

## 2014-10-25 LAB — BUN: BUN: 26 mg/dL — ABNORMAL HIGH (ref 6–23)

## 2014-10-25 LAB — CREATININE, SERUM: Creat: 1.36 mg/dL — ABNORMAL HIGH (ref 0.50–1.10)

## 2014-10-25 NOTE — Patient Instructions (Signed)
Stroke Prevention Some medical conditions and behaviors are associated with an increased chance of having a stroke. You may prevent a stroke by making healthy choices and managing medical conditions. HOW CAN I REDUCE MY RISK OF HAVING A STROKE?   Stay physically active. Get at least 30 minutes of activity on most or all days.  Do not smoke. It may also be helpful to avoid exposure to secondhand smoke.  Limit alcohol use. Moderate alcohol use is considered to be:  No more than 2 drinks per day for men.  No more than 1 drink per day for nonpregnant women.  Eat healthy foods. This involves:  Eating 5 or more servings of fruits and vegetables a day.  Making dietary changes that address high blood pressure (hypertension), high cholesterol, diabetes, or obesity.  Manage your cholesterol levels.  Making food choices that are high in fiber and low in saturated fat, trans fat, and cholesterol may control cholesterol levels.  Take any prescribed medicines to control cholesterol as directed by your health care provider.  Manage your diabetes.  Controlling your carbohydrate and sugar intake is recommended to manage diabetes.  Take any prescribed medicines to control diabetes as directed by your health care provider.  Control your hypertension.  Making food choices that are low in salt (sodium), saturated fat, trans fat, and cholesterol is recommended to manage hypertension.  Take any prescribed medicines to control hypertension as directed by your health care provider.  Maintain a healthy weight.  Reducing calorie intake and making food choices that are low in sodium, saturated fat, trans fat, and cholesterol are recommended to manage weight.  Stop drug abuse.  Avoid taking birth control pills.  Talk to your health care provider about the risks of taking birth control pills if you are over 35 years old, smoke, get migraines, or have ever had a blood clot.  Get evaluated for sleep  disorders (sleep apnea).  Talk to your health care provider about getting a sleep evaluation if you snore a lot or have excessive sleepiness.  Take medicines only as directed by your health care provider.  For some people, aspirin or blood thinners (anticoagulants) are helpful in reducing the risk of forming abnormal blood clots that can lead to stroke. If you have the irregular heart rhythm of atrial fibrillation, you should be on a blood thinner unless there is a good reason you cannot take them.  Understand all your medicine instructions.  Make sure that other conditions (such as anemia or atherosclerosis) are addressed. SEEK IMMEDIATE MEDICAL CARE IF:   You have sudden weakness or numbness of the face, arm, or leg, especially on one side of the body.  Your face or eyelid droops to one side.  You have sudden confusion.  You have trouble speaking (aphasia) or understanding.  You have sudden trouble seeing in one or both eyes.  You have sudden trouble walking.  You have dizziness.  You have a loss of balance or coordination.  You have a sudden, severe headache with no known cause.  You have new chest pain or an irregular heartbeat. Any of these symptoms may represent a serious problem that is an emergency. Do not wait to see if the symptoms will go away. Get medical help at once. Call your local emergency services (911 in [place] Do not drive yourself to the hospital. Document Released: 01/24/2005 Document Revised: 05/03/2014 Document Reviewed: 06/19/2013 [employer] Patient Information 2015 [employer]. This information is not intended to replace advice given   to you by your health care provider. Make sure you discuss any questions you have with your health care provider.  

## 2014-10-25 NOTE — Progress Notes (Signed)
Established Carotid Patient   History of Present Illness  [name] is a 80 y.o. female patient of Dr. Donnetta Hutching with no history of carotid intervention, has known right ICA occlusion and mild left ICA stenosis.  She returns today for follow up. She states she was told that she had a mild stroke after her 3 vessel CABG and mitral valve replacement in 2006 that left her with mild numbness in her left toes and mild left facial drooping, but denies TIA or stroke symptoms since then.  Pacemaker insertion July 29, 2013. Her cardiologist is Dr. Joylene Grapes.  Patient denies steal symptoms.  The patient denies amaurosis fugax or monocular blindness.  Pt. denies hemiplegia. The patient denies receptive or expressive aphasia. Pt. reports generalized mild extremity weakness.  Her generalized weakness allows her to walk about 1/2 block.   Patient reports pacemaker insertion July, 2014, skin cancer excise left hand in September, 2015. She states that she bruises easily.  Pt Diabetic: Yes,in control, July, 2015 A1C was 6.6 Pt smoker: non-smoker  Pt meds include:  Statin : Yes  Betablocker: Yes  ASA: Yes, 81 mg daily  Other anticoagulants/antiplatelets: no    Past Medical History  Diagnosis Date  . Ischemic cardiomyopathy     s/p ICD (MDT) by Dr Leonia Reeves 2006, EF 24% at the time but now 55%  . Mitral regurgitation   . Dyslipidemia   . Cerebrovascular disease     extracranial  . Osteoporosis   . Chronic systolic dysfunction of left ventricle   . Fractured shoulder 2009    Right shoulder Fx. from a fall  . Acute on chronic systolic heart failure     secondary to complete heart block  . Automatic implantable cardioverter-defibrillator in situ     placed in 2006; removed on 07/29/2013 (07/29/2013)  . Pacemaker 07/29/2013    Medtronic Adapta L DR implanted with new RA and RV leads inserted  . Exertional shortness of breath   . Stroke 07/2005    "when they took me off heart/lung machine; affected my  left foot" (07/29/2013)  . Arthritis     "left wrist; back" (07/29/2013)  . Anxiety     "when I come to the hospital; don't take any RX" (07/29/2013)  . Chronic kidney disease, unspecified   . Overactive bladder   . Coronary artery disease     s/p CABG  . Type II diabetes mellitus   . Hypertension   . Hyperlipidemia   . Carotid stenosis     occluded right intracranial ICA followed by Dr. Donnetta Hutching  . Dysrhythmia   . Paroxysmal atrial fibrillation   . Complete heart block     s/p upgrade to duale chamber PPM  . Severe mitral regurgitation     s/p MV ring  . Chronic diastolic CHF (congestive heart failure)   . Cancer Sept. 2015    Left Hand  -  Skin Cancer    Social History History  Substance Use Topics  . Smoking status: Never Smoker   . Smokeless tobacco: Never Used  . Alcohol Use: No    Family History Family History  Problem Relation Age of Onset  . Coronary artery disease      family hx of  . Hypertension      family hx of  . Diabetes      family hx of  . Cancer Mother     [name]   . Heart disease Mother     Before age 46  .  Heart disease Sister     Before age 75-  CABG  . Cancer Sister     [name]  . Diabetes Sister   . Hyperlipidemia Sister   . Heart attack Sister   . Diabetes Daughter     Surgical History Past Surgical History  Procedure Laterality Date  . Cardiac defibrillator placement  11/2005    by Dr Leonia Reeves (MDT) she has a [phone] fidelis lead  . Cataract extraction w/ intraocular lens  implant, bilateral Bilateral 1980's  . Mitral valve replacement  07/2005  . Coronary artery bypass graft  07/2005    "CABG X3" (07/29/2013)  . Salpingoophorectomy Bilateral 1976  . Total hip arthroplasty Right 2001  . Cardiac catheterization  07/2005  . Cardiac valve replacement  07/2005    "mitral valve" (07/29/2013)  . Appendectomy  1949  . Shoulder arthroscopy w/ rotator cuff repair Right 1998  . Pacemaker placement  07/29/2013    upgrade of single chamber ICD to  Medtronic Adapta L DR implanted with new RA and RV leads inserted  . Joint replacement Right 2006    Hip  . Abdominal hysterectomy  ? 1974    Allergies  Allergen Reactions  . Fosamax [Alendronate] Anaphylaxis  . Alendronate Sodium Other (See Comments)    Pt does not recall being allergic  . Clonidine Derivatives Other (See Comments)    Pt does not recall allergy  . Darvocet [Propoxyphene N-Acetaminophen] Nausea And Vomiting  . Horse-Derived Products   . Sulfa Drugs Cross Reactors Hives and Itching  . Tramadol Nausea And Vomiting  . Vicodin [Hydrocodone-Acetaminophen] Other (See Comments)    lightheaded  . Penicillins Rash  . Septra [Sulfamethoxazole-Trimethoprim] Rash    Current Outpatient Prescriptions  Medication Sig Dispense Refill  . acetaminophen (TYLENOL) 500 MG tablet Take 500 mg by mouth daily as needed for pain.      Marland Kitchen amLODipine (NORVASC) 10 MG tablet 1 TABLET ONCE A DAY  30 tablet  6  . aspirin EC 81 MG tablet Take 81 mg by mouth every morning.      . carvedilol (COREG) 12.5 MG tablet TAKE 1/2 TABLET TWICE DAILY  15 tablet  5  . Cholecalciferol (VITAMIN D) 2000 UNITS tablet Take 2,000 Units by mouth daily.       Marland Kitchen ezetimibe-simvastatin (VYTORIN) 10-20 MG per tablet Take 1 tablet by mouth at bedtime.       . fish oil-omega-3 fatty acids 1000 MG capsule Take 1 g by mouth 2 (two) times daily.       . furosemide (LASIX) 40 MG tablet Take 40 mg by mouth daily.       Marland Kitchen glipiZIDE (GLUCOTROL XL) 10 MG 24 hr tablet Take 20 mg by mouth daily.      . hydrALAZINE (APRESOLINE) 25 MG tablet 1 TABLET TWICE A DAY ORALLY  60 tablet  3  . meclizine (ANTIVERT) 25 MG tablet Take 25 mg by mouth as needed for dizziness.       . Multiple Vitamins-Minerals (CENTRUM SILVER PO) Take 1 tablet by mouth daily.       Marland Kitchen oxybutynin (DITROPAN-XL) 10 MG 24 hr tablet Take 10 mg by mouth daily.       . potassium chloride SA (K-DUR,KLOR-CON) 20 MEQ tablet Take 1 tablet (20 mEq total) by mouth daily.  30  tablet  5  . ramipril (ALTACE) 10 MG capsule TAKE 2 CAPSULES BY MOUTH ONCE DAILY  60 capsule  11   No current facility-administered medications for  this visit.    Review of Systems : See HPI for pertinent positives and negatives.  Physical Examination  Filed Vitals:   10/25/14 1044 10/25/14 1046  BP: 144/66 120/74  Pulse: 62 79  Resp:  16  Height:  5\' 2"  (1.575 m)  Weight:  152 lb (68.947 kg)  SpO2:  98%   Body mass index is 27.79 kg/(m^2).   General: WDWN female in NAD  GAIT: slow and deliberate.  Eyes: PERRLA  Pulmonary: CTAB, Negative Rales, Negative rhonchi, & Negative wheezing.  Cardiac: Regular Rhythm , positive Murmur.   VASCULAR EXAM  Carotid Bruits  Left  Right    Negative  Negative   Aorta is not palpable.  Radial pulses are 2+ palpable and equal.   LE Pulses  LEFT  RIGHT   FEMORAL  not palpable  not palpable   POPLITEAL  not palpable  not palpable   POSTERIOR TIBIAL  not palpable  not palpable   DORSALIS PEDIS  ANTERIOR TIBIAL  palpable  not palpable   Gastrointestinal: soft, nontender, BS WNL, no r/g, negative masses.  Musculoskeletal: Negative muscle atrophy/wasting. M/S 4/5 in UE's, 3/5 in LE's, Extremities without ischemic changes.  Neurologic: A&O X 3; Appropriate Affect ; SENSATION ;normal;  Speech is normal  CN 2-12 intact, Pain and light touch intact in extremities, Motor exam as listed above.       Non-Invasive Vascular Imaging CAROTID DUPLEX 10/25/2014   CEREBROVASCULAR DUPLEX EVALUATION    INDICATION: Carotid artery disease    PREVIOUS INTERVENTION(S): None, History of right internal carotid artery occlusion    DUPLEX EXAM: Carotid duplex    RIGHT  LEFT  Peak Systolic Velocities (cm/s) End Diastolic Velocities (cm/s) Plaque LOCATION Peak Systolic Velocities (cm/s) End Diastolic Velocities (cm/s) Plaque  94 2 - CCA PROXIMAL 80 14 HT  87 0 - CCA MID 92 19 -  51 0 HT CCA DISTAL 81 16 HT  85 4 HT ECA 77 5 -  29 0 HT ICA PROXIMAL 70  16 CP  33 0 - ICA MID 145 30 -  62 0 - ICA DISTAL 96 25 -    N/A ICA / CCA Ratio (PSV) 1.5  Antegrade Vertebral Flow Antegrade  272 Brachial Systolic Pressure (mmHg) 536  Triphasic Brachial Artery Waveforms Triphasic    Plaque Morphology:  HM = Homogeneous, HT = Heterogeneous, CP = Calcific Plaque, SP = Smooth Plaque, IP = Irregular Plaque     ADDITIONAL FINDINGS:     IMPRESSION: 1. Reconstitution of flow with blunted waveforms in the right internal carotid artery. 2. Less than 40% left internal carotid artery stenosis.    Compared to the previous exam:  No significant change       Assessment: MALASHIA KAMAKA is a 80 y.o. female with no history of carotid intervention, has known right ICA occlusion and mild left ICA stenosis. She was told that she had a mild stroke after her 3 vessel CABG and mitral valve replacement in 2006 that left her with mild numbness in her left toes and mild left facial drooping, but denies TIA or stroke symptoms since then.  Today's carotid Duplex indicates reconstitution of flow with blunted waveforms in the right internal carotid artery and less than 40% left internal carotid artery stenosis. The  ICA stenosis is  Unchanged from previous exam. After discussing carotid Duplex results, HPI, and physical exam results with Dr. Trula Slade, the patient will be scheduled for CTA of head and neck to determine  if the right ICA has spontaneously recannulized.  07/07/14 creatinine was 1.2.  Plan: Based on today's carotid Duplex and physical exam, and after discussing with Dr. Trula Slade and the sonographer, patient will be scheduled for CTA of head and neck  I discussed in depth with the patient the nature of atherosclerosis, and emphasized the importance of maximal medical management including strict control of blood pressure, blood glucose, and lipid levels, obtaining regular exercise, and continued cessation of smoking.  The patient is aware that without maximal medical  management the underlying atherosclerotic disease process will progress, limiting the benefit of any interventions. The patient was given information about stroke prevention and what symptoms should prompt the patient to seek immediate medical care. Thank you for allowing [place] to participate in this patient's care.  Clemon Chambers, RN, MSN, FNP-C [facility] Office: [facility] Physician: Trula Slade  10/25/2014 10:59 AM

## 2014-11-01 ENCOUNTER — Encounter: Payer: Self-pay | Admitting: Family

## 2014-11-02 ENCOUNTER — Ambulatory Visit (INDEPENDENT_AMBULATORY_CARE_PROVIDER_SITE_OTHER): Payer: Medicare Other | Admitting: Vascular Surgery

## 2014-11-02 ENCOUNTER — Ambulatory Visit
Admission: RE | Admit: 2014-11-02 | Discharge: 2014-11-02 | Disposition: A | Discharge status: Home / Self Care | Payer: Medicare Other | Source: Ambulatory Visit | Attending: Family | Admitting: Family

## 2014-11-02 ENCOUNTER — Encounter: Payer: Self-pay | Admitting: Vascular Surgery

## 2014-11-02 VITALS — BP 144/56 | HR 75 | Ht 60.0 in | Wt 150.0 lb

## 2014-11-02 DIAGNOSIS — I6523 Occlusion and stenosis of bilateral carotid arteries: Secondary | ICD-10-CM

## 2014-11-02 MED ORDER — IOHEXOL 350 MG/ML SOLN
80.0000 mL | Freq: Once | INTRAVENOUS | Status: AC | PRN
  Administered 2014-11-02: 80 mL via INTRAVENOUS

## 2014-11-02 NOTE — Addendum Note (Signed)
Addended by: Mena Goes on: 11/02/2014 04:28 PM   Modules accepted: Orders

## 2014-11-02 NOTE — Progress Notes (Signed)
The patient presents today for continued discussion of her extracranial cerebrovascular occlusive disease. We have followed her for number of years with suspected right internal carotid artery occlusion. She recently underwent a repeat duplex and this suggested potential reconsultation flow in her right internal carotid artery. She continues to have no significant stenosis or left carotid was recommended that she undergo a CT angiogram for further discussion. She does report dizziness and unsteady gait but no focal neurologic deficits. She underwent CT scan today and she is here today for discussion of this.  Past Medical History  Diagnosis Date  . Ischemic cardiomyopathy     s/p ICD (MDT) by Dr Leonia Reeves 2006, EF 24% at the time but now 55%  . Mitral regurgitation   . Dyslipidemia   . Cerebrovascular disease     extracranial  . Osteoporosis   . Chronic systolic dysfunction of left ventricle   . Fractured shoulder 2009    Right shoulder Fx. from a fall  . Acute on chronic systolic heart failure     secondary to complete heart block  . Automatic implantable cardioverter-defibrillator in situ     placed in 2006; removed on 07/29/2013 (07/29/2013)  . Pacemaker 07/29/2013    Medtronic Adapta L DR implanted with new RA and RV leads inserted  . Exertional shortness of breath   . Stroke 07/2005    "when they took me off heart/lung machine; affected my left foot" (07/29/2013)  . Arthritis     "left wrist; back" (07/29/2013)  . Anxiety     "when I come to the hospital; don't take any RX" (07/29/2013)  . Chronic kidney disease, unspecified   . Overactive bladder   . Coronary artery disease     s/p CABG  . Type II diabetes mellitus   . Hypertension   . Hyperlipidemia   . Carotid stenosis     occluded right intracranial ICA followed by Dr. Donnetta Hutching  . Dysrhythmia   . Paroxysmal atrial fibrillation   . Complete heart block     s/p upgrade to duale chamber PPM  . Severe mitral regurgitation     s/p MV  ring  . Chronic diastolic CHF (congestive heart failure)   . Cancer Sept. 2015    Left Hand  -  Skin Cancer    History  Substance Use Topics  . Smoking status: Never Smoker   . Smokeless tobacco: Never Used  . Alcohol Use: No    Family History  Problem Relation Age of Onset  . Coronary artery disease      family hx of  . Hypertension      family hx of  . Diabetes      family hx of  . Cancer Mother     Theadora Rama   . Heart disease Mother     Before age 61  . Heart disease Sister     Before age 33-  CABG  . Cancer Sister     Lonia Chimera  . Diabetes Sister   . Hyperlipidemia Sister   . Heart attack Sister   . Diabetes Daughter     Allergies  Allergen Reactions  . Fosamax [Alendronate] Anaphylaxis  . Alendronate Sodium Other (See Comments)    Pt does not recall being allergic  . Clonidine Derivatives Other (See Comments)    Pt does not recall allergy  . Darvocet [Propoxyphene N-Acetaminophen] Nausea And Vomiting  . Horse-Derived Products   . Sulfa Drugs Cross Reactors Hives and Itching  . Tramadol Nausea  And Vomiting  . Vicodin [Hydrocodone-Acetaminophen] Other (See Comments)    lightheaded  . Penicillins Rash  . Septra [Sulfamethoxazole-Trimethoprim] Rash    Current outpatient prescriptions: acetaminophen (TYLENOL) 500 MG tablet, Take 500 mg by mouth daily as needed for pain., Disp: , Rfl: ;  amLODipine (NORVASC) 10 MG tablet, 1 TABLET ONCE A DAY, Disp: 30 tablet, Rfl: 6;  aspirin EC 81 MG tablet, Take 81 mg by mouth every morning., Disp: , Rfl: ;  carvedilol (COREG) 12.5 MG tablet, TAKE 1/2 TABLET TWICE DAILY, Disp: 15 tablet, Rfl: 5 Cholecalciferol (VITAMIN D) 2000 UNITS tablet, Take 2,000 Units by mouth daily. , Disp: , Rfl: ;  ezetimibe-simvastatin (VYTORIN) 10-20 MG per tablet, Take 1 tablet by mouth at bedtime. , Disp: , Rfl: ;  fish oil-omega-3 fatty acids 1000 MG capsule, Take 1 g by mouth 2 (two) times daily. , Disp: , Rfl: ;  furosemide (LASIX) 40 MG tablet, Take  40 mg by mouth daily. , Disp: , Rfl:  glipiZIDE (GLUCOTROL XL) 10 MG 24 hr tablet, Take 20 mg by mouth daily., Disp: , Rfl: ;  hydrALAZINE (APRESOLINE) 25 MG tablet, 1 TABLET TWICE A DAY ORALLY, Disp: 60 tablet, Rfl: 3;  meclizine (ANTIVERT) 25 MG tablet, Take 25 mg by mouth as needed for dizziness. , Disp: , Rfl: ;  Multiple Vitamins-Minerals (CENTRUM SILVER PO), Take 1 tablet by mouth daily. , Disp: , Rfl:  oxybutynin (DITROPAN-XL) 10 MG 24 hr tablet, Take 10 mg by mouth daily. , Disp: , Rfl: ;  potassium chloride SA (K-DUR,KLOR-CON) 20 MEQ tablet, Take 1 tablet (20 mEq total) by mouth daily., Disp: 30 tablet, Rfl: 5;  ramipril (ALTACE) 10 MG capsule, TAKE 2 CAPSULES BY MOUTH ONCE DAILY, Disp: 60 capsule, Rfl: 11  BP 144/56 mmHg  Pulse 75  Ht 5' (1.524 m)  Wt 150 lb (68.04 kg)  BMI 29.30 kg/m2  SpO2 100%  Body mass index is 29.3 kg/(m^2).       On physical exam she does not have carotid bruits bilaterally Grossly intact neurologically Palpable radial pulses bilaterally  CT angiogram of her head and neck revealed patency of her right carotid bifurcation. She has a subtotal or total occlusion of the intracranial portion of her internal carotid artery making her extracranial internal carotid quite small. This was compared to MRA in 2006 showing no change. Left carotid system shows no stenosis  I discussed this at length with the patient. Explained this is a stable situation would not recommend any other evaluation or treatment. Recommend that we see her again with carotid duplex in one year

## 2014-11-08 ENCOUNTER — Encounter: Payer: Self-pay | Admitting: Cardiology

## 2014-11-08 ENCOUNTER — Ambulatory Visit (INDEPENDENT_AMBULATORY_CARE_PROVIDER_SITE_OTHER): Payer: Medicare Other | Admitting: Cardiology

## 2014-11-08 ENCOUNTER — Other Ambulatory Visit: Payer: Self-pay

## 2014-11-08 VITALS — BP 124/58 | HR 81 | Ht 62.0 in | Wt 149.0 lb

## 2014-11-08 DIAGNOSIS — I2583 Coronary atherosclerosis due to lipid rich plaque: Principal | ICD-10-CM

## 2014-11-08 DIAGNOSIS — I5032 Chronic diastolic (congestive) heart failure: Secondary | ICD-10-CM

## 2014-11-08 DIAGNOSIS — I340 Nonrheumatic mitral (valve) insufficiency: Secondary | ICD-10-CM

## 2014-11-08 DIAGNOSIS — I2510 Atherosclerotic heart disease of native coronary artery without angina pectoris: Secondary | ICD-10-CM

## 2014-11-08 DIAGNOSIS — E875 Hyperkalemia: Secondary | ICD-10-CM

## 2014-11-08 DIAGNOSIS — E785 Hyperlipidemia, unspecified: Secondary | ICD-10-CM

## 2014-11-08 DIAGNOSIS — I10 Essential (primary) hypertension: Secondary | ICD-10-CM

## 2014-11-08 LAB — BASIC METABOLIC PANEL
BUN: 34 mg/dL — ABNORMAL HIGH (ref 6–23)
CALCIUM: 9.9 mg/dL (ref 8.4–10.5)
CO2: 22 meq/L (ref 19–32)
Chloride: 109 mEq/L (ref 96–112)
Creatinine, Ser: 1.4 mg/dL — ABNORMAL HIGH (ref 0.4–1.2)
GFR: 38.04 mL/min — AB (ref >60.00)
Glucose, Bld: 123 mg/dL — ABNORMAL HIGH (ref 70–99)
POTASSIUM: 5.4 meq/L — AB (ref 3.5–5.1)
SODIUM: 139 meq/L (ref 135–145)

## 2014-11-08 LAB — LIPID PANEL
CHOLESTEROL: 133 mg/dL (ref 0–200)
HDL: 33.6 mg/dL — ABNORMAL LOW (ref >39.00)
LDL Cholesterol: 68 mg/dL (ref 0–99)
NonHDL: 99.4
Total CHOL/HDL Ratio: 4
Triglycerides: 157 mg/dL — ABNORMAL HIGH (ref 0.0–149.0)
VLDL: 31.4 mg/dL (ref 0.0–40.0)

## 2014-11-08 LAB — ALT: ALT: 15 U/L (ref 0–35)

## 2014-11-08 NOTE — Patient Instructions (Signed)
Your physician recommends that you continue on your current medications as directed. Please refer to the Current Medication list given to you today.  Labs today: Fasting lipid, ALT, BMET  Your physician wants you to follow-up in: 6 months with Dr. Radford Pax.  You will receive a reminder letter in the mail two months in advance. If you don't receive a letter, please call our office to schedule the follow-up appointment.

## 2014-11-08 NOTE — Progress Notes (Signed)
[place], [facility] [place], [place]  [address] Phone: [phone] Fax:  [phone]  Date:  11/08/2014   ID:  [name], DOB Jul 10, 1928, MRN [id number]  PCP:  Mayra Neer, MD  Cardiologist:  Fransico Him, MD    History of Present Illness: [name] is a 80 y.o. female who presents today for followup of her ischemic DCM, chronic diastolic CHF, ASCAD, dyslipidemia, MR s/p MV repair, and HTN. She is doing well. She denies any chest pain, LE edema, palpitations or syncope. She occasionally has some DOE if she is rushing but otherwise no problems with SOB. She occasionally will fell dizzy when going from sitting to standing or standing for a while.    Wt Readings from Last 3 Encounters:  11/08/14 149 lb (67.586 kg)  11/02/14 150 lb (68.04 kg)  10/25/14 152 lb (68.947 kg)     Past Medical History  Diagnosis Date  . Ischemic cardiomyopathy     s/p ICD (MDT) by Dr Leonia Reeves 2006, EF 24% at the time but now 55%  . Mitral regurgitation   . Dyslipidemia   . Cerebrovascular disease     extracranial  . Osteoporosis   . Chronic systolic dysfunction of left ventricle   . Fractured shoulder 2009    Right shoulder Fx. from a fall  . Acute on chronic systolic heart failure     secondary to complete heart block  . Automatic implantable cardioverter-defibrillator in situ     placed in 2006; removed on 07/29/2013 (07/29/2013)  . Pacemaker 07/29/2013    Medtronic Adapta L DR implanted with new RA and RV leads inserted  . Exertional shortness of breath   . Stroke 07/2005    "when they took me off heart/lung machine; affected my left foot" (07/29/2013)  . Arthritis     "left wrist; back" (07/29/2013)  . Anxiety     "when I come to the hospital; don't take any RX" (07/29/2013)  . Chronic kidney disease, unspecified   . Overactive bladder   . Coronary artery disease     s/p CABG  . Type II diabetes mellitus   . Hypertension   . Hyperlipidemia   . Carotid stenosis    occluded right intracranial ICA followed by Dr. Donnetta Hutching  . Dysrhythmia   . Paroxysmal atrial fibrillation   . Complete heart block     s/p upgrade to duale chamber PPM  . Severe mitral regurgitation     s/p MV ring  . Chronic diastolic CHF (congestive heart failure)   . Cancer Sept. 2015    Left Hand  -  Skin Cancer    Current Outpatient Prescriptions  Medication Sig Dispense Refill  . acetaminophen (TYLENOL) 500 MG tablet Take 500 mg by mouth daily as needed for pain.    Marland Kitchen amLODipine (NORVASC) 10 MG tablet 1 TABLET ONCE A DAY (Patient taking differently: TAKE 1 TABLET BY MOUTH ONCE A DAY) 30 tablet 6  . aspirin EC 81 MG tablet Take 81 mg by mouth every morning.    . carvedilol (COREG) 12.5 MG tablet TAKE 1/2 TABLET TWICE DAILY (Patient taking differently: TAKE 1/2 TABLET BY MOUTH TWICE DAILY) 15 tablet 5  . Cholecalciferol (VITAMIN D) 2000 UNITS tablet Take 2,000 Units by mouth daily.     Marland Kitchen ezetimibe-simvastatin (VYTORIN) 10-20 MG per tablet Take 1 tablet by mouth at bedtime.     . fish oil-omega-3 fatty acids 1000 MG capsule Take 1 g by mouth 2 (two) times  daily.     . furosemide (LASIX) 40 MG tablet Take 20 mg by mouth 2 (two) times daily.     Marland Kitchen glipiZIDE (GLUCOTROL XL) 10 MG 24 hr tablet Take 20 mg by mouth daily.    . hydrALAZINE (APRESOLINE) 25 MG tablet 1 TABLET TWICE A DAY ORALLY (Patient taking differently: 1 TABLET BY MOUTH TWICE A DAY ORALLY) 60 tablet 3  . meclizine (ANTIVERT) 25 MG tablet Take 25 mg by mouth daily as needed for dizziness.     . Multiple Vitamins-Minerals (CENTRUM SILVER PO) Take 1 tablet by mouth daily.     Marland Kitchen oxybutynin (DITROPAN-XL) 10 MG 24 hr tablet Take 10 mg by mouth daily.     . potassium chloride SA (K-DUR,KLOR-CON) 20 MEQ tablet Take 1 tablet (20 mEq total) by mouth daily. 30 tablet 5  . ramipril (ALTACE) 10 MG capsule TAKE 2 CAPSULES BY MOUTH ONCE DAILY 60 capsule 11   No current facility-administered medications for this visit.    Allergies:      Allergies  Allergen Reactions  . Fosamax [Alendronate] Anaphylaxis  . Horse-Derived Products Hives  . Alendronate Sodium Other (See Comments)    UNKNOWN. Pt does not recall being allergic  . Clonidine Derivatives Other (See Comments)    UNKNOWN. Pt does not recall allergy  . Darvocet [Propoxyphene N-Acetaminophen] Nausea And Vomiting  . Sulfa Drugs Cross Reactors Hives and Itching  . Tramadol Nausea And Vomiting  . Vicodin [Hydrocodone-Acetaminophen] Other (See Comments)    lightheaded  . Penicillins Rash  . Septra [Sulfamethoxazole-Trimethoprim] Rash    Social History:  The patient  reports that she has never smoked. She has never used smokeless tobacco. She reports that she does not drink alcohol or use illicit drugs.   Family History:  The patient's family history includes Cancer in her mother and sister; Coronary artery disease in an other family member; Diabetes in her daughter, sister, and another family member; Heart attack in her sister; Heart disease in her mother and sister; Hyperlipidemia in her sister; Hypertension in an other family member.   ROS:  Please see the history of present illness.      All other systems reviewed and negative.   PHYSICAL EXAM: VS:  BP 124/58 mmHg  Pulse 81  Ht 5\' 2"  (1.575 m)  Wt 149 lb (67.586 kg)  BMI 27.25 kg/m2 Well nourished, well developed, in no acute distress HEENT: normal Neck: no JVD Cardiac:  normal S1, S2; RRR; no murmur Lungs:  clear to auscultation bilaterally, no wheezing, rhonchi or rales Abd: soft, nontender, no hepatomegaly Ext: no edema Skin: warm and dry Neuro:  CNs 2-12 intact, no focal abnormalities noted  ASSESSMENT AND PLAN:  1. ASCAD s/p CABG with no angina - continue ASA  2. HTN - well controlled - continue amlodipine/Coreg/Hydralazine/Ramipril  3. Dyslipidemia LDL at goal 57 by lipids 06/2013 - continue Vytorin/fish oil  - recheck fasting lipids and ALT 4. MR s/p MV repair 5. Chronic diastolic CHF -  well compensated - continue ramipril/Coreg and Lasix  - check BMET 6. CHB s/p PPM 7. Ischemic DCM - resolved EF 55%  Followup with me in 6 months         Signed, Fransico Him, MD [facility] 11/08/2014 9:12 AM

## 2014-11-12 ENCOUNTER — Other Ambulatory Visit: Payer: Self-pay | Admitting: *Deleted

## 2014-11-12 MED ORDER — RAMIPRIL 10 MG PO CAPS: ORAL_CAPSULE | ORAL | Status: DC

## 2014-11-15 ENCOUNTER — Other Ambulatory Visit: Payer: Self-pay

## 2014-11-15 ENCOUNTER — Telehealth: Payer: Self-pay

## 2014-11-15 ENCOUNTER — Other Ambulatory Visit (INDEPENDENT_AMBULATORY_CARE_PROVIDER_SITE_OTHER): Payer: Medicare Other | Admitting: *Deleted

## 2014-11-15 DIAGNOSIS — I10 Essential (primary) hypertension: Secondary | ICD-10-CM

## 2014-11-15 DIAGNOSIS — E875 Hyperkalemia: Secondary | ICD-10-CM

## 2014-11-15 LAB — BASIC METABOLIC PANEL
BUN: 30 mg/dL — AB (ref 6–23)
CHLORIDE: 109 meq/L (ref 96–112)
CO2: 21 mEq/L (ref 19–32)
Calcium: 9.8 mg/dL (ref 8.4–10.5)
Creatinine, Ser: 1.3 mg/dL — ABNORMAL HIGH (ref 0.4–1.2)
GFR: 40.71 mL/min — ABNORMAL LOW (ref >60.00)
Glucose, Bld: 130 mg/dL — ABNORMAL HIGH (ref 70–99)
Potassium: 5 mEq/L (ref 3.5–5.1)
Sodium: 143 mEq/L (ref 135–145)

## 2014-11-15 NOTE — Telephone Encounter (Signed)
Instructed patient to STOP potassium. BMET scheduled for 11/23.  Patient agrees with treatment plan.

## 2014-11-22 ENCOUNTER — Other Ambulatory Visit (INDEPENDENT_AMBULATORY_CARE_PROVIDER_SITE_OTHER): Payer: Medicare Other | Admitting: *Deleted

## 2014-11-22 DIAGNOSIS — I10 Essential (primary) hypertension: Secondary | ICD-10-CM

## 2014-11-22 LAB — BASIC METABOLIC PANEL
BUN: 16 mg/dL (ref 6–23)
CO2: 22 meq/L (ref 19–32)
Calcium: 9.5 mg/dL (ref 8.4–10.5)
Chloride: 107 mEq/L (ref 96–112)
Creatinine, Ser: 1.1 mg/dL (ref 0.4–1.2)
GFR: 48.21 mL/min — ABNORMAL LOW (ref >60.00)
GLUCOSE: 123 mg/dL — AB (ref 70–99)
POTASSIUM: 4.3 meq/L (ref 3.5–5.1)
SODIUM: 140 meq/L (ref 135–145)

## 2014-12-02 ENCOUNTER — Ambulatory Visit (INDEPENDENT_AMBULATORY_CARE_PROVIDER_SITE_OTHER): Payer: Medicare Other | Admitting: *Deleted

## 2014-12-02 DIAGNOSIS — I442 Atrioventricular block, complete: Secondary | ICD-10-CM

## 2014-12-02 LAB — MDC_IDC_ENUM_SESS_TYPE_REMOTE
Battery Impedance: 100 Ohm
Battery Voltage: 2.78 V
Brady Statistic AP VP Percent: 0 %
Brady Statistic AP VS Percent: 91 %
Brady Statistic AS VS Percent: 9 %
Date Time Interrogation Session: 20151203135047
Lead Channel Impedance Value: 429 Ohm
Lead Channel Impedance Value: 437 Ohm
Lead Channel Pacing Threshold Amplitude: 0.5 V
Lead Channel Pacing Threshold Pulse Width: 0.4 ms
Lead Channel Pacing Threshold Pulse Width: 0.4 ms
Lead Channel Setting Pacing Amplitude: 2 V
Lead Channel Setting Pacing Pulse Width: 0.4 ms
MDC IDC MSMT BATTERY REMAINING LONGEVITY: 160 mo
MDC IDC MSMT LEADCHNL RV PACING THRESHOLD AMPLITUDE: 0.625 V
MDC IDC MSMT LEADCHNL RV SENSING INTR AMPL: 8 mV
MDC IDC SET LEADCHNL RA PACING AMPLITUDE: 1.5 V
MDC IDC SET LEADCHNL RV SENSING SENSITIVITY: 4 mV
MDC IDC STAT BRADY AS VP PERCENT: 0 %

## 2014-12-03 NOTE — Progress Notes (Signed)
Remote pacemaker transmission.   

## 2014-12-09 ENCOUNTER — Encounter (HOSPITAL_COMMUNITY): Payer: Self-pay | Admitting: Internal Medicine

## 2014-12-20 ENCOUNTER — Encounter: Payer: Self-pay | Admitting: Cardiology

## 2014-12-27 ENCOUNTER — Encounter: Payer: Self-pay | Admitting: Internal Medicine

## 2015-01-28 ENCOUNTER — Other Ambulatory Visit: Payer: Self-pay

## 2015-01-28 MED ORDER — CARVEDILOL 12.5 MG PO TABS: ORAL_TABLET | ORAL | Status: DC

## 2015-02-14 ENCOUNTER — Encounter: Payer: Self-pay | Admitting: Cardiology

## 2015-03-02 ENCOUNTER — Telehealth: Payer: Self-pay | Admitting: Internal Medicine

## 2015-03-02 NOTE — Telephone Encounter (Signed)
New Msg        Pt calling about remote transmission.  States she needs clarification on the process, thinks it has changed?   Please call.

## 2015-03-02 NOTE — Telephone Encounter (Signed)
Pt questioning what time to send transmission. Pt instructed can send at any time on 03-07-15//kwm

## 2015-03-02 NOTE — Telephone Encounter (Signed)
Line busy//kwm

## 2015-03-07 ENCOUNTER — Ambulatory Visit (INDEPENDENT_AMBULATORY_CARE_PROVIDER_SITE_OTHER): Payer: Commercial Managed Care - HMO | Admitting: *Deleted

## 2015-03-07 ENCOUNTER — Telehealth: Payer: Self-pay | Admitting: Internal Medicine

## 2015-03-07 DIAGNOSIS — I442 Atrioventricular block, complete: Secondary | ICD-10-CM

## 2015-03-07 NOTE — Telephone Encounter (Signed)
[name] made aware of received transmission.

## 2015-03-07 NOTE — Telephone Encounter (Signed)
New Msg        Pt calling to see if transmission was received today and is requesting call back.

## 2015-03-07 NOTE — Progress Notes (Signed)
Remote pacemaker transmission.   

## 2015-03-10 LAB — MDC_IDC_ENUM_SESS_TYPE_REMOTE
Battery Impedance: 100 Ohm
Battery Voltage: 2.78 V
Lead Channel Pacing Threshold Pulse Width: 0.4 ms
Lead Channel Sensing Intrinsic Amplitude: 8 mV
Lead Channel Setting Pacing Amplitude: 1.5 V
Lead Channel Setting Pacing Amplitude: 2 V
Lead Channel Setting Pacing Pulse Width: 0.4 ms
MDC IDC MSMT BATTERY REMAINING LONGEVITY: 162 mo
MDC IDC MSMT LEADCHNL RA IMPEDANCE VALUE: 453 Ohm
MDC IDC MSMT LEADCHNL RA PACING THRESHOLD AMPLITUDE: 0.625 V
MDC IDC MSMT LEADCHNL RV IMPEDANCE VALUE: 449 Ohm
MDC IDC MSMT LEADCHNL RV PACING THRESHOLD AMPLITUDE: 0.625 V
MDC IDC MSMT LEADCHNL RV PACING THRESHOLD PULSEWIDTH: 0.4 ms
MDC IDC SESS DTM: 20160307120356
MDC IDC SET LEADCHNL RV SENSING SENSITIVITY: 4 mV
MDC IDC STAT BRADY AP VP PERCENT: 0 %
MDC IDC STAT BRADY AP VS PERCENT: 86 %
MDC IDC STAT BRADY AS VP PERCENT: 0 %
MDC IDC STAT BRADY AS VS PERCENT: 14 %

## 2015-03-14 ENCOUNTER — Encounter: Payer: Self-pay | Admitting: Cardiology

## 2015-03-21 ENCOUNTER — Encounter: Payer: Self-pay | Admitting: Internal Medicine

## 2015-03-28 ENCOUNTER — Other Ambulatory Visit: Payer: Self-pay | Admitting: Cardiology

## 2015-03-28 MED ORDER — AMLODIPINE BESYLATE 10 MG PO TABS: ORAL_TABLET | ORAL | Status: DC

## 2015-03-30 ENCOUNTER — Other Ambulatory Visit: Payer: Self-pay | Admitting: Cardiology

## 2015-05-12 ENCOUNTER — Ambulatory Visit (INDEPENDENT_AMBULATORY_CARE_PROVIDER_SITE_OTHER): Payer: Commercial Managed Care - HMO | Admitting: Cardiology

## 2015-05-12 ENCOUNTER — Encounter: Payer: Self-pay | Admitting: Cardiology

## 2015-05-12 VITALS — BP 140/62 | HR 64 | Ht 62.0 in | Wt 146.1 lb

## 2015-05-12 DIAGNOSIS — I5032 Chronic diastolic (congestive) heart failure: Secondary | ICD-10-CM | POA: Diagnosis not present

## 2015-05-12 DIAGNOSIS — I10 Essential (primary) hypertension: Secondary | ICD-10-CM

## 2015-05-12 DIAGNOSIS — I442 Atrioventricular block, complete: Secondary | ICD-10-CM

## 2015-05-12 DIAGNOSIS — I340 Nonrheumatic mitral (valve) insufficiency: Secondary | ICD-10-CM

## 2015-05-12 DIAGNOSIS — I2510 Atherosclerotic heart disease of native coronary artery without angina pectoris: Secondary | ICD-10-CM | POA: Diagnosis not present

## 2015-05-12 DIAGNOSIS — I2583 Coronary atherosclerosis due to lipid rich plaque: Secondary | ICD-10-CM

## 2015-05-12 LAB — HEPATIC FUNCTION PANEL
ALBUMIN: 4.2 g/dL (ref 3.5–5.2)
ALT: 11 U/L (ref 0–35)
AST: 16 U/L (ref 0–37)
Alkaline Phosphatase: 58 U/L (ref 39–117)
BILIRUBIN DIRECT: 0.1 mg/dL (ref 0.0–0.3)
Total Bilirubin: 0.4 mg/dL (ref 0.2–1.2)
Total Protein: 7.2 g/dL (ref 6.0–8.3)

## 2015-05-12 LAB — LIPID PANEL
CHOLESTEROL: 162 mg/dL (ref 0–200)
HDL: 54.3 mg/dL (ref >39.00)
LDL Cholesterol: 83 mg/dL (ref 0–99)
NonHDL: 107.7
Total CHOL/HDL Ratio: 3
Triglycerides: 124 mg/dL (ref 0.0–149.0)
VLDL: 24.8 mg/dL (ref 0.0–40.0)

## 2015-05-12 LAB — BASIC METABOLIC PANEL
BUN: 23 mg/dL (ref 6–23)
CHLORIDE: 106 meq/L (ref 96–112)
CO2: 29 mEq/L (ref 19–32)
Calcium: 10.4 mg/dL (ref 8.4–10.5)
Creatinine, Ser: 1.14 mg/dL (ref 0.40–1.20)
GFR: 48.16 mL/min — ABNORMAL LOW (ref >60.00)
Glucose, Bld: 71 mg/dL (ref 70–99)
POTASSIUM: 4.3 meq/L (ref 3.5–5.1)
Sodium: 141 mEq/L (ref 135–145)

## 2015-05-12 NOTE — Progress Notes (Signed)
Cardiology Office Note   Date:  05/12/2015   ID:  [name], DOB Sep 30, 1928, MRN [id number]  PCP:  Mayra Neer, MD    Chief Complaint  Patient presents with  . Coronary Artery Disease  . Hypertension  . Congestive Heart Failure      History of Present Illness: [name] is a 81 y.o. female who presents today for followup of her ischemic DCM, chronic diastolic CHF, ASCAD, dyslipidemia, MR s/p MV repair, and HTN. She is doing well. She denies any chest pain, LE edema, palpitations or syncope. She occasionally has some DOE if she is rushing but otherwise no problems with SOB. She occasionally will fell dizzy when going from sitting to standing or standing for a while.    Past Medical History  Diagnosis Date  . Ischemic cardiomyopathy     s/p ICD (MDT) by Dr Leonia Reeves 2006, EF 24% at the time but now 55%  . Mitral regurgitation   . Dyslipidemia   . Cerebrovascular disease     extracranial  . Osteoporosis   . Chronic systolic dysfunction of left ventricle   . Fractured shoulder 2009    Right shoulder Fx. from a fall  . Acute on chronic systolic heart failure     secondary to complete heart block  . Automatic implantable cardioverter-defibrillator in situ     placed in 2006; removed on 07/29/2013 (07/29/2013)  . Pacemaker 07/29/2013    Medtronic Adapta L DR implanted with new RA and RV leads inserted  . Exertional shortness of breath   . Stroke 07/2005    "when they took me off heart/lung machine; affected my left foot" (07/29/2013)  . Arthritis     "left wrist; back" (07/29/2013)  . Anxiety     "when I come to the hospital; don't take any RX" (07/29/2013)  . Chronic kidney disease, unspecified   . Overactive bladder   . Coronary artery disease     s/p CABG  . Type II diabetes mellitus   . Hypertension   . Hyperlipidemia   . Carotid stenosis     occluded right intracranial ICA followed by Dr. Donnetta Hutching  . Dysrhythmia   . Paroxysmal atrial fibrillation   .  Complete heart block     s/p upgrade to duale chamber PPM  . Severe mitral regurgitation     s/p MV ring  . Chronic diastolic CHF (congestive heart failure)   . Cancer Sept. 2015    Left Hand  -  Skin Cancer    Past Surgical History  Procedure Laterality Date  . Cardiac defibrillator placement  11/2005    by Dr Leonia Reeves (MDT) she has a [phone] fidelis lead  . Cataract extraction w/ intraocular lens  implant, bilateral Bilateral 1980's  . Mitral valve replacement  07/2005  . Coronary artery bypass graft  07/2005    "CABG X3" (07/29/2013)  . Salpingoophorectomy Bilateral 1976  . Total hip arthroplasty Right 2001  . Cardiac catheterization  07/2005  . Cardiac valve replacement  07/2005    "mitral valve" (07/29/2013)  . Appendectomy  1949  . Shoulder arthroscopy w/ rotator cuff repair Right 1998  . Pacemaker placement  07/29/2013    upgrade of single chamber ICD to Medtronic Adapta L DR implanted with new RA and RV leads inserted  . Joint replacement Right 2006    Hip  . Abdominal hysterectomy  ? 1974  . Permanent pacemaker insertion N/A 07/29/2013    Procedure: PERMANENT PACEMAKER INSERTION;  Surgeon: Thompson Grayer, MD;  [facility];  Service: Cardiovascular;  Laterality: N/A;     Current Outpatient Prescriptions  Medication Sig Dispense Refill  . acetaminophen (TYLENOL) 500 MG tablet Take 500 mg by mouth daily as needed (pain).     Marland Kitchen amLODipine (NORVASC) 10 MG tablet TAKE 1 TABLET BY MOUTH ONCE A DAY 30 tablet 6  . aspirin EC 81 MG tablet Take 81 mg by mouth every morning.    . carvedilol (COREG) 12.5 MG tablet TAKE 1/2 TABLET BY MOUTH TWICE DAILY 15 tablet 3  . Cholecalciferol (VITAMIN D) 2000 UNITS tablet Take 2,000 Units by mouth daily.     . fish oil-omega-3 fatty acids 1000 MG capsule Take 1 g by mouth 2 (two) times daily.     . furosemide (LASIX) 40 MG tablet Take 20 mg by mouth daily.     Marland Kitchen glipiZIDE (GLUCOTROL XL) 10 MG 24 hr tablet Take 20 mg by mouth daily.    .  hydrALAZINE (APRESOLINE) 25 MG tablet 1 TABLET TWICE A DAY ORALLY (Patient taking differently: 1 TABLET BY MOUTH TWICE A DAY ORALLY) 60 tablet 3  . meclizine (ANTIVERT) 25 MG tablet Take 25 mg by mouth daily as needed for dizziness.     . Multiple Vitamins-Minerals (CENTRUM SILVER PO) Take 1 tablet by mouth daily.     Marland Kitchen oxybutynin (DITROPAN) 5 MG tablet Take 1 tablet by mouth 2 (two) times daily.  3  . ramipril (ALTACE) 10 MG capsule TAKE 2 CAPSULES BY MOUTH ONCE DAILY 60 capsule 6  . simvastatin (ZOCOR) 20 MG tablet Take 20 mg by mouth every evening.  11   No current facility-administered medications for this visit.    Allergies:   Fosamax; Horse-derived products; Alendronate sodium; Clonidine derivatives; Darvocet; Sulfa drugs cross reactors; Tramadol; Vicodin; Penicillins; and Septra    Social History:  The patient  reports that she has never smoked. She has never used smokeless tobacco. She reports that she does not drink alcohol or use illicit drugs.   Family History:  The patient's family history includes Cancer in her mother and sister; Coronary artery disease in an other family member; Diabetes in her daughter, sister, and another family member; Heart attack in her sister; Heart disease in her mother and sister; Hyperlipidemia in her sister; Hypertension in an other family member.    ROS:  Please see the history of present illness.   Otherwise, review of systems are positive for none.   All other systems are reviewed and negative.    PHYSICAL EXAM: VS:  BP 140/62 mmHg  Pulse 64  Ht 5\' 2"  (1.575 m)  Wt 146 lb 1.9 oz (66.28 kg)  BMI 26.72 kg/m2  SpO2 96% , BMI Body mass index is 26.72 kg/(m^2). GEN: Well nourished, well developed, in no acute distress HEENT: normal Neck: no JVD, carotid bruits, or masses Cardiac: RRR; no murmurs, rubs, or gallops,no edema  Respiratory:  clear to auscultation bilaterally, normal work of breathing GI: soft, nontender, nondistended, + BS MS: no  deformity or atrophy Skin: warm and dry, no rash Neuro:  Strength and sensation are intact Psych: euthymic mood, full affect   EKG:  EKG was ordered today and showed atrial paced rhythm with nonspecific IVCD and T wave abnormality in inferolateral leads    Recent Labs: 11/08/2014: ALT 15 11/22/2014: BUN 16; Creatinine 1.1; Potassium 4.3; Sodium 140    Lipid Panel    Component Value Date/Time   CHOL 133 11/08/2014  0959   TRIG 157.0* 11/08/2014 0959   HDL 33.60* 11/08/2014 0959   CHOLHDL 4 11/08/2014 0959   VLDL 31.4 11/08/2014 0959   LDLCALC 68 11/08/2014 0959      Wt Readings from Last 3 Encounters:  05/12/15 146 lb 1.9 oz (66.28 kg)  11/08/14 149 lb (67.586 kg)  11/02/14 150 lb (68.04 kg)     ASSESSMENT AND PLAN:  1. ASCAD s/p CABG with no angina - continue ASA  2. HTN - well controlled - continue amlodipine/Coreg/Hydralazine/Ramipril  3. Dyslipidemia  - continue Zocor/fish oil  - recheck fasting lipids and ALT 4. MR s/p MV repair 5. Chronic diastolic CHF - well compensated - continue ramipril/Coreg and Lasix  - check BMET 6. CHB s/p PPM 7. Ischemic DCM - resolved EF 55%     Current medicines are reviewed at length with the patient today.  The patient does not have concerns regarding medicines.  The following changes have been made:  no change  Labs/ tests ordered today include: see above assessment and plan No orders of the defined types were placed in this encounter.     Disposition:   FU with me in 6 months   Signed, Sueanne Margarita, MD  05/12/2015 8:52 AM    [facility] [place], [place], [place]  [address] Phone: [phone]; Fax: [phone]

## 2015-05-12 NOTE — Patient Instructions (Signed)
Medication Instructions:  Your physician recommends that you continue on your current medications as directed. Please refer to the Current Medication list given to you today.   Labwork: TODAY: BMET, LFTs, Lipids  Testing/Procedures: None  Follow-Up: Your physician wants you to follow-up in: 6 months with Dr. Radford Pax. You will receive a reminder letter in the mail two months in advance. If you don't receive a letter, please call our office to schedule the follow-up appointment.   Any Other Special Instructions Will Be Listed Below (If Applicable).

## 2015-05-13 ENCOUNTER — Telehealth: Payer: Self-pay

## 2015-05-13 DIAGNOSIS — E785 Hyperlipidemia, unspecified: Secondary | ICD-10-CM

## 2015-05-13 MED ORDER — ATORVASTATIN CALCIUM 40 MG PO TABS: 40.0000 mg | ORAL_TABLET | Freq: Every day | ORAL | Status: DC

## 2015-05-13 NOTE — Telephone Encounter (Signed)
Informed patient of results and verbal understanding expressed.  Instructed patient to STOP SIMVASTATIN. Instructed patient to START LIPITOR 40 mg daily.  FLP and ALT scheduled July 1. Patient agrees with treatment plan.

## 2015-05-13 NOTE — Telephone Encounter (Signed)
-----   Message from Sueanne Margarita, MD sent at 05/12/2015  8:44 PM EDT ----- LDL not at goal.  Cannot increase simvastatin due to Amlodipine.  Please change simvastatin to Lipitor 40mg  daily and recheck FLP and ALT in 6 weeks

## 2015-05-31 ENCOUNTER — Telehealth: Payer: Self-pay | Admitting: Internal Medicine

## 2015-05-31 NOTE — Telephone Encounter (Signed)
Informed pt that she can come in today and receive help hooking up monitor.

## 2015-05-31 NOTE — Telephone Encounter (Signed)
New Message       Pt calling stating that she was told that she could come into our office when she received her new adapter and monitor and someone would show her how to set it up. Pt wants to know if she can come in today. Please call back and advise.

## 2015-06-02 ENCOUNTER — Other Ambulatory Visit: Payer: Self-pay | Admitting: Cardiology

## 2015-06-06 ENCOUNTER — Ambulatory Visit (INDEPENDENT_AMBULATORY_CARE_PROVIDER_SITE_OTHER): Payer: Commercial Managed Care - HMO | Admitting: *Deleted

## 2015-06-06 ENCOUNTER — Other Ambulatory Visit: Payer: Self-pay | Admitting: Cardiology

## 2015-06-06 DIAGNOSIS — I442 Atrioventricular block, complete: Secondary | ICD-10-CM

## 2015-06-07 NOTE — Progress Notes (Signed)
Remote pacemaker transmission.   

## 2015-06-14 LAB — CUP PACEART REMOTE DEVICE CHECK
Battery Remaining Longevity: 162 mo
Battery Voltage: 2.78 V
Brady Statistic AP VS Percent: 85 %
Brady Statistic AS VP Percent: 0 %
Date Time Interrogation Session: 20160606142210
Lead Channel Impedance Value: 446 Ohm
Lead Channel Pacing Threshold Amplitude: 0.625 V
Lead Channel Pacing Threshold Amplitude: 0.625 V
Lead Channel Pacing Threshold Pulse Width: 0.4 ms
Lead Channel Pacing Threshold Pulse Width: 0.4 ms
Lead Channel Sensing Intrinsic Amplitude: 5.6 mV
Lead Channel Setting Pacing Pulse Width: 0.4 ms
Lead Channel Setting Sensing Sensitivity: 4 mV
MDC IDC MSMT BATTERY IMPEDANCE: 100 Ohm
MDC IDC MSMT LEADCHNL RA IMPEDANCE VALUE: 461 Ohm
MDC IDC SET LEADCHNL RA PACING AMPLITUDE: 1.5 V
MDC IDC SET LEADCHNL RV PACING AMPLITUDE: 2 V
MDC IDC STAT BRADY AP VP PERCENT: 0 %
MDC IDC STAT BRADY AS VS PERCENT: 15 %

## 2015-06-20 ENCOUNTER — Encounter: Payer: Self-pay | Admitting: Cardiology

## 2015-06-24 ENCOUNTER — Encounter: Payer: Self-pay | Admitting: Internal Medicine

## 2015-07-01 ENCOUNTER — Other Ambulatory Visit (INDEPENDENT_AMBULATORY_CARE_PROVIDER_SITE_OTHER): Payer: Commercial Managed Care - HMO | Admitting: *Deleted

## 2015-07-01 DIAGNOSIS — E785 Hyperlipidemia, unspecified: Secondary | ICD-10-CM | POA: Diagnosis not present

## 2015-07-01 LAB — LIPID PANEL
Cholesterol: 144 mg/dL (ref 0–200)
HDL: 43.5 mg/dL
LDL Cholesterol: 81 mg/dL (ref 0–99)
NonHDL: 100.5
Total CHOL/HDL Ratio: 3
Triglycerides: 99 mg/dL (ref 0.0–149.0)
VLDL: 19.8 mg/dL (ref 0.0–40.0)

## 2015-07-01 LAB — ALT: ALT: 13 U/L (ref 0–35)

## 2015-07-12 ENCOUNTER — Telehealth: Payer: Self-pay | Admitting: *Deleted

## 2015-07-12 DIAGNOSIS — E785 Hyperlipidemia, unspecified: Secondary | ICD-10-CM

## 2015-07-12 MED ORDER — ATORVASTATIN CALCIUM 80 MG PO TABS: 80.0000 mg | ORAL_TABLET | Freq: Every day | ORAL | Status: DC

## 2015-07-12 NOTE — Telephone Encounter (Signed)
Spoke with pt and informed her of lab results. Informed pt to increase Lipitor to 80mg  and recheck labs in 6 weeks. Scheduled labs for 8/23. Pt verbalized understanding and was in agreement with this plan.

## 2015-07-12 NOTE — Telephone Encounter (Signed)
-----   Message from Sueanne Margarita, MD sent at 07/10/2015  5:44 PM EDT ----- LDL not at goal.  Increase Lipitor to 80mg  daily and recheck FLP and ALT in 6 weeks

## 2015-07-18 ENCOUNTER — Encounter: Payer: Self-pay | Admitting: Cardiology

## 2015-07-21 ENCOUNTER — Telehealth: Payer: Self-pay | Admitting: Cardiology

## 2015-07-21 NOTE — Telephone Encounter (Signed)
Pt c/o medication issue:  1. Name of Medication: Lipitor  2. How are you currently taking this medication (dosage and times per day)? 80 mg 1x per day (Pt stopped taking it on 06/14/15)  3. Are you having a reaction (difficulty breathing--STAT)? Swelling in feet and legs  4. What is your medication issue?

## 2015-07-21 NOTE — Telephone Encounter (Signed)
Pt c/o swelling in ankles and feet. She blames her Lipitor. A couple weeks ago, she was instructed to increase her Lipitor from 40 mg to 80 mg daily. She took the increased dose one time and her "legs blew up like balloons." She reports her last day taking Lipitor at all was 7/14. Since then, her leg swelling has improved significantly but is still noticeable, RLE > LLE. They are also achy, especially at night. Pt st she stopped taking Lipitor in the past because it caused swelling as well, but she st that as soon as she quit the Lipitor the swelling resolved.  Patient complains of no other symptoms. No SOB, no CP.  Patient has no VS to report.  To Dr. Radford Pax for recommendations.

## 2015-07-22 NOTE — Telephone Encounter (Signed)
Please find out if she has ever tried Crestor

## 2015-07-25 ENCOUNTER — Telehealth: Payer: Self-pay | Admitting: Cardiology

## 2015-07-25 MED ORDER — HYDRALAZINE HCL 25 MG PO TABS: 37.5000 mg | ORAL_TABLET | Freq: Three times a day (TID) | ORAL | Status: DC

## 2015-07-25 MED ORDER — SIMVASTATIN 40 MG PO TABS: 40.0000 mg | ORAL_TABLET | Freq: Every day | ORAL | Status: DC

## 2015-07-25 NOTE — Telephone Encounter (Signed)
Informed patient she can take Zocor 20 mg 2 tablets until she gets her new Rx which will be Zocor 40 mg. Patient grateful for callback.

## 2015-07-25 NOTE — Telephone Encounter (Signed)
Instructed patient to STOP AMLODIPINE and to INCREASE HYDRALAZINE to 37.5 mg TID. Instructed patient to STOP LIPITOR and START ZOCOR 40 mg daily. Instructed patient to check BP daily for a week and call with results. Patient agrees with treatment plan.

## 2015-07-25 NOTE — Telephone Encounter (Signed)
New message     Pt found prescription for zocor Pt wants to know if she can take 2 of the 20mg  pills or does she need to take the 40mg  pill instead  Please call to discuss
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
## 2015-07-25 NOTE — Telephone Encounter (Signed)
Please have patient stop amlodipine.  Increase Hydralazine to 37.5mg  TID and increase Zocor to 40mg  daily.  Have her check her BP daily for a week and call with results.  Check FLP and ALT in 6 weeks.

## 2015-07-25 NOTE — Telephone Encounter (Signed)
Patient refused to try Crestor - she states she will not pay for it. She said she has been on Zocor and does not remember having any problems with that, but her Zocor was stopped in May due to her taking amlodipine.   To Dr. Radford Pax.

## 2015-08-23 ENCOUNTER — Other Ambulatory Visit: Payer: Commercial Managed Care - HMO

## 2015-09-07 ENCOUNTER — Ambulatory Visit (INDEPENDENT_AMBULATORY_CARE_PROVIDER_SITE_OTHER): Payer: Commercial Managed Care - HMO | Admitting: Internal Medicine

## 2015-09-07 ENCOUNTER — Other Ambulatory Visit (INDEPENDENT_AMBULATORY_CARE_PROVIDER_SITE_OTHER): Payer: Commercial Managed Care - HMO | Admitting: *Deleted

## 2015-09-07 ENCOUNTER — Encounter: Payer: Self-pay | Admitting: Internal Medicine

## 2015-09-07 VITALS — BP 142/78 | HR 67 | Ht 62.0 in | Wt 147.6 lb

## 2015-09-07 DIAGNOSIS — I10 Essential (primary) hypertension: Secondary | ICD-10-CM | POA: Diagnosis not present

## 2015-09-07 DIAGNOSIS — I442 Atrioventricular block, complete: Secondary | ICD-10-CM

## 2015-09-07 DIAGNOSIS — E785 Hyperlipidemia, unspecified: Secondary | ICD-10-CM | POA: Diagnosis not present

## 2015-09-07 LAB — CUP PACEART INCLINIC DEVICE CHECK
Battery Impedance: 111 Ohm
Battery Remaining Longevity: 157 mo
Battery Voltage: 2.78 V
Brady Statistic AP VP Percent: 0 %
Brady Statistic AS VP Percent: 0 %
Brady Statistic AS VS Percent: 17 %
Lead Channel Impedance Value: 401 Ohm
Lead Channel Pacing Threshold Pulse Width: 0.4 ms
Lead Channel Pacing Threshold Pulse Width: 0.4 ms
Lead Channel Sensing Intrinsic Amplitude: 2 mV
Lead Channel Setting Pacing Amplitude: 2.5 V
Lead Channel Setting Sensing Sensitivity: 4 mV
MDC IDC MSMT LEADCHNL RA PACING THRESHOLD AMPLITUDE: 0.5 V
MDC IDC MSMT LEADCHNL RV IMPEDANCE VALUE: 433 Ohm
MDC IDC MSMT LEADCHNL RV PACING THRESHOLD AMPLITUDE: 0.75 V
MDC IDC MSMT LEADCHNL RV SENSING INTR AMPL: 11.2 mV
MDC IDC SESS DTM: 20160907130522
MDC IDC SET LEADCHNL RA PACING AMPLITUDE: 2 V
MDC IDC SET LEADCHNL RV PACING PULSEWIDTH: 0.4 ms
MDC IDC STAT BRADY AP VS PERCENT: 83 %

## 2015-09-07 LAB — LIPID PANEL
Cholesterol: 134 mg/dL (ref 0–200)
HDL: 45.3 mg/dL (ref >39.00)
LDL CALC: 68 mg/dL (ref 0–99)
NonHDL: 88.58
TRIGLYCERIDES: 103 mg/dL (ref 0.0–149.0)
Total CHOL/HDL Ratio: 3
VLDL: 20.6 mg/dL (ref 0.0–40.0)

## 2015-09-07 LAB — ALT: ALT: 11 U/L (ref 0–35)

## 2015-09-07 NOTE — Progress Notes (Signed)
Electrophysiology Office Note   Date:  09/07/2015   ID:  [name], DOB 1928-11-23, MRN [id number]  PCP:  Mayra Neer, MD  Cardiologist:  Dr Radford Pax Primary Electrophysiologist: Thompson Grayer, MD    Chief Complaint  Patient presents with  . PAF  . CHB     History of Present Illness: [name] is a 81 y.o. female who presents today for electrophysiology evaluation.   Doing well at this time.  She has occasional postural dizziness.    Today, she denies symptoms of palpitations, chest pain, shortness of breath, orthopnea, PND, lower extremity edema, claudication, presyncope, syncope, bleeding, or neurologic sequela. The patient is tolerating medications without difficulties and is otherwise without complaint today.    Past Medical History  Diagnosis Date  . Ischemic cardiomyopathy     s/p ICD (MDT) by Dr Leonia Reeves 2006, EF 24% at the time but now 55%  . Mitral regurgitation   . Dyslipidemia   . Cerebrovascular disease     extracranial  . Osteoporosis   . Chronic systolic dysfunction of left ventricle   . Fractured shoulder 2009    Right shoulder Fx. from a fall  . Acute on chronic systolic heart failure     secondary to complete heart block  . Automatic implantable cardioverter-defibrillator in situ     placed in 2006; removed on 07/29/2013 (07/29/2013)  . Pacemaker 07/29/2013    Medtronic Adapta L DR implanted with new RA and RV leads inserted  . Exertional shortness of breath   . Stroke 07/2005    "when they took me off heart/lung machine; affected my left foot" (07/29/2013)  . Arthritis     "left wrist; back" (07/29/2013)  . Anxiety     "when I come to the hospital; don't take any RX" (07/29/2013)  . Chronic kidney disease, unspecified   . Overactive bladder   . Coronary artery disease     s/p CABG  . Type II diabetes mellitus   . Hypertension   . Hyperlipidemia   . Carotid stenosis     occluded right intracranial ICA followed by Dr. Donnetta Hutching  . Dysrhythmia     . Paroxysmal atrial fibrillation   . Complete heart block     s/p upgrade to duale chamber PPM  . Severe mitral regurgitation     s/p MV ring  . Chronic diastolic CHF (congestive heart failure)   . Cancer Sept. 2015    Left Hand  -  Skin Cancer   Past Surgical History  Procedure Laterality Date  . Cardiac defibrillator placement  11/2005    by Dr Leonia Reeves (MDT) she has a [phone] fidelis lead  . Cataract extraction w/ intraocular lens  implant, bilateral Bilateral 1980's  . Mitral valve replacement  07/2005  . Coronary artery bypass graft  07/2005    "CABG X3" (07/29/2013)  . Salpingoophorectomy Bilateral 1976  . Total hip arthroplasty Right 2001  . Cardiac catheterization  07/2005  . Cardiac valve replacement  07/2005    "mitral valve" (07/29/2013)  . Appendectomy  1949  . Shoulder arthroscopy w/ rotator cuff repair Right 1998  . Pacemaker placement  07/29/2013    upgrade of single chamber ICD to Medtronic Adapta L DR implanted with new RA and RV leads inserted  . Joint replacement Right 2006    Hip  . Abdominal hysterectomy  ? 1974  . Permanent pacemaker insertion N/A 07/29/2013    Procedure: PERMANENT PACEMAKER INSERTION;  Surgeon: Thompson Grayer, MD;  Location:  [facility] CATH LAB;  Service: Cardiovascular;  Laterality: N/A;     Current Outpatient Prescriptions  Medication Sig Dispense Refill  . acetaminophen (TYLENOL) 500 MG tablet Take 500 mg by mouth daily as needed (pain).     Marland Kitchen amLODipine (NORVASC) 10 MG tablet Take 10 mg by mouth daily.  6  . aspirin EC 81 MG tablet Take 81 mg by mouth every morning.    Marland Kitchen atorvastatin (LIPITOR) 80 MG tablet Take 1 tablet (80 mg total) by mouth daily. 90 tablet 2  . carvedilol (COREG) 12.5 MG tablet TAKE 1/2 TABLET BY MOUTH TWICE DAILY 30 tablet 5  . Cholecalciferol (VITAMIN D) 2000 UNITS tablet Take 2,000 Units by mouth daily.     . fish oil-omega-3 fatty acids 1000 MG capsule Take 1 g by mouth 2 (two) times daily.     . furosemide (LASIX) 40 MG  tablet Take 20 mg by mouth daily.     Marland Kitchen glipiZIDE (GLUCOTROL XL) 10 MG 24 hr tablet Take 20 mg by mouth daily.    . hydrALAZINE (APRESOLINE) 25 MG tablet Take 1.5 tablets (37.5 mg total) by mouth 3 (three) times daily. 135 tablet 11  . meclizine (ANTIVERT) 25 MG tablet Take 25 mg by mouth daily as needed for dizziness.     . Multiple Vitamins-Minerals (CENTRUM SILVER PO) Take 1 tablet by mouth daily.     Marland Kitchen oxybutynin (DITROPAN) 5 MG tablet Take 1 tablet by mouth 2 (two) times daily.  3  . ramipril (ALTACE) 10 MG capsule TAKE 2 CAPSULES BY MOUTH ONCE DAILY 60 capsule 5  . simvastatin (ZOCOR) 40 MG tablet Take 1 tablet (40 mg total) by mouth at bedtime. 30 tablet 11   No current facility-administered medications for this visit.    Allergies:   Fosamax; Horse-derived products; Alendronate sodium; Clonidine derivatives; Darvocet; Sulfa drugs cross reactors; Tramadol; Vicodin; Penicillins; and Septra   Social History:  The patient  reports that she has never smoked. She has never used smokeless tobacco. She reports that she does not drink alcohol or use illicit drugs.   Family History:  The patient's family history includes Cancer in her mother and sister; Coronary artery disease in an other family member; Diabetes in her daughter, sister, and another family member; Heart attack in her sister; Heart disease in her mother and sister; Hyperlipidemia in her sister; Hypertension in an other family member.    ROS:  Please see the history of present illness.   All other systems are reviewed and negative.    PHYSICAL EXAM: VS:  BP 142/78 mmHg  Pulse 67  Ht 5\' 2"  (1.575 m)  Wt 66.951 kg (147 lb 9.6 oz)  BMI 26.99 kg/m2 , BMI Body mass index is 26.99 kg/(m^2). GEN: Well nourished, well developed, in no acute distress HEENT: normal Neck: no JVD, carotid bruits, or masses Cardiac: RRR; no murmurs, rubs, or gallops,no edema  Respiratory:  clear to auscultation bilaterally, normal work of  breathing GI: soft, nontender, nondistended, + BS MS: no deformity or atrophy Skin: warm and dry, device pocket is well healed Neuro:  Strength and sensation are intact Psych: euthymic mood, full affect  Device interrogation is reviewed today in detail.  See PaceArt for details.   Recent Labs: 05/12/2015: BUN 23; Creatinine, Ser 1.14; Potassium 4.3; Sodium 141 07/01/2015: ALT 13    Lipid Panel     Component Value Date/Time   CHOL 144 07/01/2015 0829   TRIG 99.0 07/01/2015 0829   HDL 43.50  07/01/2015 0829   CHOLHDL 3 07/01/2015 0829   VLDL 19.8 07/01/2015 0829   LDLCALC 81 07/01/2015 0829     Wt Readings from Last 3 Encounters:  09/07/15 66.951 kg (147 lb 9.6 oz)  05/12/15 66.28 kg (146 lb 1.9 oz)  11/08/14 67.586 kg (149 lb)     ASSESSMENT AND PLAN:  1.  Transient complete heart block Normal pacemaker function See Pace Art report No changes today  2. HTN Stable No change required today  3. CAD/ chronic systolic dysfunction No ischemic symptoms EF has recovered Salt restriction  4. Paroxysmal atrial fibrillation Very rare, avoiding anticoagulation due to falls and advanced age Will reconsider if AF burden increases.  Follow-up: carelink, return to see EP NP in 1 year  Current medicines are reviewed at length with the patient today.   The patient does not have concerns regarding her medicines.  The following changes were made today:  none   Signed, Thompson Grayer, MD  09/07/2015 10:34 AM     [facility] [address] [place] [address] [phone] (office) [phone] (fax)

## 2015-09-07 NOTE — Patient Instructions (Signed)
Remote monitoring is used to monitor your pacemaker from home. This monitoring reduces the number of office visits required to check your device to one time per year. It allows [place] to keep an eye on the functioning of your device to ensure it is working properly. You are scheduled for a device check from home on 12-07-2015. You may send your transmission at any time that day. If you have a wireless device, the transmission will be sent automatically. After your physician reviews your transmission, you will receive a postcard with your next transmission date.  Your physician recommends that you schedule a follow-up appointment in: 12 months with Dr.Allred

## 2015-09-19 ENCOUNTER — Encounter: Payer: Self-pay | Admitting: Internal Medicine

## 2015-09-26 ENCOUNTER — Other Ambulatory Visit: Payer: Self-pay

## 2015-09-26 DIAGNOSIS — Z1231 Encounter for screening mammogram for malignant neoplasm of breast: Secondary | ICD-10-CM

## 2015-09-27 ENCOUNTER — Other Ambulatory Visit (HOSPITAL_COMMUNITY): Payer: Self-pay | Admitting: Physician Assistant

## 2015-09-27 DIAGNOSIS — R1310 Dysphagia, unspecified: Secondary | ICD-10-CM

## 2015-10-03 ENCOUNTER — Other Ambulatory Visit (HOSPITAL_COMMUNITY): Payer: Self-pay | Admitting: Physician Assistant

## 2015-10-03 DIAGNOSIS — R1310 Dysphagia, unspecified: Secondary | ICD-10-CM

## 2015-10-05 ENCOUNTER — Ambulatory Visit (HOSPITAL_COMMUNITY)
Admission: RE | Admit: 2015-10-05 | Discharge: 2015-10-05 | Disposition: A | Discharge status: Home / Self Care | Payer: Commercial Managed Care - HMO | Source: Ambulatory Visit | Attending: Physician Assistant | Admitting: Physician Assistant

## 2015-10-05 DIAGNOSIS — K224 Dyskinesia of esophagus: Secondary | ICD-10-CM | POA: Diagnosis not present

## 2015-10-05 DIAGNOSIS — R1310 Dysphagia, unspecified: Secondary | ICD-10-CM | POA: Diagnosis present

## 2015-10-05 DIAGNOSIS — R1313 Dysphagia, pharyngeal phase: Secondary | ICD-10-CM | POA: Insufficient documentation

## 2015-10-05 NOTE — Evaluation (Addendum)
   Speech Pathology  MBSS complete. Full report located under chart review in imaging section. Double click on imaging section. Recommend: regular/thin liquids.      Orbie Pyo [place].Ed [employer] [phone]

## 2015-10-13 ENCOUNTER — Encounter: Payer: Self-pay | Admitting: Internal Medicine

## 2015-10-20 ENCOUNTER — Ambulatory Visit
Admission: RE | Admit: 2015-10-20 | Discharge: 2015-10-20 | Disposition: A | Discharge status: Home / Self Care | Payer: Commercial Managed Care - HMO | Source: Ambulatory Visit

## 2015-10-20 DIAGNOSIS — Z1231 Encounter for screening mammogram for malignant neoplasm of breast: Secondary | ICD-10-CM

## 2015-11-01 ENCOUNTER — Encounter: Payer: Self-pay | Admitting: Family

## 2015-11-03 ENCOUNTER — Ambulatory Visit (HOSPITAL_COMMUNITY)
Admission: RE | Admit: 2015-11-03 | Discharge: 2015-11-03 | Disposition: A | Discharge status: Home / Self Care | Payer: Commercial Managed Care - HMO | Source: Ambulatory Visit | Attending: Vascular Surgery | Admitting: Vascular Surgery

## 2015-11-03 ENCOUNTER — Ambulatory Visit (INDEPENDENT_AMBULATORY_CARE_PROVIDER_SITE_OTHER): Payer: Commercial Managed Care - HMO | Admitting: Family

## 2015-11-03 ENCOUNTER — Encounter: Payer: Self-pay | Admitting: Family

## 2015-11-03 VITALS — BP 162/82 | HR 68 | Temp 98.4°F | Resp 16 | Ht 62.0 in | Wt 140.0 lb

## 2015-11-03 DIAGNOSIS — E785 Hyperlipidemia, unspecified: Secondary | ICD-10-CM | POA: Diagnosis not present

## 2015-11-03 DIAGNOSIS — E119 Type 2 diabetes mellitus without complications: Secondary | ICD-10-CM | POA: Diagnosis not present

## 2015-11-03 DIAGNOSIS — I10 Essential (primary) hypertension: Secondary | ICD-10-CM | POA: Insufficient documentation

## 2015-11-03 DIAGNOSIS — I6523 Occlusion and stenosis of bilateral carotid arteries: Secondary | ICD-10-CM | POA: Diagnosis not present

## 2015-11-03 NOTE — Patient Instructions (Signed)
Stroke Prevention Some medical conditions and behaviors are associated with an increased chance of having a stroke. You may prevent a stroke by making healthy choices and managing medical conditions. HOW CAN I REDUCE MY RISK OF HAVING A STROKE?   Stay physically active. Get at least 30 minutes of activity on most or all days.  Do not smoke. It may also be helpful to avoid exposure to secondhand smoke.  Limit alcohol use. Moderate alcohol use is considered to be:  No more than 2 drinks per day for men.  No more than 1 drink per day for nonpregnant women.  Eat healthy foods. This involves:  Eating 5 or more servings of fruits and vegetables a day.  Making dietary changes that address high blood pressure (hypertension), high cholesterol, diabetes, or obesity.  Manage your cholesterol levels.  Making food choices that are high in fiber and low in saturated fat, trans fat, and cholesterol may control cholesterol levels.  Take any prescribed medicines to control cholesterol as directed by your health care provider.  Manage your diabetes.  Controlling your carbohydrate and sugar intake is recommended to manage diabetes.  Take any prescribed medicines to control diabetes as directed by your health care provider.  Control your hypertension.  Making food choices that are low in salt (sodium), saturated fat, trans fat, and cholesterol is recommended to manage hypertension.  Ask your health care provider if you need treatment to lower your blood pressure. Take any prescribed medicines to control hypertension as directed by your health care provider.  If you are 18-39 years of age, have your blood pressure checked every 3-5 years. If you are 40 years of age or older, have your blood pressure checked every year.  Maintain a healthy weight.  Reducing calorie intake and making food choices that are low in sodium, saturated fat, trans fat, and cholesterol are recommended to manage  weight.  Stop drug abuse.  Avoid taking birth control pills.  Talk to your health care provider about the risks of taking birth control pills if you are over 35 years old, smoke, get migraines, or have ever had a blood clot.  Get evaluated for sleep disorders (sleep apnea).  Talk to your health care provider about getting a sleep evaluation if you snore a lot or have excessive sleepiness.  Take medicines only as directed by your health care provider.  For some people, aspirin or blood thinners (anticoagulants) are helpful in reducing the risk of forming abnormal blood clots that can lead to stroke. If you have the irregular heart rhythm of atrial fibrillation, you should be on a blood thinner unless there is a good reason you cannot take them.  Understand all your medicine instructions.  Make sure that other conditions (such as anemia or atherosclerosis) are addressed. SEEK IMMEDIATE MEDICAL CARE IF:   You have sudden weakness or numbness of the face, arm, or leg, especially on one side of the body.  Your face or eyelid droops to one side.  You have sudden confusion.  You have trouble speaking (aphasia) or understanding.  You have sudden trouble seeing in one or both eyes.  You have sudden trouble walking.  You have dizziness.  You have a loss of balance or coordination.  You have a sudden, severe headache with no known cause.  You have new chest pain or an irregular heartbeat. Any of these symptoms may represent a serious problem that is an emergency. Do not wait to see if the symptoms will   go away. Get medical help at once. Call your local emergency services (911 in [place] Do not drive yourself to the hospital.   This information is not intended to replace advice given to you by your health care provider. Make sure you discuss any questions you have with your health care provider.   Document Released: 01/24/2005 Document Revised: 01/07/2015 Document Reviewed:  06/19/2013 Elsevier Interactive Patient Education 2016 Elsevier Inc.  

## 2015-11-03 NOTE — Progress Notes (Signed)
Established Carotid Patient   History of Present Illness  [name] is a 81 y.o. female patient of Dr. Donnetta Hutching who presents today for continued discussion of her extracranial cerebrovascular occlusive disease. We have followed her for number of years with suspected right internal carotid artery occlusion. She recently underwent a repeat duplex and this suggested potential reconstituted flow in her right internal carotid artery. She continues to have no significant stenosis of her left carotid.  It was recommended that she undergo a CT angiogram for further discussion. She does report dizziness and unsteady gait but no focal neurologic deficits.  Dr. Donnetta Hutching last saw pt on 11/02/14. At that time CT angiogram of her head and neck revealed patency of her right carotid bifurcation. She has a subtotal or total occlusion of the intracranial portion of her internal carotid artery making her extracranial internal carotid quite small. This was compared to MRA in 2006 showing no change. Left carotid system shows no stenosis Dr. Donnetta Hutching discussed with the pt that this is a stable situation and he would not recommend any other evaluation or treatment; recommended that we see her again with carotid duplex in one year. She returns today for yearly surveillance.  She reports a stroke that affected her left foot at the end of her CABG surgery in July 2014, none subsequently.   Pt states she has felt "wobbly" when walking for years, has not improved nor worsened; states her legs feel like they may give way; this has not improved with her pacemaker insertion.  Pt Diabetic: Yes,in control, July, 2016 A1C was 6.0 Pt smoker: non-smoker   Pt meds include:  Statin : Yes  Betablocker: Yes  ASA: Yes, 81 mg daily  Other anticoagulants/antiplatelets: no   Past Medical History  Diagnosis Date  . Ischemic cardiomyopathy     s/p ICD (MDT) by Dr Leonia Reeves 2006, EF 24% at the time but now 55%  . Mitral regurgitation    . Dyslipidemia   . Cerebrovascular disease     extracranial  . Osteoporosis   . Chronic systolic dysfunction of left ventricle   . Fractured shoulder 2009    Right shoulder Fx. from a fall  . Acute on chronic systolic heart failure (HCC)     secondary to complete heart block  . Automatic implantable cardioverter-defibrillator in situ     placed in 2006; removed on 07/29/2013 (07/29/2013)  . Pacemaker 07/29/2013    Medtronic Adapta L DR implanted with new RA and RV leads inserted  . Exertional shortness of breath   . Stroke [facility]) 07/2005    "when they took me off heart/lung machine; affected my left foot" (07/29/2013)  . Arthritis     "left wrist; back" (07/29/2013)  . Anxiety     "when I come to the hospital; don't take any RX" (07/29/2013)  . Chronic kidney disease, unspecified ([place])   . Overactive bladder   . Coronary artery disease     s/p CABG  . Type II diabetes mellitus ([place])   . Hypertension   . Hyperlipidemia   . Carotid stenosis     occluded right intracranial ICA followed by Dr. Donnetta Hutching  . Dysrhythmia   . Paroxysmal atrial fibrillation (HCC)   . Complete heart block (HCC)     s/p upgrade to duale chamber PPM  . Severe mitral regurgitation     s/p MV ring  . Chronic diastolic CHF (congestive heart failure) ([place])   . Cancer [facility]) Sept. 2015  Left Hand  -  Skin Cancer    Social History Social History  Substance Use Topics  . Smoking status: Never Smoker   . Smokeless tobacco: Never Used  . Alcohol Use: No    Family History Family History  Problem Relation Age of Onset  . Coronary artery disease      family hx of  . Hypertension      family hx of  . Diabetes      family hx of  . Cancer Mother     Theadora Rama   . Heart disease Mother     Before age 56  . Heart disease Sister     Before age 6-  CABG  . Cancer Sister     Lonia Chimera  . Diabetes Sister   . Hyperlipidemia Sister   . Heart attack Sister   . Diabetes Daughter     Surgical History Past  Surgical History  Procedure Laterality Date  . Cardiac defibrillator placement  11/2005    by Dr Leonia Reeves (MDT) she has a [phone] fidelis lead  . Cataract extraction w/ intraocular lens  implant, bilateral Bilateral 1980's  . Mitral valve replacement  07/2005  . Coronary artery bypass graft  07/2005    "CABG X3" (07/29/2013)  . Salpingoophorectomy Bilateral 1976  . Total hip arthroplasty Right 2001  . Cardiac catheterization  07/2005  . Cardiac valve replacement  07/2005    "mitral valve" (07/29/2013)  . Appendectomy  1949  . Shoulder arthroscopy w/ rotator cuff repair Right 1998  . Pacemaker placement  07/29/2013    upgrade of single chamber ICD to Medtronic Adapta L DR implanted with new RA and RV leads inserted  . Joint replacement Right 2006    Hip  . Abdominal hysterectomy  ? 1974  . Permanent pacemaker insertion N/A 07/29/2013    Procedure: PERMANENT PACEMAKER INSERTION;  Surgeon: Thompson Grayer, MD;  Location: [facility] CATH LAB;  Service: Cardiovascular;  Laterality: N/A;    Allergies  Allergen Reactions  . Fosamax [Alendronate] Anaphylaxis  . Horse-Derived Products Hives  . Alendronate Sodium Other (See Comments)    UNKNOWN. Pt does not recall being allergic  . Clonidine Derivatives Other (See Comments)    UNKNOWN. Pt does not recall allergy  . Darvocet [Propoxyphene N-Acetaminophen] Nausea And Vomiting  . Sulfa Drugs Cross Reactors Hives and Itching  . Tramadol Nausea And Vomiting  . Vicodin [Hydrocodone-Acetaminophen] Other (See Comments)    lightheaded  . Penicillins Rash  . Septra [Sulfamethoxazole-Trimethoprim] Rash    Current Outpatient Prescriptions  Medication Sig Dispense Refill  . acetaminophen (TYLENOL) 500 MG tablet Take 500 mg by mouth daily as needed (pain).     Marland Kitchen amLODipine (NORVASC) 10 MG tablet Take 10 mg by mouth daily.  6  . aspirin EC 81 MG tablet Take 81 mg by mouth every morning.    Marland Kitchen atorvastatin (LIPITOR) 80 MG tablet Take 1 tablet (80 mg total) by mouth  daily. (Patient not taking: Reported on 11/03/2015) 90 tablet 2  . carvedilol (COREG) 12.5 MG tablet TAKE 1/2 TABLET BY MOUTH TWICE DAILY 30 tablet 5  . Cholecalciferol (VITAMIN D) 2000 UNITS tablet Take 2,000 Units by mouth daily.     . fish oil-omega-3 fatty acids 1000 MG capsule Take 1 g by mouth 2 (two) times daily.     . furosemide (LASIX) 40 MG tablet Take 20 mg by mouth daily.     Marland Kitchen glipiZIDE (GLUCOTROL XL) 10 MG 24 hr tablet Take 20 mg  by mouth daily.    . hydrALAZINE (APRESOLINE) 25 MG tablet Take 1.5 tablets (37.5 mg total) by mouth 3 (three) times daily. 135 tablet 11  . meclizine (ANTIVERT) 25 MG tablet Take 25 mg by mouth daily as needed for dizziness.     . Multiple Vitamins-Minerals (CENTRUM SILVER PO) Take 1 tablet by mouth daily.     Marland Kitchen oxybutynin (DITROPAN) 5 MG tablet Take 1 tablet by mouth 2 (two) times daily.  3  . ramipril (ALTACE) 10 MG capsule TAKE 2 CAPSULES BY MOUTH ONCE DAILY (Patient not taking: Reported on 11/03/2015) 60 capsule 5  . simvastatin (ZOCOR) 40 MG tablet Take 1 tablet (40 mg total) by mouth at bedtime. 30 tablet 11   No current facility-administered medications for this visit.    Review of Systems : See HPI for pertinent positives and negatives.  Physical Examination  Filed Vitals:   11/03/15 1439 11/03/15 1440 11/03/15 1441  BP: 149/76 154/79 162/82  Pulse: 69 68 68  Temp: 98.4 F (36.9 C)    Resp: 16    Height: 5\' 2"  (1.575 m)    Weight: 140 lb (63.504 kg)    SpO2: 98%     Body mass index is 25.6 kg/(m^2).  General: WDWN female in NAD  GAIT: slow and deliberate.  Eyes: PERRLA  Pulmonary: CTAB, no rales, no rhonchi, & no wheezing.  Cardiac: Regular Rhythm , positive murmur.   VASCULAR EXAM  Carotid Bruits  Left  Right    Negative  Negative   Aorta is not palpable.  Radial pulses are 2+ palpable and equal.   LE Pulses  LEFT  RIGHT   FEMORAL  not palpable  not palpable   POPLITEAL  not palpable  not palpable    POSTERIOR TIBIAL  not palpable  1+ palpable   DORSALIS PEDIS  ANTERIOR TIBIAL  Faintly palpable  not palpable    Gastrointestinal: soft, nontender, BS WNL, no r/g, no palpable masses.  Musculoskeletal: Negative muscle atrophy/wasting. M/S 4/5 in UE's, 3/5 in LE's, Extremities without ischemic changes.  Neurologic: A&O X 3; Appropriate Affect, normal sensation,  Speech is normal, repetitive lip licking, CN 8-84 intact, Pain and light touch intact in extremities, Motor exam as listed above.          Non-Invasive Vascular Imaging CAROTID DUPLEX 11/03/2015   Dampened resistive flow in the right carotid artery system which is consistent with known severe right cavernous carotid atherosclerotic disease from a CTA on 11/02/14. 1-39% stenosis of the bilateral proximal ICA's. No significant change compared to 10/25/14.   Assessment: [name] is a 81 y.o. female whom we have followed her for number of years with suspected right internal carotid artery occlusion. She recently underwent a repeat duplex and this suggested potential reconstituted flow in her right internal carotid artery.  She reports a stroke that affected her left foot at the end of her CABG surgery in July 2014, none subsequently.    Dr. Donnetta Hutching last saw pt on 11/02/14. At that time CT angiogram of her head and neck revealed patency of her right carotid bifurcation. She has a subtotal or total occlusion of the intracranial portion of her internal carotid artery making her extracranial internal carotid quite small. This was compared to MRA in 2006 showing no change. Left carotid system shows no stenosis Dr. Donnetta Hutching discussed with the pt that this is a stable situation and he would not recommend any other evaluation or treatment  Today's carotid duplex is  consistent with the results from the 11/02/14 CTA.  Plan: Follow-up in 1 year with Carotid Duplex scan.   I discussed in depth with the patient the nature of  atherosclerosis, and emphasized the importance of maximal medical management including strict control of blood pressure, blood glucose, and lipid levels, obtaining regular exercise, and continued cessation of smoking.  The patient is aware that without maximal medical management the underlying atherosclerotic disease process will progress, limiting the benefit of any interventions. The patient was given information about stroke prevention and what symptoms should prompt the patient to seek immediate medical care. Thank you for allowing [place] to participate in this patient's care.  Clemon Chambers, RN, MSN, FNP-C [facility]  Office: [phone]  Clinic Physician: Oneida Alar  11/03/2015 2:52 PM

## 2015-11-04 NOTE — Addendum Note (Signed)
Addended by: Dorthula Rue L on: 11/04/2015 09:21 AM   Modules accepted: Orders

## 2015-11-08 ENCOUNTER — Other Ambulatory Visit: Payer: Self-pay | Admitting: *Deleted

## 2015-11-08 NOTE — Progress Notes (Signed)
Cardiology Office Note   Date:  11/09/2015   ID:  [name], DOB 12/06/1928, MRN [id number]  PCP:  Mayra Neer, MD    Chief Complaint  Patient presents with  . Coronary Artery Disease  . Mitral Regurgitation      History of Present Illness: [name] is a 81 y.o. female who presents today for followup of her ischemic DCM, chronic diastolic CHF, ASCAD, dyslipidemia, MR s/p MV repair, and HTN. She is doing well. She denies any chest pain, LE edema, palpitations or syncope. She occasionally has some DOE if she is rushing but otherwise no problems with SOB. She occasionally will fell dizzy when going from sitting to standing or standing for a while.     Past Medical History  Diagnosis Date  . Ischemic cardiomyopathy     s/p ICD (MDT) by Dr Leonia Reeves 2006, EF 24% at the time but now 55%  . Mitral regurgitation   . Dyslipidemia   . Cerebrovascular disease     extracranial  . Osteoporosis   . Chronic systolic dysfunction of left ventricle   . Fractured shoulder 2009    Right shoulder Fx. from a fall  . Acute on chronic systolic heart failure (HCC)     secondary to complete heart block  . Automatic implantable cardioverter-defibrillator in situ     placed in 2006; removed on 07/29/2013 (07/29/2013)  . Pacemaker 07/29/2013    Medtronic Adapta L DR implanted with new RA and RV leads inserted  . Exertional shortness of breath   . Stroke [facility]) 07/2005    "when they took me off heart/lung machine; affected my left foot" (07/29/2013)  . Arthritis     "left wrist; back" (07/29/2013)  . Anxiety     "when I come to the hospital; don't take any RX" (07/29/2013)  . Chronic kidney disease, unspecified ([place])   . Overactive bladder   . Coronary artery disease     s/p CABG  . Type II diabetes mellitus ([place])   . Hypertension   . Hyperlipidemia   . Carotid stenosis     occluded right intracranial ICA followed by Dr. Donnetta Hutching  . Dysrhythmia   . Paroxysmal atrial  fibrillation (HCC)   . Complete heart block (HCC)     s/p upgrade to duale chamber PPM  . Severe mitral regurgitation     s/p MV ring  . Chronic diastolic CHF (congestive heart failure) ([place])   . Cancer [facility]) Sept. 2015    Left Hand  -  Skin Cancer    Past Surgical History  Procedure Laterality Date  . Cardiac defibrillator placement  11/2005    by Dr Leonia Reeves (MDT) she has a [phone] fidelis lead  . Cataract extraction w/ intraocular lens  implant, bilateral Bilateral 1980's  . Mitral valve replacement  07/2005  . Coronary artery bypass graft  07/2005    "CABG X3" (07/29/2013)  . Salpingoophorectomy Bilateral 1976  . Total hip arthroplasty Right 2001  . Cardiac catheterization  07/2005  . Cardiac valve replacement  07/2005    "mitral valve" (07/29/2013)  . Appendectomy  1949  . Shoulder arthroscopy w/ rotator cuff repair Right 1998  . Pacemaker placement  07/29/2013    upgrade of single chamber ICD to Medtronic Adapta L DR implanted with new RA and RV leads inserted  . Joint replacement Right 2006    Hip  . Abdominal  hysterectomy  ? 1974  . Permanent pacemaker insertion N/A 07/29/2013    Procedure: PERMANENT PACEMAKER INSERTION;  Surgeon: Thompson Grayer, MD;  Location: [facility] CATH LAB;  Service: Cardiovascular;  Laterality: N/A;     Current Outpatient Prescriptions  Medication Sig Dispense Refill  . acetaminophen (TYLENOL) 500 MG tablet Take 500 mg by mouth daily as needed (pain).     Marland Kitchen aspirin EC 81 MG tablet Take 81 mg by mouth every morning.    . carvedilol (COREG) 12.5 MG tablet TAKE 1/2 TABLET BY MOUTH TWICE DAILY 30 tablet 5  . Cholecalciferol (VITAMIN D) 2000 UNITS tablet Take 2,000 Units by mouth daily.     . fish oil-omega-3 fatty acids 1000 MG capsule Take 1 g by mouth 2 (two) times daily.     . furosemide (LASIX) 40 MG tablet Take 20 mg by mouth daily.     Marland Kitchen glipiZIDE (GLUCOTROL XL) 10 MG 24 hr tablet Take 20 mg by mouth daily.    . hydrALAZINE (APRESOLINE) 25 MG tablet Take 1.5  tablets (37.5 mg total) by mouth 3 (three) times daily. 135 tablet 11  . meclizine (ANTIVERT) 25 MG tablet Take 25 mg by mouth daily as needed for dizziness.     . Multiple Vitamins-Minerals (CENTRUM SILVER PO) Take 1 tablet by mouth daily.     Marland Kitchen oxybutynin (DITROPAN) 5 MG tablet Take 1 tablet by mouth 2 (two) times daily.  3  . simvastatin (ZOCOR) 40 MG tablet Take 1 tablet (40 mg total) by mouth at bedtime. 30 tablet 11   No current facility-administered medications for this visit.    Allergies:   Fosamax; Horse-derived products; Alendronate sodium; Clonidine derivatives; Darvocet; Sulfa drugs cross reactors; Tramadol; Vicodin; Penicillins; and Septra    Social History:  The patient  reports that she has never smoked. She has never used smokeless tobacco. She reports that she does not drink alcohol or use illicit drugs.   Family History:  The patient's family history includes Cancer in her mother and sister; Coronary artery disease in an other family member; Diabetes in her daughter, sister, and another family member; Heart attack in her sister; Heart disease in her mother and sister; Hyperlipidemia in her sister; Hypertension in an other family member.    ROS:  Please see the history of present illness.   Otherwise, review of systems are positive for none.   All other systems are reviewed and negative.    PHYSICAL EXAM: VS:  BP 118/80 mmHg  Pulse 96  Ht 5\' 2"  (1.575 m)  Wt 146 lb 9.6 oz (66.497 kg)  BMI 26.81 kg/m2  SpO2 96% , BMI Body mass index is 26.81 kg/(m^2). GEN: Well nourished, well developed, in no acute distress HEENT: normal Neck: no JVD, carotid bruits, or masses Cardiac: RRR; no murmurs, rubs, or gallops,no edema  Respiratory:  clear to auscultation bilaterally, normal work of breathing GI: soft, nontender, nondistended, + BS MS: no deformity or atrophy Skin: warm and dry, no rash Neuro:  Strength and sensation are intact Psych: euthymic mood, full affect   EKG:   EKG is not ordered today.    Recent Labs: 05/12/2015: BUN 23; Creatinine, Ser 1.14; Potassium 4.3; Sodium 141 09/07/2015: ALT 11    Lipid Panel    Component Value Date/Time   CHOL 134 09/07/2015 1043   TRIG 103.0 09/07/2015 1043   HDL 45.30 09/07/2015 1043   CHOLHDL 3 09/07/2015 1043   VLDL 20.6 09/07/2015 1043   LDLCALC 68 09/07/2015  1043      Wt Readings from Last 3 Encounters:  11/09/15 146 lb 9.6 oz (66.497 kg)  11/03/15 140 lb (63.504 kg)  09/07/15 147 lb 9.6 oz (66.951 kg)    ASSESSMENT AND PLAN:  1. ASCAD s/p CABG with no angina - continue ASA  2. HTN - well controlled - continue Coreg/Hydralazine 3. Dyslipidemia - at goal - continue Zocor/fish oil  4. MR s/p MV repair 5. Chronic diastolic CHF - well compensated - continue Coreg and Lasix  - check BMET 6. CHB s/p PPM 7. Ischemic DCM - resolved EF 55% 8. Carotid artery stenosis - followed by Dr. Donnetta Hutching    Current medicines are reviewed at length with the patient today.  The patient does not have concerns regarding medicines.  The following changes have been made:  no change  Labs/ tests ordered today: See above Assessment and Plan No orders of the defined types were placed in this encounter.     Disposition:   FU with me in 6 months  Signed, Sueanne Margarita, MD  11/09/2015 8:50 AM    [facility] [place], [place],   [address] Phone: [phone]; Fax: [phone]

## 2015-11-09 ENCOUNTER — Encounter: Payer: Self-pay | Admitting: Cardiology

## 2015-11-09 ENCOUNTER — Ambulatory Visit (INDEPENDENT_AMBULATORY_CARE_PROVIDER_SITE_OTHER): Payer: Commercial Managed Care - HMO | Admitting: Cardiology

## 2015-11-09 VITALS — BP 118/80 | HR 96 | Ht 62.0 in | Wt 146.6 lb

## 2015-11-09 DIAGNOSIS — I5032 Chronic diastolic (congestive) heart failure: Secondary | ICD-10-CM | POA: Diagnosis not present

## 2015-11-09 DIAGNOSIS — I340 Nonrheumatic mitral (valve) insufficiency: Secondary | ICD-10-CM

## 2015-11-09 DIAGNOSIS — I2510 Atherosclerotic heart disease of native coronary artery without angina pectoris: Secondary | ICD-10-CM

## 2015-11-09 DIAGNOSIS — I10 Essential (primary) hypertension: Secondary | ICD-10-CM

## 2015-11-09 DIAGNOSIS — E785 Hyperlipidemia, unspecified: Secondary | ICD-10-CM

## 2015-11-09 DIAGNOSIS — I2583 Coronary atherosclerosis due to lipid rich plaque: Secondary | ICD-10-CM

## 2015-11-09 LAB — BASIC METABOLIC PANEL
BUN: 21 mg/dL (ref 7–25)
CHLORIDE: 105 mmol/L (ref 98–110)
CO2: 24 mmol/L (ref 20–31)
Calcium: 10.2 mg/dL (ref 8.6–10.4)
Creat: 1.1 mg/dL — ABNORMAL HIGH (ref 0.60–0.88)
GLUCOSE: 119 mg/dL — AB (ref 65–99)
POTASSIUM: 4.2 mmol/L (ref 3.5–5.3)
SODIUM: 139 mmol/L (ref 135–146)

## 2015-11-09 NOTE — Patient Instructions (Signed)

## 2015-11-16 ENCOUNTER — Encounter (HOSPITAL_COMMUNITY): Payer: Self-pay | Admitting: *Deleted

## 2015-11-16 ENCOUNTER — Observation Stay (HOSPITAL_COMMUNITY)
Admission: EM | Admit: 2015-11-16 | Discharge: 2015-11-18 | Disposition: A | Discharge status: Home / Self Care | Payer: Commercial Managed Care - HMO | Attending: General Surgery | Admitting: General Surgery

## 2015-11-16 ENCOUNTER — Emergency Department (HOSPITAL_COMMUNITY): Payer: Commercial Managed Care - HMO

## 2015-11-16 DIAGNOSIS — W109XXA Fall (on) (from) unspecified stairs and steps, initial encounter: Secondary | ICD-10-CM | POA: Diagnosis not present

## 2015-11-16 DIAGNOSIS — F419 Anxiety disorder, unspecified: Secondary | ICD-10-CM | POA: Insufficient documentation

## 2015-11-16 DIAGNOSIS — I2510 Atherosclerotic heart disease of native coronary artery without angina pectoris: Secondary | ICD-10-CM | POA: Diagnosis not present

## 2015-11-16 DIAGNOSIS — Z7982 Long term (current) use of aspirin: Secondary | ICD-10-CM | POA: Insufficient documentation

## 2015-11-16 DIAGNOSIS — I6529 Occlusion and stenosis of unspecified carotid artery: Secondary | ICD-10-CM | POA: Diagnosis not present

## 2015-11-16 DIAGNOSIS — Z951 Presence of aortocoronary bypass graft: Secondary | ICD-10-CM | POA: Insufficient documentation

## 2015-11-16 DIAGNOSIS — Z7984 Long term (current) use of oral hypoglycemic drugs: Secondary | ICD-10-CM | POA: Insufficient documentation

## 2015-11-16 DIAGNOSIS — Z8249 Family history of ischemic heart disease and other diseases of the circulatory system: Secondary | ICD-10-CM | POA: Diagnosis not present

## 2015-11-16 DIAGNOSIS — N189 Chronic kidney disease, unspecified: Secondary | ICD-10-CM | POA: Insufficient documentation

## 2015-11-16 DIAGNOSIS — I442 Atrioventricular block, complete: Secondary | ICD-10-CM | POA: Insufficient documentation

## 2015-11-16 DIAGNOSIS — I255 Ischemic cardiomyopathy: Secondary | ICD-10-CM | POA: Diagnosis not present

## 2015-11-16 DIAGNOSIS — E785 Hyperlipidemia, unspecified: Secondary | ICD-10-CM | POA: Insufficient documentation

## 2015-11-16 DIAGNOSIS — Z79899 Other long term (current) drug therapy: Secondary | ICD-10-CM | POA: Diagnosis not present

## 2015-11-16 DIAGNOSIS — S0180XA Unspecified open wound of other part of head, initial encounter: Secondary | ICD-10-CM | POA: Diagnosis present

## 2015-11-16 DIAGNOSIS — Z95810 Presence of automatic (implantable) cardiac defibrillator: Secondary | ICD-10-CM | POA: Diagnosis not present

## 2015-11-16 DIAGNOSIS — W19XXXA Unspecified fall, initial encounter: Secondary | ICD-10-CM | POA: Diagnosis present

## 2015-11-16 DIAGNOSIS — S2231XA Fracture of one rib, right side, initial encounter for closed fracture: Secondary | ICD-10-CM | POA: Diagnosis not present

## 2015-11-16 DIAGNOSIS — I480 Paroxysmal atrial fibrillation: Secondary | ICD-10-CM | POA: Insufficient documentation

## 2015-11-16 DIAGNOSIS — I130 Hypertensive heart and chronic kidney disease with heart failure and stage 1 through stage 4 chronic kidney disease, or unspecified chronic kidney disease: Secondary | ICD-10-CM | POA: Insufficient documentation

## 2015-11-16 DIAGNOSIS — W01198A Fall on same level from slipping, tripping and stumbling with subsequent striking against other object, initial encounter: Secondary | ICD-10-CM | POA: Insufficient documentation

## 2015-11-16 DIAGNOSIS — S0083XA Contusion of other part of head, initial encounter: Secondary | ICD-10-CM | POA: Diagnosis present

## 2015-11-16 DIAGNOSIS — S0990XA Unspecified injury of head, initial encounter: Secondary | ICD-10-CM

## 2015-11-16 DIAGNOSIS — I5042 Chronic combined systolic (congestive) and diastolic (congestive) heart failure: Secondary | ICD-10-CM | POA: Diagnosis not present

## 2015-11-16 DIAGNOSIS — E1122 Type 2 diabetes mellitus with diabetic chronic kidney disease: Secondary | ICD-10-CM | POA: Insufficient documentation

## 2015-11-16 DIAGNOSIS — S0181XA Laceration without foreign body of other part of head, initial encounter: Principal | ICD-10-CM | POA: Insufficient documentation

## 2015-11-16 DIAGNOSIS — Z8673 Personal history of transient ischemic attack (TIA), and cerebral infarction without residual deficits: Secondary | ICD-10-CM | POA: Diagnosis not present

## 2015-11-16 DIAGNOSIS — S0101XA Laceration without foreign body of scalp, initial encounter: Secondary | ICD-10-CM | POA: Diagnosis present

## 2015-11-16 DIAGNOSIS — S2239XA Fracture of one rib, unspecified side, initial encounter for closed fracture: Secondary | ICD-10-CM | POA: Diagnosis present

## 2015-11-16 LAB — BASIC METABOLIC PANEL
ANION GAP: 11 (ref 5–15)
BUN: 19 mg/dL (ref 6–20)
CHLORIDE: 105 mmol/L (ref 101–111)
CO2: 25 mmol/L (ref 22–32)
Calcium: 10 mg/dL (ref 8.9–10.3)
Creatinine, Ser: 1.17 mg/dL — ABNORMAL HIGH (ref 0.44–1.00)
GFR calc non Af Amer: 41 mL/min — ABNORMAL LOW (ref >60)
GFR, EST AFRICAN AMERICAN: 48 mL/min — AB (ref >60)
Glucose, Bld: 149 mg/dL — ABNORMAL HIGH (ref 65–99)
POTASSIUM: 3.9 mmol/L (ref 3.5–5.1)
Sodium: 141 mmol/L (ref 135–145)

## 2015-11-16 LAB — PLATELET COUNT: Platelets: 142 10*3/uL — ABNORMAL LOW (ref 150–400)

## 2015-11-16 LAB — CBC WITH DIFFERENTIAL/PLATELET
BASOS ABS: 0 10*3/uL (ref 0.0–0.1)
Basophils Relative: 0 %
Eosinophils Absolute: 0.2 10*3/uL (ref 0.0–0.7)
Eosinophils Relative: 2 %
HEMATOCRIT: 37.3 % (ref 36.0–46.0)
Hemoglobin: 12.2 g/dL (ref 12.0–15.0)
LYMPHS PCT: 12 %
Lymphs Abs: 1.3 10*3/uL (ref 0.7–4.0)
MCH: 31 pg (ref 26.0–34.0)
MCHC: 32.7 g/dL (ref 30.0–36.0)
MCV: 94.9 fL (ref 78.0–100.0)
MONO ABS: 0.7 10*3/uL (ref 0.1–1.0)
Monocytes Relative: 6 %
NEUTROS ABS: 8.2 10*3/uL — AB (ref 1.7–7.7)
Neutrophils Relative %: 80 %
Platelets: 163 10*3/uL (ref 150–400)
RBC: 3.93 MIL/uL (ref 3.87–5.11)
RDW: 14.1 % (ref 11.5–15.5)
WBC: 10.3 10*3/uL (ref 4.0–10.5)

## 2015-11-16 LAB — GLUCOSE, CAPILLARY
Glucose-Capillary: 146 mg/dL — ABNORMAL HIGH (ref 65–99)
Glucose-Capillary: 178 mg/dL — ABNORMAL HIGH (ref 65–99)

## 2015-11-16 LAB — HEMOGLOBIN AND HEMATOCRIT, BLOOD
HEMATOCRIT: 35.7 % — AB (ref 36.0–46.0)
Hemoglobin: 11.7 g/dL — ABNORMAL LOW (ref 12.0–15.0)

## 2015-11-16 MED ORDER — INSULIN ASPART 100 UNIT/ML ~~LOC~~ SOLN
0.0000 [IU] | Freq: Three times a day (TID) | SUBCUTANEOUS | Status: DC
  Administered 2015-11-16 – 2015-11-17 (×2): 1 [IU] via SUBCUTANEOUS
  Administered 2015-11-17 – 2015-11-18 (×3): 2 [IU] via SUBCUTANEOUS

## 2015-11-16 MED ORDER — CARVEDILOL 6.25 MG PO TABS
6.2500 mg | ORAL_TABLET | Freq: Two times a day (BID) | ORAL | Status: DC
  Administered 2015-11-16 – 2015-11-18 (×4): 6.25 mg via ORAL
  Filled 2015-11-16 (×4): qty 1

## 2015-11-16 MED ORDER — SIMVASTATIN 40 MG PO TABS
40.0000 mg | ORAL_TABLET | Freq: Every day | ORAL | Status: DC
  Administered 2015-11-17: 40 mg via ORAL
  Filled 2015-11-16: qty 1

## 2015-11-16 MED ORDER — ENOXAPARIN SODIUM 40 MG/0.4ML ~~LOC~~ SOLN
40.0000 mg | SUBCUTANEOUS | Status: DC
  Administered 2015-11-16 – 2015-11-17 (×2): 40 mg via SUBCUTANEOUS
  Filled 2015-11-16 (×2): qty 0.4

## 2015-11-16 MED ORDER — GLIPIZIDE ER 10 MG PO TB24
20.0000 mg | ORAL_TABLET | Freq: Every day | ORAL | Status: DC
  Administered 2015-11-17 – 2015-11-18 (×2): 20 mg via ORAL
  Filled 2015-11-16 (×2): qty 2

## 2015-11-16 MED ORDER — LIDOCAINE HCL (PF) 1 % IJ SOLN
5.0000 mL | Freq: Once | INTRAMUSCULAR | Status: AC
  Administered 2015-11-16: 5 mL via INTRADERMAL
  Filled 2015-11-16: qty 5

## 2015-11-16 MED ORDER — ASPIRIN EC 81 MG PO TBEC
81.0000 mg | DELAYED_RELEASE_TABLET | Freq: Every morning | ORAL | Status: DC
  Administered 2015-11-17 – 2015-11-18 (×2): 81 mg via ORAL
  Filled 2015-11-16 (×2): qty 1

## 2015-11-16 MED ORDER — OXYCODONE HCL 5 MG PO TABS
5.0000 mg | ORAL_TABLET | Freq: Once | ORAL | Status: AC
  Administered 2015-11-16: 5 mg via ORAL
  Filled 2015-11-16: qty 1

## 2015-11-16 MED ORDER — ONDANSETRON HCL 4 MG/2ML IJ SOLN: 4.0000 mg | Freq: Four times a day (QID) | INTRAMUSCULAR | Status: DC | PRN

## 2015-11-16 MED ORDER — OXYCODONE HCL 5 MG PO TABS
2.5000 mg | ORAL_TABLET | ORAL | Status: DC | PRN
  Administered 2015-11-16 – 2015-11-17 (×2): 5 mg via ORAL
  Filled 2015-11-16 (×2): qty 1

## 2015-11-16 MED ORDER — VITAMIN D 50 MCG (2000 UT) PO TABS
2000.0000 [IU] | ORAL_TABLET | Freq: Every day | ORAL | Status: DC
  Administered 2015-11-17 – 2015-11-18 (×2): 2000 [IU] via ORAL
  Filled 2015-11-16 (×3): qty 1

## 2015-11-16 MED ORDER — ONDANSETRON HCL 4 MG PO TABS: 4.0000 mg | ORAL_TABLET | Freq: Four times a day (QID) | ORAL | Status: DC | PRN

## 2015-11-16 MED ORDER — OXYBUTYNIN CHLORIDE 5 MG PO TABS
5.0000 mg | ORAL_TABLET | Freq: Two times a day (BID) | ORAL | Status: DC
  Administered 2015-11-16 – 2015-11-18 (×4): 5 mg via ORAL
  Filled 2015-11-16 (×4): qty 1

## 2015-11-16 MED ORDER — ACETAMINOPHEN 325 MG PO TABS
650.0000 mg | ORAL_TABLET | Freq: Once | ORAL | Status: AC
  Administered 2015-11-16: 650 mg via ORAL
  Filled 2015-11-16: qty 2

## 2015-11-16 MED ORDER — DOCUSATE SODIUM 100 MG PO CAPS
100.0000 mg | ORAL_CAPSULE | Freq: Two times a day (BID) | ORAL | Status: DC
  Administered 2015-11-16 – 2015-11-18 (×4): 100 mg via ORAL
  Filled 2015-11-16 (×4): qty 1

## 2015-11-16 MED ORDER — MECLIZINE HCL 25 MG PO TABS
25.0000 mg | ORAL_TABLET | Freq: Every day | ORAL | Status: DC | PRN
  Filled 2015-11-16: qty 1

## 2015-11-16 MED ORDER — MORPHINE SULFATE (PF) 2 MG/ML IV SOLN
2.0000 mg | Freq: Once | INTRAVENOUS | Status: AC
  Administered 2015-11-16: 2 mg via INTRAVENOUS
  Filled 2015-11-16: qty 1

## 2015-11-16 MED ORDER — FUROSEMIDE 20 MG PO TABS
20.0000 mg | ORAL_TABLET | Freq: Every day | ORAL | Status: DC
  Administered 2015-11-17 – 2015-11-18 (×2): 20 mg via ORAL
  Filled 2015-11-16 (×2): qty 1

## 2015-11-16 MED ORDER — HYDRALAZINE HCL 25 MG PO TABS
37.5000 mg | ORAL_TABLET | Freq: Three times a day (TID) | ORAL | Status: DC
  Administered 2015-11-16 – 2015-11-18 (×5): 37.5 mg via ORAL
  Filled 2015-11-16 (×5): qty 2

## 2015-11-16 MED ORDER — POTASSIUM CHLORIDE IN NACL 20-0.45 MEQ/L-% IV SOLN
INTRAVENOUS | Status: DC
  Administered 2015-11-16: 18:00:00 via INTRAVENOUS
  Filled 2015-11-16 (×2): qty 1000

## 2015-11-16 MED ORDER — VITAMIN B-12 1000 MCG PO TABS
1000.0000 ug | ORAL_TABLET | Freq: Every day | ORAL | Status: DC
  Administered 2015-11-17 – 2015-11-18 (×2): 1000 ug via ORAL
  Filled 2015-11-16 (×2): qty 1

## 2015-11-16 NOTE — ED Provider Notes (Signed)
CSN: [id number]     Arrival date & time 11/16/15  [id number] History   First MD Initiated Contact with Patient 11/16/15 1009     Chief Complaint  Patient presents with  . Fall     (Consider location/radiation/quality/duration/timing/severity/associated sxs/prior Treatment) HPI   Blood pressure 169/116, pulse 73, temperature 97.5 F (36.4 C), temperature source Oral, height 5\' 2"  (1.575 m), weight 140 lb (63.504 kg), SpO2 99 %.  [name] is a 81 y.o. female c/o laceration to right forehead  And right-sided rib pain status post mechanical fall just prior to arrival. Patient states she was taking her trash out, she had her foot holding open the screen door in the bag moved and tripped her. Her head hit the side of the house and there was a significant amount of blood loss. There was no loss of consciousness, prodrome of chest pain, shortness of breath. Patient called out for help and school children or walking by: Defense and called 911. She's not anticoagulated. Last tetanus shot was within the last 5 years. Patient was ambulatory to the stretcher. Patient denies leg pain, hip pain, abdominal pain, arm hand or wrist pain, cervical pain. She has a point of tenderness to the right chest which she rates at 5 out of 10 which was not present before the fall. No associated shortness of breath.  Past Medical History  Diagnosis Date  . Ischemic cardiomyopathy     s/p ICD (MDT) by Dr Leonia Reeves 2006, EF 24% at the time but now 55%  . Mitral regurgitation   . Dyslipidemia   . Cerebrovascular disease     extracranial  . Osteoporosis   . Chronic systolic dysfunction of left ventricle   . Fractured shoulder 2009    Right shoulder Fx. from a fall  . Acute on chronic systolic heart failure (HCC)     secondary to complete heart block  . Automatic implantable cardioverter-defibrillator in situ     placed in 2006; removed on 07/29/2013 (07/29/2013)  . Pacemaker 07/29/2013    Medtronic Adapta L DR implanted  with new RA and RV leads inserted  . Exertional shortness of breath   . Stroke [facility]) 07/2005    "when they took me off heart/lung machine; affected my left foot" (07/29/2013)  . Arthritis     "left wrist; back" (07/29/2013)  . Anxiety     "when I come to the hospital; don't take any RX" (07/29/2013)  . Chronic kidney disease, unspecified ([place])   . Overactive bladder   . Coronary artery disease     s/p CABG  . Type II diabetes mellitus ([place])   . Hypertension   . Hyperlipidemia   . Carotid stenosis     occluded right intracranial ICA followed by Dr. Donnetta Hutching  . Dysrhythmia   . Paroxysmal atrial fibrillation (HCC)   . Complete heart block (HCC)     s/p upgrade to duale chamber PPM  . Severe mitral regurgitation     s/p MV ring  . Chronic diastolic CHF (congestive heart failure) ([place])   . Cancer [facility]) Sept. 2015    Left Hand  -  Skin Cancer   Past Surgical History  Procedure Laterality Date  . Cardiac defibrillator placement  11/2005    by Dr Leonia Reeves (MDT) she has a [phone] fidelis lead  . Cataract extraction w/ intraocular lens  implant, bilateral Bilateral 1980's  . Mitral valve replacement  07/2005  . Coronary artery bypass graft  07/2005    "  CABG X3" (07/29/2013)  . Salpingoophorectomy Bilateral 1976  . Total hip arthroplasty Right 2001  . Cardiac catheterization  07/2005  . Cardiac valve replacement  07/2005    "mitral valve" (07/29/2013)  . Appendectomy  1949  . Shoulder arthroscopy w/ rotator cuff repair Right 1998  . Pacemaker placement  07/29/2013    upgrade of single chamber ICD to Medtronic Adapta L DR implanted with new RA and RV leads inserted  . Joint replacement Right 2006    Hip  . Abdominal hysterectomy  ? 1974  . Permanent pacemaker insertion N/A 07/29/2013    Procedure: PERMANENT PACEMAKER INSERTION;  Surgeon: Thompson Grayer, MD;  Location: [facility] CATH LAB;  Service: Cardiovascular;  Laterality: N/A;   Family History  Problem Relation Age of Onset  . Coronary artery disease       family hx of  . Hypertension      family hx of  . Diabetes      family hx of  . Cancer Mother     Theadora Rama   . Heart disease Mother     Before age 4  . Heart disease Sister     Before age 80-  CABG  . Cancer Sister     Lonia Chimera  . Diabetes Sister   . Hyperlipidemia Sister   . Heart attack Sister   . Diabetes Daughter    Social History  Substance Use Topics  . Smoking status: Never Smoker   . Smokeless tobacco: Never Used  . Alcohol Use: No   OB History    No data available     Review of Systems  10 systems reviewed and found to be negative, except as noted in the HPI.   Allergies  Fosamax; Horse-derived products; Alendronate sodium; Clonidine derivatives; Darvocet; Lipitor; Sulfa drugs cross reactors; Tramadol; Vicodin; Penicillins; and Septra  Home Medications   Prior to Admission medications   Medication Sig Start Date End Date Taking? Authorizing Provider  acetaminophen (TYLENOL) 500 MG tablet Take 500 mg by mouth daily as needed (pain).    Yes Historical Provider, MD  aspirin EC 81 MG tablet Take 81 mg by mouth every morning.   Yes Historical Provider, MD  carvedilol (COREG) 12.5 MG tablet TAKE 1/2 TABLET BY MOUTH TWICE DAILY 06/02/15  Yes Sueanne Margarita, MD  Cholecalciferol (VITAMIN D) 2000 UNITS tablet Take 2,000 Units by mouth daily.    Yes Historical Provider, MD  fish oil-omega-3 fatty acids 1000 MG capsule Take 1 g by mouth 2 (two) times daily.    Yes Historical Provider, MD  furosemide (LASIX) 40 MG tablet Take 20 mg by mouth daily.    Yes Historical Provider, MD  glipiZIDE (GLUCOTROL XL) 10 MG 24 hr tablet Take 20 mg by mouth daily.   Yes Historical Provider, MD  hydrALAZINE (APRESOLINE) 25 MG tablet Take 1.5 tablets (37.5 mg total) by mouth 3 (three) times daily. 07/25/15  Yes Sueanne Margarita, MD  meclizine (ANTIVERT) 25 MG tablet Take 25 mg by mouth daily as needed for dizziness.    Yes Historical Provider, MD  Multiple Vitamins-Minerals (CENTRUM SILVER  PO) Take 1 tablet by mouth daily.    Yes Historical Provider, MD  oxybutynin (DITROPAN) 5 MG tablet Take 1 tablet by mouth 2 (two) times daily. 05/02/15  Yes Historical Provider, MD  simvastatin (ZOCOR) 40 MG tablet Take 1 tablet (40 mg total) by mouth at bedtime. 07/25/15  Yes Sueanne Margarita, MD  vitamin B-12 (CYANOCOBALAMIN) 1000 MCG tablet Take  1,000 mcg by mouth daily.   Yes Historical Provider, MD   BP 146/67 mmHg  Pulse 69  Temp(Src) 97.5 F (36.4 C) (Oral)  Ht 5\' 2"  (1.575 m)  Wt 140 lb (63.504 kg)  BMI 25.60 kg/m2  SpO2 97% Physical Exam  Constitutional: She is oriented to person, place, and time. She appears well-developed and well-nourished.  HENT:  Head: Normocephalic.  Mouth/Throat: Oropharynx is clear and moist.  Stellate with avulsion approximately 2 cm to right temple with pulsatile bright red arterial bleed.  No hemotympanum, battle signs or raccoon's eyes  No crepitance or tenderness to palpation along the orbital rim.  EOMI intact with no pain or diplopia  No abnormal otorrhea or rhinorrhea. Nasal septum midline.  No intraoral trauma.  Eyes: Conjunctivae and EOM are normal. Pupils are equal, round, and reactive to light.  Neck: Normal range of motion. Neck supple.  No midline C-spine  tenderness to palpation or step-offs appreciated. Patient has full range of motion without pain.  Grip/Biceps/Tricep strength 5/5 bilaterally, sensation to UE intact bilaterally.    Cardiovascular: Normal rate, regular rhythm and intact distal pulses.   Pulmonary/Chest: Effort normal and breath sounds normal. No respiratory distress. She has no wheezes. She has no rales. She exhibits no tenderness.  No TTP or crepitance  Abdominal: Soft. Bowel sounds are normal. She exhibits no distension and no mass. There is no tenderness. There is no rebound and no guarding.  Musculoskeletal: Normal range of motion. She exhibits no edema or tenderness.  Pelvis stable. No deformity or TTP of  major joints.   Good ROM 4 extremities  Neurological: She is alert and oriented to person, place, and time.  Strength 5/5 x4 extremities   Distal sensation intact  Skin: Skin is warm.  Skin tear to right hand and left index finger  Psychiatric: She has a normal mood and affect.  Nursing note and vitals reviewed.   ED Course  .Marland KitchenLaceration Repair Date/Time: 11/16/2015 12:46 PM Performed by: Monico Blitz Authorized by: Monico Blitz Consent: Verbal consent obtained. Consent given by: patient Patient identity confirmed: verbally with patient Body area: head/neck Location details: forehead Laceration length: 2 cm Vascular damage: yes Anesthesia: local infiltration Local anesthetic: lidocaine 1% without epinephrine Anesthetic total: 2 ml Patient sedated: no Preparation: Patient was prepped and draped in the usual sterile fashion. Irrigation solution: saline Debridement: moderate Degree of undermining: none Skin closure: Ethilon (3.0) Number of sutures: 3 Approximation: loose Approximation difficulty: complex Dressing: pressure dressing Patient tolerance: Patient tolerated the procedure well with no immediate complications Comments: Pulsatile bright red blood bleeding from superior aspect of wound. Figure-of-eight stitch applied to surrounding tissue, 2 simple interrupted deep white stitches applied with improvement in blood loss. Wound is dressed in surgery so, nonadherent dressing and pressure dressing.   (including critical care time) Labs Review Labs Reviewed  HEMOGLOBIN AND HEMATOCRIT, BLOOD - Abnormal; Notable for the following:    Hemoglobin 11.7 (*)    HCT 35.7 (*)    All other components within normal limits  CBC WITH DIFFERENTIAL/PLATELET - Abnormal; Notable for the following:    Neutro Abs 8.2 (*)    All other components within normal limits  PLATELET COUNT    Imaging Review Dg Ribs Unilateral W/chest Right  11/16/2015  CLINICAL DATA:  Right  chest pain status post fall. Initial encounter. EXAM: RIGHT RIBS AND CHEST - 3+ VIEW COMPARISON:  Chest radiographs 01/18/2014. FINDINGS: Left subclavian AICD leads appear unchanged. The heart size and mediastinal contours  are stable status post median sternotomy and mitral valve replacement. The lungs are clear. There is no pleural effusion or pneumothorax. The bones are mildly demineralized. At least 1 acute rib fracture is suggested, involving the anterior aspect of the right fifth rib. In addition, there are probable old right-sided rib fractures. Mild thoracic spondylosis and right shoulder degenerative changes are noted. IMPRESSION: 1. Probable acute fracture of the right fifth rib anterolaterally. No evidence of pneumothorax or significant pleural effusion. 2. Otherwise stable appearance of the chest. Electronically Signed   By: Richardean Sale M.D.   On: 11/16/2015 11:45   Dg Wrist Complete Right  11/16/2015  CLINICAL DATA:  Hand and wrist pain after falling today. Initial encounter. EXAM: RIGHT WRIST - COMPLETE 3+ VIEW COMPARISON:  None. FINDINGS: The bones are moderately demineralized. No evidence of acute fracture or dislocation. There are mild degenerative changes, greatest within the first [facility] articulation. Chondrocalcinosis of the TFCC noted. The soft tissues of the distal forearm appear mildly prominent without apparent focal swelling. IMPRESSION: No acute osseous findings identified. Electronically Signed   By: Richardean Sale M.D.   On: 11/16/2015 13:09   Ct Head Wo Contrast  11/16/2015  CLINICAL DATA:  Head and face trauma secondary to a fall this morning outside at her home. EXAM: CT HEAD WITHOUT CONTRAST CT CERVICAL SPINE WITHOUT CONTRAST TECHNIQUE: Multidetector CT imaging of the head and cervical spine was performed following the standard protocol without intravenous contrast. Multiplanar CT image reconstructions of the cervical spine were also generated. COMPARISON:  CT scans dated  11/02/2014 FINDINGS: CT HEAD FINDINGS There is no acute intracranial hemorrhage, acute infarction, or intracranial mass lesion. There is diffuse moderately severe cerebral cortical and cerebellar atrophy with secondary ventricular dilatation. There is an old small infarct in the left cerebellar hemisphere and there is an old right external capsule infarct adjacent to the frontal horn of the right lateral ventricle. Scattered areas of periventricular white matter lucency consistent with chronic small vessel ischemic disease, stable. No acute osseous abnormality. Prominent scalp hematoma over the right frontal bone with edema of the right temporalis muscle. CT CERVICAL SPINE FINDINGS There is no fracture or prevertebral soft tissue swelling. There is multilevel degenerative disc disease, most prominent at C5-6 and C6-7 with disc space narrowing. There is slight subluxation at C3-4. There is a small broad-based disc protrusion at C4-5. There is moderate facet arthritis at C2-3 through C4-5 on the right and at C7-T1 bilaterally. IMPRESSION: 1. No acute intracranial abnormality. Prominent scalp hematoma. Diffuse atrophy with old lacunar infarcts and chronic small vessel ischemic disease. 2. No acute abnormality of the cervical spine. Multilevel degenerative disc and joint disease. Electronically Signed   By: Lorriane Shire M.D.   On: 11/16/2015 11:20   Ct Cervical Spine Wo Contrast  11/16/2015  CLINICAL DATA:  Head and face trauma secondary to a fall this morning outside at her home. EXAM: CT HEAD WITHOUT CONTRAST CT CERVICAL SPINE WITHOUT CONTRAST TECHNIQUE: Multidetector CT imaging of the head and cervical spine was performed following the standard protocol without intravenous contrast. Multiplanar CT image reconstructions of the cervical spine were also generated. COMPARISON:  CT scans dated 11/02/2014 FINDINGS: CT HEAD FINDINGS There is no acute intracranial hemorrhage, acute infarction, or intracranial mass  lesion. There is diffuse moderately severe cerebral cortical and cerebellar atrophy with secondary ventricular dilatation. There is an old small infarct in the left cerebellar hemisphere and there is an old right external capsule infarct adjacent to the frontal  horn of the right lateral ventricle. Scattered areas of periventricular white matter lucency consistent with chronic small vessel ischemic disease, stable. No acute osseous abnormality. Prominent scalp hematoma over the right frontal bone with edema of the right temporalis muscle. CT CERVICAL SPINE FINDINGS There is no fracture or prevertebral soft tissue swelling. There is multilevel degenerative disc disease, most prominent at C5-6 and C6-7 with disc space narrowing. There is slight subluxation at C3-4. There is a small broad-based disc protrusion at C4-5. There is moderate facet arthritis at C2-3 through C4-5 on the right and at C7-T1 bilaterally. IMPRESSION: 1. No acute intracranial abnormality. Prominent scalp hematoma. Diffuse atrophy with old lacunar infarcts and chronic small vessel ischemic disease. 2. No acute abnormality of the cervical spine. Multilevel degenerative disc and joint disease. Electronically Signed   By: Lorriane Shire M.D.   On: 11/16/2015 11:20   Dg Hand Complete Right  11/16/2015  CLINICAL DATA:  Hand pain status post fall.  Initial encounter. EXAM: RIGHT HAND - COMPLETE 3+ VIEW COMPARISON:  None. FINDINGS: The bones appear moderately demineralized. There is no evidence of acute fracture or dislocation. There are mild degenerative changes throughout the intercarpal and interphalangeal joints. No focal soft tissue abnormalities identified. IMPRESSION: No acute osseous findings.  Mild degenerative changes as described. Electronically Signed   By: Richardean Sale M.D.   On: 11/16/2015 13:08   I have personally reviewed and evaluated these images and lab results as part of my medical decision-making.   EKG  Interpretation None      MDM   Final diagnoses:  Head trauma, initial encounter  Avulsion of skin of face, initial encounter  Right rib fracture, closed, initial encounter    Filed Vitals:   11/16/15 1200 11/16/15 1215 11/16/15 1300 11/16/15 1400  BP: 151/64 137/72 151/70 146/67  Pulse: 69 96 70 69  Temp:      TempSrc:      Height:      Weight:      SpO2: 97% 98% 97% 97%    Medications  morphine 2 MG/ML injection 2 mg (not administered)  lidocaine (PF) (XYLOCAINE) 1 % injection 5 mL (5 mLs Intradermal Given by Other 11/16/15 1140)  acetaminophen (TYLENOL) tablet 650 mg (650 mg Oral Given 11/16/15 1324)    [name] is 80 y.o. female presenting with Right for head laceration status post mechanical fall. Arterial bleed from right temple stellate avulsion, wound is difficult to close due to lack of tissue in the area. Wound is dressed in surgi cell and  pressure dressing.  Patient declines pain medication.  Hemoglobin is 11.7, no significant change from prior.  This is a shared visit with the attending physician who personally evaluated the patient and agrees with the care plan.   Trauma consult from Dr. Hulen Skains appreciated: States if patient is not anticoagulated and not bleeding through the pressure dressing the arterial bleed should stop within 24 hours. Recommends bringing the patient back to the ED to have the dressing removed. Suggest talking with ENT due to the lack of tissue covering the wound.  ENT consult from Dr. Janace Hoard appreciated: States that he thinks he can add much the management of this patient short of taking her to the operating room which doesn't think is necessary and I agree. He has added that patient can follow in his office for dressing removal and wound recheck in the next 24-48 hours.  Chest x-ray shows a nondisplaced right rib fracture. Incentive spirometry given and  patient is allergy to Vicodin. Gave 650 of Tylenol. Patient lives alone, doesn't  have too much support from family. I have discussed with social work who are happy to give her a cab voucher so she can come back for dressing removal however they have said that she is uncomfortable going home.  In discussing this with the patient she states that she is uncomfortable going home, I am worried that she will not to care for herself well taking the pain medication. I discussed this again with Dr. Hulen Skains he will come and evaluate her.  As per trauma AP P no trauma will admit.     Monico Blitz, PA-C 11/16/15 1539  Sherwood Gambler, MD 11/16/15 1700

## 2015-11-16 NOTE — ED Notes (Signed)
Patient transported to CT 

## 2015-11-16 NOTE — ED Notes (Signed)
Pt changed from bloody clothes into a gown and pt helped with cleaning off blood.

## 2015-11-16 NOTE — ED Notes (Signed)
Called lab to add on CBC. Spoke with Maudie Mercury.

## 2015-11-16 NOTE — H&P (Signed)
[name] is an 81 y.o. female.   Chief Complaint: Fall HPI: [name] was taking her trash out and was standing on her porch holding the screen door open with one foot when the trash bag got twisted and she fell down about 4 steps, sriking her head. She denied loss of consciousness or amnesia. She lay on the ground for several minutes until she could summon help from some passersby. She lives alone and has no reliable help.  Past Medical History  Diagnosis Date  . Ischemic cardiomyopathy     s/p ICD (MDT) by Dr Leonia Reeves 2006, EF 24% at the time but now 55%  . Mitral regurgitation   . Dyslipidemia   . Cerebrovascular disease     extracranial  . Osteoporosis   . Chronic systolic dysfunction of left ventricle   . Fractured shoulder 2009    Right shoulder Fx. from a fall  . Acute on chronic systolic heart failure (HCC)     secondary to complete heart block  . Automatic implantable cardioverter-defibrillator in situ     placed in 2006; removed on 07/29/2013 (07/29/2013)  . Pacemaker 07/29/2013    Medtronic Adapta L DR implanted with new RA and RV leads inserted  . Exertional shortness of breath   . Stroke [facility]) 07/2005    "when they took me off heart/lung machine; affected my left foot" (07/29/2013)  . Arthritis     "left wrist; back" (07/29/2013)  . Anxiety     "when I come to the hospital; don't take any RX" (07/29/2013)  . Chronic kidney disease, unspecified ([place])   . Overactive bladder   . Coronary artery disease     s/p CABG  . Type II diabetes mellitus ([place])   . Hypertension   . Hyperlipidemia   . Carotid stenosis     occluded right intracranial ICA followed by Dr. Donnetta Hutching  . Dysrhythmia   . Paroxysmal atrial fibrillation (HCC)   . Complete heart block (HCC)     s/p upgrade to duale chamber PPM  . Severe mitral regurgitation     s/p MV ring  . Chronic diastolic CHF (congestive heart failure) ([place])   . Cancer [facility]) Sept. 2015    Left Hand  -  Skin Cancer    Past Surgical  History  Procedure Laterality Date  . Cardiac defibrillator placement  11/2005    by Dr Leonia Reeves (MDT) she has a [phone] fidelis lead  . Cataract extraction w/ intraocular lens  implant, bilateral Bilateral 1980's  . Mitral valve replacement  07/2005  . Coronary artery bypass graft  07/2005    "CABG X3" (07/29/2013)  . Salpingoophorectomy Bilateral 1976  . Total hip arthroplasty Right 2001  . Cardiac catheterization  07/2005  . Cardiac valve replacement  07/2005    "mitral valve" (07/29/2013)  . Appendectomy  1949  . Shoulder arthroscopy w/ rotator cuff repair Right 1998  . Pacemaker placement  07/29/2013    upgrade of single chamber ICD to Medtronic Adapta L DR implanted with new RA and RV leads inserted  . Joint replacement Right 2006    Hip  . Abdominal hysterectomy  ? 1974  . Permanent pacemaker insertion N/A 07/29/2013    Procedure: PERMANENT PACEMAKER INSERTION;  Surgeon: Thompson Grayer, MD;  Location: [facility] CATH LAB;  Service: Cardiovascular;  Laterality: N/A;    Family History  Problem Relation Age of Onset  . Coronary artery disease      family hx of  . Hypertension  family hx of  . Diabetes      family hx of  . Cancer Mother     Theadora Rama   . Heart disease Mother     Before age 28  . Heart disease Sister     Before age 20-  CABG  . Cancer Sister     Lonia Chimera  . Diabetes Sister   . Hyperlipidemia Sister   . Heart attack Sister   . Diabetes Daughter    Social History:  reports that she has never smoked. She has never used smokeless tobacco. She reports that she does not drink alcohol or use illicit drugs.  Allergies:  Allergies  Allergen Reactions  . Fosamax [Alendronate] Anaphylaxis  . Horse-Derived Products Hives  . Alendronate Sodium Other (See Comments)    UNKNOWN. Pt does not recall being allergic  . Clonidine Derivatives Other (See Comments)    UNKNOWN. Pt does not recall allergy  . Darvocet [Propoxyphene N-Acetaminophen] Nausea And Vomiting  . Lipitor  [Atorvastatin] Swelling  . Sulfa Drugs Cross Reactors Hives and Itching  . Tramadol Nausea And Vomiting  . Vicodin [Hydrocodone-Acetaminophen] Other (See Comments)    lightheaded  . Penicillins Rash    Has patient had a PCN reaction causing immediate rash, facial/tongue/throat swelling, SOB or lightheadedness with hypotension: Yesyes Has patient had a PCN reaction causing severe rash involving mucus membranes or skin necrosis: Nono Has patient had a PCN reaction that required hospitalization Nono Has patient had a PCN reaction occurring within the last 10 years: Nono If all of the above answers are "NO", then may proceed with Cephalosporin  . Septra [Sulfamethoxazole-Trimethoprim] Rash    Results for orders placed or performed during the hospital encounter of 11/16/15 (from the past 48 hour(s))  Hemoglobin and hematocrit, blood     Status: Abnormal   Collection Time: 11/16/15 12:00 PM  Result Value Ref Range   Hemoglobin 11.7 (L) 12.0 - 15.0 g/dL   HCT 35.7 (L) 36.0 - 46.0 %  CBC with Differential     Status: Abnormal   Collection Time: 11/16/15  1:18 PM  Result Value Ref Range   WBC 10.3 4.0 - 10.5 K/uL   RBC 3.93 3.87 - 5.11 MIL/uL   Hemoglobin 12.2 12.0 - 15.0 g/dL   HCT 37.3 36.0 - 46.0 %   MCV 94.9 78.0 - 100.0 fL   MCH 31.0 26.0 - 34.0 pg   MCHC 32.7 30.0 - 36.0 g/dL   RDW 14.1 11.5 - 15.5 %   Platelets 163 150 - 400 K/uL   Neutrophils Relative % 80 %   Neutro Abs 8.2 (H) 1.7 - 7.7 K/uL   Lymphocytes Relative 12 %   Lymphs Abs 1.3 0.7 - 4.0 K/uL   Monocytes Relative 6 %   Monocytes Absolute 0.7 0.1 - 1.0 K/uL   Eosinophils Relative 2 %   Eosinophils Absolute 0.2 0.0 - 0.7 K/uL   Basophils Relative 0 %   Basophils Absolute 0.0 0.0 - 0.1 K/uL   Dg Ribs Unilateral W/chest Right  11/16/2015  CLINICAL DATA:  Right chest pain status post fall. Initial encounter. EXAM: RIGHT RIBS AND CHEST - 3+ VIEW COMPARISON:  Chest radiographs 01/18/2014. FINDINGS: Left subclavian AICD  leads appear unchanged. The heart size and mediastinal contours are stable status post median sternotomy and mitral valve replacement. The lungs are clear. There is no pleural effusion or pneumothorax. The bones are mildly demineralized. At least 1 acute rib fracture is suggested, involving the anterior aspect of  the right fifth rib. In addition, there are probable old right-sided rib fractures. Mild thoracic spondylosis and right shoulder degenerative changes are noted. IMPRESSION: 1. Probable acute fracture of the right fifth rib anterolaterally. No evidence of pneumothorax or significant pleural effusion. 2. Otherwise stable appearance of the chest. Electronically Signed   By: Richardean Sale M.D.   On: 11/16/2015 11:45   Dg Wrist Complete Right  11/16/2015  CLINICAL DATA:  Hand and wrist pain after falling today. Initial encounter. EXAM: RIGHT WRIST - COMPLETE 3+ VIEW COMPARISON:  None. FINDINGS: The bones are moderately demineralized. No evidence of acute fracture or dislocation. There are mild degenerative changes, greatest within the first [facility] articulation. Chondrocalcinosis of the TFCC noted. The soft tissues of the distal forearm appear mildly prominent without apparent focal swelling. IMPRESSION: No acute osseous findings identified. Electronically Signed   By: Richardean Sale M.D.   On: 11/16/2015 13:09   Ct Head Wo Contrast  11/16/2015  CLINICAL DATA:  Head and face trauma secondary to a fall this morning outside at her home. EXAM: CT HEAD WITHOUT CONTRAST CT CERVICAL SPINE WITHOUT CONTRAST TECHNIQUE: Multidetector CT imaging of the head and cervical spine was performed following the standard protocol without intravenous contrast. Multiplanar CT image reconstructions of the cervical spine were also generated. COMPARISON:  CT scans dated 11/02/2014 FINDINGS: CT HEAD FINDINGS There is no acute intracranial hemorrhage, acute infarction, or intracranial mass lesion. There is diffuse moderately severe  cerebral cortical and cerebellar atrophy with secondary ventricular dilatation. There is an old small infarct in the left cerebellar hemisphere and there is an old right external capsule infarct adjacent to the frontal horn of the right lateral ventricle. Scattered areas of periventricular white matter lucency consistent with chronic small vessel ischemic disease, stable. No acute osseous abnormality. Prominent scalp hematoma over the right frontal bone with edema of the right temporalis muscle. CT CERVICAL SPINE FINDINGS There is no fracture or prevertebral soft tissue swelling. There is multilevel degenerative disc disease, most prominent at C5-6 and C6-7 with disc space narrowing. There is slight subluxation at C3-4. There is a small broad-based disc protrusion at C4-5. There is moderate facet arthritis at C2-3 through C4-5 on the right and at C7-T1 bilaterally. IMPRESSION: 1. No acute intracranial abnormality. Prominent scalp hematoma. Diffuse atrophy with old lacunar infarcts and chronic small vessel ischemic disease. 2. No acute abnormality of the cervical spine. Multilevel degenerative disc and joint disease. Electronically Signed   By: Lorriane Shire M.D.   On: 11/16/2015 11:20   Dg Hand Complete Right  11/16/2015  CLINICAL DATA:  Hand pain status post fall.  Initial encounter. EXAM: RIGHT HAND - COMPLETE 3+ VIEW COMPARISON:  None. FINDINGS: The bones appear moderately demineralized. There is no evidence of acute fracture or dislocation. There are mild degenerative changes throughout the intercarpal and interphalangeal joints. No focal soft tissue abnormalities identified. IMPRESSION: No acute osseous findings.  Mild degenerative changes as described. Electronically Signed   By: Richardean Sale M.D.   On: 11/16/2015 13:08    Review of Systems  Constitutional: Negative for weight loss.  HENT: Negative for ear discharge, ear pain, hearing loss and tinnitus.   Eyes: Negative for blurred vision, double  vision, photophobia and pain.  Respiratory: Negative for cough, sputum production and shortness of breath.   Cardiovascular: Positive for chest pain.  Gastrointestinal: Negative for nausea, vomiting and abdominal pain.  Genitourinary: Negative for dysuria, urgency, frequency and flank pain.  Musculoskeletal: Negative for myalgias, back  pain, joint pain, falls and neck pain.  Neurological: Positive for headaches. Negative for dizziness, tingling, sensory change, focal weakness and loss of consciousness.  Endo/Heme/Allergies: Does not bruise/bleed easily.  Psychiatric/Behavioral: Negative for depression, memory loss and substance abuse. The patient is not nervous/anxious.     Blood pressure 146/67, pulse 69, temperature 97.5 F (36.4 C), temperature source Oral, height 5\' 2"  (1.575 m), weight 63.504 kg (140 lb), SpO2 97 %. Physical Exam  Vitals reviewed. Constitutional: She is oriented to person, place, and time. She appears well-developed and well-nourished. She is cooperative. No distress. Nasal cannula in place.  HENT:  Head: Normocephalic. Head is with contusion (OD). Head is without raccoon's eyes, without Battle's sign, without abrasion and without laceration.  Right Ear: Hearing, tympanic membrane, external ear and ear canal normal. No lacerations. No drainage or tenderness. No foreign bodies. Tympanic membrane is not perforated. No hemotympanum.  Left Ear: Hearing, tympanic membrane, external ear and ear canal normal. No lacerations. No drainage or tenderness. No foreign bodies. Tympanic membrane is not perforated. No hemotympanum.  Nose: Nose normal. No nose lacerations, sinus tenderness, nasal deformity or nasal septal hematoma. No epistaxis.  Mouth/Throat: Uvula is midline, oropharynx is clear and moist and mucous membranes are normal. No lacerations. No oropharyngeal exudate.  Eyes: Conjunctivae, EOM and lids are normal. Pupils are equal, round, and reactive to light. Right eye  exhibits no discharge. Left eye exhibits no discharge. No scleral icterus.  Neck: Trachea normal and normal range of motion. Neck supple. No JVD present. No spinous process tenderness and no muscular tenderness present. Carotid bruit is not present. No tracheal deviation present. No thyromegaly present.  Cardiovascular: Normal rate, regular rhythm, normal heart sounds, intact distal pulses and normal pulses.  Exam reveals no gallop and no friction rub.   No murmur heard. Respiratory: Effort normal and breath sounds normal. No stridor. No respiratory distress. She has no wheezes. She has no rales. She exhibits tenderness. She exhibits no bony tenderness, no laceration and no crepitus.  GI: Soft. Normal appearance and bowel sounds are normal. She exhibits no distension. There is no tenderness. There is no rigidity, no rebound, no guarding and no CVA tenderness.  Musculoskeletal: Normal range of motion. She exhibits no edema or tenderness.  Lymphadenopathy:    She has no cervical adenopathy.  Neurological: She is alert and oriented to person, place, and time. No cranial nerve deficit or sensory deficit. GCS eye subscore is 4. GCS verbal subscore is 5. GCS motor subscore is 6.  4/5 RUE, chronic  Skin: Skin is warm, dry and intact. She is not diaphoretic.  Psychiatric: She has a normal mood and affect. Her speech is normal and behavior is normal.     Assessment/Plan Fall Facial contusion Right rib fx -- Pulmonary toilet Multiple medical problems -- Home meds  Admit to trauma, PT/OT, pulmonary toilet. Likely home in AM unless therapies recommend different.    Lisette Abu, PA-C Pager: [phone] General Trauma PA Pager: [phone] 11/16/2015, 3:19 PM

## 2015-11-16 NOTE — ED Notes (Signed)
Pt returned from imaging. Lab called. Elmyra Ricks PA at bedside to suture.

## 2015-11-16 NOTE — ED Notes (Addendum)
Pt arrives from home via [facility]. Pt states she was taking out her trash this morning and the bag twisted and made her lose her balance and fell down. Pt has a 1 inch lac about right eyebrow along with a hematoma. Pt also has a skin tear to right hand and left index finger. Pt denies any pain other than over right eyebrow. Pt denies LOC or being on blood thinners. Pt states she laid there for approx. 10-15 min until 2 highschool students hopped over the fence to help her.

## 2015-11-17 ENCOUNTER — Observation Stay (HOSPITAL_COMMUNITY): Payer: Commercial Managed Care - HMO

## 2015-11-17 DIAGNOSIS — W19XXXA Unspecified fall, initial encounter: Secondary | ICD-10-CM | POA: Diagnosis present

## 2015-11-17 DIAGNOSIS — S0101XA Laceration without foreign body of scalp, initial encounter: Secondary | ICD-10-CM | POA: Diagnosis present

## 2015-11-17 DIAGNOSIS — S0083XA Contusion of other part of head, initial encounter: Secondary | ICD-10-CM | POA: Diagnosis present

## 2015-11-17 LAB — GLUCOSE, CAPILLARY
GLUCOSE-CAPILLARY: 148 mg/dL — AB (ref 65–99)
GLUCOSE-CAPILLARY: 177 mg/dL — AB (ref 65–99)
GLUCOSE-CAPILLARY: 172 mg/dL — AB (ref 65–99)
Glucose-Capillary: 139 mg/dL — ABNORMAL HIGH (ref 65–99)

## 2015-11-17 MED ORDER — MUPIROCIN 2 % EX OINT
TOPICAL_OINTMENT | CUTANEOUS | Status: AC
  Filled 2015-11-17: qty 22

## 2015-11-17 MED ORDER — NAPROXEN 250 MG PO TABS
500.0000 mg | ORAL_TABLET | Freq: Two times a day (BID) | ORAL | Status: DC
  Administered 2015-11-17 – 2015-11-18 (×3): 500 mg via ORAL
  Filled 2015-11-17 (×4): qty 2

## 2015-11-17 MED ORDER — PANTOPRAZOLE SODIUM 40 MG PO TBEC
80.0000 mg | DELAYED_RELEASE_TABLET | Freq: Two times a day (BID) | ORAL | Status: DC
  Administered 2015-11-17 – 2015-11-18 (×3): 80 mg via ORAL
  Filled 2015-11-17 (×3): qty 2

## 2015-11-17 MED ORDER — POLYETHYLENE GLYCOL 3350 17 G PO PACK
17.0000 g | PACK | Freq: Every day | ORAL | Status: DC
  Administered 2015-11-17: 17 g via ORAL
  Filled 2015-11-17 (×2): qty 1

## 2015-11-17 MED ORDER — BACITRACIN ZINC 500 UNIT/GM EX OINT
TOPICAL_OINTMENT | Freq: Every day | CUTANEOUS | Status: DC
  Administered 2015-11-17 – 2015-11-18 (×2): via TOPICAL
  Filled 2015-11-17: qty 28.35

## 2015-11-17 MED ORDER — BACITRACIN ZINC 500 UNIT/GM EX OINT: TOPICAL_OINTMENT | Freq: Two times a day (BID) | CUTANEOUS | Status: DC

## 2015-11-17 NOTE — Progress Notes (Signed)
Patient ID: [name], female   DOB: 01/13/1928, 81 y.o.   MRN: [id number]  LOS: 2 days  Subjective: Feels like she's not doing well, signifcant chest pain with no relief from pain meds. Also c/o ongoing globus sensation.   Objective: Vital signs in last 24 hours: Temp:  [97.5 F (36.4 C)-99 F (37.2 C)] 99 F (37.2 C) (11/17 0533) Pulse Rate:  [59-96] 59 (11/17 0533) Resp:  [16-19] 16 (11/17 0533) BP: (114-169)/(48-116) 139/63 mmHg (11/17 0533) SpO2:  [96 %-99 %] 98 % (11/17 0533) Weight:  [63.504 kg (140 lb)] 63.504 kg (140 lb) (11/16 1000)    IS: 1044ml   Laboratory  CBG (last 3)   Recent Labs  11/16/15 1728 11/16/15 2208  GLUCAP 146* 178*     Radiology Results PORTABLE CHEST 1 VIEW  COMPARISON: Chest x-ray of November 16, 2015  FINDINGS: Deformity of the anterolateral aspect of the right fifth rib is again demonstrated. There is no pneumothorax or pleural effusion or pulmonary contusion. Skin folds are present overlying the lateral aspect of the right lung. The lungs are well-expanded. The heart is top-normal in size. A prosthetic a mitral valve ring is present. The patient has undergone previous median sternotomy. The permanent pacemaker defibrillator is in reasonable position. There is no pulmonary vascular congestion.  IMPRESSION: Anterior lateral right fifth rib fracture. There is no pneumothorax nor other acute cardiopulmonary abnormality.   Electronically Signed  By: David Martinique M.D.  On: 11/17/2015 07:33   Physical Exam General appearance: alert and no distress Resp: clear to auscultation bilaterally Cardio: regular rate and rhythm GI: normal findings: bowel sounds normal and soft, non-tender   Assessment/Plan: Fall Scalp lac Facial contusion Right rib fx -- Pulmonary toilet Multiple medical problems -- Home meds, will try PPI to see if helps globus sensation, has appt with GI coming up FEN -- Add NSAID, SL IV VTE --  SCD's, Lovenox Dispo -- PT/OT    Lisette Abu, PA-C Pager: [phone] General Trauma PA Pager: [phone]  11/17/2015

## 2015-11-17 NOTE — Evaluation (Signed)
Physical Therapy Evaluation Patient Details Name: [name] MRN: [id number] DOB: March 11, 1928 Today's Date: 11/17/2015   History of Present Illness  Pt is an 81 yo female admitted to [facility] on 11/16/15 after falling down her front steps while taking out the trash. Pt with large laceration to face on the R, lacerations to fingers and R 5th rib fx.  Pt with hematoma over R eye affecting vision as well.  Pt with significant PMHx of R shoulder fx (after a fall), systolic heart failure, defibrillator, pacemaker, stroke, exertional SOB, anxiety, left hip arthritis, CKD, CAD, DM II, HTN, PAF, mitral regurgitation s/p mitral valve replacement, diastolic CHF, R THA, and R shoulder arthroscopy with RTC repair.  Clinical Impression  Pt was able to get up and ambulate into the hallway, but without assistive device was significantly unstable on her feet.  She is much safer and requires much less assist with RW than with even one person hand held assist simulating cane.  PT highly encouraged her to continue RW use at home (daughter reports they have been trying to get her to use RW at home and she will not agree to do it).   PT to follow acutely for deficits listed below.       Follow Up Recommendations Home health PT;Supervision for mobility/OOB    Equipment Recommendations  None recommended by PT    Recommendations for Other Services   NA    Precautions / Restrictions Precautions Precautions: Fall Precaution Comments: Pt with history of falls, daughter reports she will not use a cane or RW.       Mobility  Bed Mobility Overal bed mobility: Needs Assistance Bed Mobility: Supine to Sit;Sit to Supine     Supine to sit: Min guard Sit to supine: Mod assist   General bed mobility comments: Min guard assist to support trunk for balance when coming to sitting.  Pt with heavy use of railing to sit up.  Assist needed to help lift both legs back into bed.   Transfers Overall transfer level: Needs  assistance Equipment used: Rolling walker (2 wheeled) Transfers: Sit to/from Stand Sit to Stand: Min guard;Min assist         General transfer comment: Min guard assist to steady pt for balance when going to stand. Attempted both with and without RW, much less assistance with RW to help stabilize once standing.   Ambulation/Gait Ambulation/Gait assistance: Min guard;Min assist Ambulation Distance (Feet): 150 Feet Assistive device: Rolling walker (2 wheeled) Gait Pattern/deviations: Step-through pattern;Shuffle;Trunk flexed Gait velocity: decreased Gait velocity interpretation: <1.8 ft/sec, indicative of risk for recurrent falls General Gait Details: Pt with mildly unsteady gait pattern, heavy min assist with no assistive device at all and staggering gait, min guard assist with RW.  Heavily encouraged pt to use RW at home full time for now until her strength and balance improved.  Pt said she would consider it.  PT educated pt re: the risk of re injry to her head and risk for breaking a larger bone like her hip if she did not use the RW for stability at discharge.   Stairs Stairs: Yes Stairs assistance: Min guard Stair Management: One rail Right;Step to pattern;Forwards Number of Stairs: 4 General stair comments: Pt was able to demonstrate the ability to go up and down 4 steps with min guard assist for safety step to pattern and did demonstrate safe leg sequencing based on which leg she reported was the "bad" leg.  Balance Overall balance assessment: Needs assistance Sitting-balance support: Feet supported;No upper extremity supported Sitting balance-Leahy Scale: Good     Standing balance support: Bilateral upper extremity supported;No upper extremity supported;Single extremity supported Standing balance-Leahy Scale: Fair                               Pertinent Vitals/Pain Pain Assessment: 0-10 Pain Score: 5  Pain Location: central ribs and right side  of head Pain Descriptors / Indicators: Burning;Aching Pain Intervention(s): Limited activity within patient's tolerance;Monitored during session;Repositioned    Home Living Family/patient expects to be discharged to:: Private residence Living Arrangements: Alone Available Help at Discharge: Available PRN/intermittently;Family (no 24 hour assist available.) Type of Home: House Home Access: Stairs to enter Entrance Stairs-Rails: Right Entrance Stairs-Number of Steps: 4 Home Layout: One level Home Equipment: Shower seat - built in;Grab bars - tub/shower;Cane - single point;Walker - 2 wheels Additional Comments: Pt had bathroom redone to be accessible. Pt states she pays for a life line each month but never wears it.  Talked at length about how important it is to wear this life line.    Prior Function Level of Independence: Independent         Comments: Pt has cane but does not use it.        Extremity/Trunk Assessment   Upper Extremity Assessment: Defer to OT evaluation           Lower Extremity Assessment: Generalized weakness      Cervical / Trunk Assessment: Normal  Communication   Communication: No difficulties  Cognition Arousal/Alertness: Awake/alert Behavior During Therapy: WFL for tasks assessed/performed Overall Cognitive Status: Within Functional Limits for tasks assessed                               Assessment/Plan    PT Assessment Patient needs continued PT services  PT Diagnosis Difficulty walking;Abnormality of gait;Generalized weakness;Acute pain   PT Problem List Decreased strength;Decreased activity tolerance;Decreased balance;Decreased mobility;Decreased knowledge of use of DME;Pain  PT Treatment Interventions DME instruction;Gait training;Stair training;Functional mobility training;Therapeutic activities;Therapeutic exercise;Balance training;Neuromuscular re-education;Patient/family education;Modalities   PT Goals (Current goals can  be found in the Care Plan section) Acute Rehab PT Goals Patient Stated Goal: to go home PT Goal Formulation: With patient/family Time For Goal Achievement: 11/24/15 Potential to Achieve Goals: Good    Frequency Min 5X/week   Barriers to discharge Decreased caregiver support daughter is coming to stay with her for a short period of time, but will have to return to work.        End of Session   Activity Tolerance: Patient limited by pain Patient left: in bed;with call bell/phone within reach;with family/visitor present Nurse Communication: Mobility status    Functional Assessment Tool Used: assist level Functional Limitation: Mobility: Walking and moving around Mobility: Walking and Moving Around Current Status [id number]): At least 20 percent but less than 40 percent impaired, limited or restricted Mobility: Walking and Moving Around Goal Status [phone]): At least 1 percent but less than 20 percent impaired, limited or restricted    Time: 1357-1423 PT Time Calculation (min) (ACUTE ONLY): 26 min   Charges:   PT Evaluation $Initial PT Evaluation Tier I: 1 Procedure PT Treatments $Gait Training: 8-22 mins   PT G Codes:   PT G-Codes **NOT FOR INPATIENT CLASS** Functional Assessment Tool Used: assist level Functional Limitation: Mobility: Walking and moving  around Mobility: Walking and Moving Around Current Status [phone]): At least 20 percent but less than 40 percent impaired, limited or restricted Mobility: Walking and Moving Around Goal Status [phone]): At least 1 percent but less than 20 percent impaired, limited or restricted    Lenette Rau B. Khristin Keleher, PT, DPT [phone]   11/17/2015, 6:00 PM

## 2015-11-17 NOTE — Evaluation (Signed)
Occupational Therapy Evaluation Patient Details Name: [name] MRN: [id number] DOB: 08-Feb-1928 Today's Date: 11/17/2015    History of Present Illness Pt is an 81 yo female admitted after falling down her front steps while taking out the trash. Pt with large laceration to face on the R, lacerations to fingers and R 5th rib fx.  Pt with hematoma over R eye affecting vision as well.   Clinical Impression   Pt admitted with the above diagnosis and has the deficits listed below. Pt would benefit from cont OT to increase independence with basic adls to mod I level so she can d/c home alone with family checking in.  Feel if pt had someone that could d/c home with her and stay with her for 3-5 days (or hired help) she could go home with [place] pt is not safe (as of today) to go back home alone due to decrease balance while standing, slightly impaired vision from swelling of R eye and very mild confusion which may be due to pain meds and just being out of her environment.  Feel she is a fall risk to home alone at this point.  Will continue to see acutely.    Follow Up Recommendations  SNF;Supervision/Assistance - 24 hour;Other (comment) (if has 24/7 assist could go home with [facility].)    Equipment Recommendations  3 in 1 bedside comode    Recommendations for Other Services       Precautions / Restrictions Precautions Precautions: Fall Precaution Comments: Pt states she is wobbly a lot on her feet even before this fall.  Pt has a cane and a lifeline and does not use either. Restrictions Weight Bearing Restrictions: No      Mobility Bed Mobility Overal bed mobility: Needs Assistance Bed Mobility: Supine to Sit     Supine to sit: Min guard;HOB elevated     General bed mobility comments: Pt moves well in bed.  Transfers Overall transfer level: Needs assistance Equipment used: 1 person hand held assist Transfers: Sit to/from [employer] Sit to Stand: Min  assist Stand pivot transfers: Min assist       General transfer comment: Pt is unsteady on her feet.  Pt states she is "wobbly" at home as well.    Balance Overall balance assessment: Needs assistance Sitting-balance support: Feet supported Sitting balance-Leahy Scale: Good     Standing balance support: Single extremity supported;During functional activity Standing balance-Leahy Scale: Fair Standing balance comment: Pt was able to stand w/o a walker.  Pt did need outside assist if any steps were taken.                            ADL Overall ADL's : Needs assistance/impaired Eating/Feeding: Supervision/ safety;Sitting (pt reports history of swalling deficits as of late)   Grooming: Oral care;Wash/dry face;Wash/dry hands;Set up;Sitting Grooming Details (indicate cue type and reason): Pt walked into bathroom but did get tired so gromming completed at chair. Upper Body Bathing: Set up;Sitting   Lower Body Bathing: Minimal assistance;Sit to/from stand Lower Body Bathing Details (indicate cue type and reason): min assist to donn socks at EOB. Pt stated her bed is lower at home so donning socks is easier. Upper Body Dressing : Minimal assistance;Sitting Upper Body Dressing Details (indicate cue type and reason): min assist to get shirt over head b/c of pain from rib. Lower Body Dressing: Minimal assistance;Sit to/from stand Lower Body Dressing Details (indicate cue type  and reason): min assist with socks and shoes. Toilet Transfer: Minimal assistance;Ambulation;Grab bars;Comfort height toilet Toilet Transfer Details (indicate cue type and reason): hand held assist to walk to bathroom with IV pole.  Pt unsteady.  Feel she may benefit from cane or walker but pt states she "gets caught up in canes and walkers" and does not like them.  PT to evaluate. Toileting- Clothing Manipulation and Hygiene: Minimal assistance;Sit to/from stand Toileting - Clothing Manipulation Details  (indicate cue type and reason): min assist to maintain balance in standing.     Functional mobility during ADLs: Minimal assistance General ADL Comments: Pt did well with adls but did need minimal assistance.  Pt lives alone and states nobody can stay with her.  Pt with decresed vision due to swelling in eye, states she cannot hear as well b/c of wrap around her head and now is unsteady from wall with rib fx which makes her a high risk to fall again.     Vision Vision Assessment?: Vision impaired- to be further tested in functional context Additional Comments: Feel once swelling subsides pts vision will return to baseline.   Perception Perception Perception Tested?: Yes   Praxis Praxis Praxis tested?: Within functional limits    Pertinent Vitals/Pain Pain Assessment: 0-10 Pain Score: 6  Pain Location: R rib and head Pain Descriptors / Indicators: Aching Pain Intervention(s): Monitored during session;Limited activity within patient's tolerance;Repositioned     Hand Dominance Right   Extremity/Trunk Assessment Upper Extremity Assessment Upper Extremity Assessment: Overall WFL for tasks assessed   Lower Extremity Assessment Lower Extremity Assessment: Defer to PT evaluation   Cervical / Trunk Assessment Cervical / Trunk Assessment: Normal   Communication Communication Communication: No difficulties   Cognition Arousal/Alertness: Awake/alert Behavior During Therapy: WFL for tasks assessed/performed Overall Cognitive Status: No family/caregiver present to determine baseline cognitive functioning                     General Comments       Exercises       Shoulder Instructions      Home Living Family/patient expects to be discharged to:: Private residence Living Arrangements: Alone Available Help at Discharge: Available PRN/intermittently;Family (no 24 hour assist available.) Type of Home: House Home Access: Stairs to enter [employer] of Steps:  4 Entrance Stairs-Rails: Right Home Layout: One level     Bathroom Shower/Tub: Walk-in shower;Door   [employer] Toilet: Standard     Home Equipment: Shower seat - built in;Grab bars - tub/shower;Cane - single point   Additional Comments: Pt had bathroom redone to be accessible. Pt states she pays for a life line each month but never wears it.  Talked at length about how important it is to wear this life line.      Prior Functioning/Environment Level of Independence: Independent        Comments: Pt has cane but does not use it.    OT Diagnosis: Generalized weakness;Disturbance of vision;Acute pain   OT Problem List: Decreased activity tolerance;Impaired balance (sitting and/or standing);Impaired vision/perception;Decreased safety awareness;Decreased knowledge of use of DME or AE;Pain   OT Treatment/Interventions: Self-care/ADL training;DME and/or AE instruction;Therapeutic activities    OT Goals(Current goals can be found in the care plan section) Acute Rehab OT Goals Patient Stated Goal: to go home OT Goal Formulation: With patient Time For Goal Achievement: 12/01/15 Potential to Achieve Goals: Good ADL Goals Pt Will Perform Grooming: with supervision;standing Pt Will Perform Lower Body Dressing: with supervision;sit to/from stand  Additional ADL Goal #1: Pt will bathe in shower with seat with S. Additional ADL Goal #2: Pt will walk to bathroom with appropriate assistive device and toilet with 3:1 over commode with S.  OT Frequency: Min 3X/week   Barriers to D/C: Decreased caregiver support  Pt lives alone and nobody available to assist 24/7.       Co-evaluation              End of Session Nurse Communication: Mobility status  Activity Tolerance: No increased pain;Patient limited by fatigue Patient left: in chair;with call bell/phone within reach   Time: 1220-1245 OT Time Calculation (min): 25 min Charges:  OT General Charges $OT Visit: 1 Procedure OT  Evaluation $Initial OT Evaluation Tier I: 1 Procedure G-Codes: OT G-codes **NOT FOR INPATIENT CLASS** Functional Assessment Tool Used: clincal judgement Functional Limitation: Self care Self Care Current Status [id number]): At least 1 percent but less than 20 percent impaired, limited or restricted Self Care Goal Status [id number]): At least 1 percent but less than 20 percent impaired, limited or restricted  Glenford Peers 11/17/2015, 1:08 PM  [phone]

## 2015-11-17 NOTE — Care Management Note (Signed)
Case Management Note  Patient Details  Name: SHIFA KEADY MRN: [id number] Date of Birth: 1928/07/20  Subjective/Objective:   Pt admitted on 11/16/15 s/p fall down stairs with rt 5th rib fx, large facial laceration and laceration to rt fingers.  PTA, pt independent, lives at home alone.  She has no family in the area, per report.                   Action/Plan: PT/OT recommending SNF for rehab.  CSW consulted to facilitate possible dc to SNF when medically stable.  Will follow progress.    Expected Discharge Date:                  Expected Discharge Plan:  Skilled Nursing Facility  In-House Referral:  Clinical Social Work  Discharge planning Services  CM Consult  Post Acute Care Choice:    Choice offered to:     DME Arranged:    DME Agency:     HH Arranged:    [place] Agency:     Status of Service:  In process, will continue to follow  Medicare Important Message Given:    Date Medicare IM Given:    Medicare IM give by:    Date Additional Medicare IM Given:    Additional Medicare Important Message give by:     If discussed at [place] of Stay Meetings, dates discussed:    Additional Comments:  Reinaldo Raddle, RN, BSN  Trauma/Neuro ICU Case Manager [phone]

## 2015-11-18 ENCOUNTER — Encounter: Payer: Self-pay | Admitting: Orthopedic Surgery

## 2015-11-18 LAB — GLUCOSE, CAPILLARY
GLUCOSE-CAPILLARY: 93 mg/dL (ref 65–99)
Glucose-Capillary: 170 mg/dL — ABNORMAL HIGH (ref 65–99)

## 2015-11-18 MED ORDER — PANTOPRAZOLE SODIUM 40 MG PO TBEC: 80.0000 mg | DELAYED_RELEASE_TABLET | Freq: Two times a day (BID) | ORAL | Status: DC

## 2015-11-18 MED ORDER — NAPROXEN 500 MG PO TABS: 500.0000 mg | ORAL_TABLET | Freq: Two times a day (BID) | ORAL | Status: DC

## 2015-11-18 MED ORDER — OXYCODONE-ACETAMINOPHEN 5-325 MG PO TABS: 0.5000 | ORAL_TABLET | ORAL | Status: DC | PRN

## 2015-11-18 NOTE — Progress Notes (Signed)
Occupational Therapy Treatment Patient Details Name: [name] MRN: [id number] DOB: November 14, 1928 Today's Date: 11/18/2015    History of present illness Pt is an 81 yo female admitted to [facility] on 11/16/15 after falling down her front steps while taking out the trash. Pt with large laceration to face on the R, lacerations to fingers and R 5th rib fx.  Pt with hematoma over R eye affecting vision as well.  Pt with significant PMHx of R shoulder fx (after a fall), systolic heart failure, defibrillator, pacemaker, stroke, exertional SOB, anxiety, left hip arthritis, CKD, CAD, DM II, HTN, PAF, mitral regurgitation s/p mitral valve replacement, diastolic CHF, R THA, and R shoulder arthroscopy with RTC repair.   OT comments  Pt demonstrates deficits with bed mobility and fall risk due to abandoning RW. Pt with urgency with voiding bladder. Pt has family present assisting. Pt could benefit from skilled OT to continue to work on basic transfers and LB adls  Follow Up Recommendations  SNF;Supervision/Assistance - 24 hour;Other (comment)    Equipment Recommendations  3 in 1 bedside comode    Recommendations for Other Services      Precautions / Restrictions Precautions Precautions: Fall Precaution Comments: Pt with history of falls, daughter reports she will not use a cane or RW.        Mobility Bed Mobility Overal bed mobility: Needs Assistance Bed Mobility: Supine to Sit     Supine to sit: Mod assist Sit to supine: Mod assist   General bed mobility comments: reports pain in chest   Transfers Overall transfer level: Needs assistance Equipment used: Rolling walker (2 wheeled) Transfers: Sit to/from Stand Sit to Stand: Min assist         General transfer comment: cues for hand placement    Balance Overall balance assessment: Needs assistance Sitting-balance support: Feet supported;Bilateral upper extremity supported Sitting balance-Leahy Scale: Good     Standing balance  support: No upper extremity supported;During functional activity Standing balance-Leahy Scale: Fair                     ADL Overall ADL's : Needs assistance/impaired Eating/Feeding: Independent Eating/Feeding Details (indicate cue type and reason): eating lunch  Grooming: Wash/dry face;Min guard;Standing Grooming Details (indicate cue type and reason): abandoned RW and reaching for environmental supports     Lower Body Bathing: Minimal assistance;Sit to/from stand   Upper Body Dressing : Minimal assistance;Sitting   Lower Body Dressing: Minimal assistance;Sit to/from stand Lower Body Dressing Details (indicate cue type and reason): provided mesh panties with blue pad due to the normal size pads inability to hold liquid.  Toilet Transfer: Designer, fashion/clothing and Hygiene: Minimal assistance Toileting - Clothing Manipulation Details (indicate cue type and reason): pt with gown in toilet so gown changed this session     Functional mobility during ADLs: Minimal assistance General ADL Comments: cues for safety and hand held (A) because pt abandons RW. pt educated on safety with visual changes due to edema      Vision                     Perception     Praxis      Cognition   Behavior During Therapy: [facility] for tasks assessed/performed Overall Cognitive Status: Within Functional Limits for tasks assessed                       Extremity/Trunk Assessment  Exercises General Exercises - Lower Extremity Long Arc Quad: AROM;Both;10 reps;Seated Hip Flexion/Marching: AROM;20 reps;Seated Toe Raises: AROM;Both;20 reps;Seated Heel Raises: AROM;Both;20 reps;Seated Other Exercises Other Exercises: towel squeezes - 10 reps, seated, strengthening    Shoulder Instructions       General Comments      Pertinent Vitals/ Pain       Pain Assessment: Faces Faces Pain Scale: Hurts even more Pain Location: center of chest  with sit<.stand Pain Intervention(s): Premedicated before session;Monitored during session  Home Living                                          Prior Functioning/Environment              Frequency Min 3X/week     Progress Toward Goals  OT Goals(current goals can now be found in the care plan section)  Progress towards OT goals: Progressing toward goals  Acute Rehab OT Goals Patient Stated Goal: to go home OT Goal Formulation: With patient Time For Goal Achievement: 12/01/15 Potential to Achieve Goals: Good ADL Goals Pt Will Perform Grooming: with supervision;standing Pt Will Perform Lower Body Dressing: with supervision;sit to/from stand Additional ADL Goal #1: Pt will bathe in shower with seat with S. Additional ADL Goal #2: Pt will walk to bathroom with appropriate assistive device and toilet with 3:1 over commode with S.  Plan Discharge plan remains appropriate    Co-evaluation                 End of Session Equipment Utilized During Treatment: Gait belt;Rolling walker   Activity Tolerance Patient tolerated treatment well   Patient Left in chair;with call bell/phone within reach;with family/visitor present   Nurse Communication Mobility status;Precautions        Time: 1350-1420 OT Time Calculation (min): 30 min  Charges: OT General Charges $OT Visit: 1 Procedure OT Treatments $Self Care/Home Management : 23-37 mins  Parke Poisson B 11/18/2015, 3:35 PM   Jeri Modena   OTR/L Pager: [phone] Office: [phone] .

## 2015-11-18 NOTE — Progress Notes (Signed)
Physical Therapy Treatment Patient Details Name: [name] MRN: [id number] DOB: 22-Sep-1928 Today's Date: 11/18/2015    History of Present Illness Pt is an 81 yo female admitted to [facility] on 11/16/15 after falling down her front steps while taking out the trash. Pt with large laceration to face on the R, lacerations to fingers and R 5th rib fx.  Pt with hematoma over R eye affecting vision as well.  Pt with significant PMHx of R shoulder fx (after a fall), systolic heart failure, defibrillator, pacemaker, stroke, exertional SOB, anxiety, left hip arthritis, CKD, CAD, DM II, HTN, PAF, mitral regurgitation s/p mitral valve replacement, diastolic CHF, R THA, and R shoulder arthroscopy with RTC repair.    PT Comments    Pt was again able to stair train today and showed ability to safely ascend/descend stairs with family member's help.  Pt used walker during tx but was found transferring back to the bed without RW after therapist left pt in chair at end of session.  Pt able to perform general LE exercises and was given HEP for home use.  Pt more steady with use of walker during gait training and required only verbal cues for proper sequence.  Pt somewhat stubborn and counteracts your suggestions and education but will use RW when instructed to during tx.     Follow Up Recommendations  Home health PT;Supervision for mobility/OOB     Equipment Recommendations  None recommended by PT       Precautions / Restrictions Precautions Precautions: Fall Precaution Comments: Pt with history of falls, daughter reports she will not use a cane or RW.     Mobility  Bed Mobility Overal bed mobility: Needs Assistance Bed Mobility: Supine to Sit;Sit to Supine     Supine to sit: Min guard Sit to supine: Mod assist   General bed mobility comments: Min guard assist with supine -> sit to bring LE to EOB.  Verbal cues for hand placement to bring trunk up to sitting.  Mod. assist with sit -> supine to bring LE  up on bed, straighten up body, and scoot up in bed with use of bed pad.  Supervision at [facility] for safety and balance.    Transfers Overall transfer level: Needs assistance Equipment used: Rolling walker (2 wheeled) Transfers: Sit to/from Stand Sit to Stand: Min guard         General transfer comment: Min guard to assist pt. with power up.  Verbal cues for hand placement and to not rely on RW when standing.  Pt. requires increased time to stand and bed was slightly elevated.    Ambulation/Gait Ambulation/Gait assistance: Min guard Ambulation Distance (Feet): 150 Feet Assistive device: Rolling walker (2 wheeled) Gait Pattern/deviations: Step-through pattern;Shuffle;Trunk flexed     General Gait Details: Pt. more steady with RW, but lets RW slighty out too far ahead.  Pt. instructed in proper gait sequence and how to avoid getting her feet stuck in RW as she reported that that would happen.  Pt. required therapists steadying hand during ambulation but was able to stand still and take a few steps without therapist holding on to gait belt.  Min guard for safety and control.     Stairs Stairs: Yes Stairs assistance: Min guard Stair Management: One rail Right;Step to pattern;Forwards Number of Stairs: 4 (up and down) General stair comments: Pt. continued to demonstrate ability to ascend/descend stairs with both hands on R handrail.  She said her R leg was the bad leg  but she proceeded to ascend with the R and said it felt better than the left.  Min guard for steadying and safety with stairs.      Balance Overall balance assessment: Needs assistance Sitting-balance support: Bilateral upper extremity supported;Feet supported Sitting balance-Leahy Scale: Good     Standing balance support: Bilateral upper extremity supported Standing balance-Leahy Scale: Fair                      Cognition Arousal/Alertness: Awake/alert Behavior During Therapy: WFL for tasks  assessed/performed Overall Cognitive Status: Within Functional Limits for tasks assessed                      Exercises General Exercises - Lower Extremity Long Arc Quad: AROM;Both;10 reps;Seated Hip Flexion/Marching: AROM;20 reps;Seated Toe Raises: AROM;Both;20 reps;Seated Heel Raises: AROM;Both;20 reps;Seated Other Exercises Other Exercises: towel squeezes - 10 reps, seated, strengthening     General Comments General comments (skin integrity, edema, etc.): Pt. voided on floor during tx.  Therapist cleaned up floor and pt. and applied mesh panties and pad to prepare for gait training in hallway.  Pt. continues to want to go home.  MD came in during session and reported that if her daughter is there all the time, she can go home.  Pt. again instructed on the use of RW for safety and stability with ambulation and pt. seemed to somewhat listen.  Therapist entered room after session to apply chair alarm and found pt. up and getting back in bed with help from family member.  Pt. encouraged to not get up without nurse or helper in room.        Pertinent Vitals/Pain Pain Assessment: No/denies pain (reported that her rib hurt with movement - never gave number)           PT Goals (current goals can now be found in the care plan section) Acute Rehab PT Goals Patient Stated Goal: to go home Progress towards PT goals: Progressing toward goals    Frequency  Min 5X/week    PT Plan Current plan remains appropriate       End of Session Equipment Utilized During Treatment: Gait belt Activity Tolerance: Patient tolerated treatment well Patient left: with call bell/phone within reach;with bed alarm set;with family/visitor present;in bed     Time: [phone] (only charging 2 units due to pt. voiding/cleaning up ) PT Time Calculation (min) (ACUTE ONLY): 51 min  Charges:  1 Gait, [place], [place] - Office   11/18/2015, 12:51 PM

## 2015-11-18 NOTE — Progress Notes (Signed)
Patient ID: [name], female   DOB: 1928-01-08, 81 y.o.   MRN: [id number]  LOS: 3 days  Subjective: [place]. Refusing SNF as she says she doesn't have the money.   Objective: Vital signs in last 24 hours: Temp:  [98.6 F (37 C)-99.1 F (37.3 C)] 98.9 F (37.2 C) (11/18 0614) Pulse Rate:  [58-76] 76 (11/18 0614) Resp:  [17-19] 19 (11/18 0614) BP: (112-141)/(47-100) 126/49 mmHg (11/18 0614) SpO2:  [98 %-99 %] 99 % (11/18 0614)    IS: 10110ml (=)   Physical Exam General appearance: alert and no distress Resp: clear to auscultation bilaterally Cardio: regular rate and rhythm GI: normal findings: bowel sounds normal and soft, non-tender   Assessment/Plan: Fall Scalp lac Facial contusion Right rib fx -- Pulmonary toilet Multiple medical problems -- Home meds, will try PPI to see if helps globus sensation, has appt with GI coming up FEN -- No issues VTE -- SCD's, Lovenox Dispo -- PT/OT, likely home this afternoon    Lisette Abu, PA-C Pager: [phone] General Trauma PA Pager: [phone]  11/18/2015

## 2015-11-18 NOTE — Care Management Note (Addendum)
Case Management Note  Patient Details  Name: [name] MRN: [id number] Date of Birth: 11-25-1928  Subjective/Objective:    PT recommending home with [facility] services.  Pt states, "I really want to go home."  Spoke with Michaele Offer, pt's daughter, by phone, and she states she plans to stay with pt at dc for the next week or so.  Niece at bedside states she can check on pt as well.  Pt states she has RW  at home, and reluctantly states she is going to have to use it.  Pt is agreeable to [facility] care at dc; wishes to use [place] for [facility] care, but they are out of network with her insurance.  She chooses [facility] for [facility] needs.                  Action/Plan: Referral to [facility] for [facility] follow up.  Start of care 24-48h post dc date.  Pt denies DME needs.  (Has RW/BSC)   Expected Discharge Date:                  Expected Discharge Plan:  [place]  In-House Referral:  Clinical Social Work  Discharge planning Services  CM Consult  Post Acute Care Choice:    Choice offered to:  Patient  DME Arranged:    DME Agency:     HH Arranged:  RN, PT, OT HH Agency:  [place]  Status of Service:  Completed, signed off  Medicare Important Message Given:    Date Medicare IM Given:    Medicare IM give by:    Date Additional Medicare IM Given:    Additional Medicare Important Message give by:     If discussed at [place] of Stay Meetings, dates discussed:    Additional Comments: Pt's daughter, Michaele Offer requests note for her work stating mother's dates of hospitalization.    Reinaldo Raddle, RN, BSN  Trauma/Neuro ICU Case Manager [phone]

## 2015-11-18 NOTE — Progress Notes (Signed)
[name] to be D/C'd  per MD order. Discussed with the patient and all questions fully answered.  VSS, Skin clean, dry and intact without evidence of skin break down, no evidence of skin tears noted.  IV catheter discontinued intact. Site without signs and symptoms of complications. Dressing and pressure applied.  An After Visit Summary was printed and given to the patient. Patient received prescription.  D/c education completed with patient/family including follow up instructions, medication list, d/c activities limitations if indicated, with other d/c instructions as indicated by MD - patient able to verbalize understanding, all questions fully answered.   Patient instructed to return to ED, call 911, or call MD for any changes in condition.   Patient to be escorted via [place], and D/C home via private auto.

## 2015-11-18 NOTE — Discharge Summary (Signed)
Physician Discharge Summary  Patient ID: [name] MRN: [id number] DOB/AGE: 79-Apr-1929 79 y.o.  Admit date: 11/16/2015 Discharge date: 11/18/2015  Discharge Diagnoses Patient Active Problem List   Diagnosis Date Noted  . Fall 11/17/2015  . Scalp laceration 11/17/2015  . Facial contusion 11/17/2015  . Rib fracture 11/16/2015  . Paresthesia of both hands 01/18/2014  . Renal insufficiency 01/18/2014  . Lower extremity weakness 01/18/2014  . Coronary artery disease   . Type II diabetes mellitus ([place])   . Hyperlipidemia   . Carotid stenosis   . Complete heart block ([place])   . Severe mitral regurgitation   . Chronic diastolic CHF (congestive heart failure) ([place])   . Postoperative atrial fibrillation ([place]) 10/29/2013  . 6949 lead 07/21/2013  . Chronic kidney disease, unspecified ([place])   . Occlusion and stenosis of carotid artery without mention of cerebral infarction 10/14/2012  . Hypertension 04/30/2011    Consultants None   Procedures None   HPI: [name] was taking her trash out and was standing on her porch holding the screen door open with one foot when the trash bag got twisted and she fell down about 4 steps, sriking her head. She denied loss of consciousness or amnesia. She lay on the ground for several minutes until she could summon help from some passersby. Her workup included CT scan of her head and cervical spine as well as thoracic x-rays which showed only a single rib fracture. She was admitted for pain control and pulmonary toilet.   Hospital Course: The patient did not suffer any respiratory compromise from her rib fracture. Her pain was controlled on oral medication. She was evaluated by physical and occupational therapies who originally recommended skilled nursing facility placement but the patient was resistant to that. After another day it was felt she could safely return home with home health therapies and help from her daughter. She was discharged home in good  condition.     Medication List    TAKE these medications        acetaminophen 500 MG tablet  Commonly known as:  TYLENOL  Take 500 mg by mouth daily as needed (pain).     aspirin EC 81 MG tablet  Take 81 mg by mouth every morning.     carvedilol 12.5 MG tablet  Commonly known as:  COREG  TAKE 1/2 TABLET BY MOUTH TWICE DAILY     CENTRUM SILVER PO  Take 1 tablet by mouth daily.     fish oil-omega-3 fatty acids 1000 MG capsule  Take 1 g by mouth 2 (two) times daily.     furosemide 40 MG tablet  Commonly known as:  LASIX  Take 20 mg by mouth daily.     glipiZIDE 10 MG 24 hr tablet  Commonly known as:  GLUCOTROL XL  Take 20 mg by mouth daily.     hydrALAZINE 25 MG tablet  Commonly known as:  APRESOLINE  Take 1.5 tablets (37.5 mg total) by mouth 3 (three) times daily.     meclizine 25 MG tablet  Commonly known as:  ANTIVERT  Take 25 mg by mouth daily as needed for dizziness.     naproxen 500 MG tablet  Commonly known as:  NAPROSYN  Take 1 tablet (500 mg total) by mouth 2 (two) times daily with a meal.     oxybutynin 5 MG tablet  Commonly known as:  DITROPAN  Take 1 tablet by mouth 2 (two) times daily.     oxyCODONE-acetaminophen  5-325 MG tablet  Commonly known as:  ROXICET  Take 0.5-1 tablets by mouth every 4 (four) hours as needed (Pain).     pantoprazole 40 MG tablet  Commonly known as:  PROTONIX  Take 2 tablets (80 mg total) by mouth 2 (two) times daily.     simvastatin 40 MG tablet  Commonly known as:  ZOCOR  Take 1 tablet (40 mg total) by mouth at bedtime.     vitamin B-12 1000 MCG tablet  Commonly known as:  CYANOCOBALAMIN  Take 1,000 mcg by mouth daily.     Vitamin D 2000 UNITS tablet  Take 2,000 Units by mouth daily.            Follow-up Information    Follow up with [facility].   Why:  Nurse, physical therapy and occupational therapy to follow up with you at home   Contact information:   [address] [place] [address] [phone]       Schedule an appointment as soon as possible for a visit with Mayra Neer, MD.   Specialty:  Family Medicine   Contact information:   [address] [place] [address] [phone]       Call [facility].   Why:  As needed   Contact information:   [address] [id number] [place] [phone]       Signed: Lisette Abu, PA-C Pager: [phone] General Trauma PA Pager: [phone] 11/18/2015, 3:04 PM

## 2015-12-03 ENCOUNTER — Other Ambulatory Visit: Payer: Self-pay | Admitting: Cardiology

## 2015-12-07 ENCOUNTER — Ambulatory Visit (INDEPENDENT_AMBULATORY_CARE_PROVIDER_SITE_OTHER): Payer: Commercial Managed Care - HMO | Admitting: *Deleted

## 2015-12-07 DIAGNOSIS — I442 Atrioventricular block, complete: Secondary | ICD-10-CM

## 2015-12-07 NOTE — Progress Notes (Signed)
Remote pacemaker transmission.   

## 2015-12-08 ENCOUNTER — Telehealth: Payer: Self-pay | Admitting: Internal Medicine

## 2015-12-08 NOTE — Telephone Encounter (Signed)
Follow Up ° °4. Are you calling to see if we received your device transmission? Yes  ° ° ° °

## 2015-12-08 NOTE — Telephone Encounter (Signed)
Informed pt that her transmission was received. She verbalized understanding.  

## 2015-12-12 ENCOUNTER — Ambulatory Visit: Payer: Commercial Managed Care - HMO | Admitting: Internal Medicine

## 2015-12-15 LAB — CUP PACEART REMOTE DEVICE CHECK
Battery Remaining Longevity: 132 mo
Brady Statistic AP VP Percent: 15 %
Brady Statistic AS VS Percent: 22 %
Date Time Interrogation Session: 20161207153758
Implantable Lead Implant Date: 20140730
Implantable Lead Location: 753859
Implantable Lead Model: 5076
Implantable Lead Model: 5076
Lead Channel Pacing Threshold Amplitude: 0.625 V
Lead Channel Pacing Threshold Pulse Width: 0.4 ms
Lead Channel Sensing Intrinsic Amplitude: 8 mV
Lead Channel Setting Pacing Amplitude: 2 V
Lead Channel Setting Pacing Amplitude: 2.5 V
Lead Channel Setting Pacing Pulse Width: 0.4 ms
Lead Channel Setting Sensing Sensitivity: 4 mV
MDC IDC LEAD IMPLANT DT: 20140730
MDC IDC LEAD LOCATION: 753860
MDC IDC MSMT BATTERY IMPEDANCE: 135 Ohm
MDC IDC MSMT BATTERY VOLTAGE: 2.79 V
MDC IDC MSMT LEADCHNL RA IMPEDANCE VALUE: 447 Ohm
MDC IDC MSMT LEADCHNL RA PACING THRESHOLD AMPLITUDE: 0.625 V
MDC IDC MSMT LEADCHNL RA PACING THRESHOLD PULSEWIDTH: 0.4 ms
MDC IDC MSMT LEADCHNL RA SENSING INTR AMPL: 1.4 mV
MDC IDC MSMT LEADCHNL RV IMPEDANCE VALUE: 436 Ohm
MDC IDC STAT BRADY AP VS PERCENT: 60 %
MDC IDC STAT BRADY AS VP PERCENT: 3 %

## 2015-12-21 ENCOUNTER — Encounter: Payer: Self-pay | Admitting: Cardiology

## 2016-02-13 ENCOUNTER — Encounter: Payer: Self-pay | Admitting: Internal Medicine

## 2016-03-07 ENCOUNTER — Telehealth: Payer: Self-pay | Admitting: Internal Medicine

## 2016-03-07 ENCOUNTER — Ambulatory Visit (INDEPENDENT_AMBULATORY_CARE_PROVIDER_SITE_OTHER): Payer: Medicare Other | Admitting: *Deleted

## 2016-03-07 DIAGNOSIS — I442 Atrioventricular block, complete: Secondary | ICD-10-CM

## 2016-03-07 NOTE — Telephone Encounter (Signed)
Pt wants to know if you received her transmission?

## 2016-03-07 NOTE — Telephone Encounter (Signed)
Transmission was received. We will review and send a letter out afterwards. I advised her that in the future we will call her if her scheduled transmission was not received in the morning for a reminder. She is agreeable.

## 2016-03-09 NOTE — Progress Notes (Signed)
Remote pacemaker transmission.   

## 2016-03-10 LAB — CUP PACEART REMOTE DEVICE CHECK
Brady Statistic AP VP Percent: 23 %
Brady Statistic AS VP Percent: 5 %
Brady Statistic AS VS Percent: 14 %
Date Time Interrogation Session: 20170308120324
Implantable Lead Implant Date: 20140730
Implantable Lead Location: 753860
Implantable Lead Model: 5076
Lead Channel Setting Pacing Amplitude: 2.5 V
Lead Channel Setting Sensing Sensitivity: 4 mV
MDC IDC LEAD IMPLANT DT: 20140730
MDC IDC LEAD LOCATION: 753859
MDC IDC MSMT BATTERY IMPEDANCE: 135 Ohm
MDC IDC MSMT BATTERY REMAINING LONGEVITY: 128 mo
MDC IDC MSMT BATTERY VOLTAGE: 2.78 V
MDC IDC MSMT LEADCHNL RA IMPEDANCE VALUE: 428 Ohm
MDC IDC MSMT LEADCHNL RV IMPEDANCE VALUE: 434 Ohm
MDC IDC SET LEADCHNL RA PACING AMPLITUDE: 2 V
MDC IDC SET LEADCHNL RV PACING PULSEWIDTH: 0.4 ms
MDC IDC STAT BRADY AP VS PERCENT: 58 %

## 2016-03-10 NOTE — Progress Notes (Signed)
Normal remote reviewed.  Next [facility] 06/06/16 

## 2016-03-14 ENCOUNTER — Encounter: Payer: Self-pay | Admitting: Cardiology

## 2016-05-13 ENCOUNTER — Encounter: Payer: Self-pay | Admitting: Cardiology

## 2016-05-13 NOTE — Progress Notes (Signed)
Cardiology Office Note    Date:  05/14/2016   ID:  MEGHANNE STEEGE, DOB 04/18/1928, MRN [id number]  PCP:  Mayra Neer, MD  Cardiologist:  Fransico Him, MD   Chief Complaint  Patient presents with  . Congestive Heart Failure    pt c/o pain in left arm  . Coronary Artery Disease  . Hypertension  . Hyperlipidemia    History of Present Illness:  [name] is a 82 y.o. female who presents today for followup of her ischemic DCM, chronic diastolic CHF, ASCAD, dyslipidemia, MR s/p MV repair, and HTN. She is doing well. She denies any chest pain, LE edema, palpitations or syncope. She occasionally has some DOE if she is rushing but otherwise no problems with SOB. She occasionally will fell dizzy when going from sitting to standing or standing for a while.    Past Medical History  Diagnosis Date  . Ischemic cardiomyopathy     s/p ICD (MDT) by Dr Leonia Reeves 2006, EF 24% at the time but now 55%  . Cerebrovascular disease     extracranial  . Osteoporosis   . Fractured shoulder 2009    Right shoulder Fx. from a fall  . Acute on chronic systolic heart failure (HCC)     secondary to complete heart block  . Automatic implantable cardioverter-defibrillator in situ     placed in 2006; removed on 07/29/2013 (07/29/2013)  . Complete heart block ([place]) 07/29/2013    Medtronic Adapta L DR implanted with new RA and RV leads inserted  . Stroke [facility]) 07/2005    "when they took me off heart/lung machine; affected my left foot" (07/29/2013)  . Arthritis     "left wrist; back" (07/29/2013)  . Anxiety     "when I come to the hospital; don't take any RX" (07/29/2013)  . Chronic kidney disease, unspecified ([place])   . Overactive bladder   . Coronary artery disease     s/p CABG  . Type II diabetes mellitus ([place])   . Hypertension   . Hyperlipidemia   . Carotid stenosis     occluded right intracranial ICA followed by Dr. Donnetta Hutching.  1-39% bilateral ICA stenosis  . Paroxysmal atrial fibrillation (HCC)   .  Severe mitral regurgitation     s/p MV ring  . Chronic diastolic CHF (congestive heart failure) ([place])   . Cancer [facility]) Sept. 2015    Left Hand  -  Skin Cancer    Past Surgical History  Procedure Laterality Date  . Cardiac defibrillator placement  11/2005    by Dr Leonia Reeves (MDT) she has a [phone] fidelis lead  . Cataract extraction w/ intraocular lens  implant, bilateral Bilateral 1980's  . Mitral valve replacement  07/2005  . Coronary artery bypass graft  07/2005    "CABG X3" (07/29/2013)  . Salpingoophorectomy Bilateral 1976  . Total hip arthroplasty Right 2001  . Cardiac catheterization  07/2005  . Cardiac valve replacement  07/2005    "mitral valve" (07/29/2013)  . Appendectomy  1949  . Shoulder arthroscopy w/ rotator cuff repair Right 1998  . Pacemaker placement  07/29/2013    upgrade of single chamber ICD to Medtronic Adapta L DR implanted with new RA and RV leads inserted  . Joint replacement Right 2006    Hip  . Abdominal hysterectomy  ? 1974  . Permanent pacemaker insertion N/A 07/29/2013    Procedure: PERMANENT PACEMAKER INSERTION;  Surgeon: Thompson Grayer, MD;  [facility];  Service: Cardiovascular;  Laterality: N/A;    Current Medications: Outpatient Prescriptions Prior to Visit  Medication Sig Dispense Refill  . acetaminophen (TYLENOL) 500 MG tablet Take 500 mg by mouth daily as needed (pain).     Marland Kitchen aspirin EC 81 MG tablet Take 81 mg by mouth every morning.    . carvedilol (COREG) 12.5 MG tablet TAKE 1/2 TABLET BY MOUTH TWICE DAILY 30 tablet 11  . Cholecalciferol (VITAMIN D) 2000 UNITS tablet Take 2,000 Units by mouth daily.     . fish oil-omega-3 fatty acids 1000 MG capsule Take 1 g by mouth 2 (two) times daily.     Marland Kitchen glipiZIDE (GLUCOTROL XL) 10 MG 24 hr tablet Take 20 mg by mouth daily.    . hydrALAZINE (APRESOLINE) 25 MG tablet Take 1.5 tablets (37.5 mg total) by mouth 3 (three) times daily. 135 tablet 11  . Multiple Vitamins-Minerals (CENTRUM SILVER PO) Take 1  tablet by mouth daily.     Marland Kitchen oxybutynin (DITROPAN) 5 MG tablet Take 1 tablet by mouth 2 (two) times daily.  3  . simvastatin (ZOCOR) 40 MG tablet Take 1 tablet (40 mg total) by mouth at bedtime. 30 tablet 11  . vitamin B-12 (CYANOCOBALAMIN) 1000 MCG tablet Take 1,000 mcg by mouth daily.    . furosemide (LASIX) 40 MG tablet Take 20 mg by mouth daily. Reported on 05/14/2016    . meclizine (ANTIVERT) 25 MG tablet Take 25 mg by mouth daily as needed for dizziness. Reported on 05/14/2016    . naproxen (NAPROSYN) 500 MG tablet Take 1 tablet (500 mg total) by mouth 2 (two) times daily with a meal. (Patient not taking: Reported on 05/14/2016) 60 tablet 1  . oxyCODONE-acetaminophen (ROXICET) 5-325 MG tablet Take 0.5-1 tablets by mouth every 4 (four) hours as needed (Pain). (Patient not taking: Reported on 05/14/2016) 30 tablet 0  . pantoprazole (PROTONIX) 40 MG tablet Take 2 tablets (80 mg total) by mouth 2 (two) times daily. (Patient not taking: Reported on 05/14/2016) 100 tablet 0   No facility-administered medications prior to visit.     Allergies:   Fosamax; Horse-derived products; Alendronate sodium; Clonidine derivatives; Darvocet; Lipitor; Sulfa drugs cross reactors; Tramadol; Vicodin; Penicillins; and Septra   Social History   Social History  . Marital Status: Widowed    Spouse Name: N/A  . Number of Children: N/A  . Years of Education: N/A   Occupational History  . retired Marine scientist    Social History Main Topics  . Smoking status: Never Smoker   . Smokeless tobacco: Never Used  . Alcohol Use: No  . Drug Use: No  . Sexual Activity: No   Other Topics Concern  . None   Social History Narrative     Family History:  The patient's family history includes Cancer in her mother and sister; Diabetes in her daughter and sister; Heart attack in her sister; Heart disease in her mother and sister; Hyperlipidemia in her sister.   ROS:   Please see the history of present illness.    Review of  Systems  Musculoskeletal: Positive for joint pain.   All other systems reviewed and are negative.   PHYSICAL EXAM:   VS:  BP 140/78 mmHg  Pulse 82  Ht 5\' 2"  (1.575 m)  Wt 133 lb (60.328 kg)  BMI 24.32 kg/m2  SpO2 97%   GEN: Well nourished, well developed, in no acute distress HEENT: normal Neck: no JVD, carotid bruits, or masses Cardiac: RRR; no rubs, or gallops,no edema.  Intact distal pulses bilaterally. 2/6 SM at LLSB to apex Respiratory:  clear to auscultation bilaterally, normal work of breathing GI: soft, nontender, nondistended, + BS MS: no deformity or atrophy Skin: warm and dry, no rash Neuro:  Alert and Oriented x 3, Strength and sensation are intact Psych: euthymic mood, full affect  Wt Readings from Last 3 Encounters:  05/14/16 133 lb (60.328 kg)  11/16/15 140 lb (63.504 kg)  11/09/15 146 lb 9.6 oz (66.497 kg)      Studies/Labs Reviewed:   EKG:  EKG is  ordered today and showed atrial pacd rhythm with nonspecific IVCD  Recent Labs: 09/07/2015: ALT 11 11/16/2015: BUN 19; Creatinine, Ser 1.17*; Hemoglobin 12.2; Platelets 142*; Potassium 3.9; Sodium 141   Lipid Panel    Component Value Date/Time   CHOL 134 09/07/2015 1043   TRIG 103.0 09/07/2015 1043   HDL 45.30 09/07/2015 1043   CHOLHDL 3 09/07/2015 1043   VLDL 20.6 09/07/2015 1043   LDLCALC 68 09/07/2015 1043    Additional studies/ records that were reviewed today include:  none    ASSESSMENT:    1. Chronic diastolic CHF (congestive heart failure) ([place])   2. Complete heart block (HCC)   3. Carotid stenosis, unspecified laterality   4. Coronary artery disease due to lipid rich plaque   5. Essential hypertension   6. Hyperlipidemia   7. Severe mitral regurgitation      PLAN:  In order of problems listed above:  1. Chronic diastolic CHF - appears euvolemic on exam.  Continue BB/diuretic. 2. Complete heart block s/p PPM - followed by EP 3. Carotid stenosis 1-39% bilateral - continue  ASA/statin.  Repeat study in 2 years.  4. ASCAD s/p remote CABG with no angina.  Continue statin/BB/ASA 5. HTN - BP well controlled on BB 6. Dyslipidemia - LDL goal < 70. Continue statin.  Repeat FLp and ALT>  7. Severe MR s/p MV repair - she has a systolic murmur at the apex - recheck 2D echo to reassess for MR    Medication Adjustments/Labs and Tests Ordered: Current medicines are reviewed at length with the patient today.  Concerns regarding medicines are outlined above.  Medication changes, Labs and Tests ordered today are listed in the Patient Instructions below.  There are no Patient Instructions on file for this visit.   Signed, Fransico Him, MD  05/14/2016 11:26 AM    [address], [place], [place]  [address] Phone: [phone]; Fax: [phone]

## 2016-05-14 ENCOUNTER — Ambulatory Visit (INDEPENDENT_AMBULATORY_CARE_PROVIDER_SITE_OTHER): Payer: Medicare Other | Admitting: Cardiology

## 2016-05-14 ENCOUNTER — Encounter: Payer: Self-pay | Admitting: Cardiology

## 2016-05-14 VITALS — BP 140/78 | HR 82 | Ht 62.0 in | Wt 133.0 lb

## 2016-05-14 DIAGNOSIS — I6529 Occlusion and stenosis of unspecified carotid artery: Secondary | ICD-10-CM | POA: Diagnosis not present

## 2016-05-14 DIAGNOSIS — I2583 Coronary atherosclerosis due to lipid rich plaque: Secondary | ICD-10-CM

## 2016-05-14 DIAGNOSIS — I5032 Chronic diastolic (congestive) heart failure: Secondary | ICD-10-CM | POA: Diagnosis not present

## 2016-05-14 DIAGNOSIS — E785 Hyperlipidemia, unspecified: Secondary | ICD-10-CM

## 2016-05-14 DIAGNOSIS — I2510 Atherosclerotic heart disease of native coronary artery without angina pectoris: Secondary | ICD-10-CM | POA: Diagnosis not present

## 2016-05-14 DIAGNOSIS — I442 Atrioventricular block, complete: Secondary | ICD-10-CM | POA: Diagnosis not present

## 2016-05-14 DIAGNOSIS — I10 Essential (primary) hypertension: Secondary | ICD-10-CM

## 2016-05-14 DIAGNOSIS — I340 Nonrheumatic mitral (valve) insufficiency: Secondary | ICD-10-CM

## 2016-05-14 DIAGNOSIS — R011 Cardiac murmur, unspecified: Secondary | ICD-10-CM | POA: Insufficient documentation

## 2016-05-14 NOTE — Patient Instructions (Signed)
Medication Instructions:  Your physician recommends that you continue on your current medications as directed. Please refer to the Current Medication list given to you today.   Labwork: Your physician recommends that you return for FASTING lab work the same day as your ECHO.  Testing/Procedures: Your physician has requested that you have an echocardiogram. Echocardiography is a painless test that uses sound waves to create images of your heart. It provides your doctor with information about the size and shape of your heart and how well your heart's chambers and valves are working. This procedure takes approximately one hour. There are no restrictions for this procedure.  Follow-Up: Your physician wants you to follow-up in: 6 months with Dr. Turner. You will receive a reminder letter in the mail two months in advance. If you don't receive a letter, please call our office to schedule the follow-up appointment.   Any Other Special Instructions Will Be Listed Below (If Applicable).     If you need a refill on your cardiac medications before your next appointment, please call your pharmacy.   

## 2016-05-30 ENCOUNTER — Other Ambulatory Visit: Payer: Self-pay

## 2016-05-30 ENCOUNTER — Other Ambulatory Visit (INDEPENDENT_AMBULATORY_CARE_PROVIDER_SITE_OTHER): Payer: Medicare Other | Admitting: *Deleted

## 2016-05-30 ENCOUNTER — Ambulatory Visit (HOSPITAL_COMMUNITY): Payer: Medicare Other | Attending: Cardiology

## 2016-05-30 DIAGNOSIS — I351 Nonrheumatic aortic (valve) insufficiency: Secondary | ICD-10-CM | POA: Insufficient documentation

## 2016-05-30 DIAGNOSIS — I509 Heart failure, unspecified: Secondary | ICD-10-CM | POA: Insufficient documentation

## 2016-05-30 DIAGNOSIS — I071 Rheumatic tricuspid insufficiency: Secondary | ICD-10-CM | POA: Diagnosis not present

## 2016-05-30 DIAGNOSIS — I340 Nonrheumatic mitral (valve) insufficiency: Secondary | ICD-10-CM | POA: Diagnosis not present

## 2016-05-30 DIAGNOSIS — E785 Hyperlipidemia, unspecified: Secondary | ICD-10-CM | POA: Insufficient documentation

## 2016-05-30 DIAGNOSIS — I6529 Occlusion and stenosis of unspecified carotid artery: Secondary | ICD-10-CM | POA: Insufficient documentation

## 2016-05-30 DIAGNOSIS — I5032 Chronic diastolic (congestive) heart failure: Secondary | ICD-10-CM

## 2016-05-30 DIAGNOSIS — I442 Atrioventricular block, complete: Secondary | ICD-10-CM | POA: Insufficient documentation

## 2016-05-30 DIAGNOSIS — R011 Cardiac murmur, unspecified: Secondary | ICD-10-CM | POA: Insufficient documentation

## 2016-05-30 DIAGNOSIS — I10 Essential (primary) hypertension: Secondary | ICD-10-CM

## 2016-05-30 DIAGNOSIS — I110 Hypertensive heart disease with heart failure: Secondary | ICD-10-CM | POA: Insufficient documentation

## 2016-05-30 LAB — HEPATIC FUNCTION PANEL
ALK PHOS: 54 U/L (ref 33–130)
ALT: 18 U/L (ref 6–29)
AST: 19 U/L (ref 10–35)
Albumin: 4 g/dL (ref 3.6–5.1)
BILIRUBIN DIRECT: 0.2 mg/dL (ref <=0.2)
BILIRUBIN INDIRECT: 0.4 mg/dL (ref 0.2–1.2)
BILIRUBIN TOTAL: 0.6 mg/dL (ref 0.2–1.2)
Total Protein: 6.2 g/dL (ref 6.1–8.1)

## 2016-05-30 LAB — LIPID PANEL
CHOL/HDL RATIO: 2.2 ratio (ref <=5.0)
Cholesterol: 120 mg/dL — ABNORMAL LOW (ref 125–200)
HDL: 55 mg/dL (ref >=46)
LDL CALC: 52 mg/dL (ref <130)
Triglycerides: 67 mg/dL (ref <150)
VLDL: 13 mg/dL (ref <30)

## 2016-05-30 LAB — BASIC METABOLIC PANEL
BUN: 15 mg/dL (ref 7–25)
CHLORIDE: 107 mmol/L (ref 98–110)
CO2: 23 mmol/L (ref 20–31)
Calcium: 9.9 mg/dL (ref 8.6–10.4)
Creat: 0.92 mg/dL — ABNORMAL HIGH (ref 0.60–0.88)
Glucose, Bld: 103 mg/dL — ABNORMAL HIGH (ref 65–99)
POTASSIUM: 4.5 mmol/L (ref 3.5–5.3)
SODIUM: 141 mmol/L (ref 135–146)

## 2016-06-06 ENCOUNTER — Ambulatory Visit (INDEPENDENT_AMBULATORY_CARE_PROVIDER_SITE_OTHER): Payer: Medicare Other | Admitting: *Deleted

## 2016-06-06 DIAGNOSIS — I442 Atrioventricular block, complete: Secondary | ICD-10-CM | POA: Diagnosis not present

## 2016-06-06 NOTE — Progress Notes (Signed)
Remote pacemaker transmission.   

## 2016-06-07 ENCOUNTER — Telehealth: Payer: Self-pay

## 2016-06-07 DIAGNOSIS — R931 Abnormal findings on diagnostic imaging of heart and coronary circulation: Secondary | ICD-10-CM

## 2016-06-07 DIAGNOSIS — I5032 Chronic diastolic (congestive) heart failure: Secondary | ICD-10-CM

## 2016-06-07 DIAGNOSIS — I270 Primary pulmonary hypertension: Secondary | ICD-10-CM

## 2016-06-07 MED ORDER — CARVEDILOL 12.5 MG PO TABS: 12.5000 mg | ORAL_TABLET | Freq: Two times a day (BID) | ORAL | Status: DC

## 2016-06-07 MED ORDER — SPIRONOLACTONE 25 MG PO TABS: 25.0000 mg | ORAL_TABLET | Freq: Every day | ORAL | Status: DC

## 2016-06-07 NOTE — Telephone Encounter (Signed)
Informed patient of results and verbal understanding expressed.   Instructed patient to INCREASE COREG to 12.5 mg BID. Instructed patient to START ALDACTONE 25 mg daily. Reviewed lexiscan instructions with patient and she has no questions. She understands she will have lab work the same day as her stress test. Chest CT ordered for scheduling. Patient agrees with treatment plan.

## 2016-06-07 NOTE — Telephone Encounter (Signed)
-----   Message from Sueanne Margarita, MD sent at 06/05/2016  8:23 PM EDT ----- Echo showed severe LV dysfunction with EF 20-25% with increased stiffness of heart muscle.  There is calcification of the AV but no stenosis and mild AR.  There is mild to moderate stenosis of the MV and moderate MR.  The RV moderately dilated and moderate to severely reduced in function.  There is severe pulmonary HTN.  Compared to prior echo, pulmonary HTN has worsened and LVF has significantly declined.  Please increase coreg to 12.5mg  BID, add aldactone 25mg  daily.  Please get a Lexiscan myoview to rule out ischemia as well as a chest CT angio to rule out PE given severe pulmoary HTN.  Please have her come in for a BNP

## 2016-06-08 ENCOUNTER — Telehealth: Payer: Self-pay | Admitting: Cardiology

## 2016-06-08 NOTE — Telephone Encounter (Signed)
Confirmed with patient she is to take Hydralazine 37.5 mg daily (1.5 of the 25 mg tablets). She was grateful for call.

## 2016-06-08 NOTE — Telephone Encounter (Signed)
New message     Pt c/o medication issue:  1. Name of Medication: hydralazine   2. How are you currently taking this medication (dosage and times per day)? Yes 25 mg tablets  3. Are you having a reaction (difficulty breathing--STAT)? No   4. What is your medication issue? Pt states she has two of the same prescription but the dosage is different. Pt would like clarity with the directions she need to be following. Please call back and advise. Pt states one bottle says 2 tables a day; other says 1 1/2 tablets 3x a day

## 2016-06-11 ENCOUNTER — Ambulatory Visit (INDEPENDENT_AMBULATORY_CARE_PROVIDER_SITE_OTHER)
Admission: RE | Admit: 2016-06-11 | Discharge: 2016-06-11 | Disposition: A | Discharge status: Home / Self Care | Payer: Medicare Other | Source: Ambulatory Visit | Attending: Cardiology | Admitting: Cardiology

## 2016-06-11 ENCOUNTER — Telehealth: Payer: Self-pay

## 2016-06-11 DIAGNOSIS — I272 Pulmonary hypertension, unspecified: Secondary | ICD-10-CM

## 2016-06-11 DIAGNOSIS — R931 Abnormal findings on diagnostic imaging of heart and coronary circulation: Secondary | ICD-10-CM

## 2016-06-11 DIAGNOSIS — I270 Primary pulmonary hypertension: Secondary | ICD-10-CM | POA: Diagnosis not present

## 2016-06-11 DIAGNOSIS — I5032 Chronic diastolic (congestive) heart failure: Secondary | ICD-10-CM

## 2016-06-11 MED ORDER — IOPAMIDOL (ISOVUE-370) INJECTION 76%
100.0000 mL | Freq: Once | INTRAVENOUS | Status: AC | PRN
  Administered 2016-06-11: 80 mL via INTRAVENOUS

## 2016-06-11 NOTE — Telephone Encounter (Signed)
Informed patient of results and verbal understanding expressed.  Referral for Dr. McLean placed. Patient agrees with treatment plan. 

## 2016-06-11 NOTE — Telephone Encounter (Signed)
-----   Message from Sueanne Margarita, MD sent at 06/11/2016 12:38 PM EDT ----- Please refer to Dr. Aundra Dubin for evaluation of worsening pulmonary HTN in setting of moderate MS s/p MV repair and now with LV and RV systolic dysfunction.  Probably needs right and left heart cath

## 2016-06-14 LAB — CUP PACEART REMOTE DEVICE CHECK
Brady Statistic AP VS Percent: 66 %
Brady Statistic AS VP Percent: 3 %
Brady Statistic AS VS Percent: 12 %
Date Time Interrogation Session: 20170607130842
Implantable Lead Location: 753859
Implantable Lead Model: 5076
Lead Channel Pacing Threshold Amplitude: 0.75 V
Lead Channel Pacing Threshold Pulse Width: 0.4 ms
Lead Channel Pacing Threshold Pulse Width: 0.4 ms
Lead Channel Setting Sensing Sensitivity: 4 mV
MDC IDC LEAD IMPLANT DT: 20140730
MDC IDC LEAD IMPLANT DT: 20140730
MDC IDC LEAD LOCATION: 753860
MDC IDC MSMT BATTERY IMPEDANCE: 135 Ohm
MDC IDC MSMT BATTERY REMAINING LONGEVITY: 127 mo
MDC IDC MSMT BATTERY VOLTAGE: 2.79 V
MDC IDC MSMT LEADCHNL RA IMPEDANCE VALUE: 396 Ohm
MDC IDC MSMT LEADCHNL RA PACING THRESHOLD AMPLITUDE: 0.625 V
MDC IDC MSMT LEADCHNL RV IMPEDANCE VALUE: 404 Ohm
MDC IDC MSMT LEADCHNL RV SENSING INTR AMPL: 8 mV
MDC IDC SET LEADCHNL RA PACING AMPLITUDE: 2 V
MDC IDC SET LEADCHNL RV PACING AMPLITUDE: 2.5 V
MDC IDC SET LEADCHNL RV PACING PULSEWIDTH: 0.4 ms
MDC IDC STAT BRADY AP VP PERCENT: 18 %

## 2016-06-14 NOTE — Telephone Encounter (Signed)
Patient is scheduled 7/17 in the [facility].

## 2016-06-14 NOTE — Telephone Encounter (Signed)
Myoview is scheduled 6/26.

## 2016-06-20 ENCOUNTER — Encounter: Payer: Self-pay | Admitting: Cardiology

## 2016-06-20 ENCOUNTER — Telehealth (HOSPITAL_COMMUNITY): Payer: Self-pay | Admitting: *Deleted

## 2016-06-20 NOTE — Telephone Encounter (Signed)
Patient given detailed instructions per Myocardial Perfusion Study Information Sheet for the test on 06/25/16 Patient notified to arrive 15 minutes early and that it is imperative to arrive on time for appointment to keep from having the test rescheduled.  If you need to cancel or reschedule your appointment, please call the office within 24 hours of your appointment. Failure to do so may result in a cancellation of your appointment, and a $50 no show fee. Patient verbalized understanding. Hubbard Robinson, RN

## 2016-06-25 ENCOUNTER — Ambulatory Visit (HOSPITAL_COMMUNITY): Payer: Medicare Other | Attending: Internal Medicine

## 2016-06-25 ENCOUNTER — Other Ambulatory Visit (INDEPENDENT_AMBULATORY_CARE_PROVIDER_SITE_OTHER): Payer: Medicare Other | Admitting: *Deleted

## 2016-06-25 DIAGNOSIS — I110 Hypertensive heart disease with heart failure: Secondary | ICD-10-CM | POA: Diagnosis not present

## 2016-06-25 DIAGNOSIS — I5032 Chronic diastolic (congestive) heart failure: Secondary | ICD-10-CM

## 2016-06-25 DIAGNOSIS — R931 Abnormal findings on diagnostic imaging of heart and coronary circulation: Secondary | ICD-10-CM | POA: Diagnosis not present

## 2016-06-25 DIAGNOSIS — R079 Chest pain, unspecified: Secondary | ICD-10-CM | POA: Diagnosis not present

## 2016-06-25 DIAGNOSIS — R9439 Abnormal result of other cardiovascular function study: Secondary | ICD-10-CM | POA: Diagnosis not present

## 2016-06-25 DIAGNOSIS — R0609 Other forms of dyspnea: Secondary | ICD-10-CM | POA: Diagnosis not present

## 2016-06-25 LAB — MYOCARDIAL PERFUSION IMAGING
CHL CUP NUCLEAR SDS: 7
CHL CUP NUCLEAR SRS: 6
CHL CUP NUCLEAR SSS: 13
CHL CUP RESTING HR STRESS: 84 {beats}/min
CHL CUP STRESS STAGE 1 DBP: 82 mmHg
CHL CUP STRESS STAGE 1 HR: 80 {beats}/min
CHL CUP STRESS STAGE 1 SBP: 157 mmHg
CHL CUP STRESS STAGE 2 HR: 79 {beats}/min
CHL CUP STRESS STAGE 3 GRADE: 0 %
CHL CUP STRESS STAGE 3 SPEED: 0 mph
CHL CUP STRESS STAGE 5 GRADE: 0 %
CHL CUP STRESS STAGE 5 HR: 71 {beats}/min
CHL CUP STRESS STAGE 5 SBP: 145 mmHg
CHL CUP STRESS STAGE 5 SPEED: 0 mph
CHL CUP STRESS STAGE 6 HR: 68 {beats}/min
CHL CUP STRESS STAGE 6 SBP: 144 mmHg
CSEPPMHR: 55 %
Estimated workload: 1 METS
LHR: 0.39
LV sys vol: 104 mL
LVDIAVOL: 169 mL (ref 46–106)
Peak HR: 75 {beats}/min
Stage 1 Grade: 0 %
Stage 1 Speed: 0 mph
Stage 2 Grade: 0 %
Stage 2 Speed: 0 mph
Stage 3 DBP: 80 mmHg
Stage 3 HR: 71 {beats}/min
Stage 3 SBP: 161 mmHg
Stage 4 Grade: 0 %
Stage 4 HR: 75 {beats}/min
Stage 4 Speed: 0 mph
Stage 5 DBP: 64 mmHg
Stage 6 DBP: 62 mmHg
Stage 6 Grade: 0 %
Stage 6 Speed: 0 mph
TID: 0.88

## 2016-06-25 LAB — BRAIN NATRIURETIC PEPTIDE: Brain Natriuretic Peptide: 468.6 pg/mL — ABNORMAL HIGH (ref <100)

## 2016-06-25 MED ORDER — REGADENOSON 0.4 MG/5ML IV SOLN
0.4000 mg | Freq: Once | INTRAVENOUS | Status: AC
  Administered 2016-06-25: 0.4 mg via INTRAVENOUS

## 2016-06-25 MED ORDER — TECHNETIUM TC 99M TETROFOSMIN IV KIT
10.3000 | PACK | Freq: Once | INTRAVENOUS | Status: AC | PRN
  Administered 2016-06-25: 10 via INTRAVENOUS
  Filled 2016-06-25: qty 10

## 2016-06-25 MED ORDER — TECHNETIUM TC 99M TETROFOSMIN IV KIT
30.7000 | PACK | Freq: Once | INTRAVENOUS | Status: AC | PRN
  Administered 2016-06-25: 30.7 via INTRAVENOUS
  Filled 2016-06-25: qty 31

## 2016-07-11 ENCOUNTER — Encounter: Payer: Self-pay | Admitting: Nurse Practitioner

## 2016-07-11 ENCOUNTER — Other Ambulatory Visit: Payer: Self-pay

## 2016-07-11 MED ORDER — SIMVASTATIN 40 MG PO TABS: 40.0000 mg | ORAL_TABLET | Freq: Every day | ORAL | Status: DC

## 2016-07-16 ENCOUNTER — Encounter: Payer: Self-pay | Admitting: Internal Medicine

## 2016-07-16 ENCOUNTER — Ambulatory Visit (HOSPITAL_COMMUNITY)
Admission: RE | Admit: 2016-07-16 | Discharge: 2016-07-16 | Disposition: A | Discharge status: Home / Self Care | Payer: Medicare Other | Source: Ambulatory Visit | Attending: Cardiology | Admitting: Cardiology

## 2016-07-16 VITALS — BP 148/76 | HR 82 | Wt 131.2 lb

## 2016-07-16 DIAGNOSIS — I6529 Occlusion and stenosis of unspecified carotid artery: Secondary | ICD-10-CM | POA: Diagnosis not present

## 2016-07-16 DIAGNOSIS — Z8673 Personal history of transient ischemic attack (TIA), and cerebral infarction without residual deficits: Secondary | ICD-10-CM | POA: Insufficient documentation

## 2016-07-16 DIAGNOSIS — I130 Hypertensive heart and chronic kidney disease with heart failure and stage 1 through stage 4 chronic kidney disease, or unspecified chronic kidney disease: Secondary | ICD-10-CM | POA: Insufficient documentation

## 2016-07-16 DIAGNOSIS — I2510 Atherosclerotic heart disease of native coronary artery without angina pectoris: Secondary | ICD-10-CM | POA: Diagnosis not present

## 2016-07-16 DIAGNOSIS — E1140 Type 2 diabetes mellitus with diabetic neuropathy, unspecified: Secondary | ICD-10-CM | POA: Diagnosis not present

## 2016-07-16 DIAGNOSIS — I442 Atrioventricular block, complete: Secondary | ICD-10-CM | POA: Diagnosis not present

## 2016-07-16 DIAGNOSIS — Z9889 Other specified postprocedural states: Secondary | ICD-10-CM | POA: Insufficient documentation

## 2016-07-16 DIAGNOSIS — I5022 Chronic systolic (congestive) heart failure: Secondary | ICD-10-CM | POA: Insufficient documentation

## 2016-07-16 DIAGNOSIS — E1122 Type 2 diabetes mellitus with diabetic chronic kidney disease: Secondary | ICD-10-CM | POA: Diagnosis not present

## 2016-07-16 DIAGNOSIS — I5032 Chronic diastolic (congestive) heart failure: Secondary | ICD-10-CM

## 2016-07-16 DIAGNOSIS — I340 Nonrheumatic mitral (valve) insufficiency: Secondary | ICD-10-CM | POA: Insufficient documentation

## 2016-07-16 DIAGNOSIS — N189 Chronic kidney disease, unspecified: Secondary | ICD-10-CM | POA: Diagnosis not present

## 2016-07-16 DIAGNOSIS — Z7982 Long term (current) use of aspirin: Secondary | ICD-10-CM | POA: Insufficient documentation

## 2016-07-16 DIAGNOSIS — E785 Hyperlipidemia, unspecified: Secondary | ICD-10-CM | POA: Insufficient documentation

## 2016-07-16 DIAGNOSIS — Z952 Presence of prosthetic heart valve: Secondary | ICD-10-CM | POA: Insufficient documentation

## 2016-07-16 DIAGNOSIS — Z951 Presence of aortocoronary bypass graft: Secondary | ICD-10-CM | POA: Diagnosis not present

## 2016-07-16 MED ORDER — SACUBITRIL-VALSARTAN 24-26 MG PO TABS: 1.0000 | ORAL_TABLET | Freq: Two times a day (BID) | ORAL | Status: DC

## 2016-07-16 NOTE — Patient Instructions (Signed)
Stop Hydralazine  Start Entresto 24/26 mg Twice daily   Labs on Wed 7/26 at Dr Theodosia Blender office, anytime that morning  Your physician recommends that you schedule a follow-up appointment in: 1 month

## 2016-07-16 NOTE — Progress Notes (Signed)
Patient ID: [name], female   DOB: 01-13-1928, 82 y.o.   MRN: [id number] PCP: Dr Raliegh Ip. [place] [facility]: Dr. Radford Pax HF Cardiology: Dr. Aundra Dubin  82 yo with history of CAD s/p CABG, mitral regurgitation s/p MV repair, ischemic cardiomyopathy, complete heart block, and carotid stenosis presents for CHF clinic evaluation.  She had CABG + MVR in 2008.  She developed ischemic cardiomyopathy and had a Medtronic ICD placed.  However, by 2014, EF had increased back to 55%.  She developed complete heart block and the ICD was replaced with a dual chamber PPM.  Recently, she had a repeat echo due to louder murmur and EF was noted to have decreased to 20-25% with severe LV dilation, RV dysfunction, and severe pulmonary hypertension.  She had moderate MR (in setting of repaired mitral valve) and probably mild mitral stenosis.  Because of the fall in EF, she had a Cardiolite which showed no perfusion abnormalities.  She was referred to CHF clinic for evaluation and consideration of cardiac cath.   She lives by herself although family is around to help.  Her main complaint is actually neuropathy symptoms in her fingers (numb and tingling) in both hands.  She is a bit unsteady on her feet so uses a cane or walker. She can walk about 1/2 block before becoming short of breath.  This is chronic with no change.  No orthopnea/PND, no chest pain, no palpitations.  She does not take Lasix.  Weight has been stable.   ECG: a-paced, LBBB  MDT PPM interrogation: 20% RV pacing, unable to adjust to decrease this further.   Labs (5/17): K 4.5, creatinine 0.92, LDL 52 Labs (6/17): BNP 467  PMH: 1. Carotid disease: Totally occluded RICA, followed by Dr. Donnetta Hutching.  2. Type II diabetes 3. Hyperlipidemia 4. HTN 5. Mitral regurgitation: S/p mitral valve repair with CABG in 2008.  6. CAD: CABG x 3 + MV repair in 2008.  Cardiolite (6/17) with EF 38%, no perfusion abnormality.  7. Chronic systolic CHF: Ischemic cardiomyopathy.  She had  a Medtronic ICD, then EF improved and this was replaced with a dual chamber Medtronic PPM when she later developed CHB. - Echo (2014) with EF 55%.  - Echo (5/17): severe LV dilation, EF 20-25%, diffuse hypokinesis, grade II diastolic dysfunction, mild AI, s/p MV repair with moderate MR and probably mild stenosis (mean gradient 7 mmHg but pressure halftime not elevated), RV moderately dilated with moderate-severe RV dysfunction, PASP 83 mmHg.   8. Complete heart block: Dual chamber Medtronic PPM.  9. S/p CVA 10. CKD 11. Neuropathy.  12. S/p THR  Social History   Social History  . Marital Status: Widowed    Spouse Name: N/A  . Number of Children: N/A  . Years of Education: N/A   Occupational History  . retired Marine scientist    Social History Main Topics  . Smoking status: Never Smoker   . Smokeless tobacco: Never Used  . Alcohol Use: No  . Drug Use: No  . Sexual Activity: No   Other Topics Concern  . Not on file   Social History Narrative   Family History  Problem Relation Age of Onset  . Coronary artery disease      family hx of  . Hypertension      family hx of  . Diabetes      family hx of  . Cancer Mother     Theadora Rama   . Heart disease Mother     Before  age 26  . Heart disease Sister     Before age 3-  CABG  . Cancer Sister     Lonia Chimera  . Diabetes Sister   . Hyperlipidemia Sister   . Heart attack Sister   . Diabetes Daughter    ROS: All systems reviewed and negative except as per HPI.   Current Outpatient Prescriptions  Medication Sig Dispense Refill  . acetaminophen (TYLENOL) 500 MG tablet Take 500 mg by mouth daily as needed (pain).     Marland Kitchen aspirin EC 81 MG tablet Take 81 mg by mouth every morning.    . carvedilol (COREG) 12.5 MG tablet Take 6.25 mg by mouth 2 (two) times daily with a meal.    . Cholecalciferol (VITAMIN D) 2000 UNITS tablet Take 2,000 Units by mouth daily.     . fish oil-omega-3 fatty acids 1000 MG capsule Take 1 g by mouth 2 (two) times daily.      Marland Kitchen glipiZIDE (GLUCOTROL XL) 10 MG 24 hr tablet Take 20 mg by mouth daily.    . Multiple Vitamins-Minerals (CENTRUM SILVER PO) Take 1 tablet by mouth daily.     Marland Kitchen oxybutynin (DITROPAN) 5 MG tablet Take 1 tablet by mouth 2 (two) times daily.  3  . simvastatin (ZOCOR) 40 MG tablet Take 1 tablet (40 mg total) by mouth at bedtime. 30 tablet 6  . spironolactone (ALDACTONE) 25 MG tablet Take 1 tablet (25 mg total) by mouth daily. 90 tablet 3  . vitamin B-12 (CYANOCOBALAMIN) 1000 MCG tablet Take 1,000 mcg by mouth daily.    . sacubitril-valsartan (ENTRESTO) 24-26 MG Take 1 tablet by mouth 2 (two) times daily. 60 tablet 3   No current facility-administered medications for this encounter.   BP 148/76 mmHg  Pulse 82  Wt 131 lb 4 oz (59.535 kg)  SpO2 97% General: NAD Neck: No JVD, no thyromegaly or thyroid nodule.  Lungs: Clear to auscultation bilaterally with normal respiratory effort. CV: Nondisplaced PMI.  Heart regular S1/S2, no S3/S4, 3/6 HSM apex.  No peripheral edema.  No carotid bruit.  Normal pedal pulses.  Abdomen: Soft, nontender, no hepatosplenomegaly, no distention.  Skin: Intact without lesions or rashes.  Neurologic: Alert and oriented x 3.  Psych: Normal affect. Extremities: No clubbing or cyanosis.  HEENT: Normal.   Assessment/Plan: 1. Chronic systolic CHF: Ischemic cardiomyopathy.  Long-standing.  EF had improved to 55% by 2014 and ICD was removed and replaced with dual chamber PPM in the setting of CHB.  However, recently echo was done for increased murmur and decline in EF to 20-25% with RV dysfunction was noted.  She has had no chest pain and no change her in chronic NYHA class II-III symptoms.  She is not volume overloaded on exam.  It is certainly possible that EF has fallen with worsening coronary disease though no perfusion defects were noted on recent Cardiolite.   - We discussed in detail her recent studies. She tells me that she does not want any further procedures done  unless it is an emergency.  I think that it is reasonable to hold off on cardiac catheterization given lack of symptomatic change, especially with her advanced age.  She is willing to adjust her medications as needed.  - Stop hydralazine and start Entresto 24/26 bid.  BMET 10 days.  - Continue Coreg and spironolactone at current doses.  - She is not volume overloaded on exam, I do not think that she needs Lasix.  - She is RV  pacing about 20% of the time.  We were not able to reprogram in a way that would lower this total appreciably.  I would avoid upgrading to CRT as likely only marginal benefit unless she were to start pacing more.  Regardless, she does not want any more procedures.  2. CAD: Stable with no chest pain.  EF has fallen recently but Cardiolite showed no ischemia.  She wants to avoid procedures so will hold off on cath.  - Continue ASA 81 daily and statin.  3. Carotid stenosis: RICA is occluded, followed by Dr Donnetta Hutching.  4. Complete heart block: MDT PPM.  5. Mitral regurgitation: S/p MV repair.  Moderate MR on last echo with probably mild MS (gradient elevated but pressure halftime is not).    Followup in 1 month.   Loralie Champagne 07/16/2016

## 2016-07-25 ENCOUNTER — Other Ambulatory Visit (INDEPENDENT_AMBULATORY_CARE_PROVIDER_SITE_OTHER): Payer: Medicare Other

## 2016-07-25 DIAGNOSIS — I5032 Chronic diastolic (congestive) heart failure: Secondary | ICD-10-CM | POA: Diagnosis not present

## 2016-07-25 LAB — BASIC METABOLIC PANEL
BUN: 24 mg/dL (ref 7–25)
CO2: 22 mmol/L (ref 20–31)
Calcium: 9.4 mg/dL (ref 8.6–10.4)
Chloride: 107 mmol/L (ref 98–110)
Creat: 1.23 mg/dL — ABNORMAL HIGH (ref 0.60–0.88)
Glucose, Bld: 168 mg/dL — ABNORMAL HIGH (ref 65–99)
POTASSIUM: 4.7 mmol/L (ref 3.5–5.3)
SODIUM: 139 mmol/L (ref 135–146)

## 2016-08-28 ENCOUNTER — Ambulatory Visit (HOSPITAL_COMMUNITY)
Admission: RE | Admit: 2016-08-28 | Discharge: 2016-08-28 | Disposition: A | Discharge status: Home / Self Care | Payer: Medicare Other | Source: Ambulatory Visit | Attending: Cardiology | Admitting: Cardiology

## 2016-08-28 ENCOUNTER — Encounter (HOSPITAL_COMMUNITY): Payer: Self-pay

## 2016-08-28 VITALS — BP 126/60 | HR 78 | Wt 129.8 lb

## 2016-08-28 DIAGNOSIS — I5032 Chronic diastolic (congestive) heart failure: Secondary | ICD-10-CM

## 2016-08-28 DIAGNOSIS — I5022 Chronic systolic (congestive) heart failure: Secondary | ICD-10-CM

## 2016-08-28 DIAGNOSIS — Z950 Presence of cardiac pacemaker: Secondary | ICD-10-CM | POA: Diagnosis not present

## 2016-08-28 DIAGNOSIS — E1140 Type 2 diabetes mellitus with diabetic neuropathy, unspecified: Secondary | ICD-10-CM | POA: Insufficient documentation

## 2016-08-28 DIAGNOSIS — Z951 Presence of aortocoronary bypass graft: Secondary | ICD-10-CM | POA: Diagnosis not present

## 2016-08-28 DIAGNOSIS — I2510 Atherosclerotic heart disease of native coronary artery without angina pectoris: Secondary | ICD-10-CM | POA: Diagnosis not present

## 2016-08-28 DIAGNOSIS — N189 Chronic kidney disease, unspecified: Secondary | ICD-10-CM | POA: Diagnosis not present

## 2016-08-28 DIAGNOSIS — I130 Hypertensive heart and chronic kidney disease with heart failure and stage 1 through stage 4 chronic kidney disease, or unspecified chronic kidney disease: Secondary | ICD-10-CM | POA: Insufficient documentation

## 2016-08-28 DIAGNOSIS — I255 Ischemic cardiomyopathy: Secondary | ICD-10-CM | POA: Insufficient documentation

## 2016-08-28 DIAGNOSIS — Z833 Family history of diabetes mellitus: Secondary | ICD-10-CM | POA: Insufficient documentation

## 2016-08-28 DIAGNOSIS — Z7982 Long term (current) use of aspirin: Secondary | ICD-10-CM | POA: Diagnosis not present

## 2016-08-28 DIAGNOSIS — I6521 Occlusion and stenosis of right carotid artery: Secondary | ICD-10-CM | POA: Diagnosis not present

## 2016-08-28 DIAGNOSIS — Z8249 Family history of ischemic heart disease and other diseases of the circulatory system: Secondary | ICD-10-CM | POA: Diagnosis not present

## 2016-08-28 DIAGNOSIS — Z8673 Personal history of transient ischemic attack (TIA), and cerebral infarction without residual deficits: Secondary | ICD-10-CM | POA: Diagnosis not present

## 2016-08-28 DIAGNOSIS — I447 Left bundle-branch block, unspecified: Secondary | ICD-10-CM | POA: Diagnosis not present

## 2016-08-28 DIAGNOSIS — Z79899 Other long term (current) drug therapy: Secondary | ICD-10-CM | POA: Insufficient documentation

## 2016-08-28 DIAGNOSIS — I442 Atrioventricular block, complete: Secondary | ICD-10-CM | POA: Diagnosis not present

## 2016-08-28 DIAGNOSIS — E1122 Type 2 diabetes mellitus with diabetic chronic kidney disease: Secondary | ICD-10-CM | POA: Diagnosis not present

## 2016-08-28 LAB — BASIC METABOLIC PANEL
Anion gap: 7 (ref 5–15)
BUN: 18 mg/dL (ref 6–20)
CHLORIDE: 108 mmol/L (ref 101–111)
CO2: 24 mmol/L (ref 22–32)
CREATININE: 1.16 mg/dL — AB (ref 0.44–1.00)
Calcium: 9.8 mg/dL (ref 8.9–10.3)
GFR calc non Af Amer: 41 mL/min — ABNORMAL LOW (ref >60)
GFR, EST AFRICAN AMERICAN: 48 mL/min — AB (ref >60)
Glucose, Bld: 169 mg/dL — ABNORMAL HIGH (ref 65–99)
POTASSIUM: 4.8 mmol/L (ref 3.5–5.1)
SODIUM: 139 mmol/L (ref 135–145)

## 2016-08-28 LAB — BRAIN NATRIURETIC PEPTIDE: B NATRIURETIC PEPTIDE 5: 456 pg/mL — AB (ref 0.0–100.0)

## 2016-08-28 NOTE — Patient Instructions (Signed)
Labs today  We will contact you in 2 months to schedule your next appointment.  

## 2016-08-29 ENCOUNTER — Encounter: Payer: Medicare Other | Admitting: Nurse Practitioner

## 2016-08-29 ENCOUNTER — Telehealth (HOSPITAL_COMMUNITY): Payer: Self-pay | Admitting: Pharmacist

## 2016-08-29 NOTE — Progress Notes (Signed)
Patient ID: Alyssa TRUMBAUER, female   DOB: 02-16-1928, 82 y.o.   MRN: [id number] PCP: Dr Raliegh Ip. [place] Cardiology: Dr. Radford Pax HF Cardiology: Dr. Aundra Dubin  82 yo with history of CAD s/p CABG, mitral regurgitation s/p MV repair, ischemic cardiomyopathy, complete heart block, and carotid stenosis presents for CHF clinic evaluation.  She had CABG + MVR in 2008.  She developed ischemic cardiomyopathy and had a Medtronic ICD placed.  However, by 2014, EF had increased back to 55%.  She developed complete heart block and the ICD was replaced with a dual chamber PPM.  Recently, she had a repeat echo due to louder murmur and EF was noted to have decreased to 20-25% with severe LV dilation, RV dysfunction, and severe pulmonary hypertension.  She had moderate MR (in setting of repaired mitral valve) and probably mild mitral stenosis.  Because of the fall in EF, she had a Cardiolite which showed no perfusion abnormalities.  She was referred to CHF clinic for evaluation and consideration of cardiac cath.   She lives by herself although family is around to help.  Her main complaint is actually neuropathy symptoms in her fingers (numb and tingling) in both hands.  She is a bit unsteady on her feet so uses a cane or walker.  Mild lightheadedness with standing.  She can walk about 1/2 block before becoming short of breath.  This is chronic with no change.  No orthopnea/PND, no chest pain, no palpitations.  She does not take Lasix.  Weight is down 2 lbs..   ECG: a-paced, LBBB 150 msec  Labs (5/17): K 4.5, creatinine 0.92, LDL 52 Labs (6/17): BNP 467 Labs (7/17): K 4.7, creatinine 1.23  PMH: 1. Carotid disease: Totally occluded RICA, followed by Dr. Donnetta Hutching.  2. Type II diabetes 3. Hyperlipidemia 4. HTN 5. Mitral regurgitation: S/p mitral valve repair with CABG in 2008.  6. CAD: CABG x 3 + MV repair in 2008.  Cardiolite (6/17) with EF 38%, no perfusion abnormality.  7. Chronic systolic CHF: Ischemic cardiomyopathy.  She had a  Medtronic ICD, then EF improved and this was replaced with a dual chamber Medtronic PPM when she later developed CHB. - Echo (2014) with EF 55%.  - Echo (5/17): severe LV dilation, EF 20-25%, diffuse hypokinesis, grade II diastolic dysfunction, mild AI, s/p MV repair with moderate MR and probably mild stenosis (mean gradient 7 mmHg but pressure halftime not elevated), RV moderately dilated with moderate-severe RV dysfunction, PASP 83 mmHg.   8. Complete heart block: Dual chamber Medtronic PPM.  9. S/p CVA 10. CKD 11. Neuropathy.  12. S/p THR  Social History   Social History  . Marital status: Widowed    Spouse name: N/A  . Number of children: N/A  . Years of education: N/A   Occupational History  . retired Marine scientist    Social History Main Topics  . Smoking status: Never Smoker  . Smokeless tobacco: Never Used  . Alcohol use No  . Drug use: No  . Sexual activity: No   Other Topics Concern  . Not on file   Social History Narrative  . No narrative on file   Family History  Problem Relation Age of Onset  . Coronary artery disease      family hx of  . Hypertension      family hx of  . Diabetes      family hx of  . Cancer Mother     Theadora Rama   . Heart disease Mother  Before age 78  . Heart disease Sister     Before age 72-  CABG  . Cancer Sister     Lonia Chimera  . Diabetes Sister   . Hyperlipidemia Sister   . Heart attack Sister   . Diabetes Daughter    ROS: All systems reviewed and negative except as per HPI.   Current Outpatient Prescriptions  Medication Sig Dispense Refill  . acetaminophen (TYLENOL) 500 MG tablet Take 500 mg by mouth daily as needed (pain).     Marland Kitchen aspirin EC 81 MG tablet Take 81 mg by mouth every morning.    . carvedilol (COREG) 12.5 MG tablet Take 6.25 mg by mouth 2 (two) times daily with a meal.    . Cholecalciferol (VITAMIN D) 2000 UNITS tablet Take 2,000 Units by mouth daily.     . fish oil-omega-3 fatty acids 1000 MG capsule Take 1 g by mouth  2 (two) times daily.     Marland Kitchen glipiZIDE (GLUCOTROL XL) 10 MG 24 hr tablet Take 20 mg by mouth daily.    Marland Kitchen oxybutynin (DITROPAN) 5 MG tablet Take 1 tablet by mouth 2 (two) times daily.  3  . sacubitril-valsartan (ENTRESTO) 24-26 MG Take 1 tablet by mouth 2 (two) times daily. 60 tablet 3  . simvastatin (ZOCOR) 40 MG tablet Take 1 tablet (40 mg total) by mouth at bedtime. 30 tablet 6  . spironolactone (ALDACTONE) 25 MG tablet Take 1 tablet (25 mg total) by mouth daily. 90 tablet 3  . vitamin B-12 (CYANOCOBALAMIN) 1000 MCG tablet Take 1,000 mcg by mouth daily.    . Multiple Vitamins-Minerals (CENTRUM SILVER PO) Take 1 tablet by mouth daily.      No current facility-administered medications for this encounter.    BP 126/60   Pulse 78   Wt 129 lb 12 oz (58.9 kg)   SpO2 98%   BMI 23.73 kg/m  General: NAD Neck: No JVD, no thyromegaly or thyroid nodule.  Lungs: Clear to auscultation bilaterally with normal respiratory effort. CV: Nondisplaced PMI.  Heart regular S1/S2, no S3/S4, 3/6 HSM apex.  No peripheral edema.  No carotid bruit.  Normal pedal pulses.  Abdomen: Soft, nontender, no hepatosplenomegaly, no distention.  Skin: Intact without lesions or rashes.  Neurologic: Alert and oriented x 3.  Psych: Normal affect. Extremities: No clubbing or cyanosis.  HEENT: Normal.   Assessment/Plan: 1. Chronic systolic CHF: Ischemic cardiomyopathy.  Long-standing.  EF had improved to 55% by 2014 and ICD was removed and replaced with dual chamber PPM in the setting of CHB.  However, recently echo was done for increased murmur and decline in EF to 20-25% with RV dysfunction was noted.  She has had no chest pain and no change her in chronic NYHA class II-III symptoms.  She is not volume overloaded on exam.  It is certainly possible that EF has fallen with worsening coronary disease though no perfusion defects were noted on recent Cardiolite.   -  I think that it is reasonable to hold off on cardiac  catheterization given lack of symptomatic change, especially with her advanced age.  She is willing to adjust her medications as needed.  - She has tolerated Entresto.  Will not increase dose today given lightheadedness with standing.  BMET/BNP today.  - Continue Coreg and spironolactone at current doses.  - She is not volume overloaded on exam, I do not think that she needs Lasix.  - At last appointment, she was noted to be RV pacing about  20% of the time.  We were not able to reprogram in a way that would lower this total appreciably.  - She does have a LBBB with QRS around 150 msec.  She would be willing to consider CRT upgrade, will discuss with Dr Rayann Heman.  2. CAD: Stable with no chest pain.  EF has fallen recently but Cardiolite showed no ischemia.  She wants to avoid procedures so will hold off on cath.  - Continue ASA 81 daily and statin.  3. Carotid stenosis: RICA is occluded, followed by Dr Donnetta Hutching.  4. Complete heart block: MDT PPM.  5. Mitral regurgitation: S/p MV repair.  Moderate MR on last echo with probably mild MS (gradient elevated but pressure halftime is not).    Followup in 2 months.   Loralie Champagne 08/29/2016

## 2016-08-29 NOTE — Telephone Encounter (Signed)
Patient expressed concerns with cost of Entresto ($45/mo) so have enrolled her in [employer] so that she will have $800 toward her copay costs through 08/28/2017. Relayed info to [facility] on [address].   Billing ID: [id number] Person Code: [id number] RX Group: [id number] RX BIN: [id number] PCN for Part D: [id number]    Ruta Hinds. Velva Harman, PharmD, BCPS, CPP Clinical Pharmacist Pager: [phone] Phone: [phone] 08/29/2016 10:23 AM

## 2016-09-05 ENCOUNTER — Encounter: Payer: Self-pay | Admitting: Internal Medicine

## 2016-09-10 ENCOUNTER — Encounter: Payer: Medicare Other | Admitting: Nurse Practitioner

## 2016-09-10 ENCOUNTER — Encounter: Payer: Self-pay | Admitting: Internal Medicine

## 2016-09-10 ENCOUNTER — Ambulatory Visit (INDEPENDENT_AMBULATORY_CARE_PROVIDER_SITE_OTHER): Payer: Medicare Other | Admitting: Internal Medicine

## 2016-09-10 VITALS — BP 126/68 | HR 80 | Ht 62.0 in | Wt 127.6 lb

## 2016-09-10 DIAGNOSIS — I5022 Chronic systolic (congestive) heart failure: Secondary | ICD-10-CM

## 2016-09-10 DIAGNOSIS — I10 Essential (primary) hypertension: Secondary | ICD-10-CM

## 2016-09-10 DIAGNOSIS — I442 Atrioventricular block, complete: Secondary | ICD-10-CM

## 2016-09-10 LAB — CUP PACEART INCLINIC DEVICE CHECK
Battery Impedance: 159 Ohm
Battery Remaining Longevity: 124 mo
Battery Voltage: 2.79 V
Brady Statistic AP VP Percent: 11 %
Brady Statistic AP VS Percent: 88 %
Brady Statistic AS VP Percent: 0 %
Date Time Interrogation Session: 20170911120502
Implantable Lead Location: 753859
Implantable Lead Model: 5076
Implantable Lead Model: 5076
Lead Channel Impedance Value: 461 Ohm
Lead Channel Pacing Threshold Amplitude: 0.5 V
Lead Channel Pacing Threshold Pulse Width: 0.4 ms
Lead Channel Pacing Threshold Pulse Width: 0.4 ms
Lead Channel Pacing Threshold Pulse Width: 0.4 ms
Lead Channel Pacing Threshold Pulse Width: 0.4 ms
Lead Channel Sensing Intrinsic Amplitude: 2 mV
Lead Channel Setting Pacing Amplitude: 2.5 V
Lead Channel Setting Sensing Sensitivity: 5.6 mV
MDC IDC LEAD IMPLANT DT: 20140730
MDC IDC LEAD IMPLANT DT: 20140730
MDC IDC LEAD LOCATION: 753860
MDC IDC MSMT LEADCHNL RA IMPEDANCE VALUE: 465 Ohm
MDC IDC MSMT LEADCHNL RA PACING THRESHOLD AMPLITUDE: 0.75 V
MDC IDC MSMT LEADCHNL RV PACING THRESHOLD AMPLITUDE: 0.625 V
MDC IDC MSMT LEADCHNL RV PACING THRESHOLD AMPLITUDE: 0.75 V
MDC IDC MSMT LEADCHNL RV SENSING INTR AMPL: 11.2 mV
MDC IDC SET LEADCHNL RA PACING AMPLITUDE: 2 V
MDC IDC SET LEADCHNL RV PACING PULSEWIDTH: 0.4 ms
MDC IDC STAT BRADY AS VS PERCENT: 1 %

## 2016-09-10 NOTE — Patient Instructions (Signed)
Medication Instructions:  Your physician recommends that you continue on your current medications as directed. Please refer to the Current Medication list given to you today.   Labwork: None ordered   Testing/Procedures: None ordered   Follow-Up: Your physician wants you to follow-up in: 12 months with [place] will receive a reminder letter in the mail two months in advance. If you don't receive a letter, please call our office to schedule the follow-up appointment.   Remote monitoring is used to monitor your Pacemaker from home. This monitoring reduces the number of office visits required to check your device to one time per year. It allows [place] to keep an eye on the functioning of your device to ensure it is working properly. You are scheduled for a device check from home on 12/10/16. You may send your transmission at any time that day. If you have a wireless device, the transmission will be sent automatically. After your physician reviews your transmission, you will receive a postcard with your next transmission date.    Any Other Special Instructions Will Be Listed Below (If Applicable).     If you need a refill on your cardiac medications before your next appointment, please call your pharmacy.

## 2016-09-10 NOTE — Progress Notes (Signed)
Electrophysiology Office Note   Date:  09/10/2016   ID:  [name], DOB [birth year], MRN [id number]  PCP:  Mayra Neer, MD  Cardiologist:  Dr Radford Pax Primary Electrophysiologist: Thompson Grayer, MD    Chief Complaint  Patient presents with  . Follow-up    Complete heart block     History of Present Illness: [name] is a 82 y.o. female who presents today for electrophysiology evaluation.   Doing well at this time.  She has occasional postural dizziness.  She is not very active.  She states "I am as wobbly as I can be when I try to walk".   She states "Dr Aundra Dubin said I needed surgery on my pacemaker and I have been worried ever since". Though she has some SOB, she is mostly limited by unsteadiness. Today, she denies symptoms of palpitations, chest pain, orthopnea, PND, lower extremity edema, claudication, presyncope, syncope, bleeding, or neurologic sequela. The patient is tolerating medications without difficulties and is otherwise without complaint today.    Past Medical History:  Diagnosis Date  . Acute on chronic systolic heart failure (HCC)    secondary to complete heart block  . Anxiety    "when I come to the hospital; don't take any RX" (07/29/2013)  . Arthritis    "left wrist; back" (07/29/2013)  . Automatic implantable cardioverter-defibrillator in situ    placed in 2006; removed on 07/29/2013 (07/29/2013)  . Cancer [facility]) Sept. 2015   Left Hand  -  Skin Cancer  . Carotid stenosis    occluded right intracranial ICA followed by Dr. Donnetta Hutching.  1-39% bilateral ICA stenosis  . Cerebrovascular disease    extracranial  . Chronic diastolic CHF (congestive heart failure) ([place])   . Chronic kidney disease, unspecified ([place])   . Complete heart block ([place]) 07/29/2013   Medtronic Adapta L DR implanted with new RA and RV leads inserted  . Coronary artery disease    s/p CABG  . Fractured shoulder 2009   Right shoulder Fx. from a fall  . Heart murmur 05/14/2016  .  Hyperlipidemia   . Hypertension   . Ischemic cardiomyopathy    s/p ICD (MDT) by Dr Leonia Reeves 2006, EF 24% at the time but now 55%  . Osteoporosis   . Overactive bladder   . Paroxysmal atrial fibrillation (HCC)   . Severe mitral regurgitation    s/p MV ring  . Stroke [facility]) 07/2005   "when they took me off heart/lung machine; affected my left foot" (07/29/2013)  . Type II diabetes mellitus ([place])    Past Surgical History:  Procedure Laterality Date  . ABDOMINAL HYSTERECTOMY  ? 1974  . APPENDECTOMY  1949  . CARDIAC CATHETERIZATION  07/2005  . CARDIAC DEFIBRILLATOR PLACEMENT  11/2005   by Dr Leonia Reeves (MDT) she has a [phone] fidelis lead  . CARDIAC VALVE REPLACEMENT  07/2005   "mitral valve" (07/29/2013)  . CATARACT EXTRACTION W/ INTRAOCULAR LENS  IMPLANT, BILATERAL Bilateral 1980's  . CORONARY ARTERY BYPASS GRAFT  07/2005   "CABG X3" (07/29/2013)  . JOINT REPLACEMENT Right 2006   Hip  . MITRAL VALVE REPLACEMENT  07/2005  . PACEMAKER PLACEMENT  07/29/2013   upgrade of single chamber ICD to Medtronic Adapta L DR implanted with new RA and RV leads inserted  . PERMANENT PACEMAKER INSERTION N/A 07/29/2013   Procedure: PERMANENT PACEMAKER INSERTION;  Surgeon: Thompson Grayer, MD;  Location: [facility] CATH LAB;  Service: Cardiovascular;  Laterality: N/A;  . SALPINGOOPHORECTOMY Bilateral 1976  .  SHOULDER ARTHROSCOPY W/ ROTATOR CUFF REPAIR Right 1998  . TOTAL HIP ARTHROPLASTY Right 2001     Current Outpatient Prescriptions  Medication Sig Dispense Refill  . acetaminophen (TYLENOL) 500 MG tablet Take 500 mg by mouth daily as needed (pain).     Marland Kitchen aspirin EC 81 MG tablet Take 81 mg by mouth every morning.    . carvedilol (COREG) 12.5 MG tablet Take 6.25 mg by mouth 2 (two) times daily with a meal.    . Cholecalciferol (VITAMIN D) 2000 UNITS tablet Take 2,000 Units by mouth daily.     . fish oil-omega-3 fatty acids 1000 MG capsule Take 1 g by mouth 2 (two) times daily.     Marland Kitchen glipiZIDE (GLUCOTROL XL) 10 MG 24 hr  tablet Take 20 mg by mouth daily.    . Multiple Vitamins-Minerals (CENTRUM SILVER PO) Take 1 tablet by mouth daily.     Marland Kitchen oxybutynin (DITROPAN) 5 MG tablet Take 1 tablet by mouth 2 (two) times daily.  3  . sacubitril-valsartan (ENTRESTO) 24-26 MG Take 1 tablet by mouth 2 (two) times daily. 60 tablet 3  . simvastatin (ZOCOR) 40 MG tablet Take 1 tablet (40 mg total) by mouth at bedtime. 30 tablet 6  . spironolactone (ALDACTONE) 25 MG tablet Take 1 tablet (25 mg total) by mouth daily. 90 tablet 3  . vitamin B-12 (CYANOCOBALAMIN) 1000 MCG tablet Take 1,000 mcg by mouth daily.     No current facility-administered medications for this visit.     Allergies:   Fosamax [alendronate]; Horse-derived products; Alendronate sodium; Clonidine derivatives; Darvocet [propoxyphene n-acetaminophen]; Lipitor [atorvastatin]; Sulfa drugs cross reactors; Tramadol; Vicodin [hydrocodone-acetaminophen]; Penicillins; and Septra [sulfamethoxazole-trimethoprim]   Social History:  The patient  reports that she has never smoked. She has never used smokeless tobacco. She reports that she does not drink alcohol or use drugs.   Family History:  The patient's family history includes Cancer in her mother and sister; Diabetes in her daughter and sister; Heart attack in her sister; Heart disease in her mother and sister; Hyperlipidemia in her sister.    ROS:  Please see the history of present illness.   All other systems are reviewed and negative.    PHYSICAL EXAM: VS:  BP 126/68   Pulse 80   Ht 5\' 2"  (1.575 m)   Wt 127 lb 9.6 oz (57.9 kg)   BMI 23.34 kg/m  , BMI Body mass index is 23.34 kg/m. GEN: Well nourished, well developed, in no acute distress  HEENT: normal  Neck: no JVD, carotid bruits, or masses Cardiac: RRR; 2/6 SEM at apex Respiratory:  clear to auscultation bilaterally, normal work of breathing GI: soft, nontender, nondistended, + BS MS: no deformity or atrophy  Skin: warm and dry, device pocket is well  healed Neuro:  Strength and sensation are intact Psych: euthymic mood, full affect  Device interrogation is reviewed today in detail.  See PaceArt for details.   Recent Labs: 11/16/2015: Hemoglobin 12.2; Platelets 142 05/30/2016: ALT 18 08/28/2016: B Natriuretic Peptide 456.0; BUN 18; Creatinine, Ser 1.16; Potassium 4.8; Sodium 139    Lipid Panel     Component Value Date/Time   CHOL 120 (L) 05/30/2016 0840   TRIG 67 05/30/2016 0840   HDL 55 05/30/2016 0840   CHOLHDL 2.2 05/30/2016 0840   VLDL 13 05/30/2016 0840   LDLCALC 52 05/30/2016 0840     Wt Readings from Last 3 Encounters:  09/10/16 127 lb 9.6 oz (57.9 kg)  08/28/16 129 lb  12 oz (58.9 kg)  07/16/16 131 lb 4 oz (59.5 kg)     ASSESSMENT AND PLAN:  1.  Transient complete heart block Normal pacemaker function See Pace Art report No changes today V paces only 10% Though she has baseline QRS 150 msec, she would be high risk for device upgrade given her advanced age and fragility.  In addition, her primary limitation is with unsteadiness rather than SOB.  She has ongoing MR and biventricular failure and severe pulmonary hypertension. After long discussion, she and I agree that a conservative route without device upgrade is appropriate for this elderly patient.  2. HTN Stable No change required today  3. CAD/ chronic systolic dysfunction No ischemic symptoms As above Would avoid any EP procedures going forward Continue medical optimization by CHF clinic/ Dr Radford Pax  4. Paroxysmal atrial fibrillation Very rare, avoiding anticoagulation due to falls and advanced age Will reconsider if AF burden increases.  Follow-up: carelink, return to see EP NP in 1 year  Current medicines are reviewed at length with the patient today.   The patient does not have concerns regarding her medicines.  The following changes were made today:  none   Signed, Thompson Grayer, MD  09/10/2016 9:26 AM     [facility] [address] [place] [address] [phone] (office) [phone] (fax)

## 2016-09-12 ENCOUNTER — Encounter: Payer: Self-pay | Admitting: Cardiology

## 2016-09-25 ENCOUNTER — Other Ambulatory Visit: Payer: Self-pay | Admitting: Family Medicine

## 2016-09-25 DIAGNOSIS — Z1231 Encounter for screening mammogram for malignant neoplasm of breast: Secondary | ICD-10-CM

## 2016-09-26 ENCOUNTER — Telehealth: Payer: Self-pay | Admitting: *Deleted

## 2016-09-26 DIAGNOSIS — E785 Hyperlipidemia, unspecified: Secondary | ICD-10-CM

## 2016-09-26 MED ORDER — EZETIMIBE 10 MG PO TABS: 10.0000 mg | ORAL_TABLET | Freq: Every day | ORAL | 11 refills | Status: DC

## 2016-09-26 NOTE — Telephone Encounter (Signed)
-----   Message from Sueanne Margarita, MD sent at 09/13/2016  3:21 PM EDT ----- LDL 75 on last lab draw at PCP - please add zetia 10mg  daily and repeat FLp and ALT in 8 weeks

## 2016-09-26 NOTE — Telephone Encounter (Signed)
Informed pt of lab results and recommendations. Pt verbalized understanding and was in agreement with this plan. Labs scheduled for 11/21.

## 2016-10-22 ENCOUNTER — Ambulatory Visit
Admission: RE | Admit: 2016-10-22 | Discharge: 2016-10-22 | Disposition: A | Discharge status: Home / Self Care | Payer: Medicare Other | Source: Ambulatory Visit | Attending: Family Medicine | Admitting: Family Medicine

## 2016-10-22 DIAGNOSIS — Z1231 Encounter for screening mammogram for malignant neoplasm of breast: Secondary | ICD-10-CM

## 2016-10-23 ENCOUNTER — Encounter: Payer: Self-pay | Admitting: Cardiology

## 2016-10-24 ENCOUNTER — Encounter (HOSPITAL_COMMUNITY): Payer: Medicare Other

## 2016-10-29 ENCOUNTER — Ambulatory Visit (HOSPITAL_COMMUNITY)
Admission: RE | Admit: 2016-10-29 | Discharge: 2016-10-29 | Disposition: A | Discharge status: Home / Self Care | Payer: Medicare Other | Source: Ambulatory Visit | Attending: Cardiology | Admitting: Cardiology

## 2016-10-29 VITALS — BP 116/60 | HR 85 | Wt 128.4 lb

## 2016-10-29 DIAGNOSIS — I5022 Chronic systolic (congestive) heart failure: Secondary | ICD-10-CM | POA: Insufficient documentation

## 2016-10-29 DIAGNOSIS — Z7982 Long term (current) use of aspirin: Secondary | ICD-10-CM | POA: Insufficient documentation

## 2016-10-29 DIAGNOSIS — I442 Atrioventricular block, complete: Secondary | ICD-10-CM | POA: Diagnosis not present

## 2016-10-29 DIAGNOSIS — E785 Hyperlipidemia, unspecified: Secondary | ICD-10-CM | POA: Insufficient documentation

## 2016-10-29 DIAGNOSIS — Z951 Presence of aortocoronary bypass graft: Secondary | ICD-10-CM | POA: Diagnosis not present

## 2016-10-29 DIAGNOSIS — E782 Mixed hyperlipidemia: Secondary | ICD-10-CM | POA: Diagnosis not present

## 2016-10-29 DIAGNOSIS — Z79899 Other long term (current) drug therapy: Secondary | ICD-10-CM | POA: Diagnosis not present

## 2016-10-29 DIAGNOSIS — I255 Ischemic cardiomyopathy: Secondary | ICD-10-CM | POA: Diagnosis not present

## 2016-10-29 DIAGNOSIS — I6521 Occlusion and stenosis of right carotid artery: Secondary | ICD-10-CM | POA: Insufficient documentation

## 2016-10-29 DIAGNOSIS — I2510 Atherosclerotic heart disease of native coronary artery without angina pectoris: Secondary | ICD-10-CM | POA: Diagnosis not present

## 2016-10-29 DIAGNOSIS — I340 Nonrheumatic mitral (valve) insufficiency: Secondary | ICD-10-CM | POA: Diagnosis not present

## 2016-10-29 NOTE — Progress Notes (Signed)
Advanced Heart Failure Medication Review by a Pharmacist  Does the patient  feel that his/her medications are working for him/her?  yes  Has the patient been experiencing any side effects to the medications prescribed?  yes  Does the patient measure his/her own blood pressure or blood glucose at home?  yes   Does the patient have any problems obtaining medications due to transportation or finances?   yes  Understanding of regimen: good Understanding of indications: good Potential of compliance: good Patient understands to avoid NSAIDs. Patient understands to avoid decongestants.  Issues to address at subsequent visits: None   Pharmacist comments:  [name] is a pleasant 82 yo F presenting with her daughter and without a medication list. She reports good compliance with her regimen but does feel dizzy and "wobbly" when she stands from a sitting or lying position. She also states that her Zetia that she was recently started on is expensive at $45/mo. I have checked her [facility] Medicare Part D formulary and the generic is not yet on their formulary. It will likely be added within the next year so hopefully it will be cheaper for her soon.   [name], PharmD, BCPS, CPP Clinical Pharmacist Pager: [phone] Phone: [phone] 10/29/2016 10:45 AM      Time with patient: 10 minutes Preparation and documentation time: 2 minutes Total time: 12 minutes

## 2016-10-29 NOTE — Progress Notes (Signed)
Patient ID: [name], female   DOB: Oct 18, 1928, 82 y.o.   MRN: [id number] PCP: Dr Raliegh Ip. [facility] Cardiology: Dr. Radford Pax HF Cardiology: Dr. Aundra Dubin  82 yo with history of CAD s/p CABG, mitral regurgitation s/p MV repair, ischemic cardiomyopathy, complete heart block, and carotid stenosis presents for CHF clinic evaluation.  She had CABG + MVR in 2008.  She developed ischemic cardiomyopathy and had a Medtronic ICD placed.  However, by 2014, EF had increased back to 55%.  She developed complete heart block and the ICD was replaced with a dual chamber PPM.  She had a repeat echo due to louder murmur and EF was noted to have decreased to 20-25% with severe LV dilation, RV dysfunction, and severe pulmonary hypertension.  She had moderate MR (in setting of repaired mitral valve) and probably mild mitral stenosis.  Because of the fall in EF, she had a Cardiolite which showed no perfusion abnormalities.  She was referred to CHF clinic for evaluation.  Since last appointment, she saw Dr. Rayann Heman.  They decided against CRT upgrade as her main issue seems to be stability rather than dyspnea/HF.   She lives by herself although family is around to help.  She continues to be unsteady on her feet so uses a cane or walker (at times).  No falls.  Occasional lightheadedness with standing from lying or sitting, no syncope.  She can walk about 1/2 block before becoming short of breath.  This is chronic with no change.  No orthopnea/PND, no chest pain, no palpitations.  She does not take Lasix.  Weight is down 1 lb.   Labs (5/17): K 4.5, creatinine 0.92, LDL 52 Labs (6/17): BNP 467 Labs (7/17): K 4.7, creatinine 1.23 Labs (9/17): K 4.8, creatinine 1.16, LDL 75, HDL 48  PMH: 1. Carotid disease: Totally occluded RICA, followed by Dr. Donnetta Hutching.  2. Type II diabetes 3. Hyperlipidemia 4. HTN 5. Mitral regurgitation: S/p mitral valve repair with CABG in 2008.  6. CAD: CABG x 3 + MV repair in 2008.  Cardiolite (6/17) with EF 38%, no  perfusion abnormality.  7. Chronic systolic CHF: Ischemic cardiomyopathy.  She had a Medtronic ICD, then EF improved and this was replaced with a dual chamber Medtronic PPM when she later developed CHB. - Echo (2014) with EF 55%.  - Echo (5/17): severe LV dilation, EF 20-25%, diffuse hypokinesis, grade II diastolic dysfunction, mild AI, s/p MV repair with moderate MR and probably mild stenosis (mean gradient 7 mmHg but pressure halftime not elevated), RV moderately dilated with moderate-severe RV dysfunction, PASP 83 mmHg.   8. Complete heart block: Dual chamber Medtronic PPM.  9. S/p CVA 10. CKD 11. Neuropathy.  12. S/p THR  Social History   Social History  . Marital status: Widowed    Spouse name: N/A  . Number of children: N/A  . Years of education: N/A   Occupational History  . retired Marine scientist    Social History Main Topics  . Smoking status: Never Smoker  . Smokeless tobacco: Never Used  . Alcohol use No  . Drug use: No  . Sexual activity: No   Other Topics Concern  . Not on file   Social History Narrative  . No narrative on file   Family History  Problem Relation Age of Onset  . Cancer Mother     [name]   . Heart disease Mother     Before age 80  . Heart disease Sister     Before age  5-  CABG  . Cancer Sister     [name]  . Diabetes Sister   . Hyperlipidemia Sister   . Heart attack Sister   . Diabetes Daughter   . Coronary artery disease      family hx of  . Hypertension      family hx of  . Diabetes      family hx of   ROS: All systems reviewed and negative except as per HPI.   Current Outpatient Prescriptions  Medication Sig Dispense Refill  . acetaminophen (TYLENOL) 500 MG tablet Take 500 mg by mouth daily as needed (pain).     Marland Kitchen aspirin EC 81 MG tablet Take 81 mg by mouth every morning.    . carvedilol (COREG) 12.5 MG tablet Take 12.5 mg by mouth 2 (two) times daily with a meal.     . Cholecalciferol (VITAMIN D) 2000 UNITS tablet Take 2,000 Units  by mouth daily.     Marland Kitchen ezetimibe (ZETIA) 10 MG tablet Take 1 tablet (10 mg total) by mouth daily. 30 tablet 11  . fish oil-omega-3 fatty acids 1000 MG capsule Take 1 g by mouth 2 (two) times daily.     Marland Kitchen glipiZIDE (GLUCOTROL XL) 10 MG 24 hr tablet Take 20 mg by mouth daily.    . Multiple Vitamins-Minerals (CENTRUM SILVER PO) Take 1 tablet by mouth daily.     Marland Kitchen oxybutynin (DITROPAN) 5 MG tablet Take 1 tablet by mouth 2 (two) times daily.  3  . sacubitril-valsartan (ENTRESTO) 24-26 MG Take 1 tablet by mouth 2 (two) times daily. 60 tablet 3  . simvastatin (ZOCOR) 40 MG tablet Take 1 tablet (40 mg total) by mouth at bedtime. 30 tablet 6  . spironolactone (ALDACTONE) 25 MG tablet Take 1 tablet (25 mg total) by mouth daily. 90 tablet 3  . vitamin B-12 (CYANOCOBALAMIN) 1000 MCG tablet Take 1,000 mcg by mouth daily.     No current facility-administered medications for this encounter.    BP 116/60 (BP Location: Left Arm, Patient Position: Sitting, Cuff Size: Normal)   Pulse 85   Wt 128 lb 6.4 oz (58.2 kg)   SpO2 96%   BMI 23.48 kg/m  General: NAD Neck: No JVD, no thyromegaly or thyroid nodule.  Lungs: Clear to auscultation bilaterally with normal respiratory effort. CV: Nondisplaced PMI.  Heart regular S1/S2, no S3/S4, 2/6 HSM apex.  No peripheral edema.  No carotid bruit.  Normal pedal pulses.  Abdomen: Soft, nontender, no hepatosplenomegaly, no distention.  Skin: Intact without lesions or rashes.  Neurologic: Alert and oriented x 3.  Psych: Normal affect. Extremities: No clubbing or cyanosis.  HEENT: Normal.   Assessment/Plan: 1. Chronic systolic CHF: Ischemic cardiomyopathy.  Long-standing.  EF had improved to 55% by 2014 and ICD was removed and replaced with dual chamber PPM in the setting of CHB.  However, echo was done in 5/17 for increased murmur and decline in EF to 20-25% with RV dysfunction was noted.  She has had no chest pain and no change her in chronic NYHA class II-III symptoms.   She is not volume overloaded on exam.  It is certainly possible that EF has fallen with worsening coronary disease though no perfusion defects were noted on recent Cardiolite.  Balance difficulty seems to be a greater issue than dyspnea.  -  I think that it is reasonable to hold off on cardiac catheterization given lack of symptomatic change, especially with her advanced age.  She is willing to adjust  her medications as needed.  - She has tolerated Entresto.  Will not increase dose today given lightheadedness with standing.   - Continue Coreg and spironolactone at current doses.  - She is not volume overloaded on exam, I do not think that she needs Lasix.  - Discussed with Dr Rayann Heman, decided against BiV upgrade. 2. CAD: Stable with no chest pain.  EF has fallen recently but Cardiolite showed no ischemia.  She wants to avoid procedures so will hold off on cath.  - Continue ASA 81 daily and statin.  3. Carotid stenosis: RICA is occluded, followed by Dr Donnetta Hutching.  4. Complete heart block: Medtronic PPM.  5. Mitral regurgitation: S/p MV repair.  Moderate MR on last echo with probably mild MS (gradient elevated but pressure halftime is not).   6. Hyperlipidemia: Recent LDL 75 on simvastatin 40 daily.  She was prescribed Zetia but it is expensive.  I think it would be reasonable for her hold off on taking Zetia for now.   Followup in 4 months.   Loralie Champagne 10/29/2016

## 2016-10-29 NOTE — Patient Instructions (Signed)
Follow up 4 months with Dr. Aundra Dubin. We will call you closer to this time, or you may call our office to schedule 1 month before you are due to be seen.  Do the following things EVERYDAY: 1) Weigh yourself in the morning before breakfast. Write it down and keep it in a log. 2) Take your medicines as prescribed 3) Eat low salt foods-Limit salt (sodium) to 2000 mg per day.  4) Stay as active as you can everyday 5) Limit all fluids for the day to less than 2 liters

## 2016-11-05 ENCOUNTER — Ambulatory Visit (INDEPENDENT_AMBULATORY_CARE_PROVIDER_SITE_OTHER): Payer: Medicare Other | Admitting: Cardiology

## 2016-11-05 ENCOUNTER — Other Ambulatory Visit: Payer: Medicare Other | Admitting: *Deleted

## 2016-11-05 ENCOUNTER — Encounter: Payer: Self-pay | Admitting: Cardiology

## 2016-11-05 VITALS — BP 144/78 | HR 78 | Ht 62.0 in | Wt 128.8 lb

## 2016-11-05 DIAGNOSIS — I442 Atrioventricular block, complete: Secondary | ICD-10-CM | POA: Diagnosis not present

## 2016-11-05 DIAGNOSIS — E785 Hyperlipidemia, unspecified: Secondary | ICD-10-CM

## 2016-11-05 DIAGNOSIS — I340 Nonrheumatic mitral (valve) insufficiency: Secondary | ICD-10-CM

## 2016-11-05 DIAGNOSIS — I5042 Chronic combined systolic (congestive) and diastolic (congestive) heart failure: Secondary | ICD-10-CM | POA: Diagnosis not present

## 2016-11-05 DIAGNOSIS — I10 Essential (primary) hypertension: Secondary | ICD-10-CM

## 2016-11-05 DIAGNOSIS — I2510 Atherosclerotic heart disease of native coronary artery without angina pectoris: Secondary | ICD-10-CM

## 2016-11-05 DIAGNOSIS — E7800 Pure hypercholesterolemia, unspecified: Secondary | ICD-10-CM

## 2016-11-05 DIAGNOSIS — I6529 Occlusion and stenosis of unspecified carotid artery: Secondary | ICD-10-CM

## 2016-11-05 NOTE — Patient Instructions (Signed)
Medication Instructions:  1) STOP ZETIA  Labwork: Your physician recommends that you return FASTING for lab work in [place].  Testing/Procedures: None  Follow-Up: Your physician wants you to follow-up in: 6 months with Dr. Radford Pax. You will receive a reminder letter in the mail two months in advance. If you don't receive a letter, please call our office to schedule the follow-up appointment.   Any Other Special Instructions Will Be Listed Below (If Applicable).     If you need a refill on your cardiac medications before your next appointment, please call your pharmacy.

## 2016-11-05 NOTE — Progress Notes (Signed)
Cardiology Office Note    Date:  11/05/2016   ID:  [name], DOB [birth year], MRN [id number]  PCP:  Mayra Neer, MD  Cardiologist:  Fransico Him, MD   No chief complaint on file.   History of Present Illness:  [name] is a 82 y.o. female who presents today for followup of her ischemic DCM, chronic diastolic CHF, ASCAD, dyslipidemia, MR s/p MV repair, and HTN. She is doing well. She denies any chest pain or chest pressure, SOB or DOE, LE edema,palpitations or syncope.  She occasionally will get dizzy when going from sitting to standing or standing for a while.  She says for the past month her shoulders have been bothering her and is having problems lifting her arms above her head due to shoulder pain.     Past Medical History:  Diagnosis Date  . Anxiety    "when I come to the hospital; don't take any RX" (07/29/2013)  . Arthritis    "left wrist; back" (07/29/2013)  . Automatic implantable cardioverter-defibrillator in situ    placed in 2006; removed on 07/29/2013 (07/29/2013)  . Cancer [facility]) Sept. 2015   Left Hand  -  Skin Cancer  . Carotid stenosis    occluded right intracranial ICA followed by Dr. Donnetta Hutching.  1-39% bilateral ICA stenosis  . Cerebrovascular disease    extracranial  . Chronic combined systolic and diastolic CHF (congestive heart failure) ([place])   . Chronic kidney disease, unspecified   . Complete heart block ([place]) 07/29/2013   Medtronic Adapta L DR implanted with new RA and RV leads inserted  . Coronary artery disease    s/p CABG  . Fractured shoulder 2009   Right shoulder Fx. from a fall  . Heart murmur 05/14/2016  . Hyperlipidemia   . Hypertension   . Ischemic cardiomyopathy    s/p ICD (MDT) by Dr Leonia Reeves 2006, EF 24% at the time but now 55%  . Osteoporosis   . Overactive bladder   . Paroxysmal atrial fibrillation (H[facility])   . Severe mitral regurgitation    s/p MV ring  . Stroke [facility]) 07/2005   "when they took me off heart/lung machine; affected  my left foot" (07/29/2013)  . Type II diabetes mellitus ([place])     Past Surgical History:  Procedure Laterality Date  . ABDOMINAL HYSTERECTOMY  ? 1974  . APPENDECTOMY  1949  . CARDIAC CATHETERIZATION  07/2005  . CARDIAC DEFIBRILLATOR PLACEMENT  11/2005   by Dr Leonia Reeves (MDT) she has a [phone] fidelis lead  . CARDIAC VALVE REPLACEMENT  07/2005   "mitral valve" (07/29/2013)  . CATARACT EXTRACTION W/ INTRAOCULAR LENS  IMPLANT, BILATERAL Bilateral 1980's  . CORONARY ARTERY BYPASS GRAFT  07/2005   "CABG X3" (07/29/2013)  . JOINT REPLACEMENT Right 2006   Hip  . MITRAL VALVE REPLACEMENT  07/2005  . PACEMAKER PLACEMENT  07/29/2013   upgrade of single chamber ICD to Medtronic Adapta L DR implanted with new RA and RV leads inserted  . PERMANENT PACEMAKER INSERTION N/A 07/29/2013   Procedure: PERMANENT PACEMAKER INSERTION;  Surgeon: Thompson Grayer, MD;  Location: [facility] CATH LAB;  Service: Cardiovascular;  Laterality: N/A;  . SALPINGOOPHORECTOMY Bilateral 1976  . SHOULDER ARTHROSCOPY W/ ROTATOR CUFF REPAIR Right 1998  . TOTAL HIP ARTHROPLASTY Right 2001    Current Medications: Outpatient Medications Prior to Visit  Medication Sig Dispense Refill  . acetaminophen (TYLENOL) 500 MG tablet Take 500 mg by mouth daily as needed (pain).     Marland Kitchen  aspirin EC 81 MG tablet Take 81 mg by mouth every morning.    . carvedilol (COREG) 12.5 MG tablet Take 12.5 mg by mouth 2 (two) times daily with a meal.     . Cholecalciferol (VITAMIN D) 2000 UNITS tablet Take 2,000 Units by mouth daily.     Marland Kitchen ezetimibe (ZETIA) 10 MG tablet Take 1 tablet (10 mg total) by mouth daily. 30 tablet 11  . fish oil-omega-3 fatty acids 1000 MG capsule Take 1 g by mouth 2 (two) times daily.     Marland Kitchen glipiZIDE (GLUCOTROL XL) 10 MG 24 hr tablet Take 20 mg by mouth daily.    . Multiple Vitamins-Minerals (CENTRUM SILVER PO) Take 1 tablet by mouth daily.     Marland Kitchen oxybutynin (DITROPAN) 5 MG tablet Take 1 tablet by mouth 2 (two) times daily.  3  .  sacubitril-valsartan (ENTRESTO) 24-26 MG Take 1 tablet by mouth 2 (two) times daily. 60 tablet 3  . simvastatin (ZOCOR) 40 MG tablet Take 1 tablet (40 mg total) by mouth at bedtime. 30 tablet 6  . spironolactone (ALDACTONE) 25 MG tablet Take 1 tablet (25 mg total) by mouth daily. 90 tablet 3  . vitamin B-12 (CYANOCOBALAMIN) 1000 MCG tablet Take 1,000 mcg by mouth daily.     No facility-administered medications prior to visit.      Allergies:   Fosamax [alendronate]; Horse-derived products; Alendronate sodium; Clonidine derivatives; Darvocet [propoxyphene n-acetaminophen]; Lipitor [atorvastatin]; Sulfa drugs cross reactors; Tramadol; Vicodin [hydrocodone-acetaminophen]; Penicillins; and Septra [sulfamethoxazole-trimethoprim]   Social History   Social History  . Marital status: Widowed    Spouse name: N/A  . Number of children: N/A  . Years of education: N/A   Occupational History  . retired Marine scientist    Social History Main Topics  . Smoking status: Never Smoker  . Smokeless tobacco: Never Used  . Alcohol use No  . Drug use: No  . Sexual activity: No   Other Topics Concern  . None   Social History Narrative  . None     Family History:  The patient's family history includes Cancer in her mother and sister; Diabetes in her daughter and sister; Heart attack in her sister; Heart disease in her mother and sister; Hyperlipidemia in her sister.   ROS:   Please see the history of present illness.    ROS All other systems reviewed and are negative.  No flowsheet data found.     PHYSICAL EXAM:   VS:  BP (!) 144/78   Pulse 78   Ht 5\' 2"  (1.575 m)   Wt 128 lb 12.8 oz (58.4 kg)   SpO2 98%   BMI 23.56 kg/m    GEN: Well nourished, well developed, in no acute distress  HEENT: normal  Neck: no JVD, carotid bruits, or masses Cardiac: RRR; no murmurs, rubs, or gallops,no edema.  Intact distal pulses bilaterally.  Respiratory:  clear to auscultation bilaterally, normal work of  breathing GI: soft, nontender, nondistended, + BS MS: no deformity or atrophy  Skin: warm and dry, no rash Neuro:  Alert and Oriented x 3, Strength and sensation are intact Psych: euthymic mood, full affect  Wt Readings from Last 3 Encounters:  11/05/16 128 lb 12.8 oz (58.4 kg)  10/29/16 128 lb 6.4 oz (58.2 kg)  09/10/16 127 lb 9.6 oz (57.9 kg)      Studies/Labs Reviewed:   EKG:  EKG is not ordered today.    Recent Labs: 11/16/2015: Hemoglobin 12.2; Platelets 142 05/30/2016: ALT  18 08/28/2016: B Natriuretic Peptide 456.0; BUN 18; Creatinine, Ser 1.16; Potassium 4.8; Sodium 139   Lipid Panel    Component Value Date/Time   CHOL 120 (L) 05/30/2016 0840   TRIG 67 05/30/2016 0840   HDL 55 05/30/2016 0840   CHOLHDL 2.2 05/30/2016 0840   VLDL 13 05/30/2016 0840   LDLCALC 52 05/30/2016 0840    Additional studies/ records that were reviewed today include:  none    ASSESSMENT:    1. Chronic combined systolic and diastolic CHF (congestive heart failure) ([place])   2. Complete heart block ([place])   3. Coronary artery disease involving native coronary artery of native heart without angina pectoris   4. Essential hypertension   5. Severe mitral regurgitation   6. Stenosis of carotid artery, unspecified laterality   7. Pure hypercholesterolemia      PLAN:  In order of problems listed above:  1. Chronic combined systolic/diastolic CHF - she appears euvolemic on exam. Her weight is stable.  EF had  Normalized to 55% but then declinced to 20-25% with RV dysfunction and was seen by Dr. Aundra Dubin.  She has not had any CP.  She does not want cath and has not had any CP so will continue with medical thearpy.  Continue BB/entresto and diuretic.  After discussion with EP it was decided not to upgrade device to CRT. 2. Complete HB s/p PPM followed in pacer clinic. 3. ASCAD s/p remote CABG.  She has no anginal symptoms.  Continue statin/ASA and BB. 4. HTN - BP controlled on current meds.   Continue BB/ARB and diuretic.  5. Severe MR s/p MV repair with last echo showing moderate MR and mild MS.  Will continue to follow.   6. Carotid artery stenosis - 1-39% and occluded right intracranial ICA followed by Dr. Donnetta Hutching.  Continue statin and ASA. 7. Hyperlipidemia - LDL goal < 70. Continue simvastatin.  She cannot afford the zetia and has not had any since 10/31.  I will check FLP and ALT in 6 weeks.      Medication Adjustments/Labs and Tests Ordered: Current medicines are reviewed at length with the patient today.  Concerns regarding medicines are outlined above.  Medication changes, Labs and Tests ordered today are listed in the Patient Instructions below.  There are no Patient Instructions on file for this visit.   Signed, Fransico Him, MD  11/05/2016 10:53 AM    [facility] [place], [place], [place]  [address] Phone: [phone]; Fax: [phone]

## 2016-11-07 ENCOUNTER — Encounter: Payer: Self-pay | Admitting: Family

## 2016-11-09 ENCOUNTER — Encounter: Payer: Self-pay | Admitting: Family

## 2016-11-12 ENCOUNTER — Other Ambulatory Visit (HOSPITAL_COMMUNITY): Payer: Self-pay | Admitting: Cardiology

## 2016-11-13 ENCOUNTER — Encounter: Payer: Self-pay | Admitting: Family

## 2016-11-13 ENCOUNTER — Ambulatory Visit (INDEPENDENT_AMBULATORY_CARE_PROVIDER_SITE_OTHER): Payer: Medicare Other | Admitting: Family

## 2016-11-13 ENCOUNTER — Ambulatory Visit (HOSPITAL_COMMUNITY)
Admission: RE | Admit: 2016-11-13 | Discharge: 2016-11-13 | Disposition: A | Discharge status: Home / Self Care | Payer: Medicare Other | Source: Ambulatory Visit | Attending: Vascular Surgery | Admitting: Vascular Surgery

## 2016-11-13 VITALS — BP 119/68 | HR 65 | Temp 97.1°F | Resp 16 | Ht 62.0 in | Wt 125.9 lb

## 2016-11-13 DIAGNOSIS — I6523 Occlusion and stenosis of bilateral carotid arteries: Secondary | ICD-10-CM | POA: Diagnosis not present

## 2016-11-13 LAB — VAS US CAROTID
LCCADDIAS: 14 cm/s
LEFT ECA DIAS: -2 cm/s
LEFT VERTEBRAL DIAS: 13 cm/s
LICADDIAS: -26 cm/s
LICAPDIAS: 9 cm/s
LICAPSYS: 47 cm/s
Left CCA dist sys: 81 cm/s
Left CCA prox dias: 14 cm/s
Left CCA prox sys: 75 cm/s
Left ICA dist sys: -90 cm/s
RCCADSYS: -25 cm/s
RIGHT CCA MID DIAS: 0 cm/s
RIGHT ECA DIAS: 0 cm/s
RIGHT VERTEBRAL DIAS: 11 cm/s

## 2016-11-13 NOTE — Patient Instructions (Signed)
Stroke Prevention Some medical conditions and behaviors are associated with an increased chance of having a stroke. You may prevent a stroke by making healthy choices and managing medical conditions. How can I reduce my risk of having a stroke?  Stay physically active. Get at least 30 minutes of activity on most or all days.  Do not smoke. It may also be helpful to avoid exposure to secondhand smoke.  Limit alcohol use. Moderate alcohol use is considered to be:  No more than 2 drinks per day for men.  No more than 1 drink per day for nonpregnant women.  Eat healthy foods. This involves:  Eating 5 or more servings of fruits and vegetables a day.  Making dietary changes that address high blood pressure (hypertension), high cholesterol, diabetes, or obesity.  Manage your cholesterol levels.  Making food choices that are high in fiber and low in saturated fat, trans fat, and cholesterol may control cholesterol levels.  Take any prescribed medicines to control cholesterol as directed by your health care provider.  Manage your diabetes.  Controlling your carbohydrate and sugar intake is recommended to manage diabetes.  Take any prescribed medicines to control diabetes as directed by your health care provider.  Control your hypertension.  Making food choices that are low in salt (sodium), saturated fat, trans fat, and cholesterol is recommended to manage hypertension.  Ask your health care provider if you need treatment to lower your blood pressure. Take any prescribed medicines to control hypertension as directed by your health care provider.  If you are 18-39 years of age, have your blood pressure checked every 3-5 years. If you are 40 years of age or older, have your blood pressure checked every year.  Maintain a healthy weight.  Reducing calorie intake and making food choices that are low in sodium, saturated fat, trans fat, and cholesterol are recommended to manage  weight.  Stop drug abuse.  Avoid taking birth control pills.  Talk to your health care provider about the risks of taking birth control pills if you are over 35 years old, smoke, get migraines, or have ever had a blood clot.  Get evaluated for sleep disorders (sleep apnea).  Talk to your health care provider about getting a sleep evaluation if you snore a lot or have excessive sleepiness.  Take medicines only as directed by your health care provider.  For some people, aspirin or blood thinners (anticoagulants) are helpful in reducing the risk of forming abnormal blood clots that can lead to stroke. If you have the irregular heart rhythm of atrial fibrillation, you should be on a blood thinner unless there is a good reason you cannot take them.  Understand all your medicine instructions.  Make sure that other conditions (such as anemia or atherosclerosis) are addressed. Get help right away if:  You have sudden weakness or numbness of the face, arm, or leg, especially on one side of the body.  Your face or eyelid droops to one side.  You have sudden confusion.  You have trouble speaking (aphasia) or understanding.  You have sudden trouble seeing in one or both eyes.  You have sudden trouble walking.  You have dizziness.  You have a loss of balance or coordination.  You have a sudden, severe headache with no known cause.  You have new chest pain or an irregular heartbeat. Any of these symptoms may represent a serious problem that is an emergency. Do not wait to see if the symptoms will go away.   Get medical help at once. Call your local emergency services (911 in [place] Do not drive yourself to the hospital. This information is not intended to replace advice given to you by your health care provider. Make sure you discuss any questions you have with your health care provider. Document Released: 01/24/2005 Document Revised: 05/24/2016 Document Reviewed: 06/19/2013 Elsevier  Interactive Patient Education  2017 [employer].  

## 2016-11-13 NOTE — Progress Notes (Signed)
Chief Complaint: Follow up Extracranial Carotid Artery Stenosis   History of Present Illness  [name] is a 82 y.o. female patient of Dr. Donnetta Hutching who presents today for continued discussion of her extracranial cerebrovascular occlusive disease. We have followed her for number of years with suspected right internal carotid artery occlusion. She recently underwent a repeat duplex and this suggested potential reconstituted flow in her right internal carotid artery. She continues to have no significant stenosis of her left carotid.  It was recommended that she undergo a CT angiogram for further discussion. She does report dizziness and unsteady gait but no focal neurologic deficits.  Dr. Donnetta Hutching last saw pt on 11/02/14. At that time CT angiogram of her head and neck revealed patency of her right carotid bifurcation. She has a subtotal or total occlusion of the intracranial portion of her internal carotid artery making her extracranial internal carotid quite small. This was compared to MRA in 2006 showing no change. Left carotid system shows no stenosis Dr. Donnetta Hutching discussed with the pt that this is a stable situation and he would not recommend any other evaluation or treatment; recommended that we see her again with carotid duplex in one year. She returns today for yearly surveillance.  She reports a stroke that affected her left foot at the end of her CABG surgery in July 2014, none subsequently.   Pt states she has felt "wobbly" when walking for years, has not improved nor worsened; states her legs feel like they may give way; this has not improved with her pacemaker insertion.  Pt Diabetic: Yes,in control, July, 2016 A1C was 6.0 Pt smoker: non-smoker   Pt meds include:  Statin : Yes  Betablocker: Yes  ASA: Yes, 81 mg daily  Other anticoagulants/antiplatelets: no   Past Medical History:  Diagnosis Date  . Anxiety    "when I come to the hospital; don't take any RX" (07/29/2013)  .  Arthritis    "left wrist; back" (07/29/2013)  . Automatic implantable cardioverter-defibrillator in situ    placed in 2006; removed on 07/29/2013 (07/29/2013)  . Cancer [facility]) Sept. 2015   Left Hand  -  Skin Cancer  . Carotid stenosis    occluded right intracranial ICA followed by Dr. Donnetta Hutching.  1-39% bilateral ICA stenosis  . Cerebrovascular disease    extracranial  . Chronic combined systolic and diastolic CHF (congestive heart failure) ([place])   . Chronic kidney disease, unspecified   . Complete heart block ([place]) 07/29/2013   Medtronic Adapta L DR implanted with new RA and RV leads inserted  . Coronary artery disease    s/p CABG  . Fractured shoulder 2009   Right shoulder Fx. from a fall  . Heart murmur 05/14/2016  . Hyperlipidemia   . Hypertension   . Ischemic cardiomyopathy    s/p ICD (MDT) by Dr Leonia Reeves 2006, EF 24% at the time but now 55%  . Osteoporosis   . Overactive bladder   . Paroxysmal atrial fibrillation (HCC)   . Severe mitral regurgitation    s/p MV ring  . Stroke [facility]) 07/2005   "when they took me off heart/lung machine; affected my left foot" (07/29/2013)  . Type II diabetes mellitus ([place])     Social History Social History  Substance Use Topics  . Smoking status: Never Smoker  . Smokeless tobacco: Never Used  . Alcohol use No    Family History Family History  Problem Relation Age of Onset  . Cancer Mother  Theadora Rama   . Heart disease Mother     Before age 75  . Heart disease Sister     Before age 83-  CABG  . Cancer Sister     Lonia Chimera  . Diabetes Sister   . Hyperlipidemia Sister   . Heart attack Sister   . Diabetes Daughter   . Coronary artery disease      family hx of  . Hypertension      family hx of  . Diabetes      family hx of    Surgical History Past Surgical History:  Procedure Laterality Date  . ABDOMINAL HYSTERECTOMY  ? 1974  . APPENDECTOMY  1949  . CARDIAC CATHETERIZATION  07/2005  . CARDIAC DEFIBRILLATOR PLACEMENT  11/2005   by Dr  Leonia Reeves (MDT) she has a [phone] fidelis lead  . CARDIAC VALVE REPLACEMENT  07/2005   "mitral valve" (07/29/2013)  . CATARACT EXTRACTION W/ INTRAOCULAR LENS  IMPLANT, BILATERAL Bilateral 1980's  . CORONARY ARTERY BYPASS GRAFT  07/2005   "CABG X3" (07/29/2013)  . JOINT REPLACEMENT Right 2006   Hip  . MITRAL VALVE REPLACEMENT  07/2005  . PACEMAKER PLACEMENT  07/29/2013   upgrade of single chamber ICD to Medtronic Adapta L DR implanted with new RA and RV leads inserted  . PERMANENT PACEMAKER INSERTION N/A 07/29/2013   Procedure: PERMANENT PACEMAKER INSERTION;  Surgeon: Thompson Grayer, MD;  Location: [facility] CATH LAB;  Service: Cardiovascular;  Laterality: N/A;  . SALPINGOOPHORECTOMY Bilateral 1976  . SHOULDER ARTHROSCOPY W/ ROTATOR CUFF REPAIR Right 1998  . TOTAL HIP ARTHROPLASTY Right 2001      Current Outpatient Prescriptions  Medication Sig Dispense Refill  . acetaminophen (TYLENOL) 500 MG tablet Take 500 mg by mouth daily as needed (pain).     Marland Kitchen aspirin EC 81 MG tablet Take 81 mg by mouth every morning.    . carvedilol (COREG) 12.5 MG tablet Take 12.5 mg by mouth 2 (two) times daily with a meal.     . Cholecalciferol (VITAMIN D) 2000 UNITS tablet Take 2,000 Units by mouth daily.     Marland Kitchen ENTRESTO 24-26 MG TAKE 1 TABLET BY MOUTH TWICE A DAY 60 tablet 3  . fish oil-omega-3 fatty acids 1000 MG capsule Take 1 g by mouth 2 (two) times daily.     Marland Kitchen glipiZIDE (GLUCOTROL XL) 10 MG 24 hr tablet Take 20 mg by mouth daily.    . Multiple Vitamins-Minerals (CENTRUM SILVER PO) Take 1 tablet by mouth daily.     Marland Kitchen oxybutynin (DITROPAN) 5 MG tablet Take 1 tablet by mouth 2 (two) times daily.  3  . simvastatin (ZOCOR) 40 MG tablet Take 1 tablet (40 mg total) by mouth at bedtime. 30 tablet 6  . spironolactone (ALDACTONE) 25 MG tablet Take 1 tablet (25 mg total) by mouth daily. 90 tablet 3  . vitamin B-12 (CYANOCOBALAMIN) 1000 MCG tablet Take 1,000 mcg by mouth daily.     No current facility-administered medications for  this visit.     Review of Systems : See HPI for pertinent positives and negatives.  Physical Examination  Vitals:   11/13/16 1332 11/13/16 1334  BP: 131/63 119/68  Pulse: 65   Resp: 16   Temp: 97.1 F (36.2 C)   TempSrc: Oral   SpO2: 99%   Weight: 125 lb 14.4 oz (57.1 kg)   Height: 5\' 2"  (1.575 m)    Body mass index is 23.03 kg/m.  General: WDWN female in NAD  GAIT: slow  and deliberate.  Eyes: PERRLA  Pulmonary: Respirations are non labored, CTAB, no rales, no rhonchi, or wheezing.  Cardiac: Regular rhythm, positive murmur.   VASCULAR EXAM  Carotid Bruits  Left  Right    Negative  Negative   Aorta is not palpable.  Radial pulses are 2+ palpable and equal.   LE Pulses  LEFT  RIGHT   FEMORAL  not palpable  not palpable   POPLITEAL  not palpable  not palpable   POSTERIOR TIBIAL  not palpable  1+ palpable   DORSALIS PEDIS  ANTERIOR TIBIAL  Faintly palpable  not palpable    Gastrointestinal: soft, nontender, BS WNL, no r/g, no palpable masses.  Musculoskeletal: No muscle atrophy/wasting. M/S 4/5 in UE's, 3/5 in LE's, Extremities without ischemic changes.  Neurologic: A&O X 3; Appropriate Affect, normal sensation,  Speech is normal. CN 2-12 intact, Pain and light touch intact in extremities, Motor exam as listed above.     Assessment: [name] is a 82 y.o. female whom we have followed for a number of years with suspected right internal carotid artery occlusion. She recently underwent a repeat duplex and this suggested potential reconstituted flow in her right internal carotid artery.  She reports a stroke that affected her left foot at the end of her CABG surgery in July 2014, none subsequently.    Dr. Donnetta Hutching last saw pt on 11/02/14. At that time CT angiogram of her head and neck revealed patency of her right carotid bifurcation. She has a subtotal or total occlusion of the intracranial portion of her internal carotid artery  making her extracranial internal carotid quite small. This was compared to MRA in 2006 showing no change. Left carotid system shows no stenosis Dr. Donnetta Hutching discussed with the pt that this is a stable situation and he would not recommend any other evaluation or treatment.  DATA Today's carotid duplex is consistent with the results from the 11/02/14 CTA: dampened resistive flow throughout the right carotid artery system suggestive of distal disease, known cavernous carotid atherosclerotic disease.  <40% left ICA stenosis.  Bilateral vertebral artery flow is antegrade. Bilateral subclavian artery waveforms are normal.  No significant change compared to last exam on 11-03-15.    Plan: Follow-up in 1 year with Carotid Duplex scan.  I discussed in depth with the patient the nature of atherosclerosis, and emphasized the importance of maximal medical management including strict control of blood pressure, blood glucose, and lipid levels, obtaining regular exercise, and continued cessation of smoking.  The patient is aware that without maximal medical management the underlying atherosclerotic disease process will progress, limiting the benefit of any interventions. The patient was given information about stroke prevention and what symptoms should prompt the patient to seek immediate medical care. Thank you for allowing [place] to participate in this patient's care.  Clemon Chambers, RN, MSN, FNP-C [facility] Office: [phone]  Clinic Physician: Scot Dock on call  11/13/16 1:39 PM

## 2016-11-20 ENCOUNTER — Other Ambulatory Visit: Payer: Medicare Other

## 2016-11-20 ENCOUNTER — Other Ambulatory Visit: Payer: Medicare Other | Admitting: *Deleted

## 2016-11-20 DIAGNOSIS — I5042 Chronic combined systolic (congestive) and diastolic (congestive) heart failure: Secondary | ICD-10-CM

## 2016-11-20 DIAGNOSIS — E7800 Pure hypercholesterolemia, unspecified: Secondary | ICD-10-CM

## 2016-11-20 DIAGNOSIS — I2510 Atherosclerotic heart disease of native coronary artery without angina pectoris: Secondary | ICD-10-CM

## 2016-11-20 LAB — HEPATIC FUNCTION PANEL
ALT: 17 U/L (ref 6–29)
AST: 21 U/L (ref 10–35)
Albumin: 4.2 g/dL (ref 3.6–5.1)
Alkaline Phosphatase: 61 U/L (ref 33–130)
BILIRUBIN DIRECT: 0.2 mg/dL (ref <=0.2)
BILIRUBIN INDIRECT: 0.6 mg/dL (ref 0.2–1.2)
Total Bilirubin: 0.8 mg/dL (ref 0.2–1.2)
Total Protein: 6.8 g/dL (ref 6.1–8.1)

## 2016-11-20 LAB — LIPID PANEL
Cholesterol: 148 mg/dL (ref <200)
HDL: 53 mg/dL (ref >50)
LDL CALC: 76 mg/dL (ref <100)
Total CHOL/HDL Ratio: 2.8 Ratio (ref <5.0)
Triglycerides: 97 mg/dL (ref <150)
VLDL: 19 mg/dL (ref <30)

## 2016-11-20 LAB — BASIC METABOLIC PANEL
BUN: 22 mg/dL (ref 7–25)
CHLORIDE: 105 mmol/L (ref 98–110)
CO2: 24 mmol/L (ref 20–31)
Calcium: 10.4 mg/dL (ref 8.6–10.4)
Creat: 1.29 mg/dL — ABNORMAL HIGH (ref 0.60–0.88)
Glucose, Bld: 98 mg/dL (ref 65–99)
POTASSIUM: 5.1 mmol/L (ref 3.5–5.3)
Sodium: 139 mmol/L (ref 135–146)

## 2016-11-20 NOTE — Addendum Note (Signed)
Addended by: Eulis Foster on: 11/20/2016 10:32 AM   Modules accepted: Orders

## 2016-11-26 ENCOUNTER — Encounter: Payer: Self-pay | Admitting: Pediatrics

## 2016-11-26 ENCOUNTER — Telehealth: Payer: Self-pay | Admitting: Pediatrics

## 2016-11-26 NOTE — Telephone Encounter (Signed)
-----   Message from Leeroy Bock, [facility] sent at 11/26/2016  9:26 AM EST ----- LDL 76 close to goal 70mg /dL with 2 previous LDL readings at goal. Since pt is so close to goal and given her advanced age, would give her the option to focus on dietary changes and limiting fat intake. Could also switch simvastatin to atorvastatin 20mg  if pt is interested.

## 2016-11-26 NOTE — Telephone Encounter (Signed)
Patient states [name] is now costing her $45 per bottle. She states she can not afford that medication. Please advise.

## 2016-11-26 NOTE — Telephone Encounter (Signed)
Needs samples and help from pharmacist, will arrange through CHF clinic.  [name], please call.

## 2016-11-26 NOTE — Telephone Encounter (Signed)
This encounter was created in error - please disregard.

## 2016-11-27 NOTE — Telephone Encounter (Signed)
I already enrolled [name] in [place] assistance for her [name] so I called [facility] and for some reason they did not run it through the PAN foundation this last fill. I have asked them to re-run it and it now went through for $0. They will have a $45 refund for her at the pharmacy and it should continue to go through for no charge to her until 07/2017. Patient aware and grateful for the assistance.   Ruta Hinds. Velva Harman, PharmD, BCPS, CPP Clinical Pharmacist Pager: [phone] Phone: [phone] 11/27/2016 3:18 PM

## 2016-12-10 ENCOUNTER — Ambulatory Visit (INDEPENDENT_AMBULATORY_CARE_PROVIDER_SITE_OTHER): Payer: Medicare Other | Admitting: *Deleted

## 2016-12-10 ENCOUNTER — Telehealth: Payer: Self-pay | Admitting: Cardiology

## 2016-12-10 DIAGNOSIS — I442 Atrioventricular block, complete: Secondary | ICD-10-CM

## 2016-12-10 NOTE — Progress Notes (Signed)
Remote pacemaker transmission.   

## 2016-12-10 NOTE — Telephone Encounter (Signed)
Follow Up: ° ° °She wants to know if you received her transmission? °

## 2016-12-10 NOTE — Telephone Encounter (Signed)
Spoke w/ pt and informed her that her transmission received.

## 2016-12-14 ENCOUNTER — Encounter: Payer: Self-pay | Admitting: Cardiology

## 2016-12-14 LAB — CUP PACEART REMOTE DEVICE CHECK
Battery Impedance: 159 Ohm
Battery Remaining Longevity: 126 mo
Battery Voltage: 2.79 V
Brady Statistic AP VP Percent: 3 %
Brady Statistic AS VP Percent: 0 %
Implantable Lead Location: 753860
Implantable Lead Model: 5076
Implantable Pulse Generator Implant Date: 20140730
Lead Channel Impedance Value: 434 Ohm
Lead Channel Pacing Threshold Amplitude: 0.625 V
Lead Channel Pacing Threshold Pulse Width: 0.4 ms
Lead Channel Setting Pacing Amplitude: 2.5 V
Lead Channel Setting Pacing Pulse Width: 0.4 ms
MDC IDC LEAD IMPLANT DT: 20140730
MDC IDC LEAD IMPLANT DT: 20140730
MDC IDC LEAD LOCATION: 753859
MDC IDC MSMT LEADCHNL RV IMPEDANCE VALUE: 434 Ohm
MDC IDC MSMT LEADCHNL RV PACING THRESHOLD AMPLITUDE: 0.75 V
MDC IDC MSMT LEADCHNL RV PACING THRESHOLD PULSEWIDTH: 0.4 ms
MDC IDC MSMT LEADCHNL RV SENSING INTR AMPL: 5.6 mV
MDC IDC SESS DTM: 20171211150752
MDC IDC SET LEADCHNL RA PACING AMPLITUDE: 2 V
MDC IDC SET LEADCHNL RV SENSING SENSITIVITY: 4 mV
MDC IDC STAT BRADY AP VS PERCENT: 96 %
MDC IDC STAT BRADY AS VS PERCENT: 1 %

## 2016-12-18 ENCOUNTER — Other Ambulatory Visit: Payer: Medicare Other | Admitting: *Deleted

## 2016-12-18 ENCOUNTER — Other Ambulatory Visit: Payer: Medicare Other

## 2016-12-18 DIAGNOSIS — I10 Essential (primary) hypertension: Secondary | ICD-10-CM

## 2016-12-18 DIAGNOSIS — I2510 Atherosclerotic heart disease of native coronary artery without angina pectoris: Secondary | ICD-10-CM

## 2016-12-18 LAB — BASIC METABOLIC PANEL
BUN: 29 mg/dL — ABNORMAL HIGH (ref 7–25)
CALCIUM: 9.7 mg/dL (ref 8.6–10.4)
CHLORIDE: 108 mmol/L (ref 98–110)
CO2: 23 mmol/L (ref 20–31)
CREATININE: 1.1 mg/dL — AB (ref 0.60–0.88)
Glucose, Bld: 109 mg/dL — ABNORMAL HIGH (ref 65–99)
Potassium: 4.6 mmol/L (ref 3.5–5.3)
SODIUM: 140 mmol/L (ref 135–146)

## 2016-12-18 LAB — HEPATIC FUNCTION PANEL
ALBUMIN: 4.1 g/dL (ref 3.6–5.1)
ALK PHOS: 52 U/L (ref 33–130)
ALT: 13 U/L (ref 6–29)
AST: 17 U/L (ref 10–35)
BILIRUBIN TOTAL: 0.7 mg/dL (ref 0.2–1.2)
Bilirubin, Direct: 0.2 mg/dL (ref <=0.2)
Indirect Bilirubin: 0.5 mg/dL (ref 0.2–1.2)
TOTAL PROTEIN: 6.5 g/dL (ref 6.1–8.1)

## 2016-12-18 LAB — LIPID PANEL
CHOL/HDL RATIO: 2.9 ratio (ref <5.0)
Cholesterol: 139 mg/dL (ref <200)
HDL: 48 mg/dL — AB (ref >50)
LDL CALC: 74 mg/dL (ref <100)
TRIGLYCERIDES: 86 mg/dL (ref <150)
VLDL: 17 mg/dL (ref <30)

## 2016-12-18 NOTE — Addendum Note (Signed)
Addended by: Eulis Foster on: 12/18/2016 08:52 AM   Modules accepted: Orders

## 2016-12-20 ENCOUNTER — Telehealth: Payer: Self-pay

## 2016-12-20 DIAGNOSIS — E785 Hyperlipidemia, unspecified: Secondary | ICD-10-CM

## 2016-12-20 NOTE — Telephone Encounter (Signed)
Informed patient of results and verbal understanding expressed.  Instructed patient to follow stricter low fat diet. Recall placed for FLP in 4 months. Patient agrees with treatment plan.

## 2016-12-20 NOTE — Telephone Encounter (Signed)
-----   Message from Sueanne Margarita, MD sent at 12/20/2016 11:16 AM EST ----- LDL close to goal - please have patient follow more strict low fat diet and repeat FLP in 4 months

## 2017-02-24 ENCOUNTER — Other Ambulatory Visit: Payer: Self-pay | Admitting: Cardiology

## 2017-03-11 ENCOUNTER — Ambulatory Visit (INDEPENDENT_AMBULATORY_CARE_PROVIDER_SITE_OTHER): Payer: Medicare Other | Admitting: *Deleted

## 2017-03-11 DIAGNOSIS — I442 Atrioventricular block, complete: Secondary | ICD-10-CM

## 2017-03-12 NOTE — Progress Notes (Signed)
Remote pacemaker transmission.   

## 2017-03-13 ENCOUNTER — Encounter: Payer: Self-pay | Admitting: Cardiology

## 2017-03-13 LAB — CUP PACEART REMOTE DEVICE CHECK
Battery Impedance: 183 Ohm
Battery Voltage: 2.79 V
Brady Statistic AP VS Percent: 97 %
Brady Statistic AS VP Percent: 0 %
Date Time Interrogation Session: 20180312120011
Implantable Lead Implant Date: 20140730
Implantable Lead Location: 753859
Implantable Lead Location: 753860
Implantable Lead Model: 5076
Lead Channel Impedance Value: 400 Ohm
Lead Channel Impedance Value: 451 Ohm
Lead Channel Pacing Threshold Amplitude: 0.75 V
Lead Channel Pacing Threshold Pulse Width: 0.4 ms
Lead Channel Setting Pacing Amplitude: 2.5 V
MDC IDC LEAD IMPLANT DT: 20140730
MDC IDC MSMT BATTERY REMAINING LONGEVITY: 120 mo
MDC IDC MSMT LEADCHNL RA PACING THRESHOLD AMPLITUDE: 0.5 V
MDC IDC MSMT LEADCHNL RV PACING THRESHOLD PULSEWIDTH: 0.4 ms
MDC IDC MSMT LEADCHNL RV SENSING INTR AMPL: 5.6 mV
MDC IDC PG IMPLANT DT: 20140730
MDC IDC SET LEADCHNL RA PACING AMPLITUDE: 2 V
MDC IDC SET LEADCHNL RV PACING PULSEWIDTH: 0.4 ms
MDC IDC SET LEADCHNL RV SENSING SENSITIVITY: 4 mV
MDC IDC STAT BRADY AP VP PERCENT: 2 %
MDC IDC STAT BRADY AS VS PERCENT: 1 %

## 2017-03-25 ENCOUNTER — Other Ambulatory Visit (HOSPITAL_COMMUNITY): Payer: Self-pay | Admitting: Cardiology

## 2017-04-30 ENCOUNTER — Encounter: Payer: Self-pay | Admitting: Cardiology

## 2017-05-16 ENCOUNTER — Encounter: Payer: Self-pay | Admitting: Cardiology

## 2017-05-16 ENCOUNTER — Ambulatory Visit (INDEPENDENT_AMBULATORY_CARE_PROVIDER_SITE_OTHER): Payer: Medicare Other | Admitting: Cardiology

## 2017-05-16 VITALS — BP 138/80 | HR 86 | Ht 62.0 in | Wt 131.8 lb

## 2017-05-16 DIAGNOSIS — I340 Nonrheumatic mitral (valve) insufficiency: Secondary | ICD-10-CM | POA: Diagnosis not present

## 2017-05-16 DIAGNOSIS — I2510 Atherosclerotic heart disease of native coronary artery without angina pectoris: Secondary | ICD-10-CM

## 2017-05-16 DIAGNOSIS — I5042 Chronic combined systolic (congestive) and diastolic (congestive) heart failure: Secondary | ICD-10-CM

## 2017-05-16 DIAGNOSIS — I442 Atrioventricular block, complete: Secondary | ICD-10-CM

## 2017-05-16 DIAGNOSIS — I6523 Occlusion and stenosis of bilateral carotid arteries: Secondary | ICD-10-CM

## 2017-05-16 DIAGNOSIS — I10 Essential (primary) hypertension: Secondary | ICD-10-CM

## 2017-05-16 DIAGNOSIS — I420 Dilated cardiomyopathy: Secondary | ICD-10-CM

## 2017-05-16 DIAGNOSIS — E7800 Pure hypercholesterolemia, unspecified: Secondary | ICD-10-CM

## 2017-05-16 LAB — HEPATIC FUNCTION PANEL
ALK PHOS: 81 IU/L (ref 39–117)
ALT: 14 IU/L (ref 0–32)
AST: 17 IU/L (ref 0–40)
Albumin: 4.4 g/dL (ref 3.5–4.7)
BILIRUBIN TOTAL: 0.8 mg/dL (ref 0.0–1.2)
BILIRUBIN, DIRECT: 0.21 mg/dL (ref 0.00–0.40)
Total Protein: 6.8 g/dL (ref 6.0–8.5)

## 2017-05-16 LAB — BASIC METABOLIC PANEL
BUN / CREAT RATIO: 19 (ref 12–28)
BUN: 23 mg/dL (ref 8–27)
CHLORIDE: 100 mmol/L (ref 96–106)
CO2: 21 mmol/L (ref 18–29)
Calcium: 9.8 mg/dL (ref 8.7–10.3)
Creatinine, Ser: 1.21 mg/dL — ABNORMAL HIGH (ref 0.57–1.00)
GFR, EST AFRICAN AMERICAN: 47 mL/min/{1.73_m2} — AB (ref >59)
GFR, EST NON AFRICAN AMERICAN: 41 mL/min/{1.73_m2} — AB (ref >59)
Glucose: 133 mg/dL — ABNORMAL HIGH (ref 65–99)
Potassium: 4.9 mmol/L (ref 3.5–5.2)
Sodium: 139 mmol/L (ref 134–144)

## 2017-05-16 LAB — LIPID PANEL
CHOLESTEROL TOTAL: 148 mg/dL (ref 100–199)
Chol/HDL Ratio: 2.5 ratio (ref 0.0–4.4)
HDL: 59 mg/dL (ref >39)
LDL Calculated: 76 mg/dL (ref 0–99)
Triglycerides: 63 mg/dL (ref 0–149)
VLDL CHOLESTEROL CAL: 13 mg/dL (ref 5–40)

## 2017-05-16 NOTE — Progress Notes (Signed)
Cardiology Office Note    Date:  05/16/2017   ID:  [name], DOB 07-28-1928, MRN [id number]  PCP:  Mayra Neer, MD  Cardiologist:  Fransico Him, MD   Chief Complaint  Patient presents with  . Coronary Artery Disease  . Hypertension  . Hyperlipidemia  . Cardiomyopathy    History of Present Illness:  [name] is a 83 y.o. female who presents today for followup of her ischemic DCM, chronic diastolic CHF, ASCAD, dyslipidemia, MR s/p MV repair, and HTN. She is here for follow up and is doing well. She denies any chest pain or chest pressure.   She does have DOE when she walks in the house but she says that it is very stable and has not changed. She denies any  PND ororthopnea  She wakes up 4 times nightly to go to the bathroom.  She denies any LE edema,palpitations or syncope.  She occasionally will get dizzy when going from sitting to standing or standing for a while.    Past Medical History:  Diagnosis Date  . Anxiety    "when I come to the hospital; don't take any RX" (07/29/2013)  . Arthritis    "left wrist; back" (07/29/2013)  . Automatic implantable cardioverter-defibrillator in situ    placed in 2006; removed on 07/29/2013 (07/29/2013)  . Cancer [facility]) Sept. 2015   Left Hand  -  Skin Cancer  . Carotid stenosis    occluded right intracranial ICA followed by Dr. Donnetta Hutching.  1-39% bilateral ICA stenosis  . Cerebrovascular disease    extracranial  . Chronic combined systolic and diastolic CHF (congestive heart failure) ([place])   . Chronic kidney disease, unspecified   . Complete heart block ([place]) 07/29/2013   Medtronic Adapta L DR implanted with new RA and RV leads inserted  . Coronary artery disease    s/p CABG  . Fractured shoulder 2009   Right shoulder Fx. from a fall  . Heart murmur 05/14/2016  . Hyperlipidemia   . Hypertension   . Ischemic cardiomyopathy    s/p ICD (MDT) by Dr Leonia Reeves 2006, EF 24% - EF improved to 55% in 2014 and ICD downgraded to dual chamber  PPM.  EF declined to 20-25% by echo 2017 and decision made by EP to not upgrade device to ICD due to advanced age  . Osteoporosis   . Overactive bladder   . Postoperative atrial fibrillation ([place])   . Pulmonary HTN ([place])    severe by echo 04/2016 - likely Group 2 from pulmonary venous HTN secondary to LV dysfunction and MR  . Severe mitral regurgitation    s/p MV ring.  Moderate MR and mild mitral stenosis on echo 04/2016  . Stroke [facility]) 07/2005   "when they took me off heart/lung machine; affected my left foot" (07/29/2013)  . Type II diabetes mellitus ([place])     Past Surgical History:  Procedure Laterality Date  . ABDOMINAL HYSTERECTOMY  ? 1974  . APPENDECTOMY  1949  . CARDIAC CATHETERIZATION  07/2005  . CARDIAC DEFIBRILLATOR PLACEMENT  11/2005   by Dr Leonia Reeves (MDT) she has a [phone] fidelis lead  . CARDIAC VALVE REPLACEMENT  07/2005   "mitral valve" (07/29/2013)  . CATARACT EXTRACTION W/ INTRAOCULAR LENS  IMPLANT, BILATERAL Bilateral 1980's  . CORONARY ARTERY BYPASS GRAFT  07/2005   "CABG X3" (07/29/2013)  . JOINT REPLACEMENT Right 2006   Hip  . MITRAL VALVE REPLACEMENT  07/2005  . PACEMAKER PLACEMENT  07/29/2013  upgrade of single chamber ICD to Medtronic Adapta L DR implanted with new RA and RV leads inserted  . PERMANENT PACEMAKER INSERTION N/A 07/29/2013   Procedure: PERMANENT PACEMAKER INSERTION;  Surgeon: Thompson Grayer, MD;  Location[facility] C[facility] LAB;  Service: Cardiovascular;  Laterality: N/A;  . SALPINGOOPHORECTOMY Bilateral 1976  . SHOULDER ARTHROSCOPY W/ ROTATOR CUFF REPAIR Right 1998  . TOTAL HIP ARTHROPLASTY Right 2001    Current Medications: Current Meds  Medication Sig  . acetaminophen (TYLENOL) 500 MG tablet Take 500 mg by mouth daily as needed (pain).   Marland Kitchen aspirin EC 81 MG tablet Take 81 mg by mouth every morning.  . carvedilol (COREG) 12.5 MG tablet Take 12.5 mg by mouth 2 (two) times daily with a meal.   . Cholecalciferol (VITAMIN D) 2000 UNITS tablet Take 2,000 Units by  mouth daily.   Marland Kitchen ENTRESTO 24-26 MG TAKE 1 TABLET BY MOUTH TWICE A DAY  . fish oil-omega-3 fatty acids 1000 MG capsule Take 1 g by mouth 2 (two) times daily.   Marland Kitchen glipiZIDE (GLUCOTROL XL) 10 MG 24 hr tablet Take 20 mg by mouth daily.  . Multiple Vitamins-Minerals (CENTRUM SILVER PO) Take 1 tablet by mouth daily.   Marland Kitchen oxybutynin (DITROPAN) 5 MG tablet Take 1 tablet by mouth 2 (two) times daily.  . simvastatin (ZOCOR) 40 MG tablet TAKE 1 TABLET BY MOUTH AT BEDTIME  . spironolactone (ALDACTONE) 25 MG tablet Take 1 tablet (25 mg total) by mouth daily.  . vitamin B-12 (CYANOCOBALAMIN) 1000 MCG tablet Take 1,000 mcg by mouth daily.    Allergies:   Fosamax [alendronate]; Horse-derived products; Alendronate sodium; Clonidine derivatives; Darvocet [propoxyphene n-acetaminophen]; Lipitor [atorvastatin]; Sulfa drugs cross reactors; Tramadol; Vicodin [hydrocodone-acetaminophen]; Penicillins; and Septra [sulfamethoxazole-trimethoprim]   Social History   Social History  . Marital status: Widowed    Spouse name: N/A  . Number of children: N/A  . Years of education: N/A   Occupational History  . retired Marine scientist    Social History Main Topics  . Smoking status: Never Smoker  . Smokeless tobacco: Never Used  . Alcohol use No  . Drug use: No  . Sexual activity: No   Other Topics Concern  . None   Social History Narrative  . None     Family History:  The patient's family history includes Cancer in her mother and sister; Diabetes in her daughter and sister; Heart attack in her sister; Heart disease in her mother and sister; Hyperlipidemia in her sister.   ROS:   Please see the history of present illness.    ROS All other systems reviewed and are negative.  No flowsheet data found.     PHYSICAL EXAM:   VS:  BP 138/80   Pulse 86   Ht 5\' 2"  (1.575 m)   Wt 131 lb 12.8 oz (59.8 kg)   SpO2 99%   BMI 24.11 kg/m    GEN: Well nourished, well developed, in no acute distress  HEENT: normal    Neck: no JVD, carotid bruits, or masses Cardiac: RRR; no murmurs, rubs, or gallops,no edema.  Intact distal pulses bilaterally.  Respiratory:  clear to auscultation bilaterally, normal work of breathing GI: soft, nontender, nondistended, + BS MS: no deformity or atrophy  Skin: warm and dry, no rash Neuro:  Alert and Oriented x 3, Strength and sensation are intact Psych: euthymic mood, full affect  Wt Readings from Last 3 Encounters:  05/16/17 131 lb 12.8 oz (59.8 kg)  11/13/16 125 lb 14.4  oz (57.1 kg)  11/05/16 128 lb 12.8 oz (58.4 kg)      Studies/Labs Reviewed:   EKG:  EKG is not ordered today.    Recent Labs: 08/28/2016: B Natriuretic Peptide 456.0 12/18/2016: ALT 13; BUN 29; Creat 1.10; Potassium 4.6; Sodium 140   Lipid Panel    Component Value Date/Time   CHOL 139 12/18/2016 0852   TRIG 86 12/18/2016 0852   HDL 48 (L) 12/18/2016 0852   CHOLHDL 2.9 12/18/2016 0852   VLDL 17 12/18/2016 0852   LDLCALC 74 12/18/2016 0852    Additional studies/ records that were reviewed today include:  none    ASSESSMENT:    1. Chronic combined systolic and diastolic CHF (congestive heart failure) ([place])   2. Complete heart block ([place])   3. Coronary artery disease involving native coronary artery of native heart without angina pectoris   4. Essential hypertension   5. Severe mitral regurgitation   6. Bilateral carotid artery stenosis   7. Pure hypercholesterolemia      PLAN:  In order of problems listed above:  1. Chronic combined systolic/diastolic CHF - secondary to chronic ischemic CM - long standing.  Her EF had improved in 2014 to 55% and her ICD was replaced with a PPM in setting of CHB.  Repeat echo 2017 showed EF 20-25% with significant pulmonary HTN and RV failure.  she appears euvolemic on exam. Her weight is up 6lbs from 10/2016.  EF 20-25% with RV dysfunction and is followed in AHF service by Dr. Aundra Dubin. She has refused cath for further workup of DCM in the past and  has not had any CP so will continue with medical thearpy.  Continue BB/entresto and diuretic.  After discussion with EP it was decided not to upgrade PPM device to CRT.  Coreg 12.5mg  BID, Entresto 24-26mg  BID, aldactone 25mg  daily.  I will check a BMET.  2. Complete HB s/p PPM followed in pacer clinic.  3. ASCAD s/p remote CABG.  She has no anginal symptoms. Repeat echo 2017 showed EF 20-25% with significant pulmonary HTN and RV failure.  Nuclear stress test was negative for perfusion defects.  Cath was not pursued due to lack of symptoms.   She will continue on ASA 81mg  daily, Coreg 12.5mg  BID and statin.  4. HTN - BP controlled on current meds.  Continue BB/ARB and diuretic.   5. Severe MR s/p MV repair with last echo showing moderate MR and mild MS.  Will continue to follow.  Repeat echo to see if LVF and MR have improved after starting Entresto.   6. Carotid artery stenosis - 1-39% and occluded right intracranial ICA followed by Dr. Donnetta Hutching.  Continue statin and ASA.  7. Hyperlipidemia - LDL goal < 70. Continue simvastatin 40mg  daily.  She cannot afford the zetia. I will check FLP and ALT   8. Severe pulmonary HTN - likely Group 2 from pulmonary venous HTN from severe MR, LV dysfunction with increased filling pressures.  Chest CT angio was negative for PE.       Medication Adjustments/Labs and Tests Ordered: Current medicines are reviewed at length with the patient today.  Concerns regarding medicines are outlined above.  Medication changes, Labs and Tests ordered today are listed in the Patient Instructions below.  There are no Patient Instructions on file for this visit.   Signed, Fransico Him, MD  05/16/2017 9:16 AM    [facility] [address], [place], [place]  [address] Phone: [phone]; Fax: [phone]

## 2017-05-16 NOTE — Patient Instructions (Signed)
Medication Instructions:  Your physician recommends that you continue on your current medications as directed. Please refer to the Current Medication list given to you today.   Labwork: TODAY: BMET, LFTs, Lipids  Testing/Procedures: Your physician has requested that you have an echocardiogram. Echocardiography is a painless test that uses sound waves to create images of your heart. It provides your doctor with information about the size and shape of your heart and how well your heart's chambers and valves are working. This procedure takes approximately one hour. There are no restrictions for this procedure.  Follow-Up: Your physician wants you to follow-up in: 6 months with Dr. Radford Pax. You will receive a reminder letter in the mail two months in advance. If you don't receive a letter, please call our office to schedule the follow-up appointment.   Any Other Special Instructions Will Be Listed Below (If Applicable).     If you need a refill on your cardiac medications before your next appointment, please call your pharmacy.

## 2017-05-23 ENCOUNTER — Telehealth: Payer: Self-pay

## 2017-05-23 DIAGNOSIS — E785 Hyperlipidemia, unspecified: Secondary | ICD-10-CM

## 2017-05-23 NOTE — Telephone Encounter (Signed)
Informed patient of results and verbal understanding expressed.  Patient declines starting Zetia at this time. She will limit her fats and carbs in her diet and repeat fasting labs in 4 months. Patient agrees with treatment plan and was grateful for call.

## 2017-05-23 NOTE — Telephone Encounter (Signed)
-----   Message from Sueanne Margarita, MD sent at 05/16/2017  4:57 PM EDT ----- LDL not at goal - add Zetia 10mg  daily and repeat FLP and ALT in 8 weeks

## 2017-05-30 ENCOUNTER — Ambulatory Visit (HOSPITAL_COMMUNITY)
Admission: RE | Admit: 2017-05-30 | Discharge: 2017-05-30 | Disposition: A | Discharge status: Home / Self Care | Payer: Medicare Other | Source: Ambulatory Visit | Attending: Cardiology | Admitting: Cardiology

## 2017-05-30 ENCOUNTER — Encounter (HOSPITAL_COMMUNITY): Payer: Self-pay

## 2017-05-30 VITALS — BP 151/64 | HR 71 | Wt 133.0 lb

## 2017-05-30 DIAGNOSIS — I6529 Occlusion and stenosis of unspecified carotid artery: Secondary | ICD-10-CM | POA: Insufficient documentation

## 2017-05-30 DIAGNOSIS — E1122 Type 2 diabetes mellitus with diabetic chronic kidney disease: Secondary | ICD-10-CM | POA: Insufficient documentation

## 2017-05-30 DIAGNOSIS — I442 Atrioventricular block, complete: Secondary | ICD-10-CM | POA: Diagnosis not present

## 2017-05-30 DIAGNOSIS — Z7982 Long term (current) use of aspirin: Secondary | ICD-10-CM | POA: Insufficient documentation

## 2017-05-30 DIAGNOSIS — I5022 Chronic systolic (congestive) heart failure: Secondary | ICD-10-CM | POA: Insufficient documentation

## 2017-05-30 DIAGNOSIS — Z7984 Long term (current) use of oral hypoglycemic drugs: Secondary | ICD-10-CM | POA: Insufficient documentation

## 2017-05-30 DIAGNOSIS — Z951 Presence of aortocoronary bypass graft: Secondary | ICD-10-CM | POA: Insufficient documentation

## 2017-05-30 DIAGNOSIS — I255 Ischemic cardiomyopathy: Secondary | ICD-10-CM | POA: Insufficient documentation

## 2017-05-30 DIAGNOSIS — I5042 Chronic combined systolic (congestive) and diastolic (congestive) heart failure: Secondary | ICD-10-CM

## 2017-05-30 DIAGNOSIS — Z952 Presence of prosthetic heart valve: Secondary | ICD-10-CM | POA: Insufficient documentation

## 2017-05-30 DIAGNOSIS — I2510 Atherosclerotic heart disease of native coronary artery without angina pectoris: Secondary | ICD-10-CM | POA: Insufficient documentation

## 2017-05-30 DIAGNOSIS — I130 Hypertensive heart and chronic kidney disease with heart failure and stage 1 through stage 4 chronic kidney disease, or unspecified chronic kidney disease: Secondary | ICD-10-CM | POA: Insufficient documentation

## 2017-05-30 DIAGNOSIS — E1140 Type 2 diabetes mellitus with diabetic neuropathy, unspecified: Secondary | ICD-10-CM | POA: Diagnosis not present

## 2017-05-30 DIAGNOSIS — N189 Chronic kidney disease, unspecified: Secondary | ICD-10-CM | POA: Diagnosis not present

## 2017-05-30 DIAGNOSIS — Z96641 Presence of right artificial hip joint: Secondary | ICD-10-CM | POA: Diagnosis not present

## 2017-05-30 DIAGNOSIS — I272 Pulmonary hypertension, unspecified: Secondary | ICD-10-CM | POA: Diagnosis not present

## 2017-05-30 DIAGNOSIS — E785 Hyperlipidemia, unspecified: Secondary | ICD-10-CM | POA: Diagnosis not present

## 2017-05-30 DIAGNOSIS — Z8673 Personal history of transient ischemic attack (TIA), and cerebral infarction without residual deficits: Secondary | ICD-10-CM | POA: Insufficient documentation

## 2017-05-30 DIAGNOSIS — I340 Nonrheumatic mitral (valve) insufficiency: Secondary | ICD-10-CM | POA: Diagnosis present

## 2017-05-30 MED ORDER — SACUBITRIL-VALSARTAN 49-51 MG PO TABS: 1.0000 | ORAL_TABLET | Freq: Two times a day (BID) | ORAL | 6 refills | Status: DC

## 2017-05-30 NOTE — Patient Instructions (Addendum)
Increase Entresto to 49/51 mg Twice daily   Labs in 10 days at [facility] office, when you have echo done  We will contact you in 4 months to schedule your next appointment.

## 2017-05-30 NOTE — Progress Notes (Signed)
Patient ID: [name], female   DOB: Mar 26, 1928, 83 y.o.   MRN: [id number] PCP: Dr Raliegh Ip. [facility] Cardiology: Dr. Radford Pax HF Cardiology: Dr. Aundra Dubin  83 yo with history of CAD s/p CABG, mitral regurgitation s/p MV repair, ischemic cardiomyopathy, complete heart block, and carotid stenosis presents for CHF clinic followup.  She had CABG + MVR in 2008.  She developed ischemic cardiomyopathy and had a Medtronic ICD placed.  However, by 2014, EF had increased back to 55%.  She developed complete heart block and the ICD was replaced with a dual chamber PPM.  She had a repeat echo due to louder murmur and EF was noted to have decreased to 20-25% with severe LV dilation, RV dysfunction, and severe pulmonary hypertension.  She had moderate MR (in setting of repaired mitral valve) and probably mild mitral stenosis.  Because of the fall in EF, she had a Cardiolite which showed no perfusion abnormalities.  She was referred to CHF clinic for evaluation.  She saw Dr. Rayann Heman; they decided against CRT upgrade as her main issue seems to be stability rather than dyspnea/HF.   She lives by herself although family is around to help.  She continues to be unsteady on her feet so uses a cane or walker (at times).  No falls.  She is "dizzy" frequently. By description, this is a spinning sensation that sounds like vertigo.  She was not orthostatic when we checked today.  She can walk about 1/2 block before becoming short of breath.  This is chronic with no change.  No orthopnea/PND, no chest pain, no palpitations.  She does not take Lasix.  She has a walker but rarely uses it.  BP is elevated today. SBP 130s-140s at home.   Labs (5/17): K 4.5, creatinine 0.92, LDL 52 Labs (6/17): BNP 467 Labs (7/17): K 4.7, creatinine 1.23 Labs (9/17): K 4.8, creatinine 1.16, LDL 75, HDL 48 Labs (5/18): K 4.9, creatinine 1.21, LDL 76, HDL 59  PMH: 1. Carotid disease: Totally occluded RICA, followed by Dr. Donnetta Hutching.  - Carotid dopplers (11/17):  Totally occluded RICA, < 62% LICA stenosis.  2. Type II diabetes 3. Hyperlipidemia 4. HTN 5. Mitral regurgitation: S/p mitral valve repair with CABG in 2008.  6. CAD: CABG x 3 + MV repair in 2008.  Cardiolite (6/17) with EF 38%, no perfusion abnormality.  7. Chronic systolic CHF: Ischemic cardiomyopathy.  She had a Medtronic ICD, then EF improved and this was replaced with a dual chamber Medtronic PPM when she later developed CHB. - Echo (2014) with EF 55%.  - Echo (5/17): severe LV dilation, EF 20-25%, diffuse hypokinesis, grade II diastolic dysfunction, mild AI, s/p MV repair with moderate MR and probably mild stenosis (mean gradient 7 mmHg but pressure halftime not elevated), RV moderately dilated with moderate-severe RV dysfunction, PASP 83 mmHg.   8. Complete heart block: Dual chamber Medtronic PPM.  9. S/p CVA 10. CKD 11. Neuropathy.  12. S/p THR  Social History   Social History  . Marital status: Widowed    Spouse name: N/A  . Number of children: N/A  . Years of education: N/A   Occupational History  . retired Marine scientist    Social History Main Topics  . Smoking status: Never Smoker  . Smokeless tobacco: Never Used  . Alcohol use No  . Drug use: No  . Sexual activity: No   Other Topics Concern  . Not on file   Social History Narrative  . No narrative on  file   Family History  Problem Relation Age of Onset  . Cancer Mother        Theadora Rama   . Heart disease Mother        Before age 73  . Heart disease Sister        Before age 60-  CABG  . Cancer Sister        Lonia Chimera  . Diabetes Sister   . Hyperlipidemia Sister   . Heart attack Sister   . Diabetes Daughter   . Coronary artery disease Unknown        family hx of  . Hypertension Unknown        family hx of  . Diabetes Unknown        family hx of   ROS: All systems reviewed and negative except as per HPI.   Current Outpatient Prescriptions  Medication Sig Dispense Refill  . acetaminophen (TYLENOL) 500 MG  tablet Take 500 mg by mouth daily as needed (pain).     Marland Kitchen aspirin EC 81 MG tablet Take 81 mg by mouth every morning.    . carvedilol (COREG) 12.5 MG tablet Take 12.5 mg by mouth 2 (two) times daily with a meal.     . Cholecalciferol (VITAMIN D) 2000 UNITS tablet Take 2,000 Units by mouth daily.     . fish oil-omega-3 fatty acids 1000 MG capsule Take 1 g by mouth 2 (two) times daily.     Marland Kitchen glipiZIDE (GLUCOTROL XL) 10 MG 24 hr tablet Take 20 mg by mouth daily.    . Multiple Vitamins-Minerals (CENTRUM SILVER PO) Take 1 tablet by mouth daily.     Marland Kitchen oxybutynin (DITROPAN) 5 MG tablet Take 1 tablet by mouth 2 (two) times daily.  3  . simvastatin (ZOCOR) 40 MG tablet TAKE 1 TABLET BY MOUTH AT BEDTIME 30 tablet 8  . spironolactone (ALDACTONE) 25 MG tablet Take 1 tablet (25 mg total) by mouth daily. 90 tablet 3  . vitamin B-12 (CYANOCOBALAMIN) 1000 MCG tablet Take 1,000 mcg by mouth daily.    . sacubitril-valsartan (ENTRESTO) 49-51 MG Take 1 tablet by mouth 2 (two) times daily. 60 tablet 6   No current facility-administered medications for this encounter.    BP (!) 151/64   Pulse 71   Wt 133 lb (60.3 kg)   SpO2 98%   BMI 24.33 kg/m  General: NAD Neck: JVP 7 cm, no thyromegaly or thyroid nodule.  Lungs: CTAB CV: Nondisplaced PMI.  Heart regular S1/S2, no S3/S4, 1/6 HSM apex.  No peripheral edema.  No carotid bruit.  Normal pedal pulses.  Abdomen: Soft, nontender, no hepatosplenomegaly, no distention.  Skin: Intact without lesions or rashes.  Neurologic: Alert and oriented x 3.  Psych: Normal affect. Extremities: No clubbing or cyanosis.  HEENT: Normal.   Assessment/Plan: 1. Chronic systolic CHF: Ischemic cardiomyopathy.  Long-standing.  EF had improved to 55% by 2014 and ICD was removed and replaced with dual chamber PPM in the setting of CHB.  However, echo was done in 5/17 for increased murmur and decline in EF to 20-25% with RV dysfunction was noted.  She has had no chest pain and no change  her in chronic NYHA class II-III symptoms (mainly fatigue).  She is not volume overloaded on exam.  It is certainly possible that EF has fallen with worsening coronary disease though no perfusion defects were noted on 6/17 Cardiolite.    -  I think that it is reasonable to hold off  on cardiac catheterization given lack of symptomatic change, especially with her advanced age.  She is willing to adjust her medications as needed.  - Increase Entresto to 49/51 bid (she is not orthostatic and BP is upper normal to elevated).  BMET in 10 days.    - Continue Coreg and spironolactone at current doses.  - She is not volume overloaded on exam, I do not think that she needs Lasix.  - Discussed with Dr Rayann Heman, decided against BiV upgrade. 2. CAD: Stable with no chest pain.  EF has fallen recently but Cardiolite showed no ischemia.  She wants to avoid procedures so will hold off on cath.  - Continue ASA 81 daily and statin.  3. Carotid stenosis: RICA is occluded, followed by Dr Donnetta Hutching.  4. Complete heart block: Medtronic PPM.  5. Mitral regurgitation: S/p MV repair.  Moderate MR on last echo with probably mild MS (gradient elevated but pressure halftime is not).   6. Hyperlipidemia: Recent LDL 76 on simvastatin 40 daily.  She does not want to take Zetia, LDL looks near enough to goal to hold off on Zetia.   Followup in 4 months.   Loralie Champagne 05/30/2017

## 2017-06-06 ENCOUNTER — Other Ambulatory Visit: Payer: Self-pay

## 2017-06-06 ENCOUNTER — Other Ambulatory Visit: Payer: Medicare Other

## 2017-06-06 ENCOUNTER — Ambulatory Visit (HOSPITAL_COMMUNITY): Payer: Medicare Other | Attending: Cardiology

## 2017-06-06 DIAGNOSIS — I4891 Unspecified atrial fibrillation: Secondary | ICD-10-CM | POA: Diagnosis not present

## 2017-06-06 DIAGNOSIS — I340 Nonrheumatic mitral (valve) insufficiency: Secondary | ICD-10-CM | POA: Diagnosis present

## 2017-06-06 DIAGNOSIS — N189 Chronic kidney disease, unspecified: Secondary | ICD-10-CM | POA: Insufficient documentation

## 2017-06-06 DIAGNOSIS — I6529 Occlusion and stenosis of unspecified carotid artery: Secondary | ICD-10-CM | POA: Diagnosis not present

## 2017-06-06 DIAGNOSIS — I081 Rheumatic disorders of both mitral and tricuspid valves: Secondary | ICD-10-CM | POA: Diagnosis not present

## 2017-06-06 DIAGNOSIS — I442 Atrioventricular block, complete: Secondary | ICD-10-CM | POA: Insufficient documentation

## 2017-06-06 DIAGNOSIS — I2510 Atherosclerotic heart disease of native coronary artery without angina pectoris: Secondary | ICD-10-CM | POA: Diagnosis not present

## 2017-06-06 DIAGNOSIS — I5043 Acute on chronic combined systolic (congestive) and diastolic (congestive) heart failure: Secondary | ICD-10-CM

## 2017-06-06 DIAGNOSIS — I5042 Chronic combined systolic (congestive) and diastolic (congestive) heart failure: Secondary | ICD-10-CM | POA: Diagnosis present

## 2017-06-06 DIAGNOSIS — I420 Dilated cardiomyopathy: Secondary | ICD-10-CM

## 2017-06-06 DIAGNOSIS — E785 Hyperlipidemia, unspecified: Secondary | ICD-10-CM | POA: Diagnosis not present

## 2017-06-06 DIAGNOSIS — E1122 Type 2 diabetes mellitus with diabetic chronic kidney disease: Secondary | ICD-10-CM | POA: Diagnosis not present

## 2017-06-06 DIAGNOSIS — I371 Nonrheumatic pulmonary valve insufficiency: Secondary | ICD-10-CM | POA: Insufficient documentation

## 2017-06-06 LAB — BASIC METABOLIC PANEL
BUN/Creatinine Ratio: 21 (ref 12–28)
BUN: 20 mg/dL (ref 8–27)
CALCIUM: 10.1 mg/dL (ref 8.7–10.3)
CO2: 23 mmol/L (ref 18–29)
Chloride: 104 mmol/L (ref 96–106)
Creatinine, Ser: 0.95 mg/dL (ref 0.57–1.00)
GFR calc Af Amer: 63 mL/min/{1.73_m2} (ref >59)
GFR calc non Af Amer: 54 mL/min/{1.73_m2} — ABNORMAL LOW (ref >59)
Glucose: 117 mg/dL — ABNORMAL HIGH (ref 65–99)
POTASSIUM: 5 mmol/L (ref 3.5–5.2)
Sodium: 141 mmol/L (ref 134–144)

## 2017-06-10 ENCOUNTER — Ambulatory Visit (INDEPENDENT_AMBULATORY_CARE_PROVIDER_SITE_OTHER): Payer: Medicare Other | Admitting: *Deleted

## 2017-06-10 DIAGNOSIS — I442 Atrioventricular block, complete: Secondary | ICD-10-CM

## 2017-06-10 NOTE — Progress Notes (Signed)
Remote pacemaker transmission.   

## 2017-06-11 LAB — CUP PACEART REMOTE DEVICE CHECK
Battery Impedance: 159 Ohm
Brady Statistic AP VP Percent: 15 %
Brady Statistic AP VS Percent: 84 %
Brady Statistic AS VS Percent: 1 %
Date Time Interrogation Session: 20180611143130
Implantable Lead Implant Date: 20140730
Implantable Lead Location: 753860
Implantable Lead Model: 5076
Lead Channel Impedance Value: 459 Ohm
Lead Channel Pacing Threshold Pulse Width: 0.4 ms
Lead Channel Setting Pacing Amplitude: 2.5 V
MDC IDC LEAD IMPLANT DT: 20140730
MDC IDC LEAD LOCATION: 753859
MDC IDC MSMT BATTERY REMAINING LONGEVITY: 123 mo
MDC IDC MSMT BATTERY VOLTAGE: 2.78 V
MDC IDC MSMT LEADCHNL RA IMPEDANCE VALUE: 411 Ohm
MDC IDC MSMT LEADCHNL RA PACING THRESHOLD AMPLITUDE: 0.5 V
MDC IDC MSMT LEADCHNL RV PACING THRESHOLD AMPLITUDE: 0.75 V
MDC IDC MSMT LEADCHNL RV PACING THRESHOLD PULSEWIDTH: 0.4 ms
MDC IDC PG IMPLANT DT: 20140730
MDC IDC SET LEADCHNL RA PACING AMPLITUDE: 2 V
MDC IDC SET LEADCHNL RV PACING PULSEWIDTH: 0.4 ms
MDC IDC SET LEADCHNL RV SENSING SENSITIVITY: 2.8 mV
MDC IDC STAT BRADY AS VP PERCENT: 0 %

## 2017-06-12 ENCOUNTER — Encounter: Payer: Self-pay | Admitting: Cardiology

## 2017-06-12 ENCOUNTER — Other Ambulatory Visit: Payer: Self-pay | Admitting: Cardiology

## 2017-06-18 ENCOUNTER — Other Ambulatory Visit: Payer: Self-pay | Admitting: Cardiology

## 2017-08-20 IMAGING — RF DG ESOPHAGUS
7 series · 14 of 21 positions shown · non-contrast
Comparison: Today is modified swallow, dictated separately.

CLINICAL DATA: Dysphagia.

EXAM:
ESOPHOGRAM/BARIUM SWALLOW
TECHNIQUE: Single contrast examination was performed using  thin barium.
FLUOROSCOPY TIME:  If the device does not provide the exposure
index:
Fluoroscopy Time:  3 minutes and 30 seconds
Number of Acquired Images:  None

[Series 1: cp_standard · 0.39mm/px · 3 of 73 frames shown (1 of 7)]
[frame 11/73]
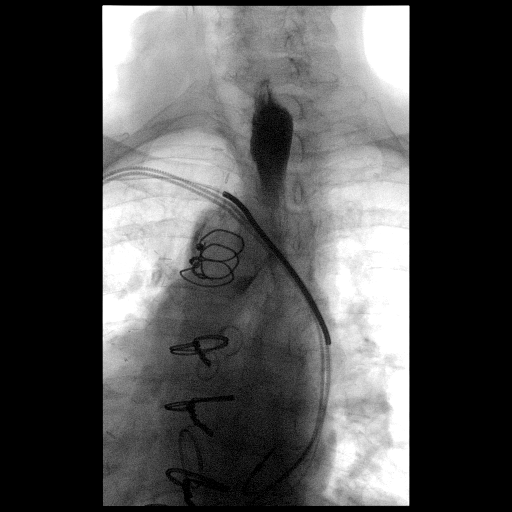
[frame 63/73]
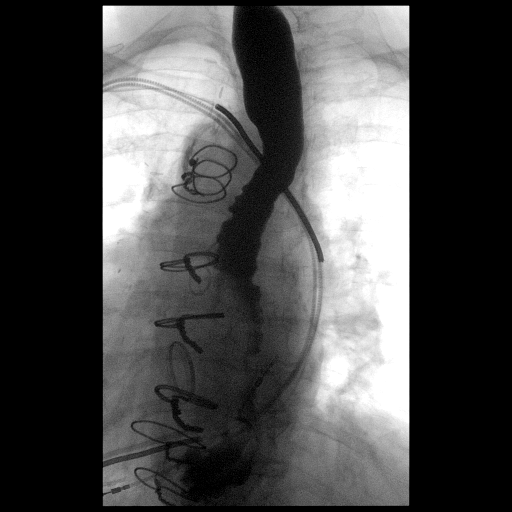
[frame 70/73]
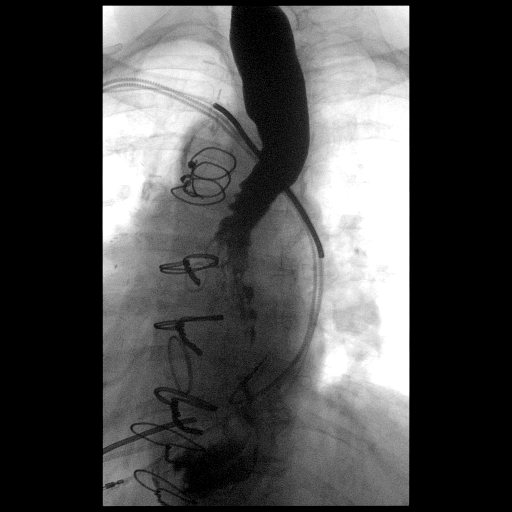

[Series 2: cp_standard · 0.39mm/px · 2 of 147 frames shown (2 of 7)]
[frame 74/147]
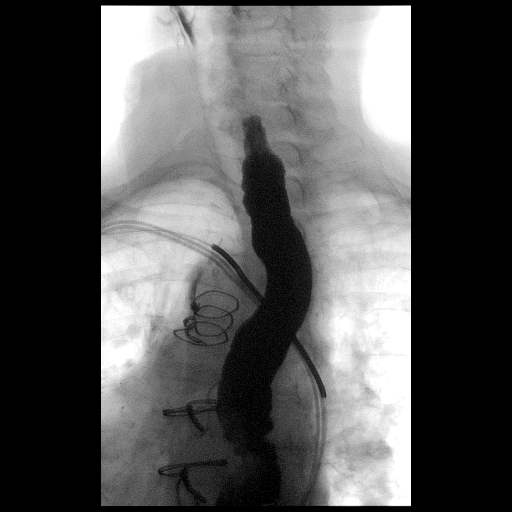
[frame 125/147]
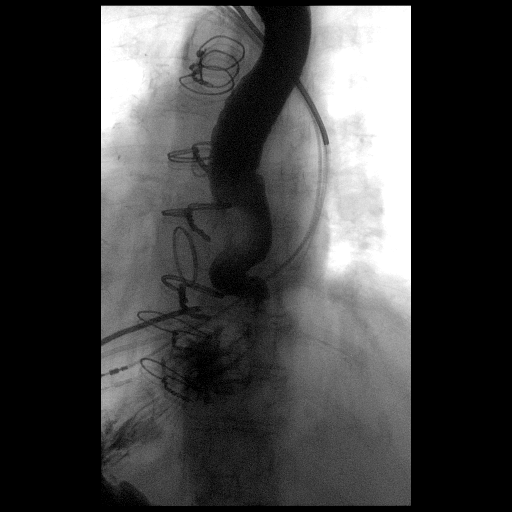

[Series 3: cp_standard · 0.39mm/px · 2 of 76 frames shown (3 of 7)]
[frame 12/76]
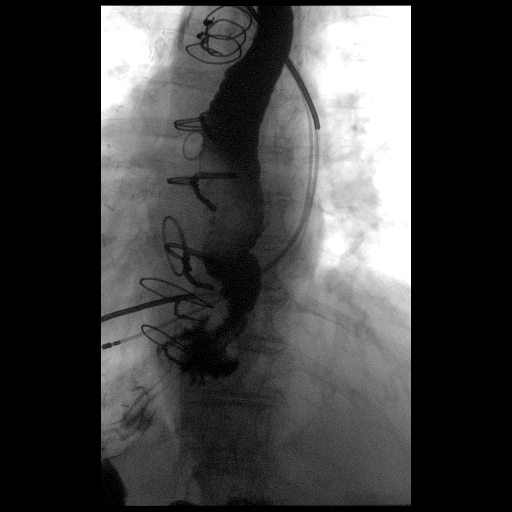
[frame 39/76]
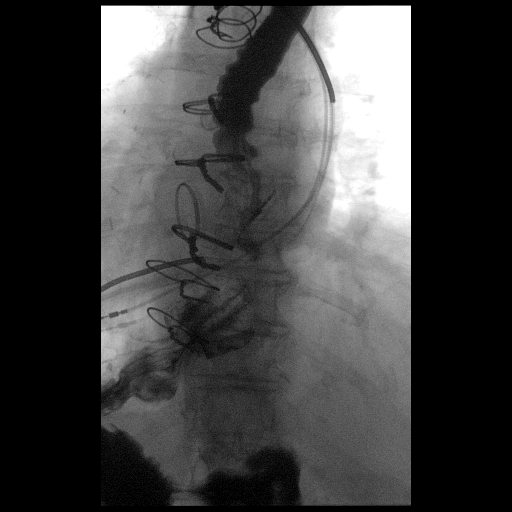

[Series 4: cp_standard · 0.20mm/px · 1 of 1 slices shown (4 of 7)]
[im 1/1]
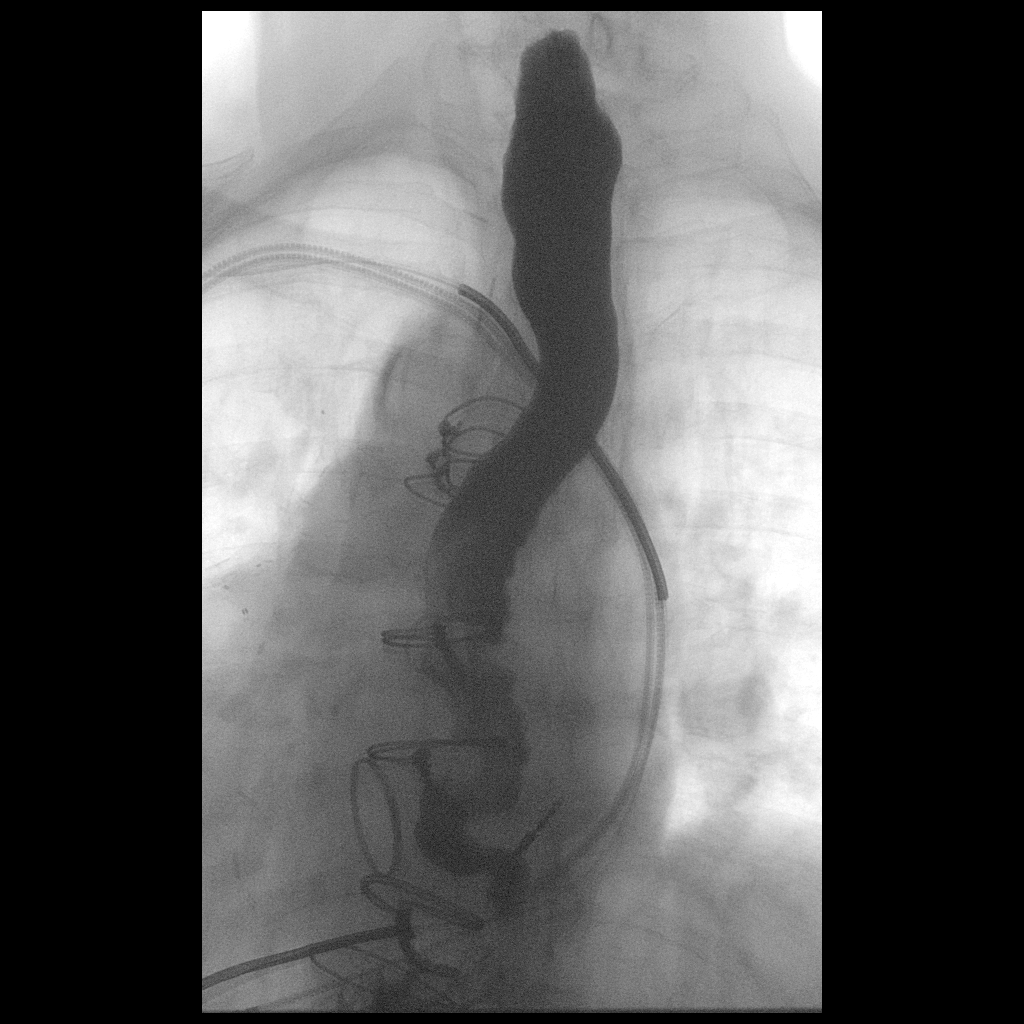

[Series 5: cp_standard · 0.39mm/px · 3 of 143 frames shown (5 of 7)]
[frame 22/143]
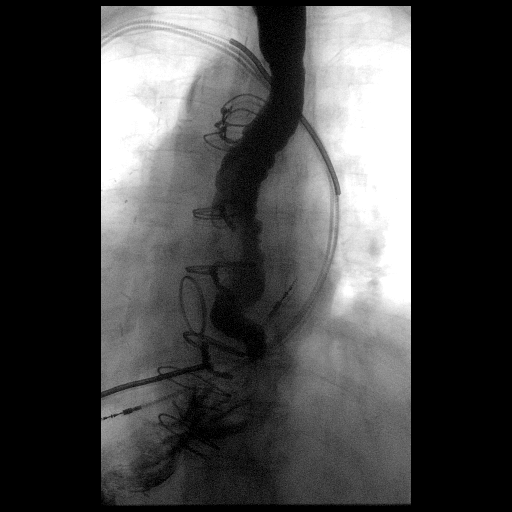
[frame 122/143]
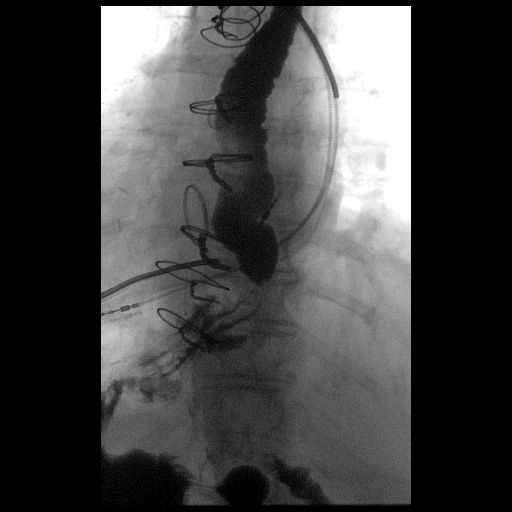
[frame 137/143]
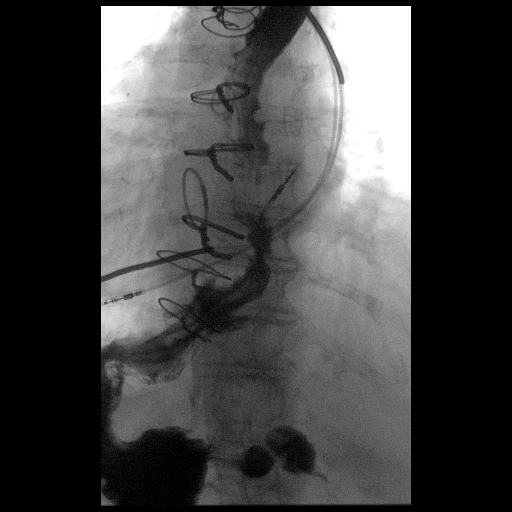

[Series 6: cp_standard · 0.39mm/px · 2 of 131 frames shown (6 of 7)]
[frame 20/131]
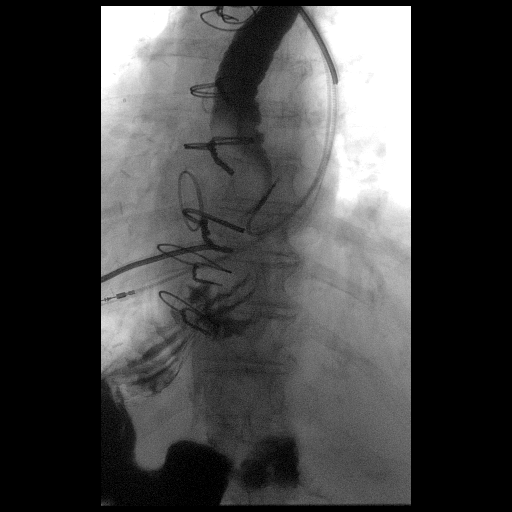
[frame 66/131]
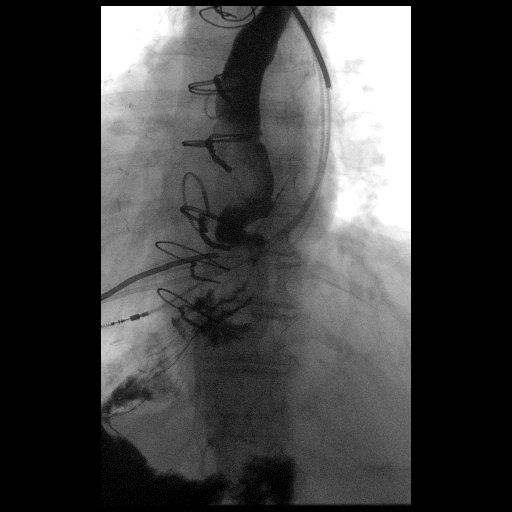

[Series 7: cp_standard · 0.20mm/px · 1 of 1 slices shown (7 of 7)]
[im 1/1]
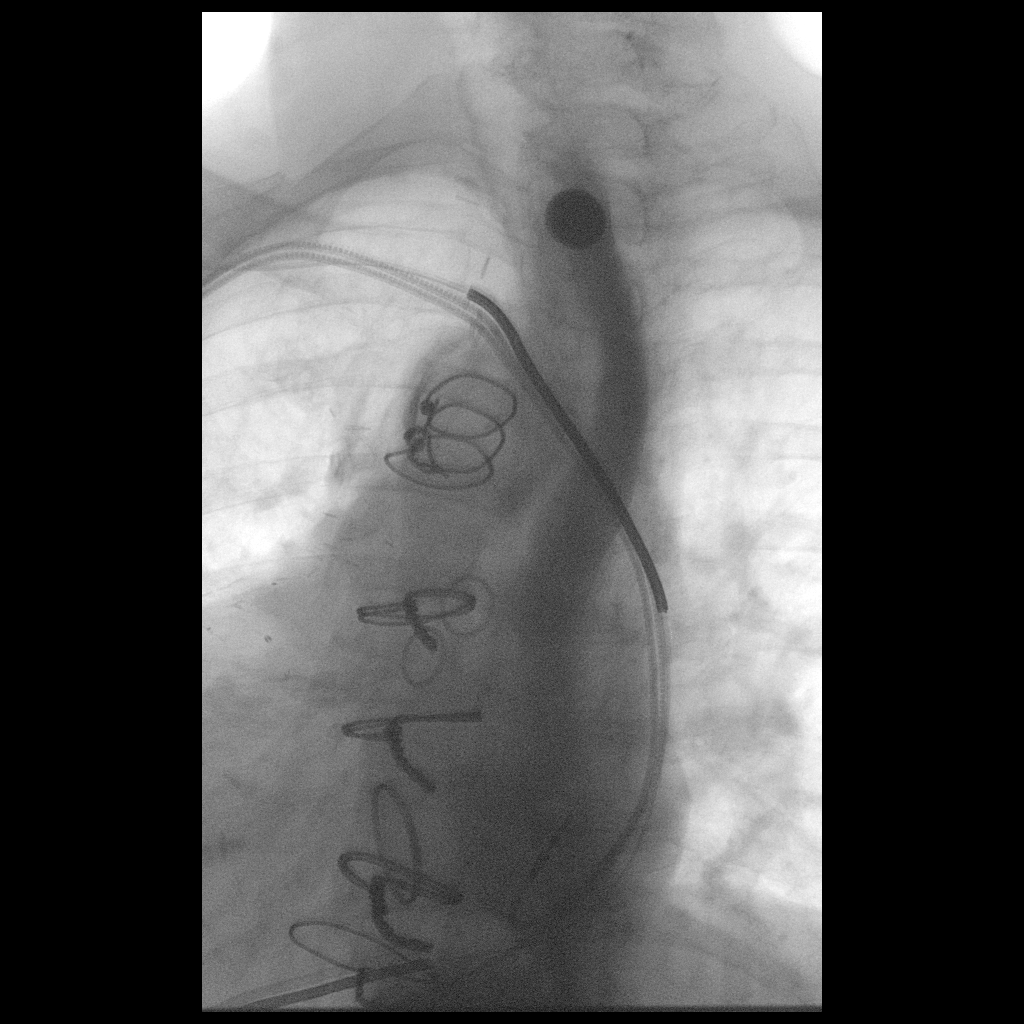

[14 of 21 positions shown; findings below may reference images not displayed]

FINDINGS: A focused single-contrast exam was performed, secondary to patient
age and immobility. Single-contrast exam in a left posterior oblique
position demonstrates esophageal dysmotility, severe. Contrast
stasis throughout the entire thoracic esophagus.

Full column evaluation of the upper and mid thoracic esophagus is
unremarkable, without evidence of stricture. Mildly suboptimal
evaluation of the distal esophagus, secondary to underdistention. No
convincing evidence of persistent narrowing or stricture. Note is
made of a pacer and AICD device.

Delayed passage of a barium tablet within the upper esophagus.
IMPRESSION: 1. Moderate degradation, secondary to clinical factors detailed
above.
2. Severe esophageal dysmotility, likely presbyesophagus.
3. No convincing evidence of esophageal stricture other anatomic
cause for dysphagia.

## 2017-08-20 IMAGING — RF DG SWALLOWING FUNCTION
1 series · 17 of 24 positions shown · non-contrast
Comparison: Chest radiograph of 01/18/2014.

CLINICAL DATA: Dysphagia.

EXAM:
MODIFIED BARIUM SWALLOW
TECHNIQUE: Different consistencies of barium were administered orally to the
patient by the Speech Pathologist. Imaging of the pharynx was
performed in the lateral projection.
FLUOROSCOPY TIME:  Fluoroscopy Time:  37 seconds
Number of Acquired Images:  None

[Series 1: run · 9 acquisitions, 17 frames shown]
[im 1/9]
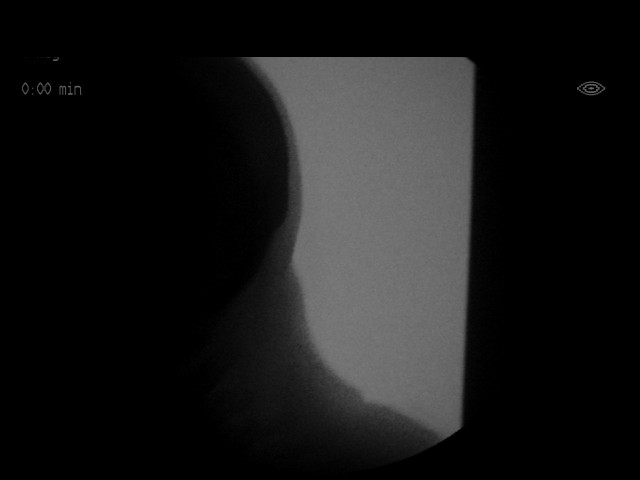
[im 1/9]
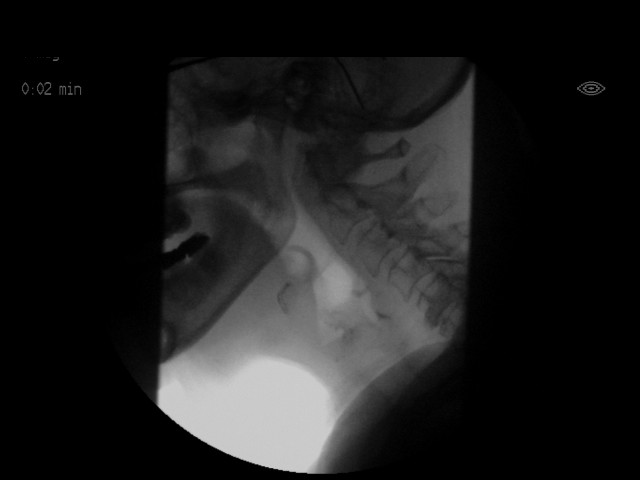
[im 2/9]
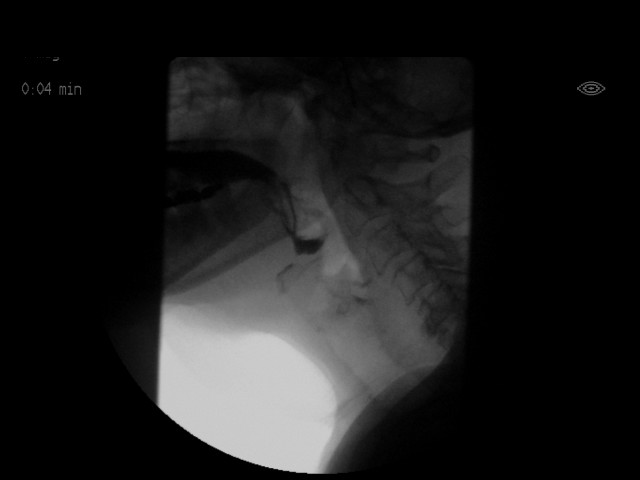
[im 2/9]
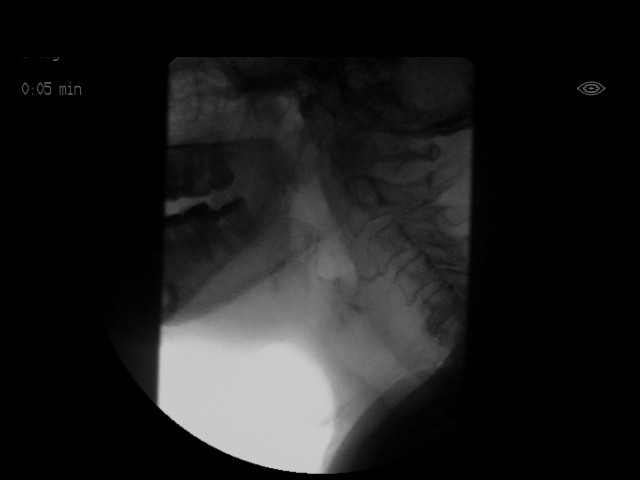
[im 3/9]
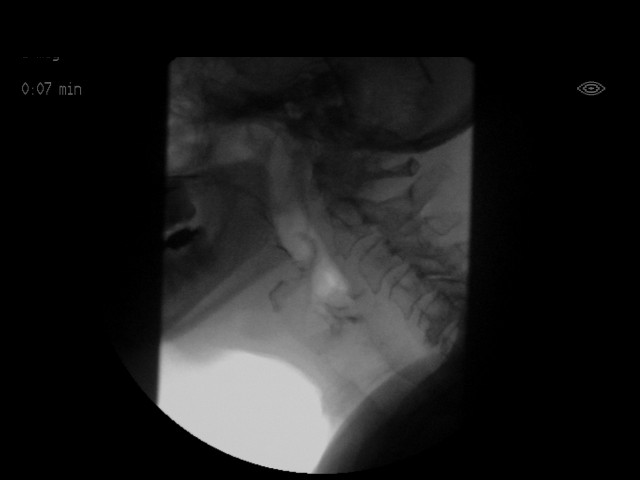
[im 3/9]
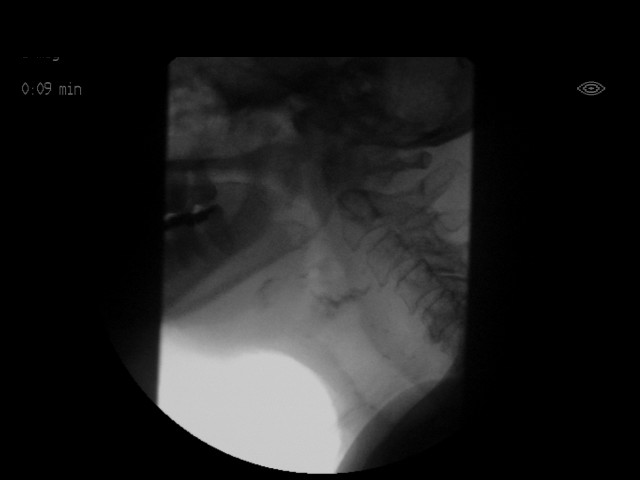
[im 4/9]
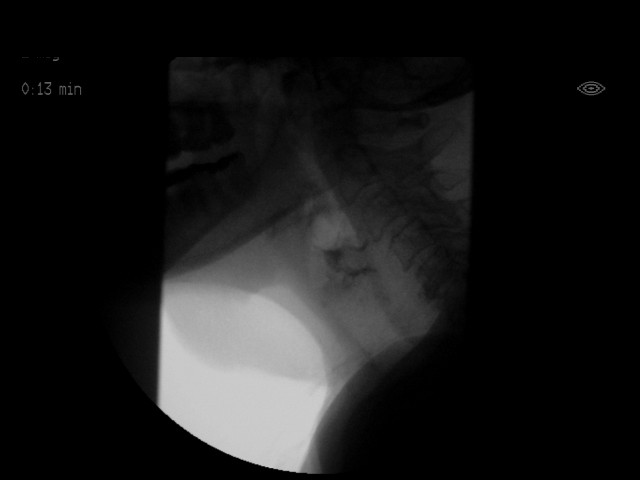
[im 4/9]
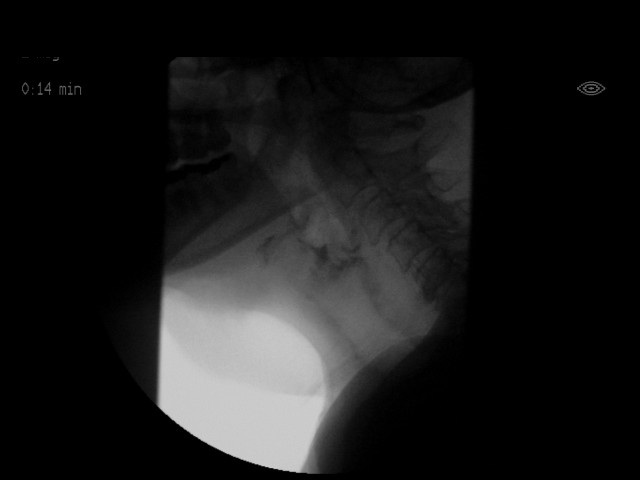
[im 5/9]
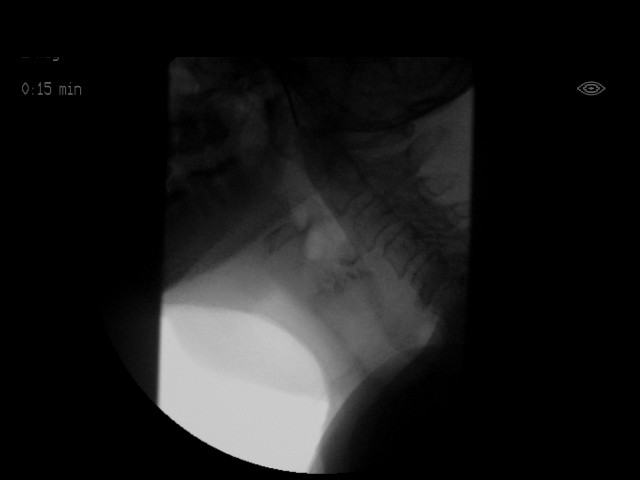
[im 6/9]
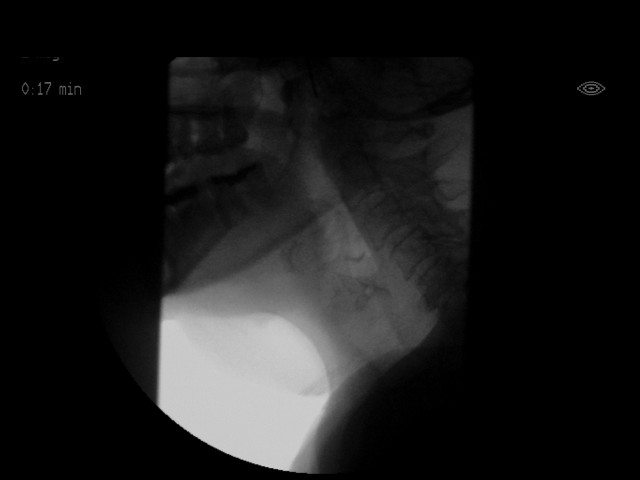
[im 6/9]
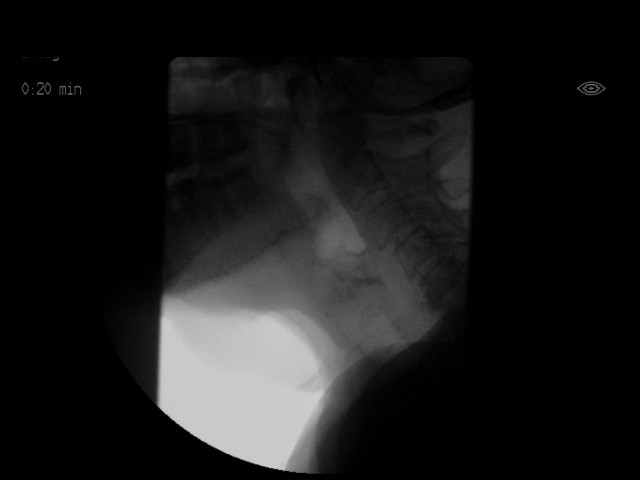
[im 7/9]
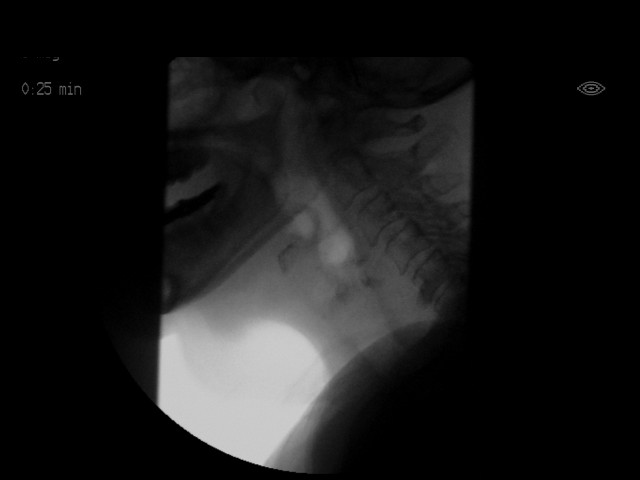
[im 7/9]
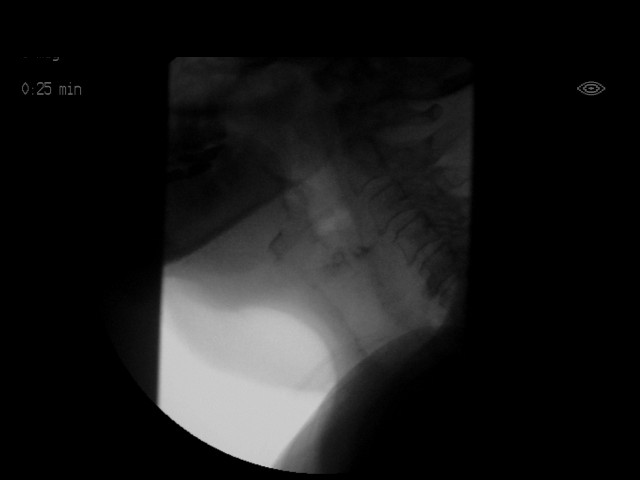
[im 8/9]
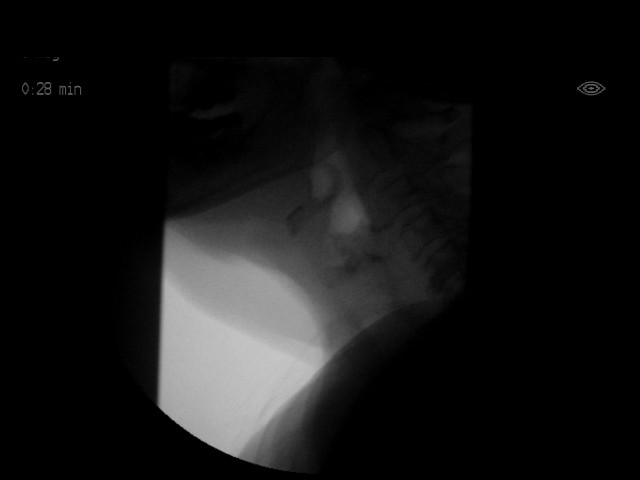
[im 8/9]
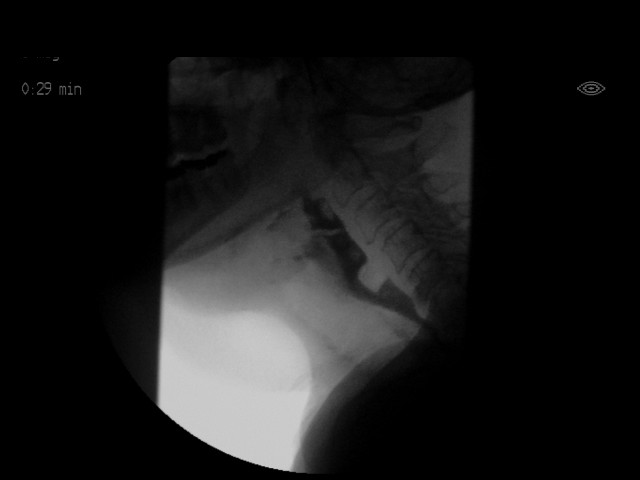
[im 9/9]
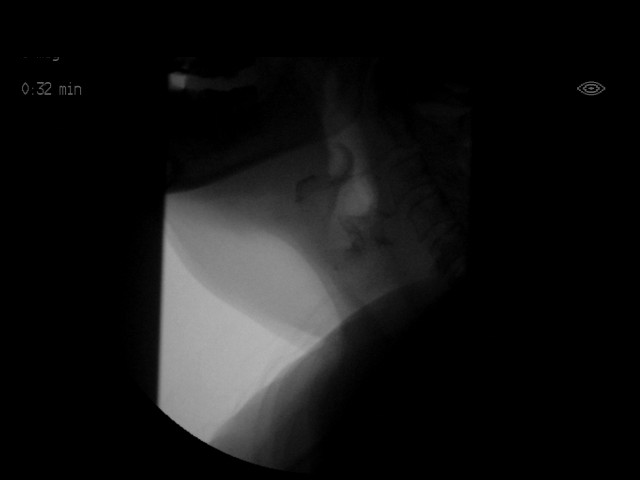
[im 9/9]
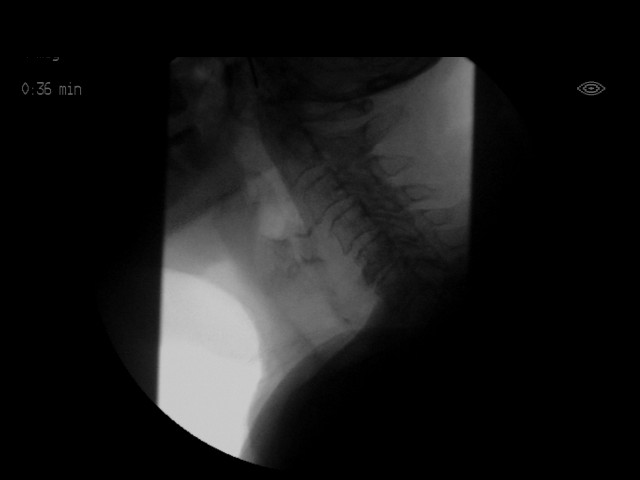

[17 of 24 positions shown; findings below may reference images not displayed]

FINDINGS: Thin liquid- within normal limits when drank from a cup. Minimal
retention and delayed swallow trigger when drank from a straw.

[place] with cracker- within normal limits
IMPRESSION: Minimal swallow dysfunction, as above.

Please refer to the Speech Pathologists report for complete details
and recommendations.

## 2017-08-21 ENCOUNTER — Emergency Department (HOSPITAL_COMMUNITY)
Admission: EM | Admit: 2017-08-21 | Discharge: 2017-08-21 | Disposition: A | Discharge status: Home / Self Care | Payer: Medicare Other | Attending: Emergency Medicine | Admitting: Emergency Medicine

## 2017-08-21 ENCOUNTER — Emergency Department (HOSPITAL_COMMUNITY): Payer: Medicare Other

## 2017-08-21 ENCOUNTER — Encounter (HOSPITAL_COMMUNITY): Payer: Self-pay | Admitting: Emergency Medicine

## 2017-08-21 DIAGNOSIS — W19XXXA Unspecified fall, initial encounter: Secondary | ICD-10-CM

## 2017-08-21 DIAGNOSIS — M25511 Pain in right shoulder: Secondary | ICD-10-CM | POA: Diagnosis not present

## 2017-08-21 DIAGNOSIS — Z7984 Long term (current) use of oral hypoglycemic drugs: Secondary | ICD-10-CM | POA: Insufficient documentation

## 2017-08-21 DIAGNOSIS — E1122 Type 2 diabetes mellitus with diabetic chronic kidney disease: Secondary | ICD-10-CM | POA: Diagnosis not present

## 2017-08-21 DIAGNOSIS — Z7982 Long term (current) use of aspirin: Secondary | ICD-10-CM | POA: Insufficient documentation

## 2017-08-21 DIAGNOSIS — Z85828 Personal history of other malignant neoplasm of skin: Secondary | ICD-10-CM | POA: Insufficient documentation

## 2017-08-21 DIAGNOSIS — I5042 Chronic combined systolic (congestive) and diastolic (congestive) heart failure: Secondary | ICD-10-CM | POA: Insufficient documentation

## 2017-08-21 DIAGNOSIS — Y9389 Activity, other specified: Secondary | ICD-10-CM | POA: Diagnosis not present

## 2017-08-21 DIAGNOSIS — N189 Chronic kidney disease, unspecified: Secondary | ICD-10-CM | POA: Insufficient documentation

## 2017-08-21 DIAGNOSIS — M25561 Pain in right knee: Secondary | ICD-10-CM | POA: Insufficient documentation

## 2017-08-21 DIAGNOSIS — Y998 Other external cause status: Secondary | ICD-10-CM | POA: Diagnosis not present

## 2017-08-21 DIAGNOSIS — I130 Hypertensive heart and chronic kidney disease with heart failure and stage 1 through stage 4 chronic kidney disease, or unspecified chronic kidney disease: Secondary | ICD-10-CM | POA: Diagnosis not present

## 2017-08-21 DIAGNOSIS — I2510 Atherosclerotic heart disease of native coronary artery without angina pectoris: Secondary | ICD-10-CM | POA: Diagnosis not present

## 2017-08-21 DIAGNOSIS — W010XXA Fall on same level from slipping, tripping and stumbling without subsequent striking against object, initial encounter: Secondary | ICD-10-CM | POA: Insufficient documentation

## 2017-08-21 DIAGNOSIS — Y92009 Unspecified place in unspecified non-institutional (private) residence as the place of occurrence of the external cause: Secondary | ICD-10-CM | POA: Insufficient documentation

## 2017-08-21 MED ORDER — LIDOCAINE 5 % EX PTCH
1.0000 | MEDICATED_PATCH | CUTANEOUS | Status: DC
  Administered 2017-08-21: 1 via TRANSDERMAL
  Filled 2017-08-21: qty 1

## 2017-08-21 MED ORDER — LIDOCAINE 5 % EX PTCH: 1.0000 | MEDICATED_PATCH | CUTANEOUS | 0 refills | Status: DC

## 2017-08-21 NOTE — ED Notes (Addendum)
Pt reports a fall on the 13th of this month with some soreness but states that today was the first day that she had pain. Pt also reports she has cane and walker at home but does not use either.

## 2017-08-21 NOTE — ED Notes (Signed)
PT advised that home health will be coming out to her home to meet with her. Pt agrees.

## 2017-08-21 NOTE — ED Provider Notes (Signed)
[facility] DEPT Provider Note   CSN: [id number] Arrival date & time: 08/21/17  0350     History   Chief Complaint Chief Complaint  Patient presents with  . Fall    HPI [name] is a 83 y.o. female.   The history is provided by the patient.  Fall  This is a new problem. The current episode started more than 1 week ago. The problem occurs constantly. The problem has not changed since onset.Pertinent negatives include no chest pain, no abdominal pain, no headaches and no shortness of breath. The symptoms are aggravated by walking. Nothing relieves the symptoms. She has tried nothing for the symptoms. The treatment provided no relief.    Past Medical History:  Diagnosis Date  . Anxiety    "when I come to the hospital; don't take any RX" (07/29/2013)  . Arthritis    "left wrist; back" (07/29/2013)  . Automatic implantable cardioverter-defibrillator in situ    placed in 2006; removed on 07/29/2013 (07/29/2013)  . Cancer [facility]) Sept. 2015   Left Hand  -  Skin Cancer  . Carotid stenosis    occluded right intracranial ICA followed by Dr. Donnetta Hutching.  1-39% bilateral ICA stenosis  . Cerebrovascular disease    extracranial  . Chronic combined systolic and diastolic CHF (congestive heart failure) ([place])   . Chronic kidney disease, unspecified   . Complete heart block ([place]) 07/29/2013   Medtronic Adapta L DR implanted with new RA and RV leads inserted  . Coronary artery disease    s/p CABG  . Fractured shoulder 2009   Right shoulder Fx. from a fall  . Heart murmur 05/14/2016  . Hyperlipidemia   . Hypertension   . Ischemic cardiomyopathy    s/p ICD (MDT) by Dr Leonia Reeves 2006, EF 24% - EF improved to 55% in 2014 and ICD downgraded to dual chamber PPM.  EF declined to 20-25% by echo 2017 and decision made by EP to not upgrade device to ICD due to advanced age  . Osteoporosis   . Overactive bladder   . Postoperative atrial fibrillation ([place])   . Pulmonary HTN ([place])    severe by echo  04/2016 - likely Group 2 from pulmonary venous HTN secondary to LV dysfunction and MR  . Severe mitral regurgitation    s/p MV ring.  Moderate MR and mild mitral stenosis on echo 04/2016  . Stroke [facility]) 07/2005   "when they took me off heart/lung machine; affected my left foot" (07/29/2013)  . Type II diabetes mellitus [facility])     Patient Active Problem List   Diagnosis Date Noted  . Heart murmur 05/14/2016  . Fall 11/17/2015  . Scalp laceration 11/17/2015  . Facial contusion 11/17/2015  . Rib fracture 11/16/2015  . Paresthesia of both hands 01/18/2014  . Renal insufficiency 01/18/2014  . Lower extremity weakness 01/18/2014  . Coronary artery disease   . Type II diabetes mellitus ([place])   . Hyperlipidemia   . Carotid stenosis   . Complete heart block ([place])   . Severe mitral regurgitation   . Chronic combined systolic and diastolic CHF (congestive heart failure) ([place])   . Postoperative atrial fibrillation ([place]) 10/29/2013  . 6949 lead 07/21/2013  . Chronic kidney disease, unspecified   . Hypertension 04/30/2011    Past Surgical History:  Procedure Laterality Date  . ABDOMINAL HYSTERECTOMY  ? 1974  . APPENDECTOMY  1949  . CARDIAC CATHETERIZATION  07/2005  . CARDIAC DEFIBRILLATOR PLACEMENT  11/2005   by  Dr Leonia Reeves (MDT) she has a [phone] fidelis lead  . CARDIAC VALVE REPLACEMENT  07/2005   "mitral valve" (07/29/2013)  . CATARACT EXTRACTION W/ INTRAOCULAR LENS  IMPLANT, BILATERAL Bilateral 1980's  . CORONARY ARTERY BYPASS GRAFT  07/2005   "CABG X3" (07/29/2013)  . JOINT REPLACEMENT Right 2006   Hip  . MITRAL VALVE REPLACEMENT  07/2005  . PACEMAKER PLACEMENT  07/29/2013   upgrade of single chamber ICD to Medtronic Adapta L DR implanted with new RA and RV leads inserted  . PERMANENT PACEMAKER INSERTION N/A 07/29/2013   Procedure: PERMANENT PACEMAKER INSERTION;  Surgeon: Thompson Grayer, MD;  Location[facility] C[facility] LAB;  Service: Cardiovascular;  Laterality: N/A;  . SALPINGOOPHORECTOMY Bilateral  1976  . SHOULDER ARTHROSCOPY W/ ROTATOR CUFF REPAIR Right 1998  . TOTAL HIP ARTHROPLASTY Right 2001    OB History    No data available       Home Medications    Prior to Admission medications   Medication Sig Start Date End Date Taking? Authorizing Provider  acetaminophen (TYLENOL) 500 MG tablet Take 500 mg by mouth daily as needed (pain).     [provider]  aspirin EC 81 MG tablet Take 81 mg by mouth every morning.    [provider]  carvedilol (COREG) 12.5 MG tablet Take 12.5 mg by mouth 2 (two) times daily with a meal.     [provider]  carvedilol (COREG) 12.5 MG tablet TAKE 1 TABLET (12.5 MG TOTAL) BY MOUTH 2 (TWO) TIMES DAILY. 06/18/17   Sueanne Margarita, MD  Cholecalciferol (VITAMIN D) 2000 UNITS tablet Take 2,000 Units by mouth daily.     [provider]  fish oil-omega-3 fatty acids 1000 MG capsule Take 1 g by mouth 2 (two) times daily.     [provider]  glipiZIDE (GLUCOTROL XL) 10 MG 24 hr tablet Take 20 mg by mouth daily.    [provider]  Multiple Vitamins-Minerals (CENTRUM SILVER PO) Take 1 tablet by mouth daily.     [provider]  oxybutynin (DITROPAN) 5 MG tablet Take 1 tablet by mouth 2 (two) times daily. 05/02/15   [provider]  sacubitril-valsartan (ENTRESTO) 49-51 MG Take 1 tablet by mouth 2 (two) times daily. 05/30/17   Larey Dresser, MD  simvastatin (ZOCOR) 40 MG tablet TAKE 1 TABLET BY MOUTH AT BEDTIME 02/25/17   Sueanne Margarita, MD  spironolactone (ALDACTONE) 25 MG tablet TAKE 1 TABLET (25 MG TOTAL) BY MOUTH DAILY. 06/12/17   Sueanne Margarita, MD  vitamin B-12 (CYANOCOBALAMIN) 1000 MCG tablet Take 1,000 mcg by mouth daily.    [provider]    Family History Family History  Problem Relation Age of Onset  . Cancer Mother        Theadora Rama   . Heart disease Mother        Before age 14  . Heart disease Sister        Before age 52-  CABG  . Cancer Sister        Lonia Chimera    . Diabetes Sister   . Hyperlipidemia Sister   . Heart attack Sister   . Diabetes Daughter   . Coronary artery disease Unknown        family hx of  . Hypertension Unknown        family hx of  . Diabetes Unknown        family hx of    Social History Social History  Substance Use Topics  . Smoking status: Never Smoker  . Smokeless tobacco: Never Used  . Alcohol use No     Allergies   Fosamax [alendronate]; Horse-derived products; Alendronate sodium; Clonidine derivatives; Darvocet [propoxyphene n-acetaminophen]; Lipitor [atorvastatin]; Sulfa drugs cross reactors; Tramadol; Vicodin [hydrocodone-acetaminophen]; Penicillins; and Septra [sulfamethoxazole-trimethoprim]   Review of Systems Review of Systems  Constitutional: Negative for chills, diaphoresis, fatigue and fever.  HENT: Negative for congestion and rhinorrhea.   Eyes: Negative for visual disturbance.  Respiratory: Negative for cough, chest tightness, shortness of breath, wheezing and stridor.   Cardiovascular: Negative for chest pain, palpitations and leg swelling.  Gastrointestinal: Negative for abdominal pain, constipation, diarrhea, nausea and vomiting.  Genitourinary: Negative for dysuria, flank pain and frequency.  Musculoskeletal: Negative for back pain, neck pain and neck stiffness.  Skin: Negative for rash and wound.  Neurological: Negative for weakness, light-headedness, numbness and headaches.  Psychiatric/Behavioral: Negative for agitation.  All other systems reviewed and are negative.    Physical Exam Updated Vital Signs BP 135/75 (BP Location: Left Arm)   Pulse 93   Temp 99.5 F (37.5 C)   Resp 16   SpO2 99%   Physical Exam  Constitutional: She is oriented to person, place, and time. She appears well-developed and well-nourished. No distress.  HENT:  Head: Normocephalic and atraumatic.  Mouth/Throat: Oropharynx is clear and moist. No oropharyngeal exudate.  Eyes: Pupils are equal, round, and  reactive to light. Conjunctivae and EOM are normal.  Neck: Normal range of motion.  Cardiovascular: Normal rate and intact distal pulses.   Murmur heard. Pulmonary/Chest: Effort normal and breath sounds normal. No stridor. She has no wheezes. She has no rales. She exhibits no tenderness.  Abdominal: Soft. Bowel sounds are normal. There is no tenderness.  Musculoskeletal: She exhibits tenderness. She exhibits no edema or deformity.       Right shoulder: She exhibits tenderness and pain. She exhibits no crepitus and no deformity.       Right knee: She exhibits ecchymosis. She exhibits no swelling, no effusion, no deformity and no laceration. Tenderness found. Medial joint line tenderness noted.       Lumbar back: She exhibits tenderness and pain.       Back:       Arms:      Legs: Neurological: She is alert and oriented to person, place, and time. No cranial nerve deficit or sensory deficit. She exhibits normal muscle tone.  Skin: Skin is warm. Capillary refill takes less than 2 seconds. No rash noted. She is not diaphoretic. No erythema.  Psychiatric: She has a normal mood and affect.  Nursing note and vitals reviewed.    ED Treatments / Results  Labs (all labs ordered are listed, but only abnormal results are displayed) Labs Reviewed - No data to display  EKG  EKG Interpretation None       Radiology Dg Lumbar Spine Complete  Result Date: 08/21/2017 CLINICAL DATA:  Status post fall 10 days ago. History of arthritis of the back. EXAM: LUMBAR SPINE - COMPLETE 4+ VIEW COMPARISON:  Limited views of the lumbar spine from a pelvis series of April 30, 2007. FINDINGS: The bones are subjectively osteopenic. There is moderate curvature convex toward the right centered at L4. There is are rotatory component. There are large lateral osteophytes on the right at L1-2 and on the left at L3-4. The vertebral bodies are preserved in height. There is no spondylolisthesis. There is multilevel  degenerative disc disease. There is facet  joint hypertrophy at L5-S1. The observed portions of the sacrum are normal. There is dense calcification in the wall of the abdominal aorta and common iliac vessels. IMPRESSION: Multilevel degenerative disc disease. Moderate dextrocurvature centered at L4 which has a rotatory component. No acute compression fracture. Abdominal aortic atherosclerosis. Electronically Signed   By: David  Martinique M.D.   On: 08/21/2017 09:53   Dg Shoulder Right  Result Date: 08/21/2017 CLINICAL DATA:  Status post fall 10 days ago with persistent right shoulder pain. History of previous fracture of the acromion. EXAM: RIGHT SHOULDER - 2+ VIEW COMPARISON:  CT scan of the right shoulder dated February 25, 2009 and right shoulder radiographic series of November 13, 2008. FINDINGS: There is chronic widening of the [facility] joint likely reflecting the previous acromial fracture. There is marked narrowing of the subacromial subdeltoid space. There is marked narrowing of the glenohumeral joint space. The humeral head and neck appear intact. No definite acute scapular fracture is observed. The observed portions of the right clavicle and upper right ribs exhibit no acute abnormalities. IMPRESSION: No acute fracture or dislocation of the right shoulder is observed. There is moderate to severe degenerative change of the glenohumeral and AC joints with evidence of a known previous acromial fracture and subsequent widening of the [facility] joint. If there remain strong clinical concern of an occult acute fracture, repeat shoulder CT scanning would be useful. Electronically Signed   By: David  Martinique M.D.   On: 08/21/2017 09:56   Dg Knee Complete 4 Views Right  Result Date: 08/21/2017 CLINICAL DATA:  Recent fall, bruising to anterior knee EXAM: RIGHT KNEE - COMPLETE 4+ VIEW COMPARISON:  None. FINDINGS: No acute bony abnormality. Specifically, no fracture, subluxation, or dislocation. Soft tissues are intact. No joint  effusion. Vascular calcifications noted. IMPRESSION: No acute bony abnormality. Electronically Signed   By: Rolm Baptise M.D.   On: 08/21/2017 11:01   Dg Hip Unilat W Or Wo Pelvis 2-3 Views Right  Result Date: 08/21/2017 CLINICAL DATA:  Status post fall 10 days ago with persistent right hip pain. EXAM: DG HIP (WITH OR WITHOUT PELVIS) 2-3V RIGHT COMPARISON:  AP pelvis radiograph dated April 30, 2007. FINDINGS: The bones are subjectively osteopenic. No acute or healing pelvic fracture is observed. There degenerative changes of the lumbar spine where visualized. There is a prosthetic right hip joint. The appearance of the prosthetic components appears stable. The interface with the native bone appears normal. There is calcification within the iliac arteries and femoral arteries. The soft tissues exhibit no acute abnormalities. IMPRESSION: No acute abnormality of the prosthetic right hip joint nor of the native bone. There is diffuse osteopenia. There are degenerative changes of the lumbar spine. Electronically Signed   By: David  Martinique M.D.   On: 08/21/2017 09:51    Procedures Procedures (including critical care time)  Medications Ordered in ED Medications  lidocaine (LIDODERM) 5 % 1 patch (1 patch Transdermal Patch Applied 08/21/17 1229)     Initial Impression / Assessment and Plan / ED Course  I have reviewed the triage vital signs and the nursing notes.  Pertinent labs & imaging results that were available during my care of the patient were reviewed by me and considered in my medical decision making (see chart for details).     [name] is a 83 y.o. female With a past medical history significant for hypertension, CAD, diabetes, pacemaker, and hyperlipidemia who presents with fall 10 days ago and subsequent right shoulder, right low  back, and right knee pain. Patient reports that she had a mechanical fall while walking to answer her door 10 days ago. She says that she tripped on a  low-lying rug. She denies hitting her head but reports catching herself with her shoulder and her knee. She says that she had to have assistance getting backup but since then has had continued mild to moderate pain in the right shoulder, right low back, and right knee. Patient denies hitting her head or loss of consciousness. She denies any chest pain or abdominal pain. She denies any vision changes, headaches, nausea, or vomiting. She says that she is occasionally incontinent of urine this has not changed.  She denies any medication changes in says that she has taken some mild Tylenol to help with her symptoms. She is still able to emulate with a walker and has no other complaints on arrival.  On exam, patient has tenderness in the right shoulder. Patient says that she broke her right shoulder in the past and has decreased range of motion. She denies any change in range of motion from before the fall. She has a mild tenderness in her right low back but no midline tenderness. No bruising or laceration seen. No CVA tenderness. No abdominal tenderness. Lungs clear. Chest nontender. Patient also tenderness on the right knee. No swelling seen. Mild bruise present. Normal sensation, strength, and pulses in lower extremities. Negative straight leg raise test.  Patient had x-rays of the knee, hip, lumbar spine, and shoulder. All imaging was negative for new injury.  Patient reassured of her workup. Doubt occult injury at this time. Patient has no other symptoms concerning for spinal injury. Given patient's living alone and her false, the care management team was called for assistance. They agreed that patient needs a home health evaluation to determine living environment recommendations.  Patient had a Lidoderm patch place to help with her pain. Patient reported improvement in pain.  Patient given prescription for Lidoderm patches and she will continue her outpatient pain regimen. Patient understands plan to  follow up with PCP as well as strict return precautions for any new or worsened symptoms. Given patient's greater than one week since her fall, suspect any more severe intracranial or other injuries would have declared himself at this time. Do not feel patient needs further workup or imaging at this time. Patient continues to understand return precautions and was discharged in good condition with improving pain.  Final Clinical Impressions(s) / ED Diagnoses   Final diagnoses:  Fall, initial encounter  Acute pain of right shoulder  Acute pain of right knee    New Prescriptions Discharge Medication List as of 08/21/2017  2:42 PM    START taking these medications   Details  lidocaine (LIDODERM) 5 % Place 1 patch onto the skin daily. Remove & Discard patch within 12 hours or as directed by MD, Starting Wed 08/21/2017, Print       Clinical Impression: 1. Fall, initial encounter   2. Acute pain of right shoulder   3. Acute pain of right knee     Disposition: Discharge  Condition: Good  I have discussed the results, Dx and Tx plan with the pt(& family if present). He/she/they expressed understanding and agree(s) with the plan. Discharge instructions discussed at great length. Strict return precautions discussed and pt &/or family have verbalized understanding of the instructions. No further questions at time of discharge.    Discharge Medication List as of 08/21/2017  2:42 PM  START taking these medications   Details  lidocaine (LIDODERM) 5 % Place 1 patch onto the skin daily. Remove & Discard patch within 12 hours or as directed by MD, Starting Wed 08/21/2017, Print        Follow Up: Home, [facility] [place] [address] [phone]  Follow up Arville Go.Marland KitchenRN/NA/PT/OT/SW  Mayra Neer, MD [address] [place] [address] [phone]     [place] [address] [id number] [place] [phone]  If symptoms worsen     Amena Dockham, Gwenyth Allegra, MD 08/21/17 2107

## 2017-08-21 NOTE — ED Notes (Signed)
Pt attempting to call friend for ride home later today.

## 2017-08-21 NOTE — ED Notes (Signed)
Patient transported to X-ray 

## 2017-08-21 NOTE — Discharge Planning (Signed)
Arvon Schreiner J. Clydene Laming, RN, BSN, [employer] [phone] Spoke with pt at bedside regarding discharge planning for [facility]. Offered pt list of home health agencies to choose from.  Pt chose [facility] to render services. Christa See, RN of [facility]  notified. Patient made aware that [facility]  will be in contact in 24-48 hours.  No DME needs identified at this time.

## 2017-08-21 NOTE — Discharge Instructions (Signed)
Your workup and pictures today did not show evidence of fractures or dislocation. Likely has soft tissue injuries to her shoulder and your knee. Please follow-up with your primary care physician for further management. The home health team orders were placed for assistance.  If you have any new or worsening symptoms, please return to the nearest emergency department.

## 2017-08-21 NOTE — ED Triage Notes (Signed)
Pt arrives to ED from home with [facility], pt lives alone and states over 1 week she had a fall not using her walker and fell onto right shoulder and is having shoulder and lower back pain.

## 2017-08-21 NOTE — Progress Notes (Signed)
CW spoke with pt at bedside. During this time CSW informed pt that [facility] had been set up for pt by RN CM and that CSW had called an placed pt on the list for Mobile Meals. CSW also informed pt that the social worker for pt's [facility] would be bringing pt groceries per RN CM report. No further CSW needs at this time.    Virgie Dad Laiya Wisby, MSW, [facility] Clinical Social Worker [phone]

## 2017-09-09 ENCOUNTER — Ambulatory Visit (INDEPENDENT_AMBULATORY_CARE_PROVIDER_SITE_OTHER): Payer: Medicare Other | Admitting: *Deleted

## 2017-09-09 DIAGNOSIS — I442 Atrioventricular block, complete: Secondary | ICD-10-CM | POA: Diagnosis not present

## 2017-09-09 NOTE — Progress Notes (Signed)
Remote pacemaker transmission.   

## 2017-09-10 LAB — CUP PACEART REMOTE DEVICE CHECK
Battery Impedance: 207 Ohm
Battery Voltage: 2.78 V
Brady Statistic AP VS Percent: 69 %
Brady Statistic AS VP Percent: 0 %
Date Time Interrogation Session: 20180910132220
Implantable Lead Implant Date: 20140730
Implantable Lead Implant Date: 20140730
Implantable Lead Location: 753859
Implantable Lead Model: 5076
Implantable Lead Model: 5076
Lead Channel Impedance Value: 499 Ohm
Lead Channel Pacing Threshold Amplitude: 0.625 V
Lead Channel Pacing Threshold Pulse Width: 0.4 ms
Lead Channel Setting Pacing Amplitude: 2.5 V
Lead Channel Setting Pacing Pulse Width: 0.4 ms
Lead Channel Setting Sensing Sensitivity: 2.8 mV
MDC IDC LEAD LOCATION: 753860
MDC IDC MSMT BATTERY REMAINING LONGEVITY: 113 mo
MDC IDC MSMT LEADCHNL RA IMPEDANCE VALUE: 435 Ohm
MDC IDC MSMT LEADCHNL RV PACING THRESHOLD AMPLITUDE: 0.75 V
MDC IDC MSMT LEADCHNL RV PACING THRESHOLD PULSEWIDTH: 0.4 ms
MDC IDC PG IMPLANT DT: 20140730
MDC IDC SET LEADCHNL RA PACING AMPLITUDE: 2 V
MDC IDC STAT BRADY AP VP PERCENT: 30 %
MDC IDC STAT BRADY AS VS PERCENT: 1 %

## 2017-09-11 ENCOUNTER — Encounter: Payer: Self-pay | Admitting: Cardiology

## 2017-09-11 NOTE — Progress Notes (Signed)
Electrophysiology Office Note Date: 09/12/2017  ID:  [name], DOB [birth year], MRN [id number]  PCP: Mayra Neer, MD Primary Cardiologist: Radford Pax Electrophysiologist: Allred  CC: Pacemaker follow-up  [name] is a 83 y.o. female seen today for Dr Rayann Heman.  She presents today for routine electrophysiology followup.  Since last being seen in our clinic, the patient reports doing relatively well.  Her primary complaint today is that her legs are "wobbly". This has been her main concern for the last several months.  She denies chest pain, palpitations, dyspnea, PND, orthopnea, nausea, vomiting, dizziness, syncope, edema, weight gain, or early satiety.  Device History: MDT single chamber ICD implanted 2006 for NICM; downgrade to dual chamber MDT PPM implanted 2014 for transient complete heart block    Past Medical History:  Diagnosis Date  . Arthritis    "left wrist; back" (07/29/2013)  . Cancer [facility]) Sept. 2015   Left Hand  -  Skin Cancer  . Carotid stenosis    occluded right intracranial ICA followed by Dr. Donnetta Hutching.  1-39% bilateral ICA stenosis  . Chronic combined systolic and diastolic CHF (congestive heart failure) ([place])   . Chronic kidney disease, unspecified   . Complete heart block ([place]) 07/29/2013   a. s/p MDT dual chamber PPM  . Coronary artery disease    s/p CABG  . Hyperlipidemia   . Hypertension   . Ischemic cardiomyopathy    a. s/p MDT single chamber ICD 2006 b. EF improved and device was downgraded to dual chamber PPM 2014  . Osteoporosis   . Overactive bladder   . Postoperative atrial fibrillation ([place])   . Pulmonary HTN ([place])    severe by echo 04/2016 - likely Group 2 from pulmonary venous HTN secondary to LV dysfunction and MR  . Severe mitral regurgitation    s/p MV ring.  Moderate MR and mild mitral stenosis on echo 04/2016  . Stroke ([place]) 07/2005  . Type II diabetes mellitus ([place])    Past Surgical History:  Procedure Laterality Date  .  APPENDECTOMY  1949  . CARDIAC DEFIBRILLATOR PLACEMENT  11/2005   by Dr Leonia Reeves (MDT) she has a [id number] fidelis lead  . CATARACT EXTRACTION W/ INTRAOCULAR LENS  IMPLANT, BILATERAL Bilateral 1980's  . CORONARY ARTERY BYPASS GRAFT  07/2005   "CABG X3" (07/29/2013)  . MITRAL VALVE REPLACEMENT  07/2005  . PERMANENT PACEMAKER INSERTION N/A 07/29/2013   upgrade of single chamber ICD to Medtronic Adapta L DR implanted with new RA and RV leads inserted  . SALPINGOOPHORECTOMY Bilateral 1976  . SHOULDER ARTHROSCOPY W/ ROTATOR CUFF REPAIR Right 1998  . TOTAL HIP ARTHROPLASTY Right 2001    Current Outpatient Prescriptions  Medication Sig Dispense Refill  . acetaminophen (TYLENOL) 500 MG tablet Take 500 mg by mouth daily as needed (pain).     Marland Kitchen aspirin EC 81 MG tablet Take 81 mg by mouth every morning.    . carvedilol (COREG) 12.5 MG tablet TAKE 1 TABLET (12.5 MG TOTAL) BY MOUTH 2 (TWO) TIMES DAILY. 60 tablet 11  . Cholecalciferol (VITAMIN D) 2000 UNITS tablet Take 2,000 Units by mouth daily.     . fish oil-omega-3 fatty acids 1000 MG capsule Take 1 g by mouth 2 (two) times daily.     Marland Kitchen glipiZIDE (GLUCOTROL XL) 10 MG 24 hr tablet Take 20 mg by mouth daily.    Marland Kitchen lidocaine (LIDODERM) 5 % Place 1 patch onto the skin daily. Remove & Discard patch within 12 hours  or as directed by MD 14 patch 0  . Multiple Vitamins-Minerals (CENTRUM SILVER PO) Take 1 tablet by mouth daily.     Marland Kitchen oxybutynin (DITROPAN) 5 MG tablet Take 1 tablet by mouth 2 (two) times daily.  3  . sacubitril-valsartan (ENTRESTO) 49-51 MG Take 1 tablet by mouth 2 (two) times daily. 60 tablet 6  . simvastatin (ZOCOR) 40 MG tablet TAKE 1 TABLET BY MOUTH AT BEDTIME 30 tablet 8  . spironolactone (ALDACTONE) 25 MG tablet TAKE 1 TABLET (25 MG TOTAL) BY MOUTH DAILY. 90 tablet 3  . vitamin B-12 (CYANOCOBALAMIN) 1000 MCG tablet Take 1,000 mcg by mouth daily.     No current facility-administered medications for this visit.     Allergies:   Fosamax  [alendronate]; Horse-derived products; Alendronate sodium; Clonidine derivatives; Darvocet [propoxyphene n-acetaminophen]; Lipitor [atorvastatin]; Sulfa drugs cross reactors; Tramadol; Vicodin [hydrocodone-acetaminophen]; Penicillins; and Septra [sulfamethoxazole-trimethoprim]   Social History: Social History   Social History  . Marital status: Widowed    Spouse name: N/A  . Number of children: N/A  . Years of education: N/A   Occupational History  . retired Marine scientist    Social History Main Topics  . Smoking status: Never Smoker  . Smokeless tobacco: Never Used  . Alcohol use No  . Drug use: No  . Sexual activity: No   Other Topics Concern  . Not on file   Social History Narrative  . No narrative on file    Family History: Family History  Problem Relation Age of Onset  . Cancer Mother        Theadora Rama   . Heart disease Mother        Before age 16  . Heart disease Sister        Before age 96-  CABG  . Cancer Sister        Lonia Chimera  . Diabetes Sister   . Hyperlipidemia Sister   . Heart attack Sister   . Diabetes Daughter   . Coronary artery disease Unknown        family hx of  . Hypertension Unknown        family hx of  . Diabetes Unknown        family hx of     Review of Systems: All other systems reviewed and are otherwise negative except as noted above.   Physical Exam: VS:  BP (!) 153/81   Pulse 80   Ht 5' (1.524 m)   Wt 134 lb (60.8 kg)   SpO2 94%   BMI 26.17 kg/m  , BMI Body mass index is 26.17 kg/m.  GEN- The patient is elderly and frail appearing, alert and oriented x 3 today.   HEENT: normocephalic, atraumatic; sclera clear, conjunctiva pink; hearing intact; oropharynx clear; neck supple  Lungs- Clear to ausculation bilaterally, normal work of breathing.  No wheezes, rales, rhonchi Heart- Regular rate and rhythm (paced) GI- soft, non-tender, non-distended, bowel sounds present  Extremities- no clubbing, cyanosis, or edema  MS- no significant  deformity or atrophy Skin- warm and dry, no rash or lesion; PPM pocket well healed Psych- euthymic mood, full affect Neuro- strength and sensation are intact  PPM Interrogation- reviewed in detail today,  See PACEART report  EKG:  EKG is not ordered today.  Recent Labs: 05/16/2017: ALT 14 06/06/2017: BUN 20; Creatinine, Ser 0.95; Potassium 5.0; Sodium 141   Wt Readings from Last 3 Encounters:  09/12/17 134 lb (60.8 kg)  08/21/17 130 lb (59 kg)  05/30/17  133 lb (60.3 kg)     Other studies Reviewed: Additional studies/ records that were reviewed today include: Dr Rayann Heman and Dr Theodosia Blender office notes  Assessment and Plan:  1.  Transient complete heart block Normal PPM function - pt is pacemaker dependent today See Pace Art report No changes today Previously considered for upgrade to CRTP - felt to be high risk with advanced age and fragility. Pt opted for conservative approach  2.  HTN Stable No change required today  3.  Chronic systolic heart failure Euvolemic on exam Continue current therapy  4.  Paroxysmal atrial fibrillation Burden by device interrogation 0% Avoid [place] with falls and advanced age, low AF burden    Current medicines are reviewed at length with the patient today.   The patient does not have concerns regarding her medicines.  The following changes were made today:  none  Labs/ tests ordered today include: none No orders of the defined types were placed in this encounter.    Disposition:   Follow up with [facility], Dr Rayann Heman 1 year, Dr Chauncey Mann as scheduled     Signed, Chanetta Marshall, NP 09/12/2017 9:21 AM  [place] [address] [phone] (office) [phone] (fax)

## 2017-09-12 ENCOUNTER — Telehealth (HOSPITAL_COMMUNITY): Payer: Self-pay | Admitting: Pharmacist

## 2017-09-12 ENCOUNTER — Encounter: Payer: Self-pay | Admitting: Nurse Practitioner

## 2017-09-12 ENCOUNTER — Ambulatory Visit (INDEPENDENT_AMBULATORY_CARE_PROVIDER_SITE_OTHER): Payer: Medicare Other | Admitting: Nurse Practitioner

## 2017-09-12 VITALS — BP 153/81 | HR 80 | Ht 60.0 in | Wt 134.0 lb

## 2017-09-12 DIAGNOSIS — I480 Paroxysmal atrial fibrillation: Secondary | ICD-10-CM | POA: Diagnosis not present

## 2017-09-12 DIAGNOSIS — I10 Essential (primary) hypertension: Secondary | ICD-10-CM | POA: Diagnosis not present

## 2017-09-12 DIAGNOSIS — I442 Atrioventricular block, complete: Secondary | ICD-10-CM | POA: Diagnosis not present

## 2017-09-12 DIAGNOSIS — I5022 Chronic systolic (congestive) heart failure: Secondary | ICD-10-CM

## 2017-09-12 NOTE — Telephone Encounter (Signed)
Renewed PANF enrollment for [name] so that she will have another $800 to use toward her Entresto copays through 08/29/17. Attempted to call and inform her of her renewal since she seemed confused about the grant amount that she had left (she thought the grant amount was what she owed for her Entrest) but she didn't answer and didn't have a VM set up.   Member ID: [id number] Group ID: [id number] RxBin ID: [id number] PCN: [id number] Eligibility Start Date: 08/29/2017 Eligibility End Date: 08/28/2018 Assistance Amount: $800.00   Doroteo Bradford K. Velva Harman, PharmD, BCPS, CPP Clinical Pharmacist Pager: [phone] Phone: [phone] 09/12/2017 2:45 PM

## 2017-09-12 NOTE — Patient Instructions (Addendum)
Medication Instructions:   Your physician recommends that you continue on your current medications as directed. Please refer to the Current Medication list given to you today.   If you need a refill on your cardiac medications before your next appointment, please call your pharmacy.  Labwork: NONE ORDERED  TODAY    Testing/Procedures: NONE ORDERED  TODAY    Follow-Up:  Your physician wants you to follow-up in: [place] will receive a reminder letter in the mail two months in advance. If you don't receive a letter, please call our office to schedule the follow-up appointment.  Remote monitoring is used to monitor your Pacemaker of ICD from home. This monitoring reduces the number of office visits required to check your device to one time per year. It allows [place] to keep an eye on the functioning of your device to ensure it is working properly. You are scheduled for a device check from home on .  10-14-17 You may send your transmission at any time that day. If you have a wireless device, the transmission will be sent automatically. After your physician reviews your transmission, you will receive a postcard with your next transmission date.      Any Other Special Instructions Will Be Listed Below (If Applicable).

## 2017-09-17 NOTE — Telephone Encounter (Signed)
Patient called back and she is aware of the PAN renewal and no further questions.

## 2017-09-18 ENCOUNTER — Telehealth: Payer: Self-pay | Admitting: Internal Medicine

## 2017-09-18 NOTE — Telephone Encounter (Signed)
New message    Pt is calling to ask question about home transmission. Please call.

## 2017-09-18 NOTE — Telephone Encounter (Signed)
Clarified that her next pacemaker remote transmission is scheduled 12/09/17.

## 2017-10-07 LAB — CUP PACEART INCLINIC DEVICE CHECK
Date Time Interrogation Session: 20181008075230
Implantable Lead Location: 753860
Implantable Lead Model: 5076
Implantable Lead Model: 5076
MDC IDC LEAD IMPLANT DT: 20140730
MDC IDC LEAD IMPLANT DT: 20140730
MDC IDC LEAD LOCATION: 753859
MDC IDC PG IMPLANT DT: 20140730

## 2017-10-18 ENCOUNTER — Encounter (HOSPITAL_BASED_OUTPATIENT_CLINIC_OR_DEPARTMENT_OTHER): Payer: Self-pay

## 2017-10-18 ENCOUNTER — Emergency Department (HOSPITAL_BASED_OUTPATIENT_CLINIC_OR_DEPARTMENT_OTHER)
Admission: EM | Admit: 2017-10-18 | Discharge: 2017-10-18 | Disposition: A | Discharge status: Home / Self Care | Payer: Medicare Other | Attending: Emergency Medicine | Admitting: Emergency Medicine

## 2017-10-18 ENCOUNTER — Emergency Department (HOSPITAL_BASED_OUTPATIENT_CLINIC_OR_DEPARTMENT_OTHER): Payer: Medicare Other

## 2017-10-18 DIAGNOSIS — Y999 Unspecified external cause status: Secondary | ICD-10-CM | POA: Insufficient documentation

## 2017-10-18 DIAGNOSIS — Y92481 Parking lot as the place of occurrence of the external cause: Secondary | ICD-10-CM | POA: Insufficient documentation

## 2017-10-18 DIAGNOSIS — Z8673 Personal history of transient ischemic attack (TIA), and cerebral infarction without residual deficits: Secondary | ICD-10-CM | POA: Diagnosis not present

## 2017-10-18 DIAGNOSIS — Z85828 Personal history of other malignant neoplasm of skin: Secondary | ICD-10-CM | POA: Insufficient documentation

## 2017-10-18 DIAGNOSIS — I5042 Chronic combined systolic (congestive) and diastolic (congestive) heart failure: Secondary | ICD-10-CM | POA: Insufficient documentation

## 2017-10-18 DIAGNOSIS — I130 Hypertensive heart and chronic kidney disease with heart failure and stage 1 through stage 4 chronic kidney disease, or unspecified chronic kidney disease: Secondary | ICD-10-CM | POA: Diagnosis not present

## 2017-10-18 DIAGNOSIS — N189 Chronic kidney disease, unspecified: Secondary | ICD-10-CM | POA: Diagnosis not present

## 2017-10-18 DIAGNOSIS — Z7982 Long term (current) use of aspirin: Secondary | ICD-10-CM | POA: Insufficient documentation

## 2017-10-18 DIAGNOSIS — Z951 Presence of aortocoronary bypass graft: Secondary | ICD-10-CM | POA: Diagnosis not present

## 2017-10-18 DIAGNOSIS — E1122 Type 2 diabetes mellitus with diabetic chronic kidney disease: Secondary | ICD-10-CM | POA: Diagnosis not present

## 2017-10-18 DIAGNOSIS — I2510 Atherosclerotic heart disease of native coronary artery without angina pectoris: Secondary | ICD-10-CM | POA: Diagnosis not present

## 2017-10-18 DIAGNOSIS — Z7984 Long term (current) use of oral hypoglycemic drugs: Secondary | ICD-10-CM | POA: Diagnosis not present

## 2017-10-18 DIAGNOSIS — Z79899 Other long term (current) drug therapy: Secondary | ICD-10-CM | POA: Insufficient documentation

## 2017-10-18 DIAGNOSIS — Z96641 Presence of right artificial hip joint: Secondary | ICD-10-CM | POA: Insufficient documentation

## 2017-10-18 DIAGNOSIS — Y939 Activity, unspecified: Secondary | ICD-10-CM | POA: Insufficient documentation

## 2017-10-18 DIAGNOSIS — W010XXA Fall on same level from slipping, tripping and stumbling without subsequent striking against object, initial encounter: Secondary | ICD-10-CM | POA: Insufficient documentation

## 2017-10-18 DIAGNOSIS — S098XXA Other specified injuries of head, initial encounter: Secondary | ICD-10-CM | POA: Diagnosis present

## 2017-10-18 DIAGNOSIS — S0003XA Contusion of scalp, initial encounter: Secondary | ICD-10-CM | POA: Insufficient documentation

## 2017-10-18 DIAGNOSIS — Z950 Presence of cardiac pacemaker: Secondary | ICD-10-CM | POA: Insufficient documentation

## 2017-10-18 MED ORDER — ACETAMINOPHEN 500 MG PO TABS
1000.0000 mg | ORAL_TABLET | Freq: Once | ORAL | Status: AC
Start: 2017-10-18 — End: 2017-10-18
  Administered 2017-10-18: 1000 mg via ORAL
  Filled 2017-10-18: qty 2

## 2017-10-18 NOTE — ED Provider Notes (Signed)
[place] EMERGENCY DEPARTMENT Provider Note   CSN: [id number] Arrival date & time: 10/18/17  1135     History   Chief Complaint Chief Complaint  Patient presents with  . Fall    HPI [name] is a 83 y.o. female history of CK D, complete heart block with pacemaker, ho presenting with fall. She went to [facility] to get her medication refilled and was on the curb and has only slipped and fell backwards and hit her head. Denies any loss of consciousness or passing out. She was noted to have a hematoma in the back of her head so came here for evaluation. She denies any back pain or neck pain or weakness. Patient is on baby aspirin daily and not on any blood thinners.  The history is provided by the patient.    Past Medical History:  Diagnosis Date  . Arthritis    "left wrist; back" (07/29/2013)  . Cancer [facility]) Sept. 2015   Left Hand  -  Skin Cancer  . Carotid stenosis    occluded right intracranial ICA followed by Dr. Donnetta Hutching.  1-39% bilateral ICA stenosis  . Chronic combined systolic and diastolic CHF (congestive heart failure) ([place])   . Chronic kidney disease, unspecified   . Complete heart block ([place]) 07/29/2013   a. s/p MDT dual chamber PPM  . Coronary artery disease    s/p CABG  . Hyperlipidemia   . Hypertension   . Ischemic cardiomyopathy    a. s/p MDT single chamber ICD 2006 b. EF improved and device was downgraded to dual chamber PPM 2014  . Osteoporosis   . Overactive bladder   . Pacemaker   . Postoperative atrial fibrillation ([place])   . Pulmonary HTN ([place])    severe by echo 04/2016 - likely Group 2 from pulmonary venous HTN secondary to LV dysfunction and MR  . Severe mitral regurgitation    s/p MV ring.  Moderate MR and mild mitral stenosis on echo 04/2016  . Stroke ([place]) 07/2005  . Type II diabetes mellitus [facility])     Patient Active Problem List   Diagnosis Date Noted  . Heart murmur 05/14/2016  . Fall 11/17/2015  . Scalp laceration 11/17/2015  .  Facial contusion 11/17/2015  . Rib fracture 11/16/2015  . Paresthesia of both hands 01/18/2014  . Renal insufficiency 01/18/2014  . Lower extremity weakness 01/18/2014  . Coronary artery disease   . Type II diabetes mellitus ([place])   . Hyperlipidemia   . Carotid stenosis   . Complete heart block ([place])   . Severe mitral regurgitation   . Chronic combined systolic and diastolic CHF (congestive heart failure) ([place])   . Postoperative atrial fibrillation ([place]) 10/29/2013  . 6949 lead 07/21/2013  . Chronic kidney disease, unspecified   . Hypertension 04/30/2011    Past Surgical History:  Procedure Laterality Date  . APPENDECTOMY  1949  . CARDIAC DEFIBRILLATOR PLACEMENT  11/2005   by Dr Leonia Reeves (MDT) she has a [phone] fidelis lead  . CATARACT EXTRACTION W/ INTRAOCULAR LENS  IMPLANT, BILATERAL Bilateral 1980's  . CORONARY ARTERY BYPASS GRAFT  07/2005   "CABG X3" (07/29/2013)  . MITRAL VALVE REPLACEMENT  07/2005  . PERMANENT PACEMAKER INSERTION N/A 07/29/2013   upgrade of single chamber ICD to Medtronic Adapta L DR implanted with new RA and RV leads inserted  . SALPINGOOPHORECTOMY Bilateral 1976  . SHOULDER ARTHROSCOPY W/ ROTATOR CUFF REPAIR Right 1998  . TOTAL HIP ARTHROPLASTY Right 2001  OB History    No data available       Home Medications    Prior to Admission medications   Medication Sig Start Date End Date Taking? Authorizing Provider  aspirin EC 81 MG tablet Take 81 mg by mouth every morning.   Yes [provider]  carvedilol (COREG) 12.5 MG tablet TAKE 1 TABLET (12.5 MG TOTAL) BY MOUTH 2 (TWO) TIMES DAILY. 06/18/17  Yes Turner, Eber Hong, MD  Cholecalciferol (VITAMIN D) 2000 UNITS tablet Take 2,000 Units by mouth daily.    Yes [provider]  fish oil-omega-3 fatty acids 1000 MG capsule Take 1 g by mouth 2 (two) times daily.    Yes [provider]  glipiZIDE (GLUCOTROL XL) 10 MG 24 hr tablet Take 20 mg by mouth daily.   Yes [provider]    lidocaine (LIDODERM) 5 % Place 1 patch onto the skin daily. Remove & Discard patch within 12 hours or as directed by MD 08/21/17  Yes Tegeler, Gwenyth Allegra, MD  Multiple Vitamins-Minerals (CENTRUM SILVER PO) Take 1 tablet by mouth daily.    Yes [provider]  oxybutynin (DITROPAN) 5 MG tablet Take 1 tablet by mouth 2 (two) times daily. 05/02/15  Yes [provider]  sacubitril-valsartan (ENTRESTO) 49-51 MG Take 1 tablet by mouth 2 (two) times daily. 05/30/17  Yes Larey Dresser, MD  simvastatin (ZOCOR) 40 MG tablet TAKE 1 TABLET BY MOUTH AT BEDTIME 02/25/17  Yes Turner, Eber Hong, MD  spironolactone (ALDACTONE) 25 MG tablet TAKE 1 TABLET (25 MG TOTAL) BY MOUTH DAILY. 06/12/17  Yes Turner, Eber Hong, MD  vitamin B-12 (CYANOCOBALAMIN) 1000 MCG tablet Take 1,000 mcg by mouth daily.   Yes [provider]  acetaminophen (TYLENOL) 500 MG tablet Take 500 mg by mouth daily as needed (pain).     [provider]    Family History Family History  Problem Relation Age of Onset  . Cancer Mother        Theadora Rama   . Heart disease Mother        Before age 65  . Heart disease Sister        Before age 65-  CABG  . Cancer Sister        Lonia Chimera  . Diabetes Sister   . Hyperlipidemia Sister   . Heart attack Sister   . Diabetes Daughter   . Coronary artery disease Unknown        family hx of  . Hypertension Unknown        family hx of  . Diabetes Unknown        family hx of    Social History Social History  Substance Use Topics  . Smoking status: Never Smoker  . Smokeless tobacco: Never Used  . Alcohol use No     Allergies   Fosamax [alendronate]; Horse-derived products; Alendronate sodium; Clonidine derivatives; Darvocet [propoxyphene n-acetaminophen]; Lipitor [atorvastatin]; Sulfa drugs cross reactors; Tramadol; Vicodin [hydrocodone-acetaminophen]; Penicillins; and Septra [sulfamethoxazole-trimethoprim]   Review of Systems Review of Systems  Skin:        Some scalp hematoma   All other systems reviewed and are negative.    Physical Exam Updated Vital Signs BP (!) 149/66 (BP Location: Right Arm)   Pulse 80   Temp 98.6 F (37 C) (Oral)   Resp 16   Ht 5' (1.524 m)   Wt 59 kg (130 lb)   SpO2 96%   BMI 25.39 kg/m   Physical Exam  Constitutional: She is oriented to person, place, and time. She appears well-developed.  HENT:  Head: Normocephalic.  + posterior scalp hematoma, no obvious laceration   Eyes: Pupils are equal, round, and reactive to light. Conjunctivae and EOM are normal.  Neck: Normal range of motion. Neck supple.  Cardiovascular: Normal rate, regular rhythm and normal heart sounds.   Pulmonary/Chest: Effort normal and breath sounds normal. No respiratory distress. She has no wheezes. She has no rales.  Abdominal: Soft. Bowel sounds are normal. She exhibits no distension. There is no tenderness.  Musculoskeletal: Normal range of motion. She exhibits no edema.  No spinal tenderness   Neurological: She is alert and oriented to person, place, and time. No cranial nerve deficit. Coordination normal.  CN 2-12 intact. Nl strength throughout. Nl gait   Skin: Skin is warm.  Psychiatric: She has a normal mood and affect.  Nursing note and vitals reviewed.    ED Treatments / Results  Labs (all labs ordered are listed, but only abnormal results are displayed) Labs Reviewed - No data to display  EKG  EKG Interpretation None       Radiology Ct Head Wo Contrast  Result Date: 10/18/2017 CLINICAL DATA:  Head injury after fall.  No loss of consciousness. EXAM: CT HEAD WITHOUT CONTRAST TECHNIQUE: Contiguous axial images were obtained from the base of the skull through the vertex without intravenous contrast. COMPARISON:  CT scan of November 16, 2015. FINDINGS: Brain: Mild diffuse cortical atrophy is noted. Mild chronic ischemic white matter disease is noted. No mass effect or midline shift is noted. Ventricular size is  within normal limits. There is no evidence of mass lesion, hemorrhage or acute infarction. Vascular: No hyperdense vessel or unexpected calcification. Skull: Normal. Negative for fracture or focal lesion. Sinuses/Orbits: No acute finding. Other: Small left occipital scalp hematoma is noted. IMPRESSION: Small left occipital scalp hematoma. Mild diffuse cortical atrophy. Mild chronic ischemic white matter disease. No acute intracranial abnormality seen. Electronically Signed   By: Marijo Conception, M.D.   On: 10/18/2017 12:18    Procedures Procedures (including critical care time)  Medications Ordered in ED Medications  acetaminophen (TYLENOL) tablet 1,000 mg (1,000 mg Oral Given 10/18/17 1154)     Initial Impression / Assessment and Plan / ED Course  I have reviewed the triage vital signs and the nursing notes.  Pertinent labs & imaging results that were available during my care of the patient were reviewed by me and considered in my medical decision making (see chart for details).     [name] is a 83 y.o. female here with fall. Has scalp hematoma, nl neuro exam. Patient only on ASA. CT head showed no intra cranial hemorrhage. Stable for discharge.    Final Clinical Impressions(s) / ED Diagnoses   Final diagnoses:  None    New Prescriptions New Prescriptions   No medications on file     Drenda Freeze, MD 10/18/17 1240

## 2017-10-18 NOTE — Discharge Instructions (Signed)
Take tylenol, motrin for pain.   Apply ice on the scalp to help with swelling.   See your doctor.   Return to ER if you have worse swelling, headaches, vomiting, weakness.

## 2017-10-18 NOTE — ED Triage Notes (Signed)
Pt was getting out of her car and tripped and fell and hit her head. Pt has hematoma to the back of her head. Pt denies any complaints.

## 2017-10-31 ENCOUNTER — Telehealth: Payer: Self-pay | Admitting: Cardiology

## 2017-10-31 NOTE — Telephone Encounter (Signed)
New message   Patient calling because she does not know which medication she should be taking for BP. Patient going to see Dr Alroy Dust at 4:30pm today Please call   Pt c/o BP issue: STAT if pt c/o blurred vision, one-sided weakness or slurred speech  1. What are your last 5 BP readings? 175/85  2. Are you having any other symptoms (ex. Dizziness, headache, blurred vision, passed out)? DIZZINESS  3. What is your BP issue? she does not know which medication she should be taking for BP

## 2017-10-31 NOTE — Telephone Encounter (Signed)
Patient called to report that her BP was 175/85 this AM and she needed to know what to take to decrease it.  Confirmed she took BP prior to taking morning medications. She retook her BP on the phone. BP=141/75 and HR=73. She has also experienced some dizziness and wobbliness. She reports this has been going on for several months. Reiterated to patient that her BP is better after taking her medications and to keep her appointment today as scheduled. She understands Dr. Alroy Dust is more than welcome to call if symptoms are suspected to be cardiac.  He was grateful for call.

## 2017-11-13 ENCOUNTER — Other Ambulatory Visit: Payer: Self-pay | Admitting: Cardiology

## 2017-11-19 ENCOUNTER — Encounter: Payer: Self-pay | Admitting: Family

## 2017-11-19 ENCOUNTER — Ambulatory Visit: Payer: Medicare Other | Admitting: Family

## 2017-11-19 ENCOUNTER — Ambulatory Visit (HOSPITAL_COMMUNITY)
Admission: RE | Admit: 2017-11-19 | Discharge: 2017-11-19 | Disposition: A | Discharge status: Home / Self Care | Payer: Medicare Other | Source: Ambulatory Visit | Attending: Family | Admitting: Family

## 2017-11-19 VITALS — BP 147/78 | HR 74 | Temp 97.1°F | Resp 18 | Wt 134.7 lb

## 2017-11-19 DIAGNOSIS — I6522 Occlusion and stenosis of left carotid artery: Secondary | ICD-10-CM

## 2017-11-19 DIAGNOSIS — I6523 Occlusion and stenosis of bilateral carotid arteries: Secondary | ICD-10-CM | POA: Diagnosis not present

## 2017-11-19 DIAGNOSIS — I6521 Occlusion and stenosis of right carotid artery: Secondary | ICD-10-CM

## 2017-11-19 LAB — VAS US CAROTID
LCCAPSYS: 66 cm/s
LEFT ECA DIAS: 0 cm/s
LEFT VERTEBRAL DIAS: 18 cm/s
LICAPSYS: -84 cm/s
Left CCA dist dias: 20 cm/s
Left CCA dist sys: 83 cm/s
Left CCA prox dias: 12 cm/s
Left ICA dist dias: -29 cm/s
Left ICA dist sys: -76 cm/s
Left ICA prox dias: -22 cm/s
RIGHT CCA MID DIAS: 0 cm/s
RIGHT ECA DIAS: 0 cm/s
RIGHT VERTEBRAL DIAS: -15 cm/s
Right CCA prox dias: 0 cm/s
Right CCA prox sys: 66 cm/s
Right cca dist sys: -31 cm/s

## 2017-11-19 NOTE — Patient Instructions (Signed)

## 2017-11-19 NOTE — Progress Notes (Signed)
Chief Complaint: Follow up Extracranial Carotid Artery Stenosis   History of Present Illness  [name] is a 84 y.o. female patient of Dr. Donnetta Hutching whopresents today for continued discussion of her extracranial cerebrovascular occlusive disease. We have followed her for number of years with suspected right internal carotid artery occlusion. She underwent a repeat duplex and this suggested potential reconstituted flow in her right internal carotid artery. She continues to have no significant stenosis of her left carotid. It was recommended that she undergo a CT angiogram for further discussion. She does report dizziness and unsteady gait but no focal neurologic deficits.  Dr. Donnetta Hutching last saw pt on 11/02/14. At that time CT angiogram of her head and neck revealed patency of her right carotid bifurcation. She has a subtotal or total occlusion of the intracranial portion of her internal carotid artery making her extracranial internal carotid quite small. This was compared to MRA in 2006 showing no change. Left carotid system showed no stenosis at that time.  Dr. Donnetta Hutching discussed with the pt that this is a stable situation and he would not recommend any other evaluation or treatment; recommended that we see her again with carotid duplex in one year. She returns today for yearly surveillance.  She reports a stroke that affected her left foot at the end of her CABG surgery in July 2014, none subsequently.   She slipped on a curb and hit the back of head on 10-18-17, was evaluated in an ED.   Pt states she has felt "wobbly" when walking for years, has not improved nor worsened; states her legs feel like they may give way; this has not improved with her pacemaker insertion.  Pt Diabetic: Yes,in control,  A1C was 6.0 on 10-10-17 (review of records)  Pt smoker: non-smoker  Pt meds include:  Statin : Yes  Betablocker: Yes  ASA: Yes, 81 mg daily  Other anticoagulants/antiplatelets:  no   Past Medical History:  Diagnosis Date  . Arthritis    "left wrist; back" (07/29/2013)  . Cancer [facility]) Sept. 2015   Left Hand  -  Skin Cancer  . Carotid stenosis    occluded right intracranial ICA followed by Dr. Donnetta Hutching.  1-39% bilateral ICA stenosis  . Chronic combined systolic and diastolic CHF (congestive heart failure) ([place])   . Chronic kidney disease, unspecified   . Complete heart block ([place]) 07/29/2013   a. s/p MDT dual chamber PPM  . Coronary artery disease    s/p CABG  . Hyperlipidemia   . Hypertension   . Ischemic cardiomyopathy    a. s/p MDT single chamber ICD 2006 b. EF improved and device was downgraded to dual chamber PPM 2014  . Osteoporosis   . Overactive bladder   . Pacemaker   . Postoperative atrial fibrillation ([place])   . Pulmonary HTN ([place])    severe by echo 04/2016 - likely Group 2 from pulmonary venous HTN secondary to LV dysfunction and MR  . Severe mitral regurgitation    s/p MV ring.  Moderate MR and mild mitral stenosis on echo 04/2016  . Stroke ([place]) 07/2005  . Type II diabetes mellitus ([place])     Social History Social History   Tobacco Use  . Smoking status: Never Smoker  . Smokeless tobacco: Never Used  Substance Use Topics  . Alcohol use: No    Alcohol/week: 0.0 oz  . Drug use: No    Family History Family History  Problem Relation Age of Onset  .  Cancer Mother        Theadora Rama   . Heart disease Mother        Before age 72  . Heart disease Sister        Before age 59-  CABG  . Cancer Sister        Lonia Chimera  . Diabetes Sister   . Hyperlipidemia Sister   . Heart attack Sister   . Diabetes Daughter   . Coronary artery disease Unknown        family hx of  . Hypertension Unknown        family hx of  . Diabetes Unknown        family hx of    Surgical History Past Surgical History:  Procedure Laterality Date  . APPENDECTOMY  1949  . CARDIAC DEFIBRILLATOR PLACEMENT  11/2005   by Dr Leonia Reeves (MDT) she has a [phone] fidelis lead  .  CATARACT EXTRACTION W/ INTRAOCULAR LENS  IMPLANT, BILATERAL Bilateral 1980's  . CORONARY ARTERY BYPASS GRAFT  07/2005   "CABG X3" (07/29/2013)  . MITRAL VALVE REPLACEMENT  07/2005  . PERMANENT PACEMAKER INSERTION N/A 07/29/2013   upgrade of single chamber ICD to Medtronic Adapta L DR implanted with new RA and RV leads inserted  . SALPINGOOPHORECTOMY Bilateral 1976  . SHOULDER ARTHROSCOPY W/ ROTATOR CUFF REPAIR Right 1998  . TOTAL HIP ARTHROPLASTY Right 2001   Allergies     Fosamax [Alendronate]Anaphylaxis  Horse-derived ProductsHives  Alendronate SodiumOther (See Comments)  Clonidine DerivativesOther (See Comments)  Darvocet [Propoxyphene N-acetaminophen]Nausea And Vomiting  Lipitor [Atorvastatin]Swelling  Sulfa Drugs Cross ReactorsHives, Itching  TramadolNausea And Vomiting  Vicodin [Hydrocodone-acetaminophen]Other (See Comments)  PenicillinsRash  Septra [Sulfamethoxazole-trimethoprim]Rash       Current Outpatient Medications  Medication Sig Dispense Refill  . acetaminophen (TYLENOL) 500 MG tablet Take 500 mg by mouth daily as needed (pain).     Marland Kitchen aspirin EC 81 MG tablet Take 81 mg by mouth every morning.    . carvedilol (COREG) 12.5 MG tablet TAKE 1 TABLET (12.5 MG TOTAL) BY MOUTH 2 (TWO) TIMES DAILY. 60 tablet 11  . Cholecalciferol (VITAMIN D) 2000 UNITS tablet Take 2,000 Units by mouth daily.     . fish oil-omega-3 fatty acids 1000 MG capsule Take 1 g by mouth 2 (two) times daily.     Marland Kitchen glipiZIDE (GLUCOTROL XL) 10 MG 24 hr tablet Take 20 mg by mouth daily.    Marland Kitchen lidocaine (LIDODERM) 5 % Place 1 patch onto the skin daily. Remove & Discard patch within 12 hours or as directed by MD 14 patch 0  . Multiple Vitamins-Minerals (CENTRUM SILVER PO) Take 1 tablet by mouth daily.     Marland Kitchen oxybutynin (DITROPAN) 5 MG tablet Take 1 tablet by mouth 2 (two) times daily.  3  . sacubitril-valsartan (ENTRESTO) 49-51 MG Take 1 tablet by mouth 2 (two) times daily. 60 tablet 6  . simvastatin (ZOCOR) 40  MG tablet TAKE 1 TABLET BY MOUTH AT BEDTIME 30 tablet 12  . spironolactone (ALDACTONE) 25 MG tablet TAKE 1 TABLET (25 MG TOTAL) BY MOUTH DAILY. 90 tablet 3  . vitamin B-12 (CYANOCOBALAMIN) 1000 MCG tablet Take 1,000 mcg by mouth daily.     No current facility-administered medications for this visit.     Review of Systems : See HPI for pertinent positives and negatives.  Physical Examination  Vitals:   11/19/17 1349 11/19/17 1351  BP: (!) 145/76 (!) 147/78  Pulse: 74   Resp: 18   Temp: (!)  97.1 F (36.2 C)   TempSrc: Oral   SpO2: 98%   Weight: 134 lb 11.2 oz (61.1 kg)    Body mass index is 26.31 kg/m.  General: WDWN elderly female in NAD  GAIT:slow and deliberate.  Eyes: PERRLA  Pulmonary: Respirations are non labored, CTAB, no rales, no rhonchi, or wheezing.  Cardiac: Regular rhythm, positive murmur. Pacemaker palpated left side of chest.   VASCULAR EXAM Carotid Bruits Left Right   Negative  Negative    Abdominal aortic pulse is not palpable.  Radial pulses are 2+ palpable and equal.   LE Pulses  LEFT  RIGHT   FEMORAL  not palpable not palpable   POPLITEAL  not palpable  not palpable  POSTERIOR TIBIAL  not palpable  1+ palpable   DORSALIS PEDIS ANTERIOR TIBIAL  Faintly palpable  not palpable    Gastrointestinal: soft, nontender, BS WNL, no r/g, no palpable masses.  Musculoskeletal: No muscle atrophy/wasting. M/S 4/5 in UE's, 3/5 in LE's, Extremities without ischemic changes.  Neurologic: A&O X 3; appropriate affect, normal sensation, speech is normal, CN 2-12 intact, Pain and light touch intact in extremities, Motor exam as listed above.   Assessment: [name] is a 83 y.o. female whom we have followed for a number of years with suspected right internal carotid artery occlusion. She underwent a repeat duplex and this suggested potential reconstituted flow in her right internal carotid artery.  She reports a stroke  that affected her left foot at the end of her CABG surgery in July 2014, none subsequently.   Dr. Donnetta Hutching last saw pt on 11/02/14. At that time CT angiogram of her head and neck revealed patency of her right carotid bifurcation. She has a subtotal or total occlusion of the intracranial portion of her internal carotid artery making her extracranial internal carotid quite small. This was compared to MRA in 2006 showing no change. Left carotid system shows no stenosis Dr. Donnetta Hutching discussed with the pt that this is a stable situation and he would not recommend any other evaluation or treatment.  Pt asked it it was really necessary for [place] to continue to see her, states "I see 8 doctors, and I don't know that I need to". I explained that since one of the arteries that supplies her brain is blocked, that it's a good idea to monitor the other arteries; she agreed to 18 months return.   DATA Carotid Duplex (11/19/17): Consistent with the results from the 11/02/14 CTA: dampened resistive flow throughout the right carotid artery system suggestive of distal disease, known cavernous carotid atherosclerotic disease.  <40% left ICA stenosis.  Bilateral vertebral artery flow is antegrade. Bilateral subclavian artery waveforms are normal.  No significant change compared to last exam on 11-13-17.   Plan: Follow-up in 18 months with Carotid Duplex scan.     I discussed in depth with the patient the nature of atherosclerosis, and emphasized the importance of maximal medical management including strict control of blood pressure, blood glucose, and lipid levels, obtaining regular exercise, and continued cessation of smoking.  The patient is aware that without maximal medical management the underlying atherosclerotic disease process will progress, limiting the benefit of any interventions. The patient was given information about stroke prevention and what symptoms should prompt the patient to seek immediate medical  care. Thank you for allowing [place] to participate in this patient's care.  Clemon Chambers, RN, MSN, FNP-C [facility] Office: [phone]  Clinic Physician: Trula Slade on  call  11/19/17 2:11 PM

## 2017-12-09 ENCOUNTER — Ambulatory Visit (INDEPENDENT_AMBULATORY_CARE_PROVIDER_SITE_OTHER): Payer: Medicare Other | Admitting: *Deleted

## 2017-12-09 DIAGNOSIS — I442 Atrioventricular block, complete: Secondary | ICD-10-CM

## 2017-12-09 NOTE — Progress Notes (Signed)
Remote pacemaker transmission.   

## 2017-12-12 ENCOUNTER — Ambulatory Visit: Payer: Medicare Other | Admitting: Cardiology

## 2017-12-12 LAB — CUP PACEART REMOTE DEVICE CHECK
Battery Impedance: 231 Ohm
Battery Remaining Longevity: 97 mo
Brady Statistic AP VP Percent: 98 %
Brady Statistic AS VP Percent: 2 %
Implantable Lead Implant Date: 20140730
Implantable Lead Location: 753860
Implantable Lead Model: 5076
Implantable Lead Model: 5076
Lead Channel Impedance Value: 422 Ohm
Lead Channel Impedance Value: 439 Ohm
Lead Channel Pacing Threshold Amplitude: 0.5 V
Lead Channel Pacing Threshold Amplitude: 0.75 V
Lead Channel Setting Pacing Amplitude: 2.5 V
Lead Channel Setting Sensing Sensitivity: 2.8 mV
MDC IDC LEAD IMPLANT DT: 20140730
MDC IDC LEAD LOCATION: 753859
MDC IDC MSMT BATTERY VOLTAGE: 2.78 V
MDC IDC MSMT LEADCHNL RA PACING THRESHOLD PULSEWIDTH: 0.4 ms
MDC IDC MSMT LEADCHNL RV PACING THRESHOLD PULSEWIDTH: 0.4 ms
MDC IDC PG IMPLANT DT: 20140730
MDC IDC SESS DTM: 20181210154039
MDC IDC SET LEADCHNL RA PACING AMPLITUDE: 2 V
MDC IDC SET LEADCHNL RV PACING PULSEWIDTH: 0.4 ms
MDC IDC STAT BRADY AP VS PERCENT: 0 %
MDC IDC STAT BRADY AS VS PERCENT: 0 %

## 2017-12-13 ENCOUNTER — Encounter: Payer: Self-pay | Admitting: Cardiology

## 2017-12-15 ENCOUNTER — Other Ambulatory Visit (HOSPITAL_COMMUNITY): Payer: Self-pay | Admitting: Cardiology

## 2018-01-27 ENCOUNTER — Telehealth (HOSPITAL_COMMUNITY): Payer: Self-pay | Admitting: Pharmacist

## 2018-01-27 NOTE — Telephone Encounter (Signed)
Ms. Mayor was charged $45 for her Delene Loll yesterday even though she has PANF grant with remaining balance of $100.52 through 08/28/18. Called and spoke with pharmacy tech at [facility] who stated that it was billed to both her [place] and [place]. I have advised Ms. Duncombe to call me next time she needs to fill her Delene Loll to verify what the issue was.   Ruta Hinds. Velva Harman, PharmD, BCPS, CPP Clinical Pharmacist Phone: [phone] 01/27/2018 2:58 PM

## 2018-02-12 ENCOUNTER — Telehealth (HOSPITAL_COMMUNITY): Payer: Self-pay | Admitting: Pharmacist

## 2018-02-12 NOTE — Telephone Encounter (Signed)
Spoke with [facility] pharmacy who was able to run Ms. Bouillon's Entresto through [place] for no charge to her.   Ruta Hinds. Velva Harman, PharmD, BCPS, CPP Clinical Pharmacist Phone: [phone] 02/12/2018 10:24 AM

## 2018-02-20 ENCOUNTER — Ambulatory Visit: Payer: Medicare Other | Admitting: Cardiology

## 2018-02-20 ENCOUNTER — Encounter: Payer: Self-pay | Admitting: Cardiology

## 2018-02-20 VITALS — BP 128/68 | HR 80 | Ht 60.0 in | Wt 134.2 lb

## 2018-02-20 DIAGNOSIS — I6523 Occlusion and stenosis of bilateral carotid arteries: Secondary | ICD-10-CM

## 2018-02-20 DIAGNOSIS — I442 Atrioventricular block, complete: Secondary | ICD-10-CM | POA: Diagnosis not present

## 2018-02-20 DIAGNOSIS — E7800 Pure hypercholesterolemia, unspecified: Secondary | ICD-10-CM

## 2018-02-20 DIAGNOSIS — I420 Dilated cardiomyopathy: Secondary | ICD-10-CM | POA: Diagnosis not present

## 2018-02-20 DIAGNOSIS — I2510 Atherosclerotic heart disease of native coronary artery without angina pectoris: Secondary | ICD-10-CM | POA: Diagnosis not present

## 2018-02-20 DIAGNOSIS — I340 Nonrheumatic mitral (valve) insufficiency: Secondary | ICD-10-CM

## 2018-02-20 DIAGNOSIS — I5042 Chronic combined systolic (congestive) and diastolic (congestive) heart failure: Secondary | ICD-10-CM

## 2018-02-20 DIAGNOSIS — I480 Paroxysmal atrial fibrillation: Secondary | ICD-10-CM

## 2018-02-20 DIAGNOSIS — I10 Essential (primary) hypertension: Secondary | ICD-10-CM | POA: Diagnosis not present

## 2018-02-20 LAB — LIPID PANEL
Chol/HDL Ratio: 4.9 ratio — ABNORMAL HIGH (ref 0.0–4.4)
Cholesterol, Total: 250 mg/dL — ABNORMAL HIGH (ref 100–199)
HDL: 51 mg/dL (ref >39)
LDL Calculated: 173 mg/dL — ABNORMAL HIGH (ref 0–99)
Triglycerides: 129 mg/dL (ref 0–149)
VLDL Cholesterol Cal: 26 mg/dL (ref 5–40)

## 2018-02-20 LAB — BASIC METABOLIC PANEL
BUN/Creatinine Ratio: 17 (ref 12–28)
BUN: 24 mg/dL (ref 8–27)
CO2: 20 mmol/L (ref 20–29)
CREATININE: 1.45 mg/dL — AB (ref 0.57–1.00)
Calcium: 10.7 mg/dL — ABNORMAL HIGH (ref 8.7–10.3)
Chloride: 105 mmol/L (ref 96–106)
GFR calc Af Amer: 37 mL/min/{1.73_m2} — ABNORMAL LOW (ref >59)
GFR, EST NON AFRICAN AMERICAN: 32 mL/min/{1.73_m2} — AB (ref >59)
Glucose: 130 mg/dL — ABNORMAL HIGH (ref 65–99)
Potassium: 5.3 mmol/L — ABNORMAL HIGH (ref 3.5–5.2)
SODIUM: 143 mmol/L (ref 134–144)

## 2018-02-20 LAB — HEPATIC FUNCTION PANEL
ALBUMIN: 4.5 g/dL (ref 3.5–4.7)
ALK PHOS: 71 IU/L (ref 39–117)
ALT: 16 IU/L (ref 0–32)
AST: 21 IU/L (ref 0–40)
BILIRUBIN TOTAL: 0.6 mg/dL (ref 0.0–1.2)
Bilirubin, Direct: 0.13 mg/dL (ref 0.00–0.40)
Total Protein: 6.8 g/dL (ref 6.0–8.5)

## 2018-02-20 NOTE — Patient Instructions (Signed)
Medication Instructions:  Your physician recommends that you continue on your current medications as directed. Please refer to the Current Medication list given to you today.  Labwork: Today for kidney function, liver function, and fasting lipids  Testing/Procedures: None ordered   Follow-Up: Your physician wants you to follow-up in: 6 months with Dr. Radford Pax. You will receive a reminder letter in the mail two months in advance. If you don't receive a letter, please call our office to schedule the follow-up appointment.  Any Other Special Instructions Will Be Listed Below (If Applicable).  Thank you for choosing [facility], RN  [phone]   If you need a refill on your cardiac medications before your next appointment, please call your pharmacy.

## 2018-02-20 NOTE — Progress Notes (Signed)
Cardiology Office Note:    Date:  02/20/2018   ID:  [name], DOB 1928-08-04, MRN [id number]  PCP:  Mayra Neer, MD  Cardiologist:  No primary care provider on file.    Referring MD: Mayra Neer, MD   Chief Complaint  Patient presents with  . Coronary Artery Disease  . Hypertension  . Hyperlipidemia  . Congestive Heart Failure    History of Present Illness:    [name] is a 84 y.o. female with a hx of ischemic DCM, chronic diastolic CHF, ASCAD s/p CABG, carotid artery stenosis, dyslipidemia, MR s/p MV repair, CHB s/p PPM, PAF with last pacer check with 0% afib burdern and HTN.  She is followed in device clinic and AHF clinic.  She is here today for followup and is doing well.  She denies any chest pain or pressure, SOB, DOE, PND, orthopnea, LE edema, dizziness, palpitations or syncope. She complains that she is having a lot of shoulder pain and is going to address it with her PCP at her next appt.  She is compliant with her meds and is tolerating meds with no SE.    Past Medical History:  Diagnosis Date  . Arthritis    "left wrist; back" (07/29/2013)  . Cancer [facility]) Sept. 2015   Left Hand  -  Skin Cancer  . Carotid stenosis    occluded right intracranial ICA followed by Dr. Donnetta Hutching.  1-39% left ICA stenosis by dopplers 10/2017  . Chronic combined systolic and diastolic CHF (congestive heart failure) ([place])   . Chronic kidney disease, unspecified   . Complete heart block ([place]) 07/29/2013   a. s/p MDT dual chamber PPM  . Coronary artery disease    s/p CABG  . Hyperlipidemia   . Hypertension   . Ischemic cardiomyopathy     s/p MDT single chamber ICD 2006.  EF improved and device was downgraded to dual chamber PPM 2014.  EF normalized by echo 2014 and at time of battery changeout ICD changed to PPM.  EF then decreased to 20-25% by echo 04/2016.  Seen in AHF clinic and plan was for not pursue cath given her lack of sx and advanced age. Her Delene Loll was increased and it  was decided that she was too high risk for CRTP   . Osteoporosis   . Overactive bladder   . Pacemaker   . Postoperative atrial fibrillation ([place])   . Pulmonary HTN ([place])    severe by echo 04/2016 - likely Group 2 from pulmonary venous HTN secondary to LV dysfunction and MR  . Severe mitral regurgitation    s/p MV ring.  Moderate MR and mild mitral stenosis on echo 04/2016  . Stroke ([place]) 07/2005  . Type II diabetes mellitus ([place])     Past Surgical History:  Procedure Laterality Date  . APPENDECTOMY  1949  . CARDIAC DEFIBRILLATOR PLACEMENT  11/2005   by Dr Leonia Reeves (MDT) she has a [phone] fidelis lead  . CATARACT EXTRACTION W/ INTRAOCULAR LENS  IMPLANT, BILATERAL Bilateral 1980's  . CORONARY ARTERY BYPASS GRAFT  07/2005   "CABG X3" (07/29/2013)  . MITRAL VALVE REPLACEMENT  07/2005  . PERMANENT PACEMAKER INSERTION N/A 07/29/2013   upgrade of single chamber ICD to Medtronic Adapta L DR implanted with new RA and RV leads inserted  . SALPINGOOPHORECTOMY Bilateral 1976  . SHOULDER ARTHROSCOPY W/ ROTATOR CUFF REPAIR Right 1998  . TOTAL HIP ARTHROPLASTY Right 2001    Current Medications: Current Meds  Medication Sig  .  acetaminophen (TYLENOL) 500 MG tablet Take 500 mg by mouth daily as needed (pain).   Marland Kitchen aspirin EC 81 MG tablet Take 81 mg by mouth every morning.  . carvedilol (COREG) 12.5 MG tablet TAKE 1 TABLET (12.5 MG TOTAL) BY MOUTH 2 (TWO) TIMES DAILY.  Marland Kitchen Cholecalciferol (VITAMIN D) 2000 UNITS tablet Take 2,000 Units by mouth daily.   Marland Kitchen ENTRESTO 49-51 MG TAKE 1 TABLET BY MOUTH TWICE A DAY  . fish oil-omega-3 fatty acids 1000 MG capsule Take 1 g by mouth 2 (two) times daily.   Marland Kitchen glipiZIDE (GLUCOTROL XL) 10 MG 24 hr tablet Take 20 mg by mouth daily.  Marland Kitchen lidocaine (LIDODERM) 5 % Place 1 patch onto the skin daily. Remove & Discard patch within 12 hours or as directed by MD  . Multiple Vitamins-Minerals (CENTRUM SILVER PO) Take 1 tablet by mouth daily.   Marland Kitchen oxybutynin (DITROPAN) 5 MG tablet Take  1 tablet by mouth 2 (two) times daily.  Marland Kitchen spironolactone (ALDACTONE) 25 MG tablet TAKE 1 TABLET (25 MG TOTAL) BY MOUTH DAILY.  . vitamin B-12 (CYANOCOBALAMIN) 1000 MCG tablet Take 1,000 mcg by mouth daily.     Allergies:   Fosamax [alendronate]; Horse-derived products; Alendronate sodium; Clonidine derivatives; Darvocet [propoxyphene n-acetaminophen]; Lipitor [atorvastatin]; Sulfa drugs cross reactors; Tramadol; Vicodin [hydrocodone-acetaminophen]; Penicillins; and Septra [sulfamethoxazole-trimethoprim]   Social History   Socioeconomic History  . Marital status: Widowed    Spouse name: None  . Number of children: None  . Years of education: None  . Highest education level: None  Social Needs  . Financial resource strain: None  . Food insecurity - worry: None  . Food insecurity - inability: None  . Transportation needs - medical: None  . Transportation needs - non-medical: None  Occupational History  . Occupation: retired Marine scientist  Tobacco Use  . Smoking status: Never Smoker  . Smokeless tobacco: Never Used  Substance and Sexual Activity  . Alcohol use: No    Alcohol/week: 0.0 oz  . Drug use: No  . Sexual activity: No  Other Topics Concern  . None  Social History Narrative  . None     Family History: The patient's family history includes Cancer in her mother and sister; Coronary artery disease in her unknown relative; Diabetes in her daughter, sister, and unknown relative; Heart attack in her sister; Heart disease in her mother and sister; Hyperlipidemia in her sister; Hypertension in her unknown relative.  ROS:   Please see the history of present illness.    Review of Systems  Neurological: Positive for loss of balance.    All other systems reviewed and negative.   EKGs/Labs/Other Studies Reviewed:    The following studies were reviewed today: none  EKG:  EKG is ordered today and showed AV paced rhythm  Recent Labs: 05/16/2017: ALT 14 06/06/2017: BUN 20; Creatinine,  Ser 0.95; Potassium 5.0; Sodium 141   Recent Lipid Panel    Component Value Date/Time   CHOL 148 05/16/2017 0937   TRIG 63 05/16/2017 0937   HDL 59 05/16/2017 0937   CHOLHDL 2.5 05/16/2017 0937   CHOLHDL 2.9 12/18/2016 0852   VLDL 17 12/18/2016 0852   LDLCALC 76 05/16/2017 0937    Physical Exam:    VS:  BP 128/68   Pulse 80   Ht 5' (1.524 m)   Wt 134 lb 3.2 oz (60.9 kg)   SpO2 97%   BMI 26.21 kg/m     Wt Readings from Last 3 Encounters:  02/20/18  134 lb 3.2 oz (60.9 kg)  11/19/17 134 lb 11.2 oz (61.1 kg)  10/18/17 130 lb (59 kg)     GEN:  Well nourished, well developed in no acute distress HEENT: Normal NECK: No JVD; No carotid bruits LYMPHATICS: No lymphadenopathy CARDIAC: RRR, no murmurs, rubs, gallops RESPIRATORY:  Clear to auscultation without rales, wheezing or rhonchi  ABDOMEN: Soft, non-tender, non-distended MUSCULOSKELETAL:  No edema; No deformity  SKIN: Warm and dry NEUROLOGIC:  Alert and oriented x 3 PSYCHIATRIC:  Normal affect   ASSESSMENT:    1. Coronary artery disease involving native coronary artery of native heart without angina pectoris   2. DCM (dilated cardiomyopathy) ([place])   3. Essential hypertension   4. Bilateral carotid artery stenosis   5. Severe mitral regurgitation   6. Complete heart block ([place])   7. Chronic combined systolic and diastolic CHF (congestive heart failure) ([place])   8. Pure hypercholesterolemia   9. PAF (paroxysmal atrial fibrillation) ([facility])    PLAN:    In order of problems listed above:  1.  ASCAD - s/p remote CABG.  She has no anginal symptoms.  Nuclear stress test 05/2016 at time of EF drop showed no ischemia.  She denies any angina sx.  She will continue on ASA 81mg  daily and BB.  She is statin intolerant.     2.  DCM - ischemic in origin with last echo in 2014 showing resolution with EF 55%.  She had her ICD replaced with a PPM and then subsequent echo 04/2016 showed EF 20-25% with RV dysfunction.  Nuclear stress  test 05/2016 showed no ischemia.  She was seen in AHF clinic and plan was for not pursue cath given her lack of sx and advanced age. Her Delene Loll was increased and it was decided that she was too high risk for CRTP upgrade with advanced age and fragility. Pt opted for conservative approach.  3.  HTN - her BP is well controlled on exam today.  She will continue on Carvedilol 12.5mg  BID and Entresto 49-51mg  BID.    4.  Bilateral carotid artery stenosis - RICA occluded and LICA with 4-09% stenosis - followed by Dr. Donnetta Hutching  5.  Severe MR s/p MV repair at the time of her CABG - her last echo showed mild MS with mean MV gradient of 66mm and MVA by PHT of 1.28cm 2.  6.  CHB s/p PPM - she is followed in device clinic.    7.  Chronic combined systolic/diastolic CHF - she appears euvolemic on exam today.  Her weight is stable.  She will continue on current doses of Entresto, spironolactone and carvedilol. She has not needed Lasix.   I will check a BMET today.    8.  Hyperlipidemia with LDL goal < 70.  She is statin intolerant and given her advanced age a comorbidities I will not pursue PCSK 9 drug.    9.  PAF - her last pacer check showed her afib burden to be 0%.  She is not on anticoagulation due to advanced age and frequent falls.  I have spent a total of 45 minutes with patient reviewing prior echo, nuclear stress test , EKGs, labs and examining patient as well as establishing an assessment and plan that was discussed with the patient.  > 50% of time was spent in direct patient care.  Patient has multiple medical problems requiring extensive problem solving.    Medication Adjustments/Labs and Tests Ordered: Current medicines are reviewed at length with  the patient today.  Concerns regarding medicines are outlined above.  Orders Placed This Encounter  Procedures  . Basic metabolic panel  . Hepatic function panel  . Lipid panel  . EKG 12-Lead   No orders of the defined types were placed in this  encounter.   Signed, Fransico Him, MD  02/20/2018 10:58 AM    [place]

## 2018-02-21 ENCOUNTER — Telehealth: Payer: Self-pay

## 2018-02-21 ENCOUNTER — Telehealth: Payer: Self-pay | Admitting: Cardiology

## 2018-02-21 DIAGNOSIS — R7989 Other specified abnormal findings of blood chemistry: Secondary | ICD-10-CM

## 2018-02-21 NOTE — Telephone Encounter (Signed)
Patient wanted clarification on what meds not to take. I explained to patient to avoid potassium supplements and diuretics. Patient verbalized understanding and thanked me for the call

## 2018-02-21 NOTE — Telephone Encounter (Signed)
New message    Patient needs to know what medicine she is not suppose to take, she is upset with the information she was given ?

## 2018-02-21 NOTE — Telephone Encounter (Signed)
Notes recorded by Teressa Senter, RN on 02/21/2018 at 2:57 PM EST Encouraged patient to avoid any potassium supplements and do not take any prn spironolactone. Advised patient to call office with any LE swelling. Patient stated she would have to call back to schedule labs because her niece has to drive her. Patient thankful for the call   Notes recorded by Sueanne Margarita, MD on 02/21/2018 at 10:00 AM EST Please encourage her to avoid any potassium supplements. Please stop any PRN spironolactone and call if she has LE edema. Repeat BMET on Monday

## 2018-02-24 ENCOUNTER — Other Ambulatory Visit: Payer: Medicare Other | Admitting: *Deleted

## 2018-02-24 DIAGNOSIS — R7989 Other specified abnormal findings of blood chemistry: Secondary | ICD-10-CM

## 2018-02-24 LAB — BASIC METABOLIC PANEL
BUN / CREAT RATIO: 17 (ref 12–28)
BUN: 22 mg/dL (ref 8–27)
CALCIUM: 10 mg/dL (ref 8.7–10.3)
CHLORIDE: 105 mmol/L (ref 96–106)
CO2: 22 mmol/L (ref 20–29)
Creatinine, Ser: 1.31 mg/dL — ABNORMAL HIGH (ref 0.57–1.00)
GFR, EST AFRICAN AMERICAN: 42 mL/min/{1.73_m2} — AB (ref >59)
GFR, EST NON AFRICAN AMERICAN: 37 mL/min/{1.73_m2} — AB (ref >59)
Glucose: 97 mg/dL (ref 65–99)
Potassium: 4.6 mmol/L (ref 3.5–5.2)
Sodium: 140 mmol/L (ref 134–144)

## 2018-03-10 ENCOUNTER — Ambulatory Visit (INDEPENDENT_AMBULATORY_CARE_PROVIDER_SITE_OTHER): Payer: Medicare Other | Admitting: *Deleted

## 2018-03-10 DIAGNOSIS — I420 Dilated cardiomyopathy: Secondary | ICD-10-CM | POA: Diagnosis not present

## 2018-03-10 NOTE — Progress Notes (Signed)
Remote pacemaker transmission.   

## 2018-03-12 ENCOUNTER — Encounter: Payer: Self-pay | Admitting: Cardiology

## 2018-03-13 LAB — CUP PACEART REMOTE DEVICE CHECK
Battery Impedance: 232 Ohm
Battery Remaining Longevity: 98 mo
Brady Statistic AP VP Percent: 97 %
Implantable Lead Implant Date: 20140730
Implantable Lead Implant Date: 20140730
Implantable Lead Location: 753860
Implantable Lead Model: 5076
Implantable Pulse Generator Implant Date: 20140730
Lead Channel Impedance Value: 428 Ohm
Lead Channel Setting Pacing Amplitude: 2 V
Lead Channel Setting Pacing Amplitude: 2.5 V
Lead Channel Setting Pacing Pulse Width: 0.4 ms
Lead Channel Setting Sensing Sensitivity: 2.8 mV
MDC IDC LEAD LOCATION: 753859
MDC IDC MSMT BATTERY VOLTAGE: 2.78 V
MDC IDC MSMT LEADCHNL RA PACING THRESHOLD AMPLITUDE: 0.5 V
MDC IDC MSMT LEADCHNL RA PACING THRESHOLD PULSEWIDTH: 0.4 ms
MDC IDC MSMT LEADCHNL RV IMPEDANCE VALUE: 485 Ohm
MDC IDC MSMT LEADCHNL RV PACING THRESHOLD AMPLITUDE: 0.75 V
MDC IDC MSMT LEADCHNL RV PACING THRESHOLD PULSEWIDTH: 0.4 ms
MDC IDC SESS DTM: 20190311141504
MDC IDC STAT BRADY AP VS PERCENT: 1 %
MDC IDC STAT BRADY AS VP PERCENT: 2 %
MDC IDC STAT BRADY AS VS PERCENT: 0 %

## 2018-05-08 ENCOUNTER — Other Ambulatory Visit: Payer: Self-pay | Admitting: Family Medicine

## 2018-05-20 ENCOUNTER — Other Ambulatory Visit (HOSPITAL_COMMUNITY): Payer: Self-pay | Admitting: *Deleted

## 2018-05-20 ENCOUNTER — Telehealth (HOSPITAL_COMMUNITY): Payer: Self-pay | Admitting: *Deleted

## 2018-05-20 MED ORDER — SACUBITRIL-VALSARTAN 49-51 MG PO TABS: 1.0000 | ORAL_TABLET | Freq: Two times a day (BID) | ORAL | 11 refills | Status: DC

## 2018-05-20 NOTE — Telephone Encounter (Addendum)
[employer] patient assistance application completed/signed and faxed to them today at [phone].  Gross Monthly income documents provided.  Application never approved, PAN foundation in place now.

## 2018-06-03 ENCOUNTER — Other Ambulatory Visit: Payer: Self-pay | Admitting: Cardiology

## 2018-06-04 ENCOUNTER — Other Ambulatory Visit: Payer: Self-pay | Admitting: Cardiology

## 2018-06-09 ENCOUNTER — Ambulatory Visit (INDEPENDENT_AMBULATORY_CARE_PROVIDER_SITE_OTHER): Payer: Medicare Other | Admitting: *Deleted

## 2018-06-09 DIAGNOSIS — I420 Dilated cardiomyopathy: Secondary | ICD-10-CM

## 2018-06-09 NOTE — Progress Notes (Signed)
Remote pacemaker transmission.   

## 2018-06-10 ENCOUNTER — Encounter: Payer: Self-pay | Admitting: Cardiology

## 2018-06-10 LAB — CUP PACEART REMOTE DEVICE CHECK
Battery Impedance: 280 Ohm
Battery Remaining Longevity: 93 mo
Brady Statistic AP VP Percent: 92 %
Brady Statistic AS VP Percent: 2 %
Implantable Lead Implant Date: 20140730
Implantable Lead Model: 5076
Implantable Lead Model: 5076
Implantable Pulse Generator Implant Date: 20140730
Lead Channel Impedance Value: 428 Ohm
Lead Channel Impedance Value: 493 Ohm
Lead Channel Pacing Threshold Amplitude: 0.5 V
Lead Channel Pacing Threshold Amplitude: 0.875 V
Lead Channel Setting Pacing Amplitude: 2 V
Lead Channel Setting Pacing Amplitude: 2.5 V
MDC IDC LEAD IMPLANT DT: 20140730
MDC IDC LEAD LOCATION: 753859
MDC IDC LEAD LOCATION: 753860
MDC IDC MSMT BATTERY VOLTAGE: 2.79 V
MDC IDC MSMT LEADCHNL RA PACING THRESHOLD PULSEWIDTH: 0.4 ms
MDC IDC MSMT LEADCHNL RV PACING THRESHOLD PULSEWIDTH: 0.4 ms
MDC IDC SESS DTM: 20190610131558
MDC IDC SET LEADCHNL RV PACING PULSEWIDTH: 0.4 ms
MDC IDC SET LEADCHNL RV SENSING SENSITIVITY: 4 mV
MDC IDC STAT BRADY AP VS PERCENT: 6 %
MDC IDC STAT BRADY AS VS PERCENT: 0 %

## 2018-06-18 ENCOUNTER — Other Ambulatory Visit: Payer: Self-pay | Admitting: Cardiology

## 2018-06-20 ENCOUNTER — Other Ambulatory Visit: Payer: Self-pay | Admitting: Cardiology

## 2018-06-24 NOTE — Telephone Encounter (Signed)
PAN foundation opened back up.  Patient enrolled  And has $1,000 available towards Entresto from 06/1818-07/17/19.

## 2018-06-24 NOTE — Telephone Encounter (Signed)
Charted wrong, patient has $1,000 available for Entresto from 08/29/17 through 08/28/18.

## 2018-06-30 ENCOUNTER — Other Ambulatory Visit (HOSPITAL_COMMUNITY): Payer: Self-pay | Admitting: Cardiology

## 2018-06-30 NOTE — Telephone Encounter (Signed)
Patient has also been approved for Patient assistance through [employer] as well. Approved through the end of 2019.

## 2018-07-07 ENCOUNTER — Other Ambulatory Visit (HOSPITAL_COMMUNITY): Payer: Self-pay | Admitting: *Deleted

## 2018-07-07 ENCOUNTER — Encounter: Payer: Self-pay | Admitting: Cardiology

## 2018-07-07 MED ORDER — SACUBITRIL-VALSARTAN 49-51 MG PO TABS: 1.0000 | ORAL_TABLET | Freq: Two times a day (BID) | ORAL | 11 refills | Status: AC

## 2018-08-16 ENCOUNTER — Other Ambulatory Visit (HOSPITAL_COMMUNITY): Payer: Self-pay | Admitting: Cardiology

## 2018-08-19 ENCOUNTER — Emergency Department (HOSPITAL_COMMUNITY): Payer: Medicare Other

## 2018-08-19 ENCOUNTER — Inpatient Hospital Stay (HOSPITAL_COMMUNITY)
Admission: EM | Admit: 2018-08-19 | Discharge: 2018-08-25 | DRG: 481 | Disposition: A | Discharge status: Skilled Nursing Facility | Payer: Medicare Other | Attending: Internal Medicine | Admitting: Internal Medicine

## 2018-08-19 ENCOUNTER — Encounter (HOSPITAL_COMMUNITY): Payer: Self-pay

## 2018-08-19 ENCOUNTER — Other Ambulatory Visit: Payer: Self-pay

## 2018-08-19 DIAGNOSIS — Z79899 Other long term (current) drug therapy: Secondary | ICD-10-CM

## 2018-08-19 DIAGNOSIS — I255 Ischemic cardiomyopathy: Secondary | ICD-10-CM | POA: Diagnosis present

## 2018-08-19 DIAGNOSIS — E1151 Type 2 diabetes mellitus with diabetic peripheral angiopathy without gangrene: Secondary | ICD-10-CM | POA: Diagnosis not present

## 2018-08-19 DIAGNOSIS — Z8673 Personal history of transient ischemic attack (TIA), and cerebral infarction without residual deficits: Secondary | ICD-10-CM

## 2018-08-19 DIAGNOSIS — I2510 Atherosclerotic heart disease of native coronary artery without angina pectoris: Secondary | ICD-10-CM | POA: Diagnosis present

## 2018-08-19 DIAGNOSIS — Z880 Allergy status to penicillin: Secondary | ICD-10-CM

## 2018-08-19 DIAGNOSIS — I420 Dilated cardiomyopathy: Secondary | ICD-10-CM | POA: Diagnosis present

## 2018-08-19 DIAGNOSIS — I442 Atrioventricular block, complete: Secondary | ICD-10-CM | POA: Diagnosis present

## 2018-08-19 DIAGNOSIS — W010XXA Fall on same level from slipping, tripping and stumbling without subsequent striking against object, initial encounter: Secondary | ICD-10-CM | POA: Diagnosis present

## 2018-08-19 DIAGNOSIS — E1122 Type 2 diabetes mellitus with diabetic chronic kidney disease: Secondary | ICD-10-CM | POA: Diagnosis present

## 2018-08-19 DIAGNOSIS — R296 Repeated falls: Secondary | ICD-10-CM | POA: Diagnosis not present

## 2018-08-19 DIAGNOSIS — E785 Hyperlipidemia, unspecified: Secondary | ICD-10-CM | POA: Diagnosis present

## 2018-08-19 DIAGNOSIS — Z9181 History of falling: Secondary | ICD-10-CM | POA: Diagnosis not present

## 2018-08-19 DIAGNOSIS — Z96641 Presence of right artificial hip joint: Secondary | ICD-10-CM | POA: Diagnosis not present

## 2018-08-19 DIAGNOSIS — Z7982 Long term (current) use of aspirin: Secondary | ICD-10-CM

## 2018-08-19 DIAGNOSIS — M25552 Pain in left hip: Secondary | ICD-10-CM | POA: Diagnosis present

## 2018-08-19 DIAGNOSIS — K5900 Constipation, unspecified: Secondary | ICD-10-CM | POA: Diagnosis present

## 2018-08-19 DIAGNOSIS — Z885 Allergy status to narcotic agent status: Secondary | ICD-10-CM

## 2018-08-19 DIAGNOSIS — S7222XA Displaced subtrochanteric fracture of left femur, initial encounter for closed fracture: Secondary | ICD-10-CM | POA: Diagnosis present

## 2018-08-19 DIAGNOSIS — Z7984 Long term (current) use of oral hypoglycemic drugs: Secondary | ICD-10-CM

## 2018-08-19 DIAGNOSIS — I5042 Chronic combined systolic (congestive) and diastolic (congestive) heart failure: Secondary | ICD-10-CM | POA: Diagnosis present

## 2018-08-19 DIAGNOSIS — I130 Hypertensive heart and chronic kidney disease with heart failure and stage 1 through stage 4 chronic kidney disease, or unspecified chronic kidney disease: Secondary | ICD-10-CM | POA: Diagnosis present

## 2018-08-19 DIAGNOSIS — I480 Paroxysmal atrial fibrillation: Secondary | ICD-10-CM | POA: Diagnosis present

## 2018-08-19 DIAGNOSIS — I272 Pulmonary hypertension, unspecified: Secondary | ICD-10-CM | POA: Diagnosis present

## 2018-08-19 DIAGNOSIS — D539 Nutritional anemia, unspecified: Secondary | ICD-10-CM | POA: Diagnosis present

## 2018-08-19 DIAGNOSIS — W19XXXA Unspecified fall, initial encounter: Secondary | ICD-10-CM | POA: Diagnosis present

## 2018-08-19 DIAGNOSIS — E1129 Type 2 diabetes mellitus with other diabetic kidney complication: Secondary | ICD-10-CM | POA: Diagnosis present

## 2018-08-19 DIAGNOSIS — N183 Chronic kidney disease, stage 3 unspecified: Secondary | ICD-10-CM | POA: Diagnosis present

## 2018-08-19 DIAGNOSIS — I5022 Chronic systolic (congestive) heart failure: Secondary | ICD-10-CM | POA: Diagnosis not present

## 2018-08-19 DIAGNOSIS — N3281 Overactive bladder: Secondary | ICD-10-CM | POA: Diagnosis present

## 2018-08-19 DIAGNOSIS — Z95810 Presence of automatic (implantable) cardiac defibrillator: Secondary | ICD-10-CM

## 2018-08-19 DIAGNOSIS — M810 Age-related osteoporosis without current pathological fracture: Secondary | ICD-10-CM | POA: Diagnosis present

## 2018-08-19 DIAGNOSIS — M6282 Rhabdomyolysis: Secondary | ICD-10-CM | POA: Diagnosis present

## 2018-08-19 DIAGNOSIS — Y92009 Unspecified place in unspecified non-institutional (private) residence as the place of occurrence of the external cause: Secondary | ICD-10-CM

## 2018-08-19 DIAGNOSIS — Z951 Presence of aortocoronary bypass graft: Secondary | ICD-10-CM

## 2018-08-19 DIAGNOSIS — Z888 Allergy status to other drugs, medicaments and biological substances status: Secondary | ICD-10-CM

## 2018-08-19 DIAGNOSIS — Z91048 Other nonmedicinal substance allergy status: Secondary | ICD-10-CM

## 2018-08-19 DIAGNOSIS — Z419 Encounter for procedure for purposes other than remedying health state, unspecified: Secondary | ICD-10-CM

## 2018-08-19 DIAGNOSIS — Z952 Presence of prosthetic heart valve: Secondary | ICD-10-CM

## 2018-08-19 DIAGNOSIS — I10 Essential (primary) hypertension: Secondary | ICD-10-CM | POA: Diagnosis not present

## 2018-08-19 DIAGNOSIS — Z66 Do not resuscitate: Secondary | ICD-10-CM | POA: Diagnosis not present

## 2018-08-19 DIAGNOSIS — S72002A Fracture of unspecified part of neck of left femur, initial encounter for closed fracture: Secondary | ICD-10-CM | POA: Diagnosis not present

## 2018-08-19 DIAGNOSIS — Z01810 Encounter for preprocedural cardiovascular examination: Secondary | ICD-10-CM | POA: Diagnosis not present

## 2018-08-19 DIAGNOSIS — D62 Acute posthemorrhagic anemia: Secondary | ICD-10-CM | POA: Diagnosis not present

## 2018-08-19 DIAGNOSIS — Z882 Allergy status to sulfonamides status: Secondary | ICD-10-CM

## 2018-08-19 DIAGNOSIS — S72352A Displaced comminuted fracture of shaft of left femur, initial encounter for closed fracture: Secondary | ICD-10-CM | POA: Diagnosis present

## 2018-08-19 DIAGNOSIS — Z8249 Family history of ischemic heart disease and other diseases of the circulatory system: Secondary | ICD-10-CM

## 2018-08-19 LAB — GLUCOSE, CAPILLARY: Glucose-Capillary: 254 mg/dL — ABNORMAL HIGH (ref 70–99)

## 2018-08-19 LAB — TYPE AND SCREEN
ABO/RH(D): O POS
ANTIBODY SCREEN: NEGATIVE

## 2018-08-19 LAB — CBC WITH DIFFERENTIAL/PLATELET
Basophils Absolute: 0 10*3/uL (ref 0.0–0.1)
Basophils Relative: 0 %
Eosinophils Absolute: 0.2 10*3/uL (ref 0.0–0.7)
Eosinophils Relative: 2 %
HCT: 29.1 % — ABNORMAL LOW (ref 36.0–46.0)
Hemoglobin: 9.4 g/dL — ABNORMAL LOW (ref 12.0–15.0)
Lymphocytes Relative: 10 %
Lymphs Abs: 1 10*3/uL (ref 0.7–4.0)
MCH: 33 pg (ref 26.0–34.0)
MCHC: 32.3 g/dL (ref 30.0–36.0)
MCV: 102.1 fL — ABNORMAL HIGH (ref 78.0–100.0)
Monocytes Absolute: 1 10*3/uL (ref 0.1–1.0)
Monocytes Relative: 10 %
Neutro Abs: 7.8 10*3/uL — ABNORMAL HIGH (ref 1.7–7.7)
Neutrophils Relative %: 78 %
Platelets: 170 10*3/uL (ref 150–400)
RBC: 2.85 MIL/uL — ABNORMAL LOW (ref 3.87–5.11)
RDW: 12.4 % (ref 11.5–15.5)
WBC: 10 10*3/uL (ref 4.0–10.5)

## 2018-08-19 LAB — COMPREHENSIVE METABOLIC PANEL
ALT: 24 U/L (ref 0–44)
AST: 57 U/L — ABNORMAL HIGH (ref 15–41)
Albumin: 3.2 g/dL — ABNORMAL LOW (ref 3.5–5.0)
Alkaline Phosphatase: 63 U/L (ref 38–126)
Anion gap: 8 (ref 5–15)
BUN: 27 mg/dL — ABNORMAL HIGH (ref 8–23)
CO2: 23 mmol/L (ref 22–32)
Calcium: 9.4 mg/dL (ref 8.9–10.3)
Chloride: 110 mmol/L (ref 98–111)
Creatinine, Ser: 1.33 mg/dL — ABNORMAL HIGH (ref 0.44–1.00)
GFR calc Af Amer: 40 mL/min — ABNORMAL LOW (ref >60)
GFR calc non Af Amer: 35 mL/min — ABNORMAL LOW (ref >60)
Glucose, Bld: 230 mg/dL — ABNORMAL HIGH (ref 70–99)
Potassium: 4.4 mmol/L (ref 3.5–5.1)
Sodium: 141 mmol/L (ref 135–145)
Total Bilirubin: 1.3 mg/dL — ABNORMAL HIGH (ref 0.3–1.2)
Total Protein: 5.7 g/dL — ABNORMAL LOW (ref 6.5–8.1)

## 2018-08-19 LAB — ABO/RH: ABO/RH(D): O POS

## 2018-08-19 LAB — CK: Total CK: 1555 U/L — ABNORMAL HIGH (ref 38–234)

## 2018-08-19 LAB — CBG MONITORING, ED: GLUCOSE-CAPILLARY: 262 mg/dL — AB (ref 70–99)

## 2018-08-19 MED ORDER — OXYMETAZOLINE HCL 0.05 % NA SOLN
1.0000 | Freq: Every day | NASAL | Status: DC | PRN
  Filled 2018-08-19: qty 15

## 2018-08-19 MED ORDER — ZOLPIDEM TARTRATE 5 MG PO TABS
5.0000 mg | ORAL_TABLET | Freq: Every evening | ORAL | Status: DC | PRN
  Administered 2018-08-20: 5 mg via ORAL
  Filled 2018-08-19 (×2): qty 1

## 2018-08-19 MED ORDER — HYDRALAZINE HCL 20 MG/ML IJ SOLN: 5.0000 mg | INTRAMUSCULAR | Status: DC | PRN

## 2018-08-19 MED ORDER — SODIUM CHLORIDE 0.9 % IV SOLN
INTRAVENOUS | Status: DC
  Administered 2018-08-19 (×2): via INTRAVENOUS

## 2018-08-19 MED ORDER — MORPHINE SULFATE (PF) 2 MG/ML IV SOLN
2.0000 mg | Freq: Once | INTRAVENOUS | Status: AC
  Administered 2018-08-19: 2 mg via INTRAVENOUS
  Filled 2018-08-19: qty 1

## 2018-08-19 MED ORDER — ACETAMINOPHEN 325 MG PO TABS: 650.0000 mg | ORAL_TABLET | Freq: Four times a day (QID) | ORAL | Status: DC | PRN

## 2018-08-19 MED ORDER — INSULIN ASPART 100 UNIT/ML ~~LOC~~ SOLN
0.0000 [IU] | Freq: Every day | SUBCUTANEOUS | Status: DC
  Administered 2018-08-19 – 2018-08-20 (×2): 3 [IU] via SUBCUTANEOUS
  Administered 2018-08-22: 2 [IU] via SUBCUTANEOUS
  Administered 2018-08-23: 3 [IU] via SUBCUTANEOUS

## 2018-08-19 MED ORDER — VITAMIN D 1000 UNITS PO TABS
2000.0000 [IU] | ORAL_TABLET | Freq: Every day | ORAL | Status: DC
  Administered 2018-08-20 – 2018-08-25 (×6): 2000 [IU] via ORAL
  Filled 2018-08-19 (×6): qty 2

## 2018-08-19 MED ORDER — POLYETHYLENE GLYCOL 3350 17 G PO PACK: 17.0000 g | PACK | Freq: Every day | ORAL | Status: DC | PRN

## 2018-08-19 MED ORDER — ONDANSETRON HCL 4 MG/2ML IJ SOLN: 4.0000 mg | Freq: Three times a day (TID) | INTRAMUSCULAR | Status: DC | PRN

## 2018-08-19 MED ORDER — OMEGA-3 FATTY ACIDS 1000 MG PO CAPS: 1.0000 g | ORAL_CAPSULE | Freq: Two times a day (BID) | ORAL | Status: DC

## 2018-08-19 MED ORDER — ADULT MULTIVITAMIN W/MINERALS CH
1.0000 | ORAL_TABLET | Freq: Every day | ORAL | Status: DC
  Administered 2018-08-20 – 2018-08-25 (×6): 1 via ORAL
  Filled 2018-08-19 (×6): qty 1

## 2018-08-19 MED ORDER — MORPHINE SULFATE (PF) 2 MG/ML IV SOLN
INTRAVENOUS | Status: AC
  Filled 2018-08-19: qty 1

## 2018-08-19 MED ORDER — SACUBITRIL-VALSARTAN 49-51 MG PO TABS
1.0000 | ORAL_TABLET | Freq: Two times a day (BID) | ORAL | Status: DC
  Administered 2018-08-20 – 2018-08-25 (×9): 1 via ORAL
  Filled 2018-08-19 (×13): qty 1

## 2018-08-19 MED ORDER — OXYBUTYNIN CHLORIDE 5 MG PO TABS
5.0000 mg | ORAL_TABLET | Freq: Two times a day (BID) | ORAL | Status: DC
  Administered 2018-08-19 – 2018-08-25 (×12): 5 mg via ORAL
  Filled 2018-08-19 (×14): qty 1

## 2018-08-19 MED ORDER — VITAMIN B-12 1000 MCG PO TABS
1000.0000 ug | ORAL_TABLET | Freq: Every day | ORAL | Status: DC
  Administered 2018-08-20 – 2018-08-25 (×6): 1000 ug via ORAL
  Filled 2018-08-19 (×6): qty 1

## 2018-08-19 MED ORDER — CARVEDILOL 12.5 MG PO TABS
12.5000 mg | ORAL_TABLET | Freq: Two times a day (BID) | ORAL | Status: DC
  Administered 2018-08-19 – 2018-08-25 (×10): 12.5 mg via ORAL
  Filled 2018-08-19 (×12): qty 1

## 2018-08-19 MED ORDER — OMEGA-3-ACID ETHYL ESTERS 1 G PO CAPS
1.0000 g | ORAL_CAPSULE | Freq: Two times a day (BID) | ORAL | Status: DC
  Administered 2018-08-20 – 2018-08-25 (×10): 1 g via ORAL
  Filled 2018-08-19 (×11): qty 1

## 2018-08-19 MED ORDER — INSULIN ASPART 100 UNIT/ML ~~LOC~~ SOLN
0.0000 [IU] | Freq: Three times a day (TID) | SUBCUTANEOUS | Status: DC
  Administered 2018-08-20: 3 [IU] via SUBCUTANEOUS
  Administered 2018-08-20: 2 [IU] via SUBCUTANEOUS
  Administered 2018-08-21: 7 [IU] via SUBCUTANEOUS
  Administered 2018-08-21: 3 [IU] via SUBCUTANEOUS
  Administered 2018-08-21: 2 [IU] via SUBCUTANEOUS
  Administered 2018-08-22: 5 [IU] via SUBCUTANEOUS
  Administered 2018-08-22: 1 [IU] via SUBCUTANEOUS
  Administered 2018-08-22: 2 [IU] via SUBCUTANEOUS
  Administered 2018-08-23: 3 [IU] via SUBCUTANEOUS
  Administered 2018-08-23: 2 [IU] via SUBCUTANEOUS
  Administered 2018-08-23 – 2018-08-24 (×2): 3 [IU] via SUBCUTANEOUS
  Administered 2018-08-24 (×2): 2 [IU] via SUBCUTANEOUS
  Administered 2018-08-25: 3 [IU] via SUBCUTANEOUS
  Administered 2018-08-25: 2 [IU] via SUBCUTANEOUS

## 2018-08-19 MED ORDER — ASPIRIN EC 81 MG PO TBEC: 81.0000 mg | DELAYED_RELEASE_TABLET | Freq: Every day | ORAL | Status: DC

## 2018-08-19 MED ORDER — MORPHINE SULFATE (PF) 2 MG/ML IV SOLN: 1.0000 mg | INTRAVENOUS | Status: DC | PRN

## 2018-08-19 MED ORDER — METHOCARBAMOL 500 MG PO TABS
500.0000 mg | ORAL_TABLET | Freq: Three times a day (TID) | ORAL | Status: DC | PRN
  Administered 2018-08-19: 500 mg via ORAL
  Filled 2018-08-19: qty 1

## 2018-08-19 NOTE — ED Triage Notes (Signed)
Per EMS patient fell last night at 1830 and was found still on the floor today at approx. 1600. Pt c/o syncopal episode and pain in lower back and left hip pain. Left left is shortened. Patient on paced rhythm varying from 65-90 bpm.

## 2018-08-19 NOTE — H&P (Signed)
History and Physical    [name] OEU:[id number] DOB: 1928/02/22 DOA: 08/19/2018  Referring MD/NP/PA:   PCP: Mayra Neer, MD   Patient coming from:  The patient is coming from home.  At baseline, pt is independent for most of ADL.  Chief Complaint: fall and left hip pain  HPI: [name] is a 84 y.o. female with medical history significant of hypertension, hyperlipidemia, diabetes mellitus, stroke, CHF with EF of 30%, complete heart block, pacemaker placement, CAD, pulmonary hypertension, CKD 3, who presents with a fall and left hip pain.  The patient states that she lives at home alone. Patient states that she was trying to get ready for bed when she missed the chair and fell on the ground last night. She was laying on the ground until this afternoon. She states that she did no have LOC. She injured her left hip. She has severe left hip pain, which is constant, sharp, nonradiating.  No leg numbness. She also has some lower back pain, right shoulder pain and right knee pain. Patient does not have chest pain, shortness of breath, cough.  No fever or chills.  No nausea, vomiting, diarrhea, abdominal pain, symptoms of UTI or unilateral weakness.  No vision change or hearing loss.  No syncope.  ED Course: pt was found to have CK 1555, WBC 10.0, renal function close to baseline, temperature normal, no tachycardia, no tachypnea, oxygen saturation 98% on room air, negative chest x-ray.  CT head is negative for acute intracranial abnormalities.  CT of C-spine did not show acute bony fracture, but showed chronic degenerative disc disease.  X-ray of right knee is negative for bony fracture.  X-ray of lumbar spine no acute bony fracture.  Patient is admitted to telemetry bed as inpatient.  ED physician will consult orthopedic surgeon.  # X-ray of left hip/pelvis showed: comminuted, closed subtrochanteric fracture of the left femur with lateral displacement of the main fracture fragment by 1 shaft  width and 1/2 shaft width medial displacement of a fracture fragment that includes a portion of the lesser trochanter. A segmental fracture fragment is seen superimposed over the proximal femoral diaphysis measuring 6.1 x 3.4 cm  # X-right shoulder: No acute fracture or dislocation of the right shoulder is observed. There is moderate to severe degenerative change of the glenohumeral and AC joints with evidence of a known previous acromial fracture  and subsequent widening of the [facility] joint. If there remain strong clinical concern of an occult acute fracture, repeat shoulder CT scanning would be useful.  Review of Systems:   General: no fevers, chills, no body weight gain, has fatigue HEENT: no blurry vision, hearing changes or sore throat Respiratory: no dyspnea, coughing, wheezing CV: no chest pain, no palpitations GI: no nausea, vomiting, abdominal pain, diarrhea, constipation GU: no dysuria, burning on urination, increased urinary frequency, hematuria  Ext: no leg edema Neuro: no unilateral weakness, numbness, or tingling, no vision change or hearing loss.  Had fall. Skin: no rash, no skin tear. MSK: has pain in left hip, right shoulder, right knee and lower back. Heme: No easy bruising.  Travel history: No recent long distant travel.  Allergy:  Allergies  Allergen Reactions  . Fosamax [Alendronate] Anaphylaxis  . Horse-Derived Products Hives  . Clonidine Derivatives Other (See Comments)    Unknown reaction  . Darvocet [Propoxyphene N-Acetaminophen] Nausea And Vomiting  . Lipitor [Atorvastatin] Swelling  . Sulfa Drugs Cross Reactors Hives and Itching  . Tramadol Nausea And Vomiting  .  Vicodin [Hydrocodone-Acetaminophen] Other (See Comments)    lightheaded  . Penicillins Rash    Has patient had a PCN reaction causing immediate rash, facial/tongue/throat swelling, SOB or lightheadedness with hypotension: Yes Has patient had a PCN reaction causing severe rash involving mucus membranes  or skin necrosis: No Has patient had a PCN reaction that required hospitalization Yes Has patient had a PCN reaction occurring within the last 10 years: No If all of the above answers are "NO", then may proceed with Cephalosporin  . Septra [Sulfamethoxazole-Trimethoprim] Rash    Past Medical History:  Diagnosis Date  . Arthritis    "left wrist; back" (07/29/2013)  . Cancer [facility]) Sept. 2015   Left Hand  -  Skin Cancer  . Carotid stenosis    occluded right intracranial ICA followed by Dr. Donnetta Hutching.  1-39% left ICA stenosis by dopplers 10/2017  . Chronic combined systolic and diastolic CHF (congestive heart failure) ([place])   . Chronic kidney disease, unspecified   . Complete heart block ([place]) 07/29/2013   a. s/p MDT dual chamber PPM  . Coronary artery disease    s/p CABG  . Hyperlipidemia   . Hypertension   . Ischemic cardiomyopathy     s/p MDT single chamber ICD 2006.  EF improved and device was downgraded to dual chamber PPM 2014.  EF normalized by echo 2014 and at time of battery changeout ICD changed to PPM.  EF then decreased to 20-25% by echo 04/2016.  Seen in AHF clinic and plan was for not pursue cath given her lack of sx and advanced age. Her Delene Loll was increased and it was decided that she was too high risk for CRTP   . Osteoporosis   . Overactive bladder   . Pacemaker   . Postoperative atrial fibrillation ([place])   . Pulmonary HTN ([place])    severe by echo 04/2016 - likely Group 2 from pulmonary venous HTN secondary to LV dysfunction and MR  . Severe mitral regurgitation    s/p MV ring.  Moderate MR and mild mitral stenosis on echo 04/2016  . Stroke ([place]) 07/2005  . Type II diabetes mellitus ([place])     Past Surgical History:  Procedure Laterality Date  . APPENDECTOMY  1949  . CARDIAC DEFIBRILLATOR PLACEMENT  11/2005   by Dr Leonia Reeves (MDT) she has a [phone] fidelis lead  . CATARACT EXTRACTION W/ INTRAOCULAR LENS  IMPLANT, BILATERAL Bilateral 1980's  . CORONARY ARTERY BYPASS GRAFT   07/2005   "CABG X3" (07/29/2013)  . MITRAL VALVE REPLACEMENT  07/2005  . PERMANENT PACEMAKER INSERTION N/A 07/29/2013   upgrade of single chamber ICD to Medtronic Adapta L DR implanted with new RA and RV leads inserted  . SALPINGOOPHORECTOMY Bilateral 1976  . SHOULDER ARTHROSCOPY W/ ROTATOR CUFF REPAIR Right 1998  . TOTAL HIP ARTHROPLASTY Right 2001    Social History:  reports that she has never smoked. She has never used smokeless tobacco. She reports that she does not drink alcohol or use drugs.  Family History:  Family History  Problem Relation Age of Onset  . Cancer Mother        Theadora Rama   . Heart disease Mother        Before age 73  . Heart disease Sister        Before age 57-  CABG  . Cancer Sister        Lonia Chimera  . Diabetes Sister   . Hyperlipidemia Sister   . Heart attack Sister   .  Diabetes Daughter   . Coronary artery disease Unknown        family hx of  . Hypertension Unknown        family hx of  . Diabetes Unknown        family hx of     Prior to Admission medications   Medication Sig Start Date End Date Taking? Authorizing Provider  acetaminophen (TYLENOL) 500 MG tablet Take 500 mg by mouth daily as needed (pain).     [provider]  aspirin EC 81 MG tablet Take 81 mg by mouth every morning.    [provider]  carvedilol (COREG) 12.5 MG tablet TAKE 1 TABLET (12.5 MG TOTAL) BY MOUTH 2 (TWO) TIMES DAILY. 06/03/18   Sueanne Margarita, MD  Cholecalciferol (VITAMIN D) 2000 UNITS tablet Take 2,000 Units by mouth daily.     [provider]  ENTRESTO 49-51 MG TAKE 1 TABLET BY MOUTH TWICE A DAY 08/18/18   Larey Dresser, MD  fish oil-omega-3 fatty acids 1000 MG capsule Take 1 g by mouth 2 (two) times daily.     [provider]  glipiZIDE (GLUCOTROL XL) 10 MG 24 hr tablet Take 20 mg by mouth daily.    [provider]  lidocaine (LIDODERM) 5 % Place 1 patch onto the skin daily. Remove & Discard patch within 12 hours or as directed  by MD 08/21/17   Tegeler, Gwenyth Allegra, MD  Multiple Vitamins-Minerals (CENTRUM SILVER PO) Take 1 tablet by mouth daily.     [provider]  oxybutynin (DITROPAN) 5 MG tablet Take 1 tablet by mouth 2 (two) times daily. 05/02/15   [provider]  sacubitril-valsartan (ENTRESTO) 49-51 MG Take 1 tablet by mouth 2 (two) times daily. 07/07/18   Larey Dresser, MD  vitamin B-12 (CYANOCOBALAMIN) 1000 MCG tablet Take 1,000 mcg by mouth daily.    [provider]    Physical Exam: Vitals:   08/19/18 1945 08/19/18 2030 08/19/18 2100 08/19/18 2233  BP: (!) 144/52 (!) 154/52  (!) 134/52  Pulse:   79 66  Resp: 12 14 20 20   Temp:    98.8 F (37.1 C)  TempSrc:    Oral  SpO2:   99% 98%  Weight:      Height:       General: Not in acute distress HEENT:       Eyes: PERRL, EOMI, no scleral icterus.       ENT: No discharge from the ears and nose, no pharynx injection, no tonsillar enlargement.        Neck: No JVD, no bruit, no mass felt. Heme: No neck lymph node enlargement. Cardiac: S1/S2, RRR, No murmurs, No gallops or rubs. Respiratory: No rales, wheezing, rhonchi or rubs. GI: Soft, nondistended, nontender, no rebound pain, no organomegaly, BS present. GU: No hematuria Ext: No pitting leg edema bilaterally. 2+DP/PT pulse bilaterally. Musculoskeletal: Calling to be operated tomorrow as scheduled tomorrow to [facility] see has tenderness in left hip, left leg is shortened and externally rotated. Skin: No rashes.  Neuro: Alert, oriented X3, cranial nerves II-XII grossly intact, moves all extremities normally.  Psych: Patient is not psychotic, no suicidal or hemocidal ideation.  Labs on Admission: I have personally reviewed following labs and imaging studies  CBC: Recent Labs  Lab 08/19/18 1715  WBC 10.0  NEUTROABS 7.8*  HGB 9.4*  HCT 29.1*  MCV 102.1*  PLT 166   Basic Metabolic Panel: Recent Labs  Lab 08/19/18 1715  NA 141  K 4.4  CL 110  CO2 23  GLUCOSE 230*   BUN 27*  CREATININE 1.33*  CALCIUM 9.4   GFR: Estimated Creatinine Clearance: 23.5 mL/min (A) (by C-G formula based on SCr of 1.33 mg/dL (H)). Liver Function Tests: Recent Labs  Lab 08/19/18 1715  AST 57*  ALT 24  ALKPHOS 63  BILITOT 1.3*  PROT 5.7*  ALBUMIN 3.2*   No results for input(s): LIPASE, AMYLASE in the last 168 hours. No results for input(s): AMMONIA in the last 168 hours. Coagulation Profile: No results for input(s): INR, PROTIME in the last 168 hours. Cardiac Enzymes: Recent Labs  Lab 08/19/18 1715  CKTOTAL 1,555*   BNP (last 3 results) No results for input(s): PROBNP in the last 8760 hours. HbA1C: No results for input(s): HGBA1C in the last 72 hours. CBG: Recent Labs  Lab 08/19/18 2135  GLUCAP 262*   Lipid Profile: No results for input(s): CHOL, HDL, LDLCALC, TRIG, CHOLHDL, LDLDIRECT in the last 72 hours. Thyroid Function Tests: No results for input(s): TSH, T4TOTAL, FREET4, T3FREE, THYROIDAB in the last 72 hours. Anemia Panel: No results for input(s): VITAMINB12, FOLATE, FERRITIN, TIBC, IRON, RETICCTPCT in the last 72 hours. Urine analysis:    Component Value Date/Time   COLORURINE YELLOW 01/18/2014 0846   APPEARANCEUR CLEAR 01/18/2014 0846   LABSPEC 1.008 01/18/2014 0846   PHURINE 5.5 01/18/2014 0846   GLUCOSEU NEGATIVE 01/18/2014 0846   HGBUR NEGATIVE 01/18/2014 0846   BILIRUBINUR NEGATIVE 01/18/2014 0846   KETONESUR NEGATIVE 01/18/2014 0846   PROTEINUR NEGATIVE 01/18/2014 0846   UROBILINOGEN 0.2 01/18/2014 0846   NITRITE NEGATIVE 01/18/2014 0846   LEUKOCYTESUR NEGATIVE 01/18/2014 0846   Sepsis Labs: @LABRCNTIP (procalcitonin:4,lacticidven:4) )No results found for this or any previous visit (from the past 240 hour(s)).   Radiological Exams on Admission: Dg Chest 1 View  Result Date: 08/19/2018 CLINICAL DATA:  Patient fell last evening at 1839 hours and was found on the floor today. Syncopal episode. EXAM: CHEST  1 VIEW COMPARISON:   11/17/2015 FINDINGS: Heart and mediastinal contours are stable and within normal limits with post CABG change and mitral valvular replacement. Mild aortic atherosclerosis without aneurysm. ICD device projects over the left axilla with leads in the right atrium and right ventricle. Lungs are clear without acute pulmonary consolidations. Osteoarthritis of the right glenohumeral joint with high-riding humeral heads bilaterally. Remodeled appearance of the undersurface a the distal right clavicle. Findings likely reflect chronic rotator cuff tear. Differential possibility may include rheumatoid arthritis. IMPRESSION: No active cardiopulmonary disease. Aortic atherosclerosis. Electronically Signed   By: Ashley Royalty M.D.   On: 08/19/2018 18:11   Ct Head Wo Contrast  Result Date: 08/19/2018 CLINICAL DATA:  Patient slipped and fell and was found on the floor. EXAM: CT HEAD WITHOUT CONTRAST CT CERVICAL SPINE WITHOUT CONTRAST TECHNIQUE: Multidetector CT imaging of the head and cervical spine was performed following the standard protocol without intravenous contrast. Multiplanar CT image reconstructions of the cervical spine were also generated. COMPARISON:  11/18/2017 and 11/16/2015 FINDINGS: CT HEAD FINDINGS BRAIN: There is mild to moderate sulcal and ventricular prominence consistent with superficial and central atrophy. No intraparenchymal hemorrhage, mass effect nor midline shift. Periventricular and subcortical white matter hypodensities consistent with mild-to-moderate chronic small vessel ischemic disease are identified. No acute large vascular territory infarcts. No abnormal extra-axial fluid collections. Basal cisterns are not effaced and midline. Intact brainstem and cerebellum. VASCULAR: Moderate calcific atherosclerosis of the carotid siphons. SKULL: No skull fracture. No significant scalp soft  tissue swelling. SINUSES/ORBITS: The mastoid air-cells are clear. The included paranasal sinuses are well-aerated.The  included ocular globes and orbital contents are non-suspicious. OTHER: None. CT CERVICAL SPINE FINDINGS Alignment: There is maintained cervical lordosis Skull base and vertebrae: Osteoarthritis of the atlantodental interval with soft tissue pannus posterior to the odontoid process and subcortical erosive change is redemonstrated. Soft tissues and spinal canal: No prevertebral fluid or swelling. No visible canal hematoma. Disc levels: Minimal anterolisthesis grade 1 of C3 on C4. Moderate disc flattening at C5-6 and C6-7. Bilateral uncovertebral joint osteoarthritis is seen at C3-4, C4-5 to a greater extent C5-6 and C6-7. Multilevel degenerative facet arthropathy is seen. Small central disc protrusion C2-3. Central to right central disc bulge at C3-4, mild broad-based disc bulge C4-5, and disc-osteophyte complex at C5-6. Upper chest: Negative. Other: None IMPRESSION: Head CT: 1. Atrophy with chronic small vessel ischemia. No acute intracranial abnormality. 2. Cervical spondylosis without acute cervical spine fracture. Multilevel degenerative disc and facet arthropathy. Electronically Signed   By: Ashley Royalty M.D.   On: 08/19/2018 18:30   Ct Cervical Spine Wo Contrast  Result Date: 08/19/2018 CLINICAL DATA:  Patient slipped and fell and was found on the floor. EXAM: CT HEAD WITHOUT CONTRAST CT CERVICAL SPINE WITHOUT CONTRAST TECHNIQUE: Multidetector CT imaging of the head and cervical spine was performed following the standard protocol without intravenous contrast. Multiplanar CT image reconstructions of the cervical spine were also generated. COMPARISON:  11/18/2017 and 11/16/2015 FINDINGS: CT HEAD FINDINGS BRAIN: There is mild to moderate sulcal and ventricular prominence consistent with superficial and central atrophy. No intraparenchymal hemorrhage, mass effect nor midline shift. Periventricular and subcortical white matter hypodensities consistent with mild-to-moderate chronic small vessel ischemic disease are  identified. No acute large vascular territory infarcts. No abnormal extra-axial fluid collections. Basal cisterns are not effaced and midline. Intact brainstem and cerebellum. VASCULAR: Moderate calcific atherosclerosis of the carotid siphons. SKULL: No skull fracture. No significant scalp soft tissue swelling. SINUSES/ORBITS: The mastoid air-cells are clear. The included paranasal sinuses are well-aerated.The included ocular globes and orbital contents are non-suspicious. OTHER: None. CT CERVICAL SPINE FINDINGS Alignment: There is maintained cervical lordosis Skull base and vertebrae: Osteoarthritis of the atlantodental interval with soft tissue pannus posterior to the odontoid process and subcortical erosive change is redemonstrated. Soft tissues and spinal canal: No prevertebral fluid or swelling. No visible canal hematoma. Disc levels: Minimal anterolisthesis grade 1 of C3 on C4. Moderate disc flattening at C5-6 and C6-7. Bilateral uncovertebral joint osteoarthritis is seen at C3-4, C4-5 to a greater extent C5-6 and C6-7. Multilevel degenerative facet arthropathy is seen. Small central disc protrusion C2-3. Central to right central disc bulge at C3-4, mild broad-based disc bulge C4-5, and disc-osteophyte complex at C5-6. Upper chest: Negative. Other: None IMPRESSION: Head CT: 1. Atrophy with chronic small vessel ischemia. No acute intracranial abnormality. 2. Cervical spondylosis without acute cervical spine fracture. Multilevel degenerative disc and facet arthropathy. Electronically Signed   By: Ashley Royalty M.D.   On: 08/19/2018 18:30   Dg Hip Unilat With Pelvis 2-3 Views Left  Result Date: 08/19/2018 CLINICAL DATA:  Patient fell last evening was found on the floor today. Foreshortened left leg. EXAM: DG HIP (WITH OR WITHOUT PELVIS) 2-3V LEFT COMPARISON:  None. FINDINGS: AP view the pelvis as well as AP and frog-leg views of the left hip were provided. There is a comminuted, closed subtrochanteric fracture  of the left femur with lateral displacement of the main fracture fragment by 1 shaft width and  1/2 shaft width medial displacement of a fracture fragment that includes a portion of the lesser trochanter. A segmental fracture fragment is seen superimposed over the proximal femoral diaphysis measuring 6.1 x 3.4 cm. No acute fracture of the right hip with cemented right total hip arthroplasty. The bony pelvis appears intact. IMPRESSION: There is a comminuted, closed subtrochanteric fracture of the left femur with lateral displacement of the main fracture fragment by 1 shaft width and 1/2 shaft width medial displacement of a fracture fragment that includes a portion of the lesser trochanter. A segmental fracture fragment is seen superimposed over the proximal femoral diaphysis measuring 6.1 x 3.4 cm Intact right total hip arthroplasty. Electronically Signed   By: Ashley Royalty M.D.   On: 08/19/2018 18:19     EKG: Independently reviewed.  ? Junctional rhythm, QTC 496, anteroseptal infarction pattern, poor R wave progression, ST depression in V5-V6.   Assessment/Plan Principal Problem:   Closed left hip fracture (HCC) Active Problems:   Hypertension   CKD (chronic kidney disease), stage III (HCC)   Coronary artery disease   Type II diabetes mellitus with renal manifestations (HCC)   Complete heart block (HCC)   Chronic combined systolic and diastolic CHF (congestive heart failure) (HCC)   Fall   Macrocytic anemia   Closed left hip fracture ([place]): As evidenced by x-ray. Patient has moderate pain now. No neurovascular compromise. EDP spoke with Dr. Lynann Bologna who states that he will pass the information along to Dr. Mayer Camel who is pt's ortho.  - will admit to tele bed - Pain control: morphine prn  - When necessary Zofran for nausea - Robaxin for muscle spasm - type and cross - INR/PTT - PT/OT when able to (not ordered now) -keep pt NPO after MN in case pt needs surgery -card consult was requested  for presurgical clearance via Epic  - consult SW and CM for possible placement  Chronic combined systolic and diastolic CHF: 2D echo on 06/03/3328 showed EF of 30-35% with grade 1 diastolic dysfunction.  Patient does not have leg edema or JVD.  No respiratory distress.  Chest x-ray has no pulmonary edema.  CHF is compensated.  Patient states that he is taking as needed Lasix 20 mg. -Check BNP -Continue Entresto  Rhabdomyolysis: CK 1555: -NS at 50 cc/h (cannot give aggressive IV fluid due to EF of 30%). -repeat CK in AM  HTN:  -Continue home medications: Coreg, Entresto, -IV hydralazine prn  CKD (chronic kidney disease), stage III ([place]): Stable.  Baseline creatinine 1.31 on 02/24/2018. History of follow-up renal function by BMP  Coronary artery disease: No CP. -continue ASA and Coreg  Type II diabetes mellitus with renal manifestations ([place]): Last A1c not on record. Patient is taking glipizide at home.  Blood sugar 230 -SSI -check A1c  Fall: Patient seems to have had a mechanical fall.  CT of her head and neck has no acute bony abnormalities. -PT/OT when able to.  Macrocytic anemia: Hemoglobin 9.4.  MCV 102.1 -Check anemia panel   DVT ppx: SCD Code Status: DNR (I discussed with patient in the presence of her daughter, and explained the meaning of CODE STATUS. Patient wants to be DNR)  Family Communication: Yes, patient's  daughter at bed side Disposition Plan:  Anticipate discharge back to rehab facility. Consults called:  EDP will consult ortho Admission status:  Inpatient/tele      Date of Service 08/19/2018    Ivor Costa [facility] Pager [phone]  If 7PM-7AM, please contact night-coverage  [email or web address] Password [facility] 08/19/2018, 10:37 PM

## 2018-08-19 NOTE — ED Provider Notes (Signed)
Medical screening examination/treatment/procedure(s) were conducted as a shared visit with non-physician practitioner(s) and myself.  I personally evaluated the patient during the encounter.  None 84 year old female presents after mechanical fall last night sustaining a hip fracture.  Also has evidence of dehydration.  Even IV fluids here and will consult orthopedics and will admit to the medicine service.   Lacretia Leigh, MD 08/19/18 2024

## 2018-08-19 NOTE — ED Notes (Signed)
Patient transported to CT and xray 

## 2018-08-19 NOTE — ED Provider Notes (Signed)
[facility] EMERGENCY DEPARTMENT Provider Note   CSN: [id number] Arrival date & time: 08/19/18  1649     History   Chief Complaint Chief Complaint  Patient presents with  . Fall  . Hip Pain    HPI [name] is a 84 y.o. female.  HPI Patient presents to the emergency department after falling last night at home and laying on the ground until this afternoon.  Patient states that she was trying to get ready for bed when she missed the chair and fell on the ground.  The patient states she is having left hip pain.  Patient states she has no other injuries.  Patient states she recalls the entire event.  The patient states that she does live at home alone.  Patient states that she did not take any medications prior to arrival.  The patient denies chest pain, shortness of breath, headache,blurred vision, neck pain, fever, cough, weakness, numbness, dizziness, anorexia, edema, abdominal pain, nausea, vomiting, diarrhea, rash, back pain, dysuria, hematemesis, bloody stool, near syncope, or syncope. Past Medical History:  Diagnosis Date  . Arthritis    "left wrist; back" (07/29/2013)  . Cancer [facility]) Sept. 2015   Left Hand  -  Skin Cancer  . Carotid stenosis    occluded right intracranial ICA followed by Dr. Donnetta Hutching.  1-39% left ICA stenosis by dopplers 10/2017  . Chronic combined systolic and diastolic CHF (congestive heart failure) ([place])   . Chronic kidney disease, unspecified   . Complete heart block ([place]) 07/29/2013   a. s/p MDT dual chamber PPM  . Coronary artery disease    s/p CABG  . Hyperlipidemia   . Hypertension   . Ischemic cardiomyopathy     s/p MDT single chamber ICD 2006.  EF improved and device was downgraded to dual chamber PPM 2014.  EF normalized by echo 2014 and at time of battery changeout ICD changed to PPM.  EF then decreased to 20-25% by echo 04/2016.  Seen in AHF clinic and plan was for not pursue cath given her lack of sx and advanced age. Her  Delene Loll was increased and it was decided that she was too high risk for CRTP   . Osteoporosis   . Overactive bladder   . Pacemaker   . Postoperative atrial fibrillation ([place])   . Pulmonary HTN ([place])    severe by echo 04/2016 - likely Group 2 from pulmonary venous HTN secondary to LV dysfunction and MR  . Severe mitral regurgitation    s/p MV ring.  Moderate MR and mild mitral stenosis on echo 04/2016  . Stroke ([place]) 07/2005  . Type II diabetes mellitus [facility])     Patient Active Problem List   Diagnosis Date Noted  . Closed left hip fracture ([place]) 08/19/2018  . DCM (dilated cardiomyopathy) ([place]) 02/20/2018  . Heart murmur 05/14/2016  . Fall 11/17/2015  . Scalp laceration 11/17/2015  . Facial contusion 11/17/2015  . Rib fracture 11/16/2015  . Paresthesia of both hands 01/18/2014  . Renal insufficiency 01/18/2014  . Lower extremity weakness 01/18/2014  . Coronary artery disease   . Type II diabetes mellitus with renal manifestations ([place])   . Hyperlipidemia   . Carotid stenosis   . Complete heart block ([place])   . Severe mitral regurgitation   . Chronic combined systolic and diastolic CHF (congestive heart failure) ([place])   . PAF (paroxysmal atrial fibrillation) ([place]) 10/29/2013  . 9678 lead 07/21/2013  . CKD (chronic kidney disease), stage  III ([address])   . Hypertension 04/30/2011    Past Surgical History:  Procedure Laterality Date  . APPENDECTOMY  1949  . CARDIAC DEFIBRILLATOR PLACEMENT  11/2005   by Dr Leonia Reeves (MDT) she has a [phone] fidelis lead  . CATARACT EXTRACTION W/ INTRAOCULAR LENS  IMPLANT, BILATERAL Bilateral 1980's  . CORONARY ARTERY BYPASS GRAFT  07/2005   "CABG X3" (07/29/2013)  . MITRAL VALVE REPLACEMENT  07/2005  . PERMANENT PACEMAKER INSERTION N/A 07/29/2013   upgrade of single chamber ICD to Medtronic Adapta L DR implanted with new RA and RV leads inserted  . SALPINGOOPHORECTOMY Bilateral 1976  . SHOULDER ARTHROSCOPY W/ ROTATOR CUFF REPAIR Right 1998  . TOTAL HIP  ARTHROPLASTY Right 2001     OB History   None      Home Medications    Prior to Admission medications   Medication Sig Start Date End Date Taking? Authorizing Provider  acetaminophen (TYLENOL) 500 MG tablet Take 500 mg by mouth daily as needed (pain).     [provider]  aspirin EC 81 MG tablet Take 81 mg by mouth every morning.    [provider]  carvedilol (COREG) 12.5 MG tablet TAKE 1 TABLET (12.5 MG TOTAL) BY MOUTH 2 (TWO) TIMES DAILY. 06/03/18   Sueanne Margarita, MD  Cholecalciferol (VITAMIN D) 2000 UNITS tablet Take 2,000 Units by mouth daily.     [provider]  ENTRESTO 49-51 MG TAKE 1 TABLET BY MOUTH TWICE A DAY 08/18/18   Larey Dresser, MD  fish oil-omega-3 fatty acids 1000 MG capsule Take 1 g by mouth 2 (two) times daily.     [provider]  glipiZIDE (GLUCOTROL XL) 10 MG 24 hr tablet Take 20 mg by mouth daily.    [provider]  lidocaine (LIDODERM) 5 % Place 1 patch onto the skin daily. Remove & Discard patch within 12 hours or as directed by MD 08/21/17   Tegeler, Gwenyth Allegra, MD  Multiple Vitamins-Minerals (CENTRUM SILVER PO) Take 1 tablet by mouth daily.     [provider]  oxybutynin (DITROPAN) 5 MG tablet Take 1 tablet by mouth 2 (two) times daily. 05/02/15   [provider]  sacubitril-valsartan (ENTRESTO) 49-51 MG Take 1 tablet by mouth 2 (two) times daily. 07/07/18   Larey Dresser, MD  vitamin B-12 (CYANOCOBALAMIN) 1000 MCG tablet Take 1,000 mcg by mouth daily.    [provider]    Family History Family History  Problem Relation Age of Onset  . Cancer Mother        Theadora Rama   . Heart disease Mother        Before age 59  . Heart disease Sister        Before age 24-  CABG  . Cancer Sister        Lonia Chimera  . Diabetes Sister   . Hyperlipidemia Sister   . Heart attack Sister   . Diabetes Daughter   . Coronary artery disease Unknown        family hx of  . Hypertension Unknown         family hx of  . Diabetes Unknown        family hx of    Social History Social History   Tobacco Use  . Smoking status: Never Smoker  . Smokeless tobacco: Never Used  Substance Use Topics  . Alcohol use: No    Alcohol/week: 0.0 standard drinks  . Drug use: No  Allergies   Fosamax [alendronate]; Horse-derived products; Alendronate sodium; Clonidine derivatives; Darvocet [propoxyphene n-acetaminophen]; Lipitor [atorvastatin]; Sulfa drugs cross reactors; Tramadol; Vicodin [hydrocodone-acetaminophen]; Penicillins; and Septra [sulfamethoxazole-trimethoprim]   Review of Systems Review of Systems All other systems negative except as documented in the HPI. All pertinent positives and negatives as reviewed in the HPI.  Physical Exam Updated Vital Signs BP (!) 144/52   Pulse 68   Temp 98.6 F (37 C) (Oral)   Resp 12   Ht 5' (1.524 m)   Wt 59 kg   SpO2 99%   BMI 25.39 kg/m   Physical Exam  Constitutional: She is oriented to person, place, and time. She appears well-developed and well-nourished. No distress.  HENT:  Head: Normocephalic and atraumatic.  Mouth/Throat: Oropharynx is clear and moist.  Eyes: Pupils are equal, round, and reactive to light.  Neck: Normal range of motion. Neck supple.  Cardiovascular: Normal rate, regular rhythm and normal heart sounds. Exam reveals no gallop and no friction rub.  No murmur heard. Pulmonary/Chest: Effort normal and breath sounds normal. No respiratory distress. She has no wheezes.  Abdominal: Soft. Bowel sounds are normal. She exhibits no distension. There is no tenderness.  Musculoskeletal:       Left hip: She exhibits decreased range of motion, tenderness, bony tenderness and deformity. She exhibits no crepitus.  Neurological: She is alert and oriented to person, place, and time. She exhibits normal muscle tone. Coordination normal.  Skin: Skin is warm and dry. Capillary refill takes less than 2 seconds. No rash noted. No  erythema.  Psychiatric: She has a normal mood and affect. Her behavior is normal.  Nursing note and vitals reviewed.    ED Treatments / Results  Labs (all labs ordered are listed, but only abnormal results are displayed) Labs Reviewed  COMPREHENSIVE METABOLIC PANEL - Abnormal; Notable for the following components:      Result Value   Glucose, Bld 230 (*)    BUN 27 (*)    Creatinine, Ser 1.33 (*)    Total Protein 5.7 (*)    Albumin 3.2 (*)    AST 57 (*)    Total Bilirubin 1.3 (*)    GFR calc non Af Amer 35 (*)    GFR calc Af Amer 40 (*)    All other components within normal limits  CBC WITH DIFFERENTIAL/PLATELET - Abnormal; Notable for the following components:   RBC 2.85 (*)    Hemoglobin 9.4 (*)    HCT 29.1 (*)    MCV 102.1 (*)    Neutro Abs 7.8 (*)    All other components within normal limits  CK - Abnormal; Notable for the following components:   Total CK 1,555 (*)    All other components within normal limits  URINALYSIS, ROUTINE W REFLEX MICROSCOPIC    EKG None  Radiology Dg Chest 1 View  Result Date: 08/19/2018 CLINICAL DATA:  Patient fell last evening at 1839 hours and was found on the floor today. Syncopal episode. EXAM: CHEST  1 VIEW COMPARISON:  11/17/2015 FINDINGS: Heart and mediastinal contours are stable and within normal limits with post CABG change and mitral valvular replacement. Mild aortic atherosclerosis without aneurysm. ICD device projects over the left axilla with leads in the right atrium and right ventricle. Lungs are clear without acute pulmonary consolidations. Osteoarthritis of the right glenohumeral joint with high-riding humeral heads bilaterally. Remodeled appearance of the undersurface a the distal right clavicle. Findings likely reflect chronic rotator cuff tear. Differential possibility may  include rheumatoid arthritis. IMPRESSION: No active cardiopulmonary disease. Aortic atherosclerosis. Electronically Signed   By: Ashley Royalty M.D.   On:  08/19/2018 18:11   Ct Head Wo Contrast  Result Date: 08/19/2018 CLINICAL DATA:  Patient slipped and fell and was found on the floor. EXAM: CT HEAD WITHOUT CONTRAST CT CERVICAL SPINE WITHOUT CONTRAST TECHNIQUE: Multidetector CT imaging of the head and cervical spine was performed following the standard protocol without intravenous contrast. Multiplanar CT image reconstructions of the cervical spine were also generated. COMPARISON:  11/18/2017 and 11/16/2015 FINDINGS: CT HEAD FINDINGS BRAIN: There is mild to moderate sulcal and ventricular prominence consistent with superficial and central atrophy. No intraparenchymal hemorrhage, mass effect nor midline shift. Periventricular and subcortical white matter hypodensities consistent with mild-to-moderate chronic small vessel ischemic disease are identified. No acute large vascular territory infarcts. No abnormal extra-axial fluid collections. Basal cisterns are not effaced and midline. Intact brainstem and cerebellum. VASCULAR: Moderate calcific atherosclerosis of the carotid siphons. SKULL: No skull fracture. No significant scalp soft tissue swelling. SINUSES/ORBITS: The mastoid air-cells are clear. The included paranasal sinuses are well-aerated.The included ocular globes and orbital contents are non-suspicious. OTHER: None. CT CERVICAL SPINE FINDINGS Alignment: There is maintained cervical lordosis Skull base and vertebrae: Osteoarthritis of the atlantodental interval with soft tissue pannus posterior to the odontoid process and subcortical erosive change is redemonstrated. Soft tissues and spinal canal: No prevertebral fluid or swelling. No visible canal hematoma. Disc levels: Minimal anterolisthesis grade 1 of C3 on C4. Moderate disc flattening at C5-6 and C6-7. Bilateral uncovertebral joint osteoarthritis is seen at C3-4, C4-5 to a greater extent C5-6 and C6-7. Multilevel degenerative facet arthropathy is seen. Small central disc protrusion C2-3. Central to  right central disc bulge at C3-4, mild broad-based disc bulge C4-5, and disc-osteophyte complex at C5-6. Upper chest: Negative. Other: None IMPRESSION: Head CT: 1. Atrophy with chronic small vessel ischemia. No acute intracranial abnormality. 2. Cervical spondylosis without acute cervical spine fracture. Multilevel degenerative disc and facet arthropathy. Electronically Signed   By: Ashley Royalty M.D.   On: 08/19/2018 18:30   Ct Cervical Spine Wo Contrast  Result Date: 08/19/2018 CLINICAL DATA:  Patient slipped and fell and was found on the floor. EXAM: CT HEAD WITHOUT CONTRAST CT CERVICAL SPINE WITHOUT CONTRAST TECHNIQUE: Multidetector CT imaging of the head and cervical spine was performed following the standard protocol without intravenous contrast. Multiplanar CT image reconstructions of the cervical spine were also generated. COMPARISON:  11/18/2017 and 11/16/2015 FINDINGS: CT HEAD FINDINGS BRAIN: There is mild to moderate sulcal and ventricular prominence consistent with superficial and central atrophy. No intraparenchymal hemorrhage, mass effect nor midline shift. Periventricular and subcortical white matter hypodensities consistent with mild-to-moderate chronic small vessel ischemic disease are identified. No acute large vascular territory infarcts. No abnormal extra-axial fluid collections. Basal cisterns are not effaced and midline. Intact brainstem and cerebellum. VASCULAR: Moderate calcific atherosclerosis of the carotid siphons. SKULL: No skull fracture. No significant scalp soft tissue swelling. SINUSES/ORBITS: The mastoid air-cells are clear. The included paranasal sinuses are well-aerated.The included ocular globes and orbital contents are non-suspicious. OTHER: None. CT CERVICAL SPINE FINDINGS Alignment: There is maintained cervical lordosis Skull base and vertebrae: Osteoarthritis of the atlantodental interval with soft tissue pannus posterior to the odontoid process and subcortical erosive  change is redemonstrated. Soft tissues and spinal canal: No prevertebral fluid or swelling. No visible canal hematoma. Disc levels: Minimal anterolisthesis grade 1 of C3 on C4. Moderate disc flattening at C5-6 and C6-7. Bilateral uncovertebral  joint osteoarthritis is seen at C3-4, C4-5 to a greater extent C5-6 and C6-7. Multilevel degenerative facet arthropathy is seen. Small central disc protrusion C2-3. Central to right central disc bulge at C3-4, mild broad-based disc bulge C4-5, and disc-osteophyte complex at C5-6. Upper chest: Negative. Other: None IMPRESSION: Head CT: 1. Atrophy with chronic small vessel ischemia. No acute intracranial abnormality. 2. Cervical spondylosis without acute cervical spine fracture. Multilevel degenerative disc and facet arthropathy. Electronically Signed   By: Ashley Royalty M.D.   On: 08/19/2018 18:30   Dg Hip Unilat With Pelvis 2-3 Views Left  Result Date: 08/19/2018 CLINICAL DATA:  Patient fell last evening was found on the floor today. Foreshortened left leg. EXAM: DG HIP (WITH OR WITHOUT PELVIS) 2-3V LEFT COMPARISON:  None. FINDINGS: AP view the pelvis as well as AP and frog-leg views of the left hip were provided. There is a comminuted, closed subtrochanteric fracture of the left femur with lateral displacement of the main fracture fragment by 1 shaft width and 1/2 shaft width medial displacement of a fracture fragment that includes a portion of the lesser trochanter. A segmental fracture fragment is seen superimposed over the proximal femoral diaphysis measuring 6.1 x 3.4 cm. No acute fracture of the right hip with cemented right total hip arthroplasty. The bony pelvis appears intact. IMPRESSION: There is a comminuted, closed subtrochanteric fracture of the left femur with lateral displacement of the main fracture fragment by 1 shaft width and 1/2 shaft width medial displacement of a fracture fragment that includes a portion of the lesser trochanter. A segmental fracture  fragment is seen superimposed over the proximal femoral diaphysis measuring 6.1 x 3.4 cm Intact right total hip arthroplasty. Electronically Signed   By: Ashley Royalty M.D.   On: 08/19/2018 18:19    Procedures Procedures (including critical care time)  Medications Ordered in ED Medications  morphine 2 MG/ML injection (has no administration in time range)  morphine 2 MG/ML injection 2 mg (2 mg Intravenous Given 08/19/18 1908)     Initial Impression / Assessment and Plan / ED Course  I have reviewed the triage vital signs and the nursing notes.  Pertinent labs & imaging results that were available during my care of the patient were reviewed by me and considered in my medical decision making (see chart for details).    I spoke with the [facility] about the patient who will admit him for further evaluation.  Spoke with Dr. Lynann Bologna who states that he will pass the information along to Dr. Mayer Camel.  Patient is otherwise stable here in the emergency department and had no complications during her time here in the ER.  Final Clinical Impressions(s) / ED Diagnoses   Final diagnoses:  None    ED Discharge Orders    None       Rebeca Allegra 08/19/18 2034    Lacretia Leigh, MD 08/21/18 1108

## 2018-08-19 NOTE — ED Notes (Signed)
Morphine initial vial burst.  Wasted with [employer]ch[employer], [employer].  New vial taken and witnessed by Seth Bake, RN.

## 2018-08-20 ENCOUNTER — Encounter (HOSPITAL_COMMUNITY): Payer: Self-pay

## 2018-08-20 ENCOUNTER — Inpatient Hospital Stay (HOSPITAL_COMMUNITY): Payer: Medicare Other

## 2018-08-20 ENCOUNTER — Inpatient Hospital Stay (HOSPITAL_COMMUNITY): Payer: Medicare Other | Admitting: Anesthesiology

## 2018-08-20 ENCOUNTER — Encounter (HOSPITAL_COMMUNITY)
Admission: EM | Disposition: A | Discharge status: Skilled Nursing Facility | Payer: Self-pay | Source: Home / Self Care | Attending: Internal Medicine

## 2018-08-20 DIAGNOSIS — Z01810 Encounter for preprocedural cardiovascular examination: Secondary | ICD-10-CM

## 2018-08-20 HISTORY — PX: FEMUR IM NAIL: SHX1597

## 2018-08-20 LAB — SURGICAL PCR SCREEN
MRSA, PCR: NEGATIVE
STAPHYLOCOCCUS AUREUS: NEGATIVE

## 2018-08-20 LAB — GLUCOSE, CAPILLARY
GLUCOSE-CAPILLARY: 136 mg/dL — AB (ref 70–99)
Glucose-Capillary: 199 mg/dL — ABNORMAL HIGH (ref 70–99)
Glucose-Capillary: 210 mg/dL — ABNORMAL HIGH (ref 70–99)
Glucose-Capillary: 138 mg/dL — ABNORMAL HIGH (ref 70–99)
Glucose-Capillary: 299 mg/dL — ABNORMAL HIGH (ref 70–99)

## 2018-08-20 LAB — HEMOGLOBIN AND HEMATOCRIT, BLOOD
HEMATOCRIT: 24.1 % — AB (ref 36.0–46.0)
Hemoglobin: 8.1 g/dL — ABNORMAL LOW (ref 12.0–15.0)

## 2018-08-20 LAB — PREPARE RBC (CROSSMATCH)

## 2018-08-20 LAB — ABO/RH: ABO/RH(D): O POS

## 2018-08-20 SURGERY — INSERTION, INTRAMEDULLARY ROD, FEMUR
Anesthesia: General | Site: Hip | Laterality: Left

## 2018-08-20 MED ORDER — POVIDONE-IODINE 10 % EX SWAB
2.0000 "application " | Freq: Once | CUTANEOUS | Status: AC
  Administered 2018-08-20: 2 via TOPICAL

## 2018-08-20 MED ORDER — ASPIRIN EC 81 MG PO TBEC: 81.0000 mg | DELAYED_RELEASE_TABLET | Freq: Two times a day (BID) | ORAL | 0 refills | Status: DC

## 2018-08-20 MED ORDER — ACETAMINOPHEN 325 MG PO TABS
325.0000 mg | ORAL_TABLET | Freq: Four times a day (QID) | ORAL | Status: DC | PRN
  Administered 2018-08-22: 650 mg via ORAL
  Filled 2018-08-20 (×2): qty 2

## 2018-08-20 MED ORDER — HYDROCODONE-ACETAMINOPHEN 5-325 MG PO TABS
1.0000 | ORAL_TABLET | ORAL | Status: DC | PRN
  Administered 2018-08-21: 2 via ORAL
  Filled 2018-08-20: qty 2

## 2018-08-20 MED ORDER — METHOCARBAMOL 500 MG PO TABS
500.0000 mg | ORAL_TABLET | Freq: Four times a day (QID) | ORAL | Status: DC | PRN
  Administered 2018-08-21 – 2018-08-24 (×3): 500 mg via ORAL
  Filled 2018-08-20 (×3): qty 1

## 2018-08-20 MED ORDER — LACTATED RINGERS IV SOLN
INTRAVENOUS | Status: DC
  Administered 2018-08-20: 13:00:00 via INTRAVENOUS

## 2018-08-20 MED ORDER — SUGAMMADEX SODIUM 200 MG/2ML IV SOLN
INTRAVENOUS | Status: DC | PRN
  Administered 2018-08-20: 100 mg via INTRAVENOUS

## 2018-08-20 MED ORDER — DEXTROSE-NACL 5-0.45 % IV SOLN
INTRAVENOUS | Status: DC
  Administered 2018-08-20: 08:00:00 via INTRAVENOUS

## 2018-08-20 MED ORDER — ONDANSETRON HCL 4 MG/2ML IJ SOLN
INTRAMUSCULAR | Status: DC | PRN
  Administered 2018-08-20: 4 mg via INTRAVENOUS

## 2018-08-20 MED ORDER — MENTHOL 3 MG MT LOZG
1.0000 | LOZENGE | OROMUCOSAL | Status: DC | PRN
  Filled 2018-08-20: qty 9

## 2018-08-20 MED ORDER — ROCURONIUM BROMIDE 10 MG/ML (PF) SYRINGE
PREFILLED_SYRINGE | INTRAVENOUS | Status: DC | PRN
  Administered 2018-08-20: 40 mg via INTRAVENOUS

## 2018-08-20 MED ORDER — LACTATED RINGERS IV SOLN
INTRAVENOUS | Status: DC | PRN
  Administered 2018-08-20: 15:00:00 via INTRAVENOUS

## 2018-08-20 MED ORDER — ETOMIDATE 2 MG/ML IV SOLN
INTRAVENOUS | Status: AC
  Filled 2018-08-20: qty 10

## 2018-08-20 MED ORDER — DEXAMETHASONE SODIUM PHOSPHATE 10 MG/ML IJ SOLN
INTRAMUSCULAR | Status: AC
  Filled 2018-08-20: qty 1

## 2018-08-20 MED ORDER — PROPOFOL 10 MG/ML IV BOLUS
INTRAVENOUS | Status: DC | PRN
  Administered 2018-08-20: 70 mg via INTRAVENOUS

## 2018-08-20 MED ORDER — TRANEXAMIC ACID 1000 MG/10ML IV SOLN
1000.0000 mg | INTRAVENOUS | Status: AC
  Administered 2018-08-20: 1000 mg via INTRAVENOUS
  Filled 2018-08-20: qty 10

## 2018-08-20 MED ORDER — ALBUMIN HUMAN 5 % IV SOLN
INTRAVENOUS | Status: AC
  Filled 2018-08-20: qty 250

## 2018-08-20 MED ORDER — LIDOCAINE 2% (20 MG/ML) 5 ML SYRINGE
INTRAMUSCULAR | Status: DC | PRN
  Administered 2018-08-20: 75 mg via INTRAVENOUS

## 2018-08-20 MED ORDER — PHENYLEPHRINE HCL 10 MG/ML IJ SOLN
INTRAMUSCULAR | Status: AC
  Filled 2018-08-20: qty 1

## 2018-08-20 MED ORDER — DOCUSATE SODIUM 100 MG PO CAPS
100.0000 mg | ORAL_CAPSULE | Freq: Two times a day (BID) | ORAL | Status: DC
  Administered 2018-08-20 – 2018-08-25 (×10): 100 mg via ORAL
  Filled 2018-08-20 (×10): qty 1

## 2018-08-20 MED ORDER — SUGAMMADEX SODIUM 200 MG/2ML IV SOLN
INTRAVENOUS | Status: AC
  Filled 2018-08-20: qty 2

## 2018-08-20 MED ORDER — FENTANYL CITRATE (PF) 100 MCG/2ML IJ SOLN
INTRAMUSCULAR | Status: AC
  Filled 2018-08-20: qty 2

## 2018-08-20 MED ORDER — METHOCARBAMOL 500 MG IVPB - SIMPLE MED
500.0000 mg | Freq: Four times a day (QID) | INTRAVENOUS | Status: DC | PRN
  Filled 2018-08-20: qty 50

## 2018-08-20 MED ORDER — DEXAMETHASONE SODIUM PHOSPHATE 10 MG/ML IJ SOLN
INTRAMUSCULAR | Status: DC | PRN
  Administered 2018-08-20: 5 mg via INTRAVENOUS

## 2018-08-20 MED ORDER — FENTANYL CITRATE (PF) 250 MCG/5ML IJ SOLN
INTRAMUSCULAR | Status: DC | PRN
  Administered 2018-08-20 (×4): 25 ug via INTRAVENOUS

## 2018-08-20 MED ORDER — SUCCINYLCHOLINE CHLORIDE 200 MG/10ML IV SOSY: PREFILLED_SYRINGE | INTRAVENOUS | Status: DC | PRN

## 2018-08-20 MED ORDER — TIZANIDINE HCL 2 MG PO TABS: 2.0000 mg | ORAL_TABLET | Freq: Four times a day (QID) | ORAL | 0 refills | Status: DC | PRN

## 2018-08-20 MED ORDER — EPHEDRINE SULFATE-NACL 50-0.9 MG/10ML-% IV SOSY
PREFILLED_SYRINGE | INTRAVENOUS | Status: DC | PRN
  Administered 2018-08-20 (×3): 10 mg via INTRAVENOUS

## 2018-08-20 MED ORDER — PHENYLEPHRINE 40 MCG/ML (10ML) SYRINGE FOR IV PUSH (FOR BLOOD PRESSURE SUPPORT)
PREFILLED_SYRINGE | INTRAVENOUS | Status: AC
  Filled 2018-08-20: qty 10

## 2018-08-20 MED ORDER — EPHEDRINE 5 MG/ML INJ
INTRAVENOUS | Status: AC
  Filled 2018-08-20: qty 10

## 2018-08-20 MED ORDER — ROCURONIUM BROMIDE 10 MG/ML (PF) SYRINGE
PREFILLED_SYRINGE | INTRAVENOUS | Status: AC
  Filled 2018-08-20: qty 10

## 2018-08-20 MED ORDER — TRANEXAMIC ACID 1000 MG/10ML IV SOLN
2000.0000 mg | INTRAVENOUS | Status: DC
  Filled 2018-08-20: qty 20

## 2018-08-20 MED ORDER — HYDROGEN PEROXIDE 3 % EX SOLN
CUTANEOUS | Status: AC
  Filled 2018-08-20: qty 473

## 2018-08-20 MED ORDER — KCL IN DEXTROSE-NACL 20-5-0.45 MEQ/L-%-% IV SOLN
INTRAVENOUS | Status: DC
  Administered 2018-08-20 – 2018-08-22 (×2): via INTRAVENOUS
  Filled 2018-08-20 (×3): qty 1000

## 2018-08-20 MED ORDER — HYDROCODONE-ACETAMINOPHEN 5-325 MG PO TABS: 1.0000 | ORAL_TABLET | Freq: Four times a day (QID) | ORAL | 0 refills | Status: DC | PRN

## 2018-08-20 MED ORDER — METOCLOPRAMIDE HCL 5 MG PO TABS: 5.0000 mg | ORAL_TABLET | Freq: Three times a day (TID) | ORAL | Status: DC | PRN

## 2018-08-20 MED ORDER — ONDANSETRON HCL 4 MG/2ML IJ SOLN: 4.0000 mg | Freq: Four times a day (QID) | INTRAMUSCULAR | Status: DC | PRN

## 2018-08-20 MED ORDER — PHENOL 1.4 % MT LIQD: 1.0000 | OROMUCOSAL | Status: DC | PRN

## 2018-08-20 MED ORDER — MUPIROCIN 2 % EX OINT
1.0000 "application " | TOPICAL_OINTMENT | Freq: Two times a day (BID) | CUTANEOUS | Status: AC
  Administered 2018-08-20 – 2018-08-21 (×5): 1 via NASAL
  Filled 2018-08-20 (×2): qty 22

## 2018-08-20 MED ORDER — VANCOMYCIN HCL IN DEXTROSE 1-5 GM/200ML-% IV SOLN
1000.0000 mg | INTRAVENOUS | Status: AC
  Administered 2018-08-20: 1000 mg via INTRAVENOUS
  Filled 2018-08-20: qty 200

## 2018-08-20 MED ORDER — ONDANSETRON HCL 4 MG/2ML IJ SOLN
INTRAMUSCULAR | Status: AC
  Filled 2018-08-20: qty 2

## 2018-08-20 MED ORDER — HYDROCODONE-ACETAMINOPHEN 7.5-325 MG PO TABS
1.0000 | ORAL_TABLET | ORAL | Status: DC | PRN
  Administered 2018-08-23 – 2018-08-24 (×2): 2 via ORAL
  Filled 2018-08-20 (×2): qty 2
  Filled 2018-08-20: qty 1

## 2018-08-20 MED ORDER — ENSURE ENLIVE PO LIQD
237.0000 mL | Freq: Two times a day (BID) | ORAL | Status: DC
  Administered 2018-08-22 – 2018-08-25 (×4): 237 mL via ORAL

## 2018-08-20 MED ORDER — PROPOFOL 10 MG/ML IV BOLUS
INTRAVENOUS | Status: AC
  Filled 2018-08-20: qty 20

## 2018-08-20 MED ORDER — MORPHINE SULFATE (PF) 4 MG/ML IV SOLN
0.5000 mg | INTRAVENOUS | Status: DC | PRN
  Administered 2018-08-22 – 2018-08-24 (×2): 1 mg via INTRAVENOUS
  Filled 2018-08-20 (×2): qty 1

## 2018-08-20 MED ORDER — PHENYLEPHRINE 40 MCG/ML (10ML) SYRINGE FOR IV PUSH (FOR BLOOD PRESSURE SUPPORT)
PREFILLED_SYRINGE | INTRAVENOUS | Status: DC | PRN
  Administered 2018-08-20 (×9): 80 ug via INTRAVENOUS

## 2018-08-20 MED ORDER — METOCLOPRAMIDE HCL 5 MG/ML IJ SOLN: 5.0000 mg | Freq: Three times a day (TID) | INTRAMUSCULAR | Status: DC | PRN

## 2018-08-20 MED ORDER — SODIUM CHLORIDE 0.9 % IV SOLN: INTRAVENOUS | Status: DC | PRN

## 2018-08-20 MED ORDER — SODIUM CHLORIDE 0.9 % IV SOLN
INTRAVENOUS | Status: DC | PRN
  Administered 2018-08-20: 50 ug/min via INTRAVENOUS

## 2018-08-20 MED ORDER — ONDANSETRON HCL 4 MG PO TABS: 4.0000 mg | ORAL_TABLET | Freq: Four times a day (QID) | ORAL | Status: DC | PRN

## 2018-08-20 MED ORDER — LIDOCAINE 2% (20 MG/ML) 5 ML SYRINGE
INTRAMUSCULAR | Status: AC
  Filled 2018-08-20: qty 5

## 2018-08-20 SURGICAL SUPPLY — 61 items
BAG SPEC THK2 15X12 ZIP CLS (MISCELLANEOUS) ×1
BAG ZIPLOCK 12X15 (MISCELLANEOUS) ×2 IMPLANT
BIT DRILL 4.3MMS DISTAL GRDTED (BIT) IMPLANT
BNDG COHESIVE 6X5 TAN STRL LF (GAUZE/BANDAGES/DRESSINGS) ×2 IMPLANT
CABLE CERLAGE W/CRIMP 1.8 (Cable) ×2 IMPLANT
COVER PERINEAL POST (MISCELLANEOUS) ×2 IMPLANT
COVER SURGICAL LIGHT HANDLE (MISCELLANEOUS) ×2 IMPLANT
DRAPE C-ARM 42X120 X-RAY (DRAPES) ×2 IMPLANT
DRAPE INCISE IOBAN 66X45 STRL (DRAPES) ×2 IMPLANT
DRAPE ORTHO SPLIT 77X108 STRL (DRAPES) ×4
DRAPE SHEET LG 3/4 BI-LAMINATE (DRAPES) ×2 IMPLANT
DRAPE STERI IOBAN 125X83 (DRAPES) ×2 IMPLANT
DRAPE SURG ORHT 6 SPLT 77X108 (DRAPES) ×2 IMPLANT
DRAPE U-SHAPE 47X51 STRL (DRAPES) ×2 IMPLANT
DRILL 4.3MMS DISTAL GRADUATED (BIT) ×2
DRSG AQUACEL AG ADV 3.5X14 (GAUZE/BANDAGES/DRESSINGS) ×1 IMPLANT
DRSG PAD ABDOMINAL 8X10 ST (GAUZE/BANDAGES/DRESSINGS) ×2 IMPLANT
DURAPREP 26ML APPLICATOR (WOUND CARE) ×2 IMPLANT
ELECT REM PT RETURN 15FT ADLT (MISCELLANEOUS) ×2 IMPLANT
FACESHIELD WRAPAROUND (MASK) IMPLANT
FACESHIELD WRAPAROUND OR TEAM (MASK) IMPLANT
GAUZE SPONGE 4X4 12PLY STRL (GAUZE/BANDAGES/DRESSINGS) ×2 IMPLANT
GLOVE BIO SURGEON STRL SZ7 (GLOVE) ×2 IMPLANT
GLOVE BIO SURGEON STRL SZ7.5 (GLOVE) ×2 IMPLANT
GLOVE BIO SURGEON STRL SZ8 (GLOVE) ×2 IMPLANT
GLOVE BIOGEL PI IND STRL 7.0 (GLOVE) ×1 IMPLANT
GLOVE BIOGEL PI IND STRL 8 (GLOVE) ×1 IMPLANT
GLOVE BIOGEL PI IND STRL 8.5 (GLOVE) IMPLANT
GLOVE BIOGEL PI IND STRL 9 (GLOVE) IMPLANT
GLOVE BIOGEL PI INDICATOR 7.0 (GLOVE) ×1
GLOVE BIOGEL PI INDICATOR 8 (GLOVE) ×1
GLOVE BIOGEL PI INDICATOR 8.5 (GLOVE) ×2
GLOVE BIOGEL PI INDICATOR 9 (GLOVE) ×2
GUIDEPIN 3.2X17.5 THRD DISP (PIN) ×1 IMPLANT
GUIDEWIRE BALL NOSE 80CM (WIRE) ×1 IMPLANT
HIP FRAC NAIL LEFT 11X360MM (Orthopedic Implant) ×2 IMPLANT
KIT BASIN OR (CUSTOM PROCEDURE TRAY) ×2 IMPLANT
NAIL HIP FRAC LEFT 11X360MM (Orthopedic Implant) IMPLANT
PACK GENERAL/GYN (CUSTOM PROCEDURE TRAY) ×2 IMPLANT
PACK ORTHO EXTREMITY (CUSTOM PROCEDURE TRAY) ×2 IMPLANT
PAD CAST 4YDX4 CTTN HI CHSV (CAST SUPPLIES) ×1 IMPLANT
PADDING CAST COTTON 4X4 STRL (CAST SUPPLIES) ×2
POSITIONER SURGICAL ARM (MISCELLANEOUS) ×2 IMPLANT
SCREW BONE CORTICAL 5.0X40 (Screw) ×1 IMPLANT
SCREW DRILL BIT ANIT ROTATION (BIT) ×1 IMPLANT
SCREW LAG 6.5X80X10.5XLRG ST (Screw) IMPLANT
SCREW LAG 80MM (Screw) ×2 IMPLANT
SCREWDRIVER HEX TIP 3.5MM (MISCELLANEOUS) ×1 IMPLANT
STAPLER VISISTAT (STAPLE) ×2 IMPLANT
STOCKINETTE 8 INCH (MISCELLANEOUS) ×2 IMPLANT
SUT VIC AB 0 CT1 36 (SUTURE) ×1 IMPLANT
SUT VIC AB 1 CT1 36 (SUTURE) ×1 IMPLANT
SUT VIC AB 2-0 CT1 27 (SUTURE) ×4
SUT VIC AB 2-0 CT1 27XBRD (SUTURE) IMPLANT
SUT VIC AB 2-0 CT1 TAPERPNT 27 (SUTURE) IMPLANT
SUT VIC AB 3-0 CT1 27 (SUTURE) ×4
SUT VIC AB 3-0 CT1 TAPERPNT 27 (SUTURE) IMPLANT
TOWEL OR 17X26 10 PK STRL BLUE (TOWEL DISPOSABLE) ×2 IMPLANT
TRAY CATH 16FR W/PLASTIC CATH (SET/KITS/TRAYS/PACK) ×2 IMPLANT
TRAY FOL W/BAG SLVR 16FR STRL (SET/KITS/TRAYS/PACK) IMPLANT
TRAY FOLEY W/BAG SLVR 16FR LF (SET/KITS/TRAYS/PACK) ×2

## 2018-08-20 NOTE — Care Management Note (Signed)
Case Management Note  Patient Details  Name: [name] MRN: [id number] Date of Birth: May 27, 1928  Subjective/Objective:                  84 y.o. female with a hx of ischemic dilated cardiomyopathy, chronic systolic and diastolic heart failure, CAD status post CABG, carotid artery stenosis (RICA occlused), dyslipidemia, HTN, mitral stenosis status post MVR, complete heart block status post pacemaker placement, paroxysmal A. fib with last pacer check 06/09/18 with good function, not on anticoagulation, CKD stage III who is being seen today for the evaluation of preoperative clearance at the request of jDr. Patrecia Pour.   X-rays revealed a left hip fracture.  Dr. Mayer Camel was consulted for surgical intervention.  Action/Plan: Progression-surgery pending at this time/cardi-cleared for surg/ Cm needs- will follow to see if return to home with hhc or if snf placment is needed/pt lives alone. Expected Discharge Date:                  Expected Discharge Plan:     In-House Referral:     Discharge planning Services     Post Acute Care Choice:    Choice offered to:     DME Arranged:    DME Agency:     HH Arranged:    HH Agency:     Status of Service:     If discussed at [employer], dates discussed:    Additional Comments:  Leeroy Cha, RN 08/20/2018, 11:32 AM

## 2018-08-20 NOTE — Anesthesia Procedure Notes (Signed)
Procedure Name: Intubation Date/Time: 08/20/2018 2:36 PM Performed by: Lollie Sails, CRNA Pre-anesthesia Checklist: Patient identified, Emergency Drugs available, Suction available, Patient being monitored and Timeout performed Patient Re-evaluated:Patient Re-evaluated prior to induction Oxygen Delivery Method: Circle system utilized Preoxygenation: Pre-oxygenation with 100% oxygen Induction Type: IV induction Ventilation: Mask ventilation without difficulty and Oral airway inserted - appropriate to patient size Laryngoscope Size: Sabra Heck and 2 Grade View: Grade II Tube type: Oral Tube size: 7.0 mm Number of attempts: 1 Airway Equipment and Method: Stylet Placement Confirmation: ETT inserted through vocal cords under direct vision,  positive ETCO2 and breath sounds checked- equal and bilateral Secured at: 21 cm Tube secured with: Tape Dental Injury: Teeth and Oropharynx as per pre-operative assessment

## 2018-08-20 NOTE — Progress Notes (Signed)
Pt's daughter in to visit left contact numbers;  Michaele Offer Home: [phone] Cell: [phone]  Son in [place]  Cell: [phone]

## 2018-08-20 NOTE — Progress Notes (Signed)
Bladder scan performed per MD orders. 393 post void residual noted.

## 2018-08-20 NOTE — Discharge Instructions (Addendum)
Open Reduction and Internal Fixation for Hip Fracture, Care After Refer to this sheet in the next few weeks. These instructions provide you with information about caring for yourself after your procedure. Your health care provider may also give you more specific instructions. Your treatment has been planned according to current medical practices, but problems sometimes occur. Call your health care provider if you have any problems or questions after your procedure. What can I expect after the procedure? After your procedure, it is typical to have the following:  Pain.  Swelling.  Difficulty walking.  Follow these instructions at home:  Have someone help you with everyday activities, such as showering and meals, for the first week after you leave the hospital or as directed.  Take medicines only as directed by your health care provider. Do not take any over-the-counter medicines without approval from your health care provider. Medicines such as aspirin and ibuprofen can increase your risk of bleeding.  Constipation is common after surgery from the pain medicines used and the decrease in your activity level. It is important to drink fluids and to increase your intake of fruits and vegetables. Stool softeners may be prescribed by your health care provider.  There are many different ways to close and cover an incision, including stitches, skin glue, and adhesive strips. Follow your health care provider's instructions about: ? Incision care. ? Bandage (dressing) changes and removal. ? Incision closure removal.  Wear compression stockings as directed by your health care provider. These stockings help to prevent blood clots and reduce swelling in your legs.  Do not take baths, swim, or use a hot tub until your health care provider approves.  Use crutches or a walker as directed by your health care provider.  Be sure to do any exercises that your physical therapist suggests. These exercises  will help to make your hip stronger and help you to recover more quickly.  Ask your health care provider when you can resume other activities, such as work, driving, and sex.  Do not drive or operate heavy machinery while taking pain medicine.  Keep all follow-up visits as directed by your health care provider. This is important. Contact a health care provider if:  You have fatigue.  You feel weak.  Your pain is not relieved by medicines.  You have a fever.  You have drainage, redness, swelling, or pain at your incision. Get help right away if:  Your incision is bleeding.  Your leg or foot is painful and swollen.  Your leg is pale or blue.  Your leg is cold.  Your leg tingles or is numb.  You have trouble breathing.  You have chest pain. This information is not intended to replace advice given to you by your health care provider. Make sure you discuss any questions you have with your health care provider. Document Released: 07/14/2014 Document Revised: 05/21/2016 Document Reviewed: 05/20/2014 Elsevier Interactive Patient Education  2018 [place]. Hip Fracture A hip fracture is a fracture of the upper part of your thigh bone (femur). What are the causes? A hip fracture is caused by a direct blow to the side of your hip. This is usually the result of a fall but can occur in other circumstances, such as an automobile accident. What increases the risk? There is an increased risk of hip fractures in people with:  An unsteady walking pattern (gait) and those with conditions that contribute to poor balance, such as Parkinson's disease or dementia.  Osteopenia and  osteoporosis.  Cancer that spreads to the leg bones.  Certain metabolic diseases.  What are the signs or symptoms? Symptoms of hip fracture include:  Pain over the injured hip.  Inability to put weight on the leg in which the fracture occurred (although, some patients are able to walk after a hip  fracture).  Toes and foot of the affected leg point outward when you lie down.  How is this diagnosed? A physical exam can determine if a hip fracture is likely to have occurred. X-ray exams are needed to confirm the fracture and to look for other injuries. The X-ray exam can help to determine the type of hip fracture. Rarely, the fracture is not visible on an X-ray image and a CT scan or MRI will have to be done. How is this treated? The treatment for a fracture is usually surgery. This means using a screw, nail, or rod to hold the bones in place. Follow these instructions at home: Take all medicines as directed by your health care provider. Contact a health care provider if: Pain continues, even after taking pain medicine. This information is not intended to replace advice given to you by your health care provider. Make sure you discuss any questions you have with your health care provider. Document Released: 12/17/2005 Document Revised: 05/24/2016 Document Reviewed: 07/29/2013 Elsevier Interactive Patient Education  2017 [employer].

## 2018-08-20 NOTE — Anesthesia Procedure Notes (Signed)
Arterial Line Insertion Start/End8/21/2019 2:50 PM, 08/20/2018 2:53 PM Performed by: Freddrick March, MD, anesthesiologist  Patient location: OR. Preanesthetic checklist: patient identified, IV checked, risks and benefits discussed, surgical consent, monitors and equipment checked, pre-op evaluation, timeout performed and anesthesia consent Patient sedated Right, radial was placed Catheter size: 20 G Hand hygiene performed  and maximum sterile barriers used   Attempts: 1 Procedure performed without using ultrasound guided technique. Following insertion, Biopatch and dressing applied. Post procedure assessment: normal  Patient tolerated the procedure well with no immediate complications.

## 2018-08-20 NOTE — Progress Notes (Signed)
Patient was taken to OR for procedure at 1223

## 2018-08-20 NOTE — Interval H&P Note (Signed)
History and Physical Interval Note:  08/20/2018 2:08 PM  [name]  has presented today for surgery, with the diagnosis of left hip subtrochanteric femur fracture  The various methods of treatment have been discussed with the patient and family. After consideration of risks, benefits and other options for treatment, the patient has consented to  Procedure(s): INTRAMEDULLARY (IM) NAIL FEMORAL (Left) as a surgical intervention .  The patient's history has been reviewed, patient examined, no change in status, stable for surgery.  I have reviewed the patient's chart and labs.  Questions were answered to the patient's satisfaction.     Kerin Salen

## 2018-08-20 NOTE — Consult Note (Signed)
Reason for Consult: Left hip fracture -subtrochanteric  Referring Physician: Anthony Allen, MD  [name] is an 84 y.o. female.  HPI:  [name] is a 84 y.o. female with medical history significant for hypertension, hyperlipidemia, diabetes mellitus, stroke, CHF with EF of 30%, complete heart block, pacemaker placement, CAD, pulmonary hypertension, CKD 3, who presents with a fall and left hip pain.  X-rays revealed a left hip fracture.  Dr. Rowan was consulted for surgical intervention.  Past Medical History:  Diagnosis Date  . Arthritis    "left wrist; back" (07/29/2013)  . Cancer (HCC) Sept. 2015   Left Hand  -  Skin Cancer  . Carotid stenosis    occluded right intracranial ICA followed by Dr. Early.  1-39% left ICA stenosis by dopplers 10/2017  . Chronic combined systolic and diastolic CHF (congestive heart failure) (HCC)   . Chronic kidney disease, unspecified   . Complete heart block (HCC) 07/29/2013   a. s/p MDT dual chamber PPM  . Coronary artery disease    s/p CABG  . Hyperlipidemia   . Hypertension   . Ischemic cardiomyopathy     s/p MDT single chamber ICD 2006.  EF improved and device was downgraded to dual chamber PPM 2014.  EF normalized by echo 2014 and at time of battery changeout ICD changed to PPM.  EF then decreased to 20-25% by echo 04/2016.  Seen in AHF clinic and plan was for not pursue cath given her lack of sx and advanced age. Her Entresto was increased and it was decided that she was too high risk for CRTP   . Osteoporosis   . Overactive bladder   . Pacemaker   . Postoperative atrial fibrillation (HCC)   . Pulmonary HTN (HCC)    severe by echo 04/2016 - likely Group 2 from pulmonary venous HTN secondary to LV dysfunction and MR  . Severe mitral regurgitation    s/p MV ring.  Moderate MR and mild mitral stenosis on echo 04/2016  . Stroke (HCC) 07/2005  . Type II diabetes mellitus (HCC)     Past Surgical History:  Procedure Laterality Date  .  APPENDECTOMY  1949  . CARDIAC DEFIBRILLATOR PLACEMENT  11/2005   by Dr Edmunds (MDT) she has a 6949 fidelis lead  . CATARACT EXTRACTION W/ INTRAOCULAR LENS  IMPLANT, BILATERAL Bilateral 1980's  . CORONARY ARTERY BYPASS GRAFT  07/2005   "CABG X3" (07/29/2013)  . MITRAL VALVE REPLACEMENT  07/2005  . PERMANENT PACEMAKER INSERTION N/A 07/29/2013   upgrade of single chamber ICD to Medtronic Adapta L DR implanted with new RA and RV leads inserted  . SALPINGOOPHORECTOMY Bilateral 1976  . SHOULDER ARTHROSCOPY W/ ROTATOR CUFF REPAIR Right 1998  . TOTAL HIP ARTHROPLASTY Right 2001    Family History  Problem Relation Age of Onset  . Cancer Mother        Uterian   . Heart disease Mother        Before age 60  . Heart disease Sister        Before age 60-  CABG  . Cancer Sister        Uterain  . Diabetes Sister   . Hyperlipidemia Sister   . Heart attack Sister   . Diabetes Daughter   . Coronary artery disease Unknown        family hx of  . Hypertension Unknown        family hx of  . Diabetes Unknown          family hx of    Social History:  reports that she has never smoked. She has never used smokeless tobacco. She reports that she does not drink alcohol or use drugs.  Allergies:  Allergies  Allergen Reactions  . Fosamax [Alendronate] Anaphylaxis  . Horse-Derived Products Hives  . Clonidine Derivatives Other (See Comments)    Unknown reaction  . Darvocet [Propoxyphene N-Acetaminophen] Nausea And Vomiting  . Lipitor [Atorvastatin] Swelling  . Sulfa Drugs Cross Reactors Hives and Itching  . Tramadol Nausea And Vomiting  . Vicodin [Hydrocodone-Acetaminophen] Other (See Comments)    lightheaded  . Penicillins Rash    Has patient had a PCN reaction causing immediate rash, facial/tongue/throat swelling, SOB or lightheadedness with hypotension: Yes Has patient had a PCN reaction causing severe rash involving mucus membranes or skin necrosis: No Has patient had a PCN reaction that required  hospitalization Yes Has patient had a PCN reaction occurring within the last 10 years: No If all of the above answers are "NO", then may proceed with Cephalosporin  . Septra [Sulfamethoxazole-Trimethoprim] Rash    Medications: I have reviewed the patient's current medications.  Results for orders placed or performed during the hospital encounter of 08/19/18 (from the past 48 hour(s))  Comprehensive metabolic panel     Status: Abnormal   Collection Time: 08/19/18  5:15 PM  Result Value Ref Range   Sodium 141 135 - 145 mmol/L   Potassium 4.4 3.5 - 5.1 mmol/L   Chloride 110 98 - 111 mmol/L   CO2 23 22 - 32 mmol/L   Glucose, Bld 230 (H) 70 - 99 mg/dL   BUN 27 (H) 8 - 23 mg/dL   Creatinine, Ser 1.33 (H) 0.44 - 1.00 mg/dL   Calcium 9.4 8.9 - 10.3 mg/dL   Total Protein 5.7 (L) 6.5 - 8.1 g/dL   Albumin 3.2 (L) 3.5 - 5.0 g/dL   AST 57 (H) 15 - 41 U/L   ALT 24 0 - 44 U/L   Alkaline Phosphatase 63 38 - 126 U/L   Total Bilirubin 1.3 (H) 0.3 - 1.2 mg/dL   GFR calc non Af Amer 35 (L) >60 mL/min   GFR calc Af Amer 40 (L) >60 mL/min    Comment: (NOTE) The eGFR has been calculated using the CKD EPI equation. This calculation has not been validated in all clinical situations. eGFR's persistently <60 mL/min signify possible Chronic Kidney Disease.    Anion gap 8 5 - 15    Comment: Performed at [facility], [address] , [place] [address]  CBC with Differential     Status: Abnormal   Collection Time: 08/19/18  5:15 PM  Result Value Ref Range   WBC 10.0 4.0 - 10.5 K/uL   RBC 2.85 (L) 3.87 - 5.11 MIL/uL   Hemoglobin 9.4 (L) 12.0 - 15.0 g/dL   HCT 29.1 (L) 36.0 - 46.0 %   MCV 102.1 (H) 78.0 - 100.0 fL   MCH 33.0 26.0 - 34.0 pg   MCHC 32.3 30.0 - 36.0 g/dL   RDW 12.4 11.5 - 15.5 %   Platelets 170 150 - 400 K/uL   Neutrophils Relative % 78 %   Lymphocytes Relative 10 %   Monocytes Relative 10 %   Eosinophils Relative 2 %   Basophils Relative 0 %   Neutro Abs 7.8 (H) 1.7  - 7.7 K/uL   Lymphs Abs 1.0 0.7 - 4.0 K/uL   Monocytes Absolute 1.0 0.1 - 1.0 K/uL     Eosinophils Absolute 0.2 0.0 - 0.7 K/uL   Basophils Absolute 0.0 0.0 - 0.1 K/uL   Smear Review MORPHOLOGY UNREMARKABLE     Comment: Performed at [facility], [address] [place], [place] [address]  CK     Status: Abnormal   Collection Time: 08/19/18  5:15 PM  Result Value Ref Range   Total CK 1,555 (H) 38 - 234 U/L    Comment: Performed at [facility], [address] [place], [place] [address]  Type and screen [facility]     Status: None   Collection Time: 08/19/18  5:15 PM  Result Value Ref Range   ABO/RH(D) O POS    Antibody Screen NEG    Sample Expiration      08/22/2018 Performed at [facility], [address] [place], [place] [address]   ABO/Rh     Status: None   Collection Time: 08/19/18  5:15 PM  Result Value Ref Range   ABO/RH(D)      O POS Performed at [facility], [address] [place], [place] [address]   CBG monitoring, ED     Status: Abnormal   Collection Time: 08/19/18  9:35 PM  Result Value Ref Range   Glucose-Capillary 262 (H) 70 - 99 mg/dL  Glucose, capillary     Status: Abnormal   Collection Time: 08/19/18 10:40 PM  Result Value Ref Range   Glucose-Capillary 254 (H) 70 - 99 mg/dL  ABO/Rh     Status: None   Collection Time: 08/19/18 10:47 PM  Result Value Ref Range   ABO/RH(D)      O POS Performed at [facility], [address] [place], [place] [address]   Type and screen [facility]     Status: None   Collection Time: 08/19/18 10:51 PM  Result Value Ref Range   ABO/RH(D) O POS    Antibody Screen NEG    Sample Expiration      08/22/2018 Performed at [facility], [address] [place], [place] [address]   Surgical PCR screen     Status: None   Collection Time: 08/20/18  1:22 AM  Result Value Ref Range   MRSA, PCR NEGATIVE NEGATIVE   Staphylococcus  aureus NEGATIVE NEGATIVE    Comment: (NOTE) The Xpert SA Assay (FDA approved for NASAL specimens in patients 22 years of age and older), is one component of a comprehensive surveillance program. It is not intended to diagnose infection nor to guide or monitor treatment. Performed at [facility], [address] [place], [place] [address]     Dg Chest 1 View  Result Date: 08/19/2018 CLINICAL DATA:  Patient fell last evening at 1839 hours and was found on the floor today. Syncopal episode. EXAM: CHEST  1 VIEW COMPARISON:  11/17/2015 FINDINGS: Heart and mediastinal contours are stable and within normal limits with post CABG change and mitral valvular replacement. Mild aortic atherosclerosis without aneurysm. ICD device projects over the left axilla with leads in the right atrium and right ventricle. Lungs are clear without acute pulmonary consolidations. Osteoarthritis of the right glenohumeral joint with high-riding humeral heads bilaterally. Remodeled appearance of the undersurface a the distal right clavicle. Findings likely reflect chronic rotator cuff tear. Differential possibility may include rheumatoid arthritis. IMPRESSION: No active cardiopulmonary disease. Aortic atherosclerosis. Electronically Signed   By: David  Kwon M.D.   On: 08/19/2018 18:11   Ct   Head Wo Contrast  Result Date: 08/19/2018 CLINICAL DATA:  Patient slipped and fell and was found on the floor. EXAM: CT HEAD WITHOUT CONTRAST CT CERVICAL SPINE WITHOUT CONTRAST TECHNIQUE: Multidetector CT imaging of the head and cervical spine was performed following the standard protocol without intravenous contrast. Multiplanar CT image reconstructions of the cervical spine were also generated. COMPARISON:  11/18/2017 and 11/16/2015 FINDINGS: CT HEAD FINDINGS BRAIN: There is mild to moderate sulcal and ventricular prominence consistent with superficial and central atrophy. No intraparenchymal hemorrhage, mass effect nor  midline shift. Periventricular and subcortical white matter hypodensities consistent with mild-to-moderate chronic small vessel ischemic disease are identified. No acute large vascular territory infarcts. No abnormal extra-axial fluid collections. Basal cisterns are not effaced and midline. Intact brainstem and cerebellum. VASCULAR: Moderate calcific atherosclerosis of the carotid siphons. SKULL: No skull fracture. No significant scalp soft tissue swelling. SINUSES/ORBITS: The mastoid air-cells are clear. The included paranasal sinuses are well-aerated.The included ocular globes and orbital contents are non-suspicious. OTHER: None. CT CERVICAL SPINE FINDINGS Alignment: There is maintained cervical lordosis Skull base and vertebrae: Osteoarthritis of the atlantodental interval with soft tissue pannus posterior to the odontoid process and subcortical erosive change is redemonstrated. Soft tissues and spinal canal: No prevertebral fluid or swelling. No visible canal hematoma. Disc levels: Minimal anterolisthesis grade 1 of C3 on C4. Moderate disc flattening at C5-6 and C6-7. Bilateral uncovertebral joint osteoarthritis is seen at C3-4, C4-5 to a greater extent C5-6 and C6-7. Multilevel degenerative facet arthropathy is seen. Small central disc protrusion C2-3. Central to right central disc bulge at C3-4, mild broad-based disc bulge C4-5, and disc-osteophyte complex at C5-6. Upper chest: Negative. Other: None IMPRESSION: Head CT: 1. Atrophy with chronic small vessel ischemia. No acute intracranial abnormality. 2. Cervical spondylosis without acute cervical spine fracture. Multilevel degenerative disc and facet arthropathy. Electronically Signed   By: David  Kwon M.D.   On: 08/19/2018 18:30   Ct Cervical Spine Wo Contrast  Result Date: 08/19/2018 CLINICAL DATA:  Patient slipped and fell and was found on the floor. EXAM: CT HEAD WITHOUT CONTRAST CT CERVICAL SPINE WITHOUT CONTRAST TECHNIQUE: Multidetector CT imaging of  the head and cervical spine was performed following the standard protocol without intravenous contrast. Multiplanar CT image reconstructions of the cervical spine were also generated. COMPARISON:  11/18/2017 and 11/16/2015 FINDINGS: CT HEAD FINDINGS BRAIN: There is mild to moderate sulcal and ventricular prominence consistent with superficial and central atrophy. No intraparenchymal hemorrhage, mass effect nor midline shift. Periventricular and subcortical white matter hypodensities consistent with mild-to-moderate chronic small vessel ischemic disease are identified. No acute large vascular territory infarcts. No abnormal extra-axial fluid collections. Basal cisterns are not effaced and midline. Intact brainstem and cerebellum. VASCULAR: Moderate calcific atherosclerosis of the carotid siphons. SKULL: No skull fracture. No significant scalp soft tissue swelling. SINUSES/ORBITS: The mastoid air-cells are clear. The included paranasal sinuses are well-aerated.The included ocular globes and orbital contents are non-suspicious. OTHER: None. CT CERVICAL SPINE FINDINGS Alignment: There is maintained cervical lordosis Skull base and vertebrae: Osteoarthritis of the atlantodental interval with soft tissue pannus posterior to the odontoid process and subcortical erosive change is redemonstrated. Soft tissues and spinal canal: No prevertebral fluid or swelling. No visible canal hematoma. Disc levels: Minimal anterolisthesis grade 1 of C3 on C4. Moderate disc flattening at C5-6 and C6-7. Bilateral uncovertebral joint osteoarthritis is seen at C3-4, C4-5 to a greater extent C5-6 and C6-7. Multilevel degenerative facet arthropathy is seen. Small central disc protrusion C2-3. Central to right   central disc bulge at C3-4, mild broad-based disc bulge C4-5, and disc-osteophyte complex at C5-6. Upper chest: Negative. Other: None IMPRESSION: Head CT: 1. Atrophy with chronic small vessel ischemia. No acute intracranial abnormality. 2.  Cervical spondylosis without acute cervical spine fracture. Multilevel degenerative disc and facet arthropathy. Electronically Signed   By: David  Kwon M.D.   On: 08/19/2018 18:30   Dg Hip Unilat With Pelvis 2-3 Views Left  Result Date: 08/19/2018 CLINICAL DATA:  Patient fell last evening was found on the floor today. Foreshortened left leg. EXAM: DG HIP (WITH OR WITHOUT PELVIS) 2-3V LEFT COMPARISON:  None. FINDINGS: AP view the pelvis as well as AP and frog-leg views of the left hip were provided. There is a comminuted, closed subtrochanteric fracture of the left femur with lateral displacement of the main fracture fragment by 1 shaft width and 1/2 shaft width medial displacement of a fracture fragment that includes a portion of the lesser trochanter. A segmental fracture fragment is seen superimposed over the proximal femoral diaphysis measuring 6.1 x 3.4 cm. No acute fracture of the right hip with cemented right total hip arthroplasty. The bony pelvis appears intact. IMPRESSION: There is a comminuted, closed subtrochanteric fracture of the left femur with lateral displacement of the main fracture fragment by 1 shaft width and 1/2 shaft width medial displacement of a fracture fragment that includes a portion of the lesser trochanter. A segmental fracture fragment is seen superimposed over the proximal femoral diaphysis measuring 6.1 x 3.4 cm Intact right total hip arthroplasty. Electronically Signed   By: David  Kwon M.D.   On: 08/19/2018 18:19    Review of Systems  Constitutional: Negative.   HENT: Positive for hearing loss.   Eyes: Negative.   Respiratory: Negative.   Cardiovascular: Negative.   Gastrointestinal: Negative.   Genitourinary: Negative.        CKD  Musculoskeletal: Positive for falls and joint pain.  Skin: Negative.   Neurological: Negative.   Psychiatric/Behavioral: Negative.    Blood pressure (!) 118/45, pulse (!) 59, temperature 98.3 F (36.8 C), temperature source Oral,  resp. rate 18, height 5' (1.524 m), weight 58.8 kg, SpO2 100 %. Physical Exam  Constitutional: She is oriented to person, place, and time. She appears well-developed and well-nourished.  HENT:  Head: Normocephalic and atraumatic.  Eyes: Pupils are equal, round, and reactive to light.  Neck: Normal range of motion. Neck supple.  Cardiovascular: Intact distal pulses.  Respiratory: Effort normal.  Musculoskeletal: She exhibits tenderness and deformity.  Pt's left leg is shortened and externally rotated.  She is able to wiggle her toes and is neurovascularly intact  Neurological: She is alert and oriented to person, place, and time.  Skin: Skin is warm and dry.  Psychiatric: She has a normal mood and affect. Her behavior is normal. Judgment and thought content normal.    Assessment/Plan: Closed comminuted subtrochanteric fracture of the left femur   Plan is for ORIF with femoral nail later this afternoon.  The benefits and risks of surgery were discussed and all questions were answered.  She will need a short hospital stay and most likely transfer to SNF afterwards to work on transfers and ambulation.  She was living independently prior to her fall.  Tesla Bochicchio 08/20/2018, 7:01 AM     

## 2018-08-20 NOTE — Progress Notes (Signed)
3 unsuccessful attempts to place foley catheter at this time. Cross cover MD made aware. Purewick placed at this.

## 2018-08-20 NOTE — Progress Notes (Signed)
Patient came back to 5 E at 1830. Alert and oriented x 2. Vital signs was taken. Daughter is at bedside.

## 2018-08-20 NOTE — Progress Notes (Addendum)
Notified MD Quincy Simmonds  At 1050 about 3 unsuccessful attempts to insert Foley catheter and request for urology consult.   Called daughter to update at 9 and 0945 but no one answer phone; left a message.

## 2018-08-20 NOTE — H&P (View-Only) (Signed)
Reason for Consult: Left hip fracture -subtrochanteric  Referring Physician: Lacretia Leigh, MD  [name] is an 84 y.o. female.  HPI:  [name] is a 84 y.o. female with medical history significant for hypertension, hyperlipidemia, diabetes mellitus, stroke, CHF with EF of 30%, complete heart block, pacemaker placement, CAD, pulmonary hypertension, CKD 3, who presents with a fall and left hip pain.  X-rays revealed a left hip fracture.  Dr. Mayer Camel was consulted for surgical intervention.  Past Medical History:  Diagnosis Date  . Arthritis    "left wrist; back" (07/29/2013)  . Cancer [facility]) Sept. 2015   Left Hand  -  Skin Cancer  . Carotid stenosis    occluded right intracranial ICA followed by Dr. Donnetta Hutching.  1-39% left ICA stenosis by dopplers 10/2017  . Chronic combined systolic and diastolic CHF (congestive heart failure) ([place])   . Chronic kidney disease, unspecified   . Complete heart block ([place]) 07/29/2013   a. s/p MDT dual chamber PPM  . Coronary artery disease    s/p CABG  . Hyperlipidemia   . Hypertension   . Ischemic cardiomyopathy     s/p MDT single chamber ICD 2006.  EF improved and device was downgraded to dual chamber PPM 2014.  EF normalized by echo 2014 and at time of battery changeout ICD changed to PPM.  EF then decreased to 20-25% by echo 04/2016.  Seen in AHF clinic and plan was for not pursue cath given her lack of sx and advanced age. Her Delene Loll was increased and it was decided that she was too high risk for CRTP   . Osteoporosis   . Overactive bladder   . Pacemaker   . Postoperative atrial fibrillation ([place])   . Pulmonary HTN ([place])    severe by echo 04/2016 - likely Group 2 from pulmonary venous HTN secondary to LV dysfunction and MR  . Severe mitral regurgitation    s/p MV ring.  Moderate MR and mild mitral stenosis on echo 04/2016  . Stroke ([place]) 07/2005  . Type II diabetes mellitus ([place])     Past Surgical History:  Procedure Laterality Date  .  APPENDECTOMY  1949  . CARDIAC DEFIBRILLATOR PLACEMENT  11/2005   by Dr Leonia Reeves (MDT) she has a [phone] fidelis lead  . CATARACT EXTRACTION W/ INTRAOCULAR LENS  IMPLANT, BILATERAL Bilateral 1980's  . CORONARY ARTERY BYPASS GRAFT  07/2005   "CABG X3" (07/29/2013)  . MITRAL VALVE REPLACEMENT  07/2005  . PERMANENT PACEMAKER INSERTION N/A 07/29/2013   upgrade of single chamber ICD to Medtronic Adapta L DR implanted with new RA and RV leads inserted  . SALPINGOOPHORECTOMY Bilateral 1976  . SHOULDER ARTHROSCOPY W/ ROTATOR CUFF REPAIR Right 1998  . TOTAL HIP ARTHROPLASTY Right 2001    Family History  Problem Relation Age of Onset  . Cancer Mother        Theadora Rama   . Heart disease Mother        Before age 24  . Heart disease Sister        Before age 23-  CABG  . Cancer Sister        Lonia Chimera  . Diabetes Sister   . Hyperlipidemia Sister   . Heart attack Sister   . Diabetes Daughter   . Coronary artery disease Unknown        family hx of  . Hypertension Unknown        family hx of  . Diabetes Unknown  family hx of    Social History:  reports that she has never smoked. She has never used smokeless tobacco. She reports that she does not drink alcohol or use drugs.  Allergies:  Allergies  Allergen Reactions  . Fosamax [Alendronate] Anaphylaxis  . Horse-Derived Products Hives  . Clonidine Derivatives Other (See Comments)    Unknown reaction  . Darvocet [Propoxyphene N-Acetaminophen] Nausea And Vomiting  . Lipitor [Atorvastatin] Swelling  . Sulfa Drugs Cross Reactors Hives and Itching  . Tramadol Nausea And Vomiting  . Vicodin [Hydrocodone-Acetaminophen] Other (See Comments)    lightheaded  . Penicillins Rash    Has patient had a PCN reaction causing immediate rash, facial/tongue/throat swelling, SOB or lightheadedness with hypotension: Yes Has patient had a PCN reaction causing severe rash involving mucus membranes or skin necrosis: No Has patient had a PCN reaction that required  hospitalization Yes Has patient had a PCN reaction occurring within the last 10 years: No If all of the above answers are "NO", then may proceed with Cephalosporin  . Septra [Sulfamethoxazole-Trimethoprim] Rash    Medications: I have reviewed the patient's current medications.  Results for orders placed or performed during the hospital encounter of 08/19/18 (from the past 48 hour(s))  Comprehensive metabolic panel     Status: Abnormal   Collection Time: 08/19/18  5:15 PM  Result Value Ref Range   Sodium 141 135 - 145 mmol/L   Potassium 4.4 3.5 - 5.1 mmol/L   Chloride 110 98 - 111 mmol/L   CO2 23 22 - 32 mmol/L   Glucose, Bld 230 (H) 70 - 99 mg/dL   BUN 27 (H) 8 - 23 mg/dL   Creatinine, Ser 1.33 (H) 0.44 - 1.00 mg/dL   Calcium 9.4 8.9 - 10.3 mg/dL   Total Protein 5.7 (L) 6.5 - 8.1 g/dL   Albumin 3.2 (L) 3.5 - 5.0 g/dL   AST 57 (H) 15 - 41 U/L   ALT 24 0 - 44 U/L   Alkaline Phosphatase 63 38 - 126 U/L   Total Bilirubin 1.3 (H) 0.3 - 1.2 mg/dL   GFR calc non Af Amer 35 (L) >60 mL/min   GFR calc Af Amer 40 (L) >60 mL/min    Comment: (NOTE) The eGFR has been calculated using the CKD EPI equation. This calculation has not been validated in all clinical situations. eGFR's persistently <60 mL/min signify possible Chronic Kidney Disease.    Anion gap 8 5 - 15    Comment: Performed at [facility] [address], [place], [place] [address]  CBC with Differential     Status: Abnormal   Collection Time: 08/19/18  5:15 PM  Result Value Ref Range   WBC 10.0 4.0 - 10.5 K/uL   RBC 2.85 (L) 3.87 - 5.11 MIL/uL   Hemoglobin 9.4 (L) 12.0 - 15.0 g/dL   HCT 29.1 (L) 36.0 - 46.0 %   MCV 102.1 (H) 78.0 - 100.0 fL   MCH 33.0 26.0 - 34.0 pg   MCHC 32.3 30.0 - 36.0 g/dL   RDW 12.4 11.5 - 15.5 %   Platelets 170 150 - 400 K/uL   Neutrophils Relative % 78 %   Lymphocytes Relative 10 %   Monocytes Relative 10 %   Eosinophils Relative 2 %   Basophils Relative 0 %   Neutro Abs 7.8 (H) 1.7  - 7.7 K/uL   Lymphs Abs 1.0 0.7 - 4.0 K/uL   Monocytes Absolute 1.0 0.1 - 1.0 K/uL  Eosinophils Absolute 0.2 0.0 - 0.7 K/uL   Basophils Absolute 0.0 0.0 - 0.1 K/uL   Smear Review MORPHOLOGY UNREMARKABLE     Comment: Performed at [facility], [place] [address], [place], [place] [address]  CK     Status: Abnormal   Collection Time: 08/19/18  5:15 PM  Result Value Ref Range   Total CK 1,555 (H) 38 - 234 U/L    Comment: Performed at [facility], [place] [address] [place], [place] [address]  Type and screen [place]     Status: None   Collection Time: 08/19/18  5:15 PM  Result Value Ref Range   ABO/RH(D) O POS    Antibody Screen NEG    Sample Expiration      08/22/2018 Performed at [facility], [place] [address], [place], [place] [address]   ABO/Rh     Status: None   Collection Time: 08/19/18  5:15 PM  Result Value Ref Range   ABO/RH(D)      O POS Performed at [facility] [address], [place], [place] [address]   CBG monitoring, ED     Status: Abnormal   Collection Time: 08/19/18  9:35 PM  Result Value Ref Range   Glucose-Capillary 262 (H) 70 - 99 mg/dL  Glucose, capillary     Status: Abnormal   Collection Time: 08/19/18 10:40 PM  Result Value Ref Range   Glucose-Capillary 254 (H) 70 - 99 mg/dL  ABO/Rh     Status: None   Collection Time: 08/19/18 10:47 PM  Result Value Ref Range   ABO/RH(D)      O POS Performed at [facility] [address] [place], [place] [address]   Type and screen [place]     Status: None   Collection Time: 08/19/18 10:51 PM  Result Value Ref Range   ABO/RH(D) O POS    Antibody Screen NEG    Sample Expiration      08/22/2018 Performed at [facility], [place] [address] [place], [place] [address]   Surgical PCR screen     Status: None   Collection Time: 08/20/18  1:22 AM  Result Value Ref Range   MRSA, PCR NEGATIVE NEGATIVE   Staphylococcus  aureus NEGATIVE NEGATIVE    Comment: (NOTE) The Xpert SA Assay (FDA approved for NASAL specimens in patients 68 years of age and older), is one component of a comprehensive surveillance program. It is not intended to diagnose infection nor to guide or monitor treatment. Performed at [facility], [place] [address] [place],  [address]     Dg Chest 1 View  Result Date: 08/19/2018 CLINICAL DATA:  Patient fell last evening at 1839 hours and was found on the floor today. Syncopal episode. EXAM: CHEST  1 VIEW COMPARISON:  11/17/2015 FINDINGS: Heart and mediastinal contours are stable and within normal limits with post CABG change and mitral valvular replacement. Mild aortic atherosclerosis without aneurysm. ICD device projects over the left axilla with leads in the right atrium and right ventricle. Lungs are clear without acute pulmonary consolidations. Osteoarthritis of the right glenohumeral joint with high-riding humeral heads bilaterally. Remodeled appearance of the undersurface a the distal right clavicle. Findings likely reflect chronic rotator cuff tear. Differential possibility may include rheumatoid arthritis. IMPRESSION: No active cardiopulmonary disease. Aortic atherosclerosis. Electronically Signed   By: Ashley Royalty M.D.   On: 08/19/2018 18:11   Ct  Head Wo Contrast  Result Date: 08/19/2018 CLINICAL DATA:  Patient slipped and fell and was found on the floor. EXAM: CT HEAD WITHOUT CONTRAST CT CERVICAL SPINE WITHOUT CONTRAST TECHNIQUE: Multidetector CT imaging of the head and cervical spine was performed following the standard protocol without intravenous contrast. Multiplanar CT image reconstructions of the cervical spine were also generated. COMPARISON:  11/18/2017 and 11/16/2015 FINDINGS: CT HEAD FINDINGS BRAIN: There is mild to moderate sulcal and ventricular prominence consistent with superficial and central atrophy. No intraparenchymal hemorrhage, mass effect nor  midline shift. Periventricular and subcortical white matter hypodensities consistent with mild-to-moderate chronic small vessel ischemic disease are identified. No acute large vascular territory infarcts. No abnormal extra-axial fluid collections. Basal cisterns are not effaced and midline. Intact brainstem and cerebellum. VASCULAR: Moderate calcific atherosclerosis of the carotid siphons. SKULL: No skull fracture. No significant scalp soft tissue swelling. SINUSES/ORBITS: The mastoid air-cells are clear. The included paranasal sinuses are well-aerated.The included ocular globes and orbital contents are non-suspicious. OTHER: None. CT CERVICAL SPINE FINDINGS Alignment: There is maintained cervical lordosis Skull base and vertebrae: Osteoarthritis of the atlantodental interval with soft tissue pannus posterior to the odontoid process and subcortical erosive change is redemonstrated. Soft tissues and spinal canal: No prevertebral fluid or swelling. No visible canal hematoma. Disc levels: Minimal anterolisthesis grade 1 of C3 on C4. Moderate disc flattening at C5-6 and C6-7. Bilateral uncovertebral joint osteoarthritis is seen at C3-4, C4-5 to a greater extent C5-6 and C6-7. Multilevel degenerative facet arthropathy is seen. Small central disc protrusion C2-3. Central to right central disc bulge at C3-4, mild broad-based disc bulge C4-5, and disc-osteophyte complex at C5-6. Upper chest: Negative. Other: None IMPRESSION: Head CT: 1. Atrophy with chronic small vessel ischemia. No acute intracranial abnormality. 2. Cervical spondylosis without acute cervical spine fracture. Multilevel degenerative disc and facet arthropathy. Electronically Signed   By: Ashley Royalty M.D.   On: 08/19/2018 18:30   Ct Cervical Spine Wo Contrast  Result Date: 08/19/2018 CLINICAL DATA:  Patient slipped and fell and was found on the floor. EXAM: CT HEAD WITHOUT CONTRAST CT CERVICAL SPINE WITHOUT CONTRAST TECHNIQUE: Multidetector CT imaging of  the head and cervical spine was performed following the standard protocol without intravenous contrast. Multiplanar CT image reconstructions of the cervical spine were also generated. COMPARISON:  11/18/2017 and 11/16/2015 FINDINGS: CT HEAD FINDINGS BRAIN: There is mild to moderate sulcal and ventricular prominence consistent with superficial and central atrophy. No intraparenchymal hemorrhage, mass effect nor midline shift. Periventricular and subcortical white matter hypodensities consistent with mild-to-moderate chronic small vessel ischemic disease are identified. No acute large vascular territory infarcts. No abnormal extra-axial fluid collections. Basal cisterns are not effaced and midline. Intact brainstem and cerebellum. VASCULAR: Moderate calcific atherosclerosis of the carotid siphons. SKULL: No skull fracture. No significant scalp soft tissue swelling. SINUSES/ORBITS: The mastoid air-cells are clear. The included paranasal sinuses are well-aerated.The included ocular globes and orbital contents are non-suspicious. OTHER: None. CT CERVICAL SPINE FINDINGS Alignment: There is maintained cervical lordosis Skull base and vertebrae: Osteoarthritis of the atlantodental interval with soft tissue pannus posterior to the odontoid process and subcortical erosive change is redemonstrated. Soft tissues and spinal canal: No prevertebral fluid or swelling. No visible canal hematoma. Disc levels: Minimal anterolisthesis grade 1 of C3 on C4. Moderate disc flattening at C5-6 and C6-7. Bilateral uncovertebral joint osteoarthritis is seen at C3-4, C4-5 to a greater extent C5-6 and C6-7. Multilevel degenerative facet arthropathy is seen. Small central disc protrusion C2-3. Central to right  central disc bulge at C3-4, mild broad-based disc bulge C4-5, and disc-osteophyte complex at C5-6. Upper chest: Negative. Other: None IMPRESSION: Head CT: 1. Atrophy with chronic small vessel ischemia. No acute intracranial abnormality. 2.  Cervical spondylosis without acute cervical spine fracture. Multilevel degenerative disc and facet arthropathy. Electronically Signed   By: Ashley Royalty M.D.   On: 08/19/2018 18:30   Dg Hip Unilat With Pelvis 2-3 Views Left  Result Date: 08/19/2018 CLINICAL DATA:  Patient fell last evening was found on the floor today. Foreshortened left leg. EXAM: DG HIP (WITH OR WITHOUT PELVIS) 2-3V LEFT COMPARISON:  None. FINDINGS: AP view the pelvis as well as AP and frog-leg views of the left hip were provided. There is a comminuted, closed subtrochanteric fracture of the left femur with lateral displacement of the main fracture fragment by 1 shaft width and 1/2 shaft width medial displacement of a fracture fragment that includes a portion of the lesser trochanter. A segmental fracture fragment is seen superimposed over the proximal femoral diaphysis measuring 6.1 x 3.4 cm. No acute fracture of the right hip with cemented right total hip arthroplasty. The bony pelvis appears intact. IMPRESSION: There is a comminuted, closed subtrochanteric fracture of the left femur with lateral displacement of the main fracture fragment by 1 shaft width and 1/2 shaft width medial displacement of a fracture fragment that includes a portion of the lesser trochanter. A segmental fracture fragment is seen superimposed over the proximal femoral diaphysis measuring 6.1 x 3.4 cm Intact right total hip arthroplasty. Electronically Signed   By: Ashley Royalty M.D.   On: 08/19/2018 18:19    Review of Systems  Constitutional: Negative.   HENT: Positive for hearing loss.   Eyes: Negative.   Respiratory: Negative.   Cardiovascular: Negative.   Gastrointestinal: Negative.   Genitourinary: Negative.        CKD  Musculoskeletal: Positive for falls and joint pain.  Skin: Negative.   Neurological: Negative.   Psychiatric/Behavioral: Negative.    Blood pressure (!) 118/45, pulse (!) 59, temperature 98.3 F (36.8 C), temperature source Oral,  resp. rate 18, height 5' (1.524 m), weight 58.8 kg, SpO2 100 %. Physical Exam  Constitutional: She is oriented to person, place, and time. She appears well-developed and well-nourished.  HENT:  Head: Normocephalic and atraumatic.  Eyes: Pupils are equal, round, and reactive to light.  Neck: Normal range of motion. Neck supple.  Cardiovascular: Intact distal pulses.  Respiratory: Effort normal.  Musculoskeletal: She exhibits tenderness and deformity.  Pt's left leg is shortened and externally rotated.  She is able to wiggle her toes and is neurovascularly intact  Neurological: She is alert and oriented to person, place, and time.  Skin: Skin is warm and dry.  Psychiatric: She has a normal mood and affect. Her behavior is normal. Judgment and thought content normal.    Assessment/Plan: Closed comminuted subtrochanteric fracture of the left femur   Plan is for ORIF with femoral nail later this afternoon.  The benefits and risks of surgery were discussed and all questions were answered.  She will need a short hospital stay and most likely transfer to SNF afterwards to work on transfers and ambulation.  She was living independently prior to her fall.  Joanell Rising 08/20/2018, 7:01 AM

## 2018-08-20 NOTE — Anesthesia Postprocedure Evaluation (Signed)
Anesthesia Post Note  Patient: [name]  Procedure(s) Performed: INTRAMEDULLARY (IM) NAIL FEMORAL (Left Hip)     Patient location during evaluation: PACU Anesthesia Type: General Level of consciousness: awake and alert Pain management: pain level controlled Vital Signs Assessment: post-procedure vital signs reviewed and stable Respiratory status: spontaneous breathing, nonlabored ventilation, respiratory function stable and patient connected to nasal cannula oxygen Cardiovascular status: blood pressure returned to baseline and stable Postop Assessment: no apparent nausea or vomiting Anesthetic complications: no    Last Vitals:  Vitals:   08/20/18 1830 08/20/18 1833  BP: (!) 96/39 (!) 100/47  Pulse: 65 73  Resp: 16 17  Temp:  (!) 36.3 C  SpO2: 96% 94%    Last Pain:  Vitals:   08/20/18 0756  TempSrc:   PainSc: 8                  [name] [name]

## 2018-08-20 NOTE — Consult Note (Addendum)
Cardiology Consultation:   Patient ID: [name]; [id number]; 01-18-1928   Admit date: 08/19/2018 Date of Consult: 08/20/2018  Primary Care Provider: Mayra Neer, MD Primary Cardiologist: Fransico Him, MD  Primary Electrophysiologist:     Patient Profile:   [name] is a 84 y.o. female with a hx of ischemic dilated cardiomyopathy, chronic systolic and diastolic heart failure, CAD status post CABG, carotid artery stenosis (RICA occlused), dyslipidemia, HTN, mitral stenosis status post MVR, complete heart block status post pacemaker placement, paroxysmal A. fib with last pacer check 06/09/18 with good function, not on anticoagulation, CKD stage III who is being seen today for the evaluation of preoperative clearance at the request of jDr. Patrecia Pour.  History of Present Illness:   [name] was last seen in clinic on 02/20/2018.  At that time she was doing well and was euvolemic on exam.  She had a negative nuclear stress test 05/2016.  She has maintained on 81 mg of aspirin and beta-blocker.  She is statin intolerant.  She initially had a reduced EF, found to be improved on echo 2014 to 55%, but was again reduced by echo 2017 - LVEF 20-25%. She has been seen in the advanced heart failure clinic and started on entresto. She was deemed too high risk for CRT upgrade given her advanced age and frailty. Last echo 06/06/17 with EF of 30-35%, grade 1 DD, mild mitral stenosis and left atrial enlargement.    She presented to [facility] after a fall and complaining of left hip pain. She was found to have left hip fracture. Cardiology was asked to consult for preoperative evaluation. She was getting ready for bed and missed the chair landing on the floor. She was in the floor for a prolonged period of time. She denied loss of consciousness. She was found to have CK 1555. CT head negative for acute intracranial abnormalities. CXR without signs of heart failure.   On my interview, she denies chest pain,  shortness of breath, orthopnea, lower extremity edema, and palpitations. She has been doing well from a cardiac standpoint. She states she was in the floor from 6:30pm to 4:30 pm the next day.    Past Medical History:  Diagnosis Date  . Arthritis    "left wrist; back" (07/29/2013)  . Cancer [facility]) Sept. 2015   Left Hand  -  Skin Cancer  . Carotid stenosis    occluded right intracranial ICA followed by Dr. Donnetta Hutching.  1-39% left ICA stenosis by dopplers 10/2017  . Chronic combined systolic and diastolic CHF (congestive heart failure) ([place])   . Chronic kidney disease, unspecified   . Complete heart block ([place]) 07/29/2013   a. s/p MDT dual chamber PPM  . Coronary artery disease    s/p CABG  . Hyperlipidemia   . Hypertension   . Ischemic cardiomyopathy     s/p MDT single chamber ICD 2006.  EF improved and device was downgraded to dual chamber PPM 2014.  EF normalized by echo 2014 and at time of battery changeout ICD changed to PPM.  EF then decreased to 20-25% by echo 04/2016.  Seen in AHF clinic and plan was for not pursue cath given her lack of sx and advanced age. Her Delene Loll was increased and it was decided that she was too high risk for CRTP   . Osteoporosis   . Overactive bladder   . Pacemaker   . Postoperative atrial fibrillation ([place])   . Pulmonary HTN (HCC)    severe  by echo 04/2016 - likely Group 2 from pulmonary venous HTN secondary to LV dysfunction and MR  . Severe mitral regurgitation    s/p MV ring.  Moderate MR and mild mitral stenosis on echo 04/2016  . Stroke ([place]) 07/2005  . Type II diabetes mellitus ([place])     Past Surgical History:  Procedure Laterality Date  . APPENDECTOMY  1949  . CARDIAC DEFIBRILLATOR PLACEMENT  11/2005   by Dr Leonia Reeves (MDT) she has a [phone] fidelis lead  . CATARACT EXTRACTION W/ INTRAOCULAR LENS  IMPLANT, BILATERAL Bilateral 1980's  . CORONARY ARTERY BYPASS GRAFT  07/2005   "CABG X3" (07/29/2013)  . MITRAL VALVE REPLACEMENT  07/2005  . PERMANENT  PACEMAKER INSERTION N/A 07/29/2013   upgrade of single chamber ICD to Medtronic Adapta L DR implanted with new RA and RV leads inserted  . SALPINGOOPHORECTOMY Bilateral 1976  . SHOULDER ARTHROSCOPY W/ ROTATOR CUFF REPAIR Right 1998  . TOTAL HIP ARTHROPLASTY Right 2001     Home Medications:  Prior to Admission medications   Medication Sig Start Date End Date Taking? Authorizing Provider  acetaminophen (TYLENOL) 500 MG tablet Take 500 mg by mouth daily as needed (pain).    Yes [provider]  aspirin EC 81 MG tablet Take 81 mg by mouth daily.    Yes [provider]  carvedilol (COREG) 12.5 MG tablet TAKE 1 TABLET (12.5 MG TOTAL) BY MOUTH 2 (TWO) TIMES DAILY. 06/03/18  Yes Turner, Eber Hong, MD  Cholecalciferol (VITAMIN D) 2000 UNITS tablet Take 2,000 Units by mouth daily.    Yes [provider]  fish oil-omega-3 fatty acids 1000 MG capsule Take 1 g by mouth 2 (two) times daily.    Yes [provider]  furosemide (LASIX) 40 MG tablet Take 40 mg by mouth daily as needed for fluid or edema.  07/06/18  Yes [provider]  glipiZIDE (GLUCOTROL) 10 MG tablet Take 20 mg by mouth daily. 06/13/18  Yes [provider]  Multiple Vitamin (MULTIVITAMIN WITH MINERALS) TABS tablet Take 1 tablet by mouth daily. Centrum Silver   Yes [provider]  oxybutynin (DITROPAN) 5 MG tablet Take 5 mg by mouth 2 (two) times daily.  05/02/15  Yes [provider]  oxymetazoline (AFRIN) 0.05 % nasal spray Place 1 spray into both nostrils daily as needed for congestion (seasonal allergies).   Yes [provider]  sacubitril-valsartan (ENTRESTO) 49-51 MG Take 1 tablet by mouth 2 (two) times daily. 07/07/18  Yes Larey Dresser, MD  vitamin B-12 (CYANOCOBALAMIN) 1000 MCG tablet Take 1,000 mcg by mouth daily.   Yes [provider]  ENTRESTO 49-51 MG TAKE 1 TABLET BY MOUTH TWICE A DAY Patient not taking: Reported on 08/19/2018 08/18/18   Larey Dresser, MD  lidocaine (LIDODERM) 5 % Place 1 patch onto the skin daily. Remove & Discard patch within 12 hours or as directed by MD Patient not taking: Reported on 08/19/2018 08/21/17   Tegeler, Gwenyth Allegra, MD    Inpatient Medications: Scheduled Meds: . aspirin EC  81 mg Oral Daily  . carvedilol  12.5 mg Oral BID  . cholecalciferol  2,000 Units Oral Daily  . insulin aspart  0-5 Units Subcutaneous QHS  . insulin aspart  0-9 Units Subcutaneous TID WC  . multivitamin with minerals  1 tablet Oral Daily  . mupirocin ointment  1 application Nasal BID  . omega-3 acid ethyl esters  1 g Oral BID  . oxybutynin  5  mg Oral BID  . sacubitril-valsartan  1 tablet Oral BID  . vitamin B-12  1,000 mcg Oral Daily   Continuous Infusions: . sodium chloride 50 mL/hr at 08/19/18 2233  . dextrose 5 % and 0.45% NaCl     PRN Meds: acetaminophen, hydrALAZINE, methocarbamol, morphine injection, ondansetron (ZOFRAN) IV, oxymetazoline, polyethylene glycol, zolpidem  Allergies:    Allergies  Allergen Reactions  . Fosamax [Alendronate] Anaphylaxis  . Horse-Derived Products Hives  . Clonidine Derivatives Other (See Comments)    Unknown reaction  . Darvocet [Propoxyphene N-Acetaminophen] Nausea And Vomiting  . Lipitor [Atorvastatin] Swelling  . Sulfa Drugs Cross Reactors Hives and Itching  . Tramadol Nausea And Vomiting  . Vicodin [Hydrocodone-Acetaminophen] Other (See Comments)    lightheaded  . Penicillins Rash    Has patient had a PCN reaction causing immediate rash, facial/tongue/throat swelling, SOB or lightheadedness with hypotension: Yes Has patient had a PCN reaction causing severe rash involving mucus membranes or skin necrosis: No Has patient had a PCN reaction that required hospitalization Yes Has patient had a PCN reaction occurring within the last 10 years: No If all of the above answers are "NO", then may proceed with Cephalosporin  . Septra [Sulfamethoxazole-Trimethoprim] Rash     Social History:   Social History   Socioeconomic History  . Marital status: Unknown    Spouse name: Not on file  . Number of children: Not on file  . Years of education: Not on file  . Highest education level: Not on file  Occupational History  . Occupation: retired Marine scientist  Social Needs  . Financial resource strain: Patient refused  . Food insecurity:    Worry: Patient refused    Inability: Patient refused  . Transportation needs:    Medical: Patient refused    Non-medical: Patient refused  Tobacco Use  . Smoking status: Never Smoker  . Smokeless tobacco: Never Used  Substance and Sexual Activity  . Alcohol use: No    Alcohol/week: 0.0 standard drinks  . Drug use: No  . Sexual activity: Never  Lifestyle  . Physical activity:    Days per week: Patient refused    Minutes per session: Patient refused  . Stress: Patient refused  Relationships  . Social connections:    Talks on phone: Patient refused    Gets together: Patient refused    Attends religious service: Patient refused    Active member of club or organization: Not on file    Attends meetings of clubs or organizations: Patient refused    Relationship status: Patient refused  . Intimate partner violence:    Fear of current or ex partner: Patient refused    Emotionally abused: Patient refused    Physically abused: Patient refused    Forced sexual activity: Patient refused  Other Topics Concern  . Not on file  Social History Narrative  . Not on file    Family History:    Family History  Problem Relation Age of Onset  . Cancer Mother        Theadora Rama   . Heart disease Mother        Before age 51  . Heart disease Sister        Before age 46-  CABG  . Cancer Sister        Lonia Chimera  . Diabetes Sister   . Hyperlipidemia Sister   . Heart attack Sister   . Diabetes Daughter   . Coronary artery disease Unknown  family hx of  . Hypertension Unknown        family hx of  . Diabetes Unknown         family hx of     ROS:  Please see the history of present illness.   All other ROS reviewed and negative.     Physical Exam/Data:   Vitals:   08/19/18 2100 08/19/18 2233 08/19/18 2315 08/20/18 0404  BP:  (!) 134/52 (!) 135/52 (!) 118/45  Pulse: 79 66 62 (!) 59  Resp: 20 20  18   Temp:  98.8 F (37.1 C)  98.3 F (36.8 C)  TempSrc:  Oral  Oral  SpO2: 99% 98%  100%  Weight:   58.8 kg   Height:   5' (1.524 m)     Intake/Output Summary (Last 24 hours) at 08/20/2018 0743 Last data filed at 08/20/2018 0600 Gross per 24 hour  Intake 393.98 ml  Output 100 ml  Net 293.98 ml   Filed Weights   08/19/18 1656 08/19/18 2315  Weight: 59 kg 58.8 kg   Body mass index is 25.32 kg/m.  General:  Well nourished, well developed, in no acute distress HEENT: normal Neck: no JVD Cardiac:  normal S1, S2; RRR; no murmur, PPM upper left chest Lungs:  clear to auscultation bilaterally, no wheezing, rhonchi or rales  Abd: soft, nontender, no hepatomegaly  Ext: no edema Musculoskeletal:  left hip fracture Skin: warm and dry  Neuro:  CNs 2-12 intact, no focal abnormalities noted Psych:  Normal affect   EKG:  The EKG was personally reviewed and demonstrates:  Junctional rhythm Telemetry:  Telemetry was personally reviewed and demonstrates:  Paced rhythm  Relevant CV Studies:  Echo 06/06/17: Study Conclusions - Left ventricle: The cavity size was normal. Systolic function was   moderately to severely reduced. The estimated ejection fraction   was in the range of 30% to 35%. Images were inadequate for LV   wall motion assessment. Doppler parameters are consistent with   abnormal left ventricular relaxation (grade 1 diastolic   dysfunction). Doppler parameters are consistent with high   ventricular filling pressure. - Mitral valve: Severely calcified annulus. Moderate diffuse   thickening of the anterior leaflet and posterior leaflet, with   moderate involvement of chords. The findings are  consistent with   mild stenosis. - Left atrium: The atrium was mildly dilated.  Impressions: - Recommend definity contrast injection to adequately assess EF and   wall motion.  Laboratory Data:  Chemistry Recent Labs  Lab 08/19/18 1715  NA 141  K 4.4  CL 110  CO2 23  GLUCOSE 230*  BUN 27*  CREATININE 1.33*  CALCIUM 9.4  GFRNONAA 35*  GFRAA 40*  ANIONGAP 8    Recent Labs  Lab 08/19/18 1715  PROT 5.7*  ALBUMIN 3.2*  AST 57*  ALT 24  ALKPHOS 63  BILITOT 1.3*   Hematology Recent Labs  Lab 08/19/18 1715  WBC 10.0  RBC 2.85*  HGB 9.4*  HCT 29.1*  MCV 102.1*  MCH 33.0  MCHC 32.3  RDW 12.4  PLT 170   Cardiac EnzymesNo results for input(s): TROPONINI in the last 168 hours. No results for input(s): TROPIPOC in the last 168 hours.  BNPNo results for input(s): BNP, PROBNP in the last 168 hours.  DDimer No results for input(s): DDIMER in the last 168 hours.  Radiology/Studies:  Dg Chest 1 View  Result Date: 08/19/2018 CLINICAL DATA:  Patient fell last evening at 1839 hours and  was found on the floor today. Syncopal episode. EXAM: CHEST  1 VIEW COMPARISON:  11/17/2015 FINDINGS: Heart and mediastinal contours are stable and within normal limits with post CABG change and mitral valvular replacement. Mild aortic atherosclerosis without aneurysm. ICD device projects over the left axilla with leads in the right atrium and right ventricle. Lungs are clear without acute pulmonary consolidations. Osteoarthritis of the right glenohumeral joint with high-riding humeral heads bilaterally. Remodeled appearance of the undersurface a the distal right clavicle. Findings likely reflect chronic rotator cuff tear. Differential possibility may include rheumatoid arthritis. IMPRESSION: No active cardiopulmonary disease. Aortic atherosclerosis. Electronically Signed   By: Ashley Royalty M.D.   On: 08/19/2018 18:11   Ct Head Wo Contrast  Result Date: 08/19/2018 CLINICAL DATA:  Patient slipped  and fell and was found on the floor. EXAM: CT HEAD WITHOUT CONTRAST CT CERVICAL SPINE WITHOUT CONTRAST TECHNIQUE: Multidetector CT imaging of the head and cervical spine was performed following the standard protocol without intravenous contrast. Multiplanar CT image reconstructions of the cervical spine were also generated. COMPARISON:  11/18/2017 and 11/16/2015 FINDINGS: CT HEAD FINDINGS BRAIN: There is mild to moderate sulcal and ventricular prominence consistent with superficial and central atrophy. No intraparenchymal hemorrhage, mass effect nor midline shift. Periventricular and subcortical white matter hypodensities consistent with mild-to-moderate chronic small vessel ischemic disease are identified. No acute large vascular territory infarcts. No abnormal extra-axial fluid collections. Basal cisterns are not effaced and midline. Intact brainstem and cerebellum. VASCULAR: Moderate calcific atherosclerosis of the carotid siphons. SKULL: No skull fracture. No significant scalp soft tissue swelling. SINUSES/ORBITS: The mastoid air-cells are clear. The included paranasal sinuses are well-aerated.The included ocular globes and orbital contents are non-suspicious. OTHER: None. CT CERVICAL SPINE FINDINGS Alignment: There is maintained cervical lordosis Skull base and vertebrae: Osteoarthritis of the atlantodental interval with soft tissue pannus posterior to the odontoid process and subcortical erosive change is redemonstrated. Soft tissues and spinal canal: No prevertebral fluid or swelling. No visible canal hematoma. Disc levels: Minimal anterolisthesis grade 1 of C3 on C4. Moderate disc flattening at C5-6 and C6-7. Bilateral uncovertebral joint osteoarthritis is seen at C3-4, C4-5 to a greater extent C5-6 and C6-7. Multilevel degenerative facet arthropathy is seen. Small central disc protrusion C2-3. Central to right central disc bulge at C3-4, mild broad-based disc bulge C4-5, and disc-osteophyte complex at C5-6.  Upper chest: Negative. Other: None IMPRESSION: Head CT: 1. Atrophy with chronic small vessel ischemia. No acute intracranial abnormality. 2. Cervical spondylosis without acute cervical spine fracture. Multilevel degenerative disc and facet arthropathy. Electronically Signed   By: Ashley Royalty M.D.   On: 08/19/2018 18:30   Ct Cervical Spine Wo Contrast  Result Date: 08/19/2018 CLINICAL DATA:  Patient slipped and fell and was found on the floor. EXAM: CT HEAD WITHOUT CONTRAST CT CERVICAL SPINE WITHOUT CONTRAST TECHNIQUE: Multidetector CT imaging of the head and cervical spine was performed following the standard protocol without intravenous contrast. Multiplanar CT image reconstructions of the cervical spine were also generated. COMPARISON:  11/18/2017 and 11/16/2015 FINDINGS: CT HEAD FINDINGS BRAIN: There is mild to moderate sulcal and ventricular prominence consistent with superficial and central atrophy. No intraparenchymal hemorrhage, mass effect nor midline shift. Periventricular and subcortical white matter hypodensities consistent with mild-to-moderate chronic small vessel ischemic disease are identified. No acute large vascular territory infarcts. No abnormal extra-axial fluid collections. Basal cisterns are not effaced and midline. Intact brainstem and cerebellum. VASCULAR: Moderate calcific atherosclerosis of the carotid siphons. SKULL: No skull fracture. No significant  scalp soft tissue swelling. SINUSES/ORBITS: The mastoid air-cells are clear. The included paranasal sinuses are well-aerated.The included ocular globes and orbital contents are non-suspicious. OTHER: None. CT CERVICAL SPINE FINDINGS Alignment: There is maintained cervical lordosis Skull base and vertebrae: Osteoarthritis of the atlantodental interval with soft tissue pannus posterior to the odontoid process and subcortical erosive change is redemonstrated. Soft tissues and spinal canal: No prevertebral fluid or swelling. No visible canal  hematoma. Disc levels: Minimal anterolisthesis grade 1 of C3 on C4. Moderate disc flattening at C5-6 and C6-7. Bilateral uncovertebral joint osteoarthritis is seen at C3-4, C4-5 to a greater extent C5-6 and C6-7. Multilevel degenerative facet arthropathy is seen. Small central disc protrusion C2-3. Central to right central disc bulge at C3-4, mild broad-based disc bulge C4-5, and disc-osteophyte complex at C5-6. Upper chest: Negative. Other: None IMPRESSION: Head CT: 1. Atrophy with chronic small vessel ischemia. No acute intracranial abnormality. 2. Cervical spondylosis without acute cervical spine fracture. Multilevel degenerative disc and facet arthropathy. Electronically Signed   By: Ashley Royalty M.D.   On: 08/19/2018 18:30   Dg Hip Unilat With Pelvis 2-3 Views Left  Result Date: 08/19/2018 CLINICAL DATA:  Patient fell last evening was found on the floor today. Foreshortened left leg. EXAM: DG HIP (WITH OR WITHOUT PELVIS) 2-3V LEFT COMPARISON:  None. FINDINGS: AP view the pelvis as well as AP and frog-leg views of the left hip were provided. There is a comminuted, closed subtrochanteric fracture of the left femur with lateral displacement of the main fracture fragment by 1 shaft width and 1/2 shaft width medial displacement of a fracture fragment that includes a portion of the lesser trochanter. A segmental fracture fragment is seen superimposed over the proximal femoral diaphysis measuring 6.1 x 3.4 cm. No acute fracture of the right hip with cemented right total hip arthroplasty. The bony pelvis appears intact. IMPRESSION: There is a comminuted, closed subtrochanteric fracture of the left femur with lateral displacement of the main fracture fragment by 1 shaft width and 1/2 shaft width medial displacement of a fracture fragment that includes a portion of the lesser trochanter. A segmental fracture fragment is seen superimposed over the proximal femoral diaphysis measuring 6.1 x 3.4 cm Intact right total hip  arthroplasty. Electronically Signed   By: Ashley Royalty M.D.   On: 08/19/2018 18:19    Assessment and Plan:   1. CAD s/p CABG - pt has done well, maintained on ASA and coreg - statin intolerant  2. MR s/p MV repair - visualized on last echo; mild mitral stenosis  3. Chronic systolic and diastolic heart failure - last EF with 30-35% and grade 1 DD - CXR without edema or effusion - she is euvolemic on exam - on lasix 40 mg and entresto 49-51 - BNP pending  4. Preoperative risk assessment Ms Deutscher has a history of CAD s/p CABG, CVA, chronic combined systolic and diastolic heart failure, and CKD stage III with a creatinine of 1.33. She does not require insulin. The above factors place her at a 15% risk of death, MI, or cardiac arrest. However, she denies anginal symptoms and is euvolemic on exam. Even though these co-morbidities place her at a greater risk for surgery, the benefits of surgery outweigh the risks. We discussed her high risk profile in the setting of a needed surgery. She agrees to proceed with surgery.    For questions or updates, please contact [place] Please consult [email or web address] for contact info under Cardiology/STEMI.   Signed, Levada Dy  Augustin Coupe, [place]  08/20/2018 7:43 AM As above, patient seen and examined.  Briefly she is an 84 year old female with past medical history of ischemic cardiomyopathy, chronic combined systolic/diastolic congestive heart failure, coronary artery disease status post coronary artery bypass and graft, prior pacemaker, prior mitral valve repair, paroxysmal atrial fibrillation admitted following left hip fracture for preoperative evaluation.  Patient has limited mobility but is able to walk to her mailbox.  She has mild dyspnea with these activities but no orthopnea, PND, pedal edema, exertional chest pain or syncope. ECG shows probable atrial paced rhythm with left bundle branch block.  1 preoperative evaluation prior to repair of hip  fracture-given long cardiac history patient will be considered at least intermediate risk.  However she has not had recent increased dyspnea or chest pain.  It also would not be practical to pursue preoperative cardiac testing given left hip fracture.  Her fracture also needs to be repaired or her ability to ambulate in the future may be impaired.  I have explained that her risk is increased and she has accepted that risk.  No plans for preoperative cardiac testing.  2 coronary artery disease-continue aspirin.  3 ischemic cardiomyopathy-continue carvedilol and Entresto at present doses.  4 prior pacemaker  5 history of paroxysmal atrial fibrillation-continue carvedilol.  She is not on anticoagulation because of history of frequent falls.  Kirk Ruths, MD

## 2018-08-20 NOTE — Op Note (Signed)
PREOPERATIVE DIAGNOSIS: Left hip subtrochanteric fracture extending into the diaphysis of the femur   POSTOPERATIVE DIAGNOSIS: Same   PROCEDURE: Open reduction internal fixation of the left hip subtrochanteric fracture and femur fracture using cerclage wires in the femur and a Biomet affixes trochanteric nail 36 cm x 11 mm with an 80 mm lag screw proximally and a single distal locking screw in the slot.    SURGEON: Frederik Pear J   ASSISTANT: Kerry Hough. Barton Dubois (present throughout entire procedure and necessary for timely completion of the procedure) ANESTHESIA: General  BLOOD LOSS: 300 cc  FLUID REPLACEMENT: 1500 cc crystalloid  DRAINS: None COMPLICATIONS: none   INDICATIONS FOR PROCEDURE: 84 year old female fell at home and sustained a badly comminuted left hip subtrochanteric fracture extending well into the diaphysis of the femur with significant comminution. Patient. presented to the emergency room, was admitted by the medicine service and orthopedic consultation was obtained. To decrease pain and increase function we have recommended open reduction internal fixation using locked trochanteric nail, and probable cerclage wires along the long spiral fragments going into the diaphysis.  The patient has significant comorbidities including pulmonary hypertension congestive heart failure and has a cardiac risk of 15% during the perioperative period. the risks, benefits, and alternatives were discussed at length including but not limited to the risks of infection, bleeding, nerve injury, stiffness, blood clots, the need for revision surgery, cardiopulmonary complications, among others, and they were willing to proceed. Benefits have been discussed. Questions answered.   PROCEDURE IN DETAIL: The patient was identified by armband,  received preoperative IV antibiotics in the holding area, taken to the operating room , appropriate anesthetic monitors were attached and general endotracheal  anesthesia induced. Pt. was then transferred to a radiolucent flat Jackson table, rolled into the right lateral decubitus position and fixed there with a Stulberg Mark 2 pelvic clamp. Under C-arm imaging control we then performed a closed reduction with abduction and internal rotation obtaining an acceptable reduction with the leg in full extension. The left lower extremity was then prepped and draped in usual sterile fashion in the iliac crest to the ankle. A timeout procedure was performed. Under C-arm image control a line was drawn coaxially with the femur on the lateral view, a stab wound was made 8 cm proximal to the tip of the greater trochanter along the line that had been drawn, and a guide pin was inserted into the tip of the greater trochanter and then down the shaft of the femur pass intertrochanteric fracture. We then placed the ball-tipped guidewire, overreamed with the Biomet pilot reamer and then flexibly reamed up to 12.5 mm obtaining some early chatter at midshaft. With the ball-tipped guidewire in place we measured for 36 mm x 11 mm Biomet affixes trochanteric nail which was loaded on the inserter placed over the guidewire and down the femur to the appropriate depth that allowed [place] to place the lag bolt into the center of the femoral head, slightly inferior.  Prior to placing the lag screw we carefully visualize the long spiral butterfly fragments with a C arm there was still widely displaced.  We made the decision to go ahead and extend the incision down the leg to mid femur allowing a lateral approach to the femur with reduction of the butterfly fragments using traction and cerclage wires.  Proximal cerclage wire was able to reduce the varus moments on the proximal fragment and also allowed reduction with a major butterfly fragment and the shaft.  The distal  cerclage wire further reduce the large butterfly fragment that went down to mid femur.  We then placed to the proximal lag screw measured  for 80 mm.  With the C-arm set for the lateral view we then made it so that the tip of the guide pin bisected the femoral head and inserted the guide pin through the lateral flare of the inner trochanter fracture through the rod and up into the femoral head using the driving platform guide holes. We measured for a 80 mm lag screw and then reamed over the guidewire with the reamer set for 80 mm. We then loaded a 80 mm Biomet affixes lag screw and inserted it through the lateral flare of the femur through the rod and up into the femoral head to a point it was 8 mm below the subchondral bone on the AP and lateral views. At this point the locking screw was inserted and tightened and then backed off a quarter turn to allow compression. We then directed our attention towards the tip of the rod and using the perfect circle technique inserted a 40 mm locking screw in the distal slot locking hole. Final C-arm images were taken of the construct the hip was taken through flexion and internal and external rotation under image intensification to assure that the lag bolt did not enter the joint. At this point stab wounds and the rod insertion incision were irrigated out normal saline solution.  The incision and the vastus lateralis was closed with running 0 Vicryl suture, the IT band closed with running #1 Vicryl suture, and the subcutaneous tissue closed with 2-0 undyed Vicryl suture and the skin with running 3-0 Vicryl suture.  An Aquacil dressing was then applied the patient was unclamped rolled supine awakened extubated and taken to the recovery room without difficulty. A Foley was placed at the end of the procedure. Frederik Pear J  08/07/2012, 7:29 PM

## 2018-08-20 NOTE — Progress Notes (Signed)
Initial Nutrition Assessment  DOCUMENTATION CODES:   Not applicable  INTERVENTION:    Monitor for diet advancement/toleration  Ensure Enlive po BID, each supplement provides 350 kcal and 20 grams of protein  Magic cup TID with meals, each supplement provides 290 kcal and 9 grams of protein  NUTRITION DIAGNOSIS:   Increased nutrient needs related to hip fracture, post-op healing as evidenced by estimated needs.  GOAL:   Patient will meet greater than or equal to 90% of their needs  MONITOR:   PO intake, Supplement acceptance, Weight trends, Labs, Diet advancement  REASON FOR ASSESSMENT:   Consult Hip fracture protocol  ASSESSMENT:   Patient with PMH significant for HTN, HLD, DM, CVA, CHF, complete heart block, s/p pacemaker, CAD, and CKD III. Presents this admission after a mechanical fall resulting in left hip fracture. Plan for repair 8/21.    Pt denies having a loss in appetite PTA. States she typically eats 3 meals/day that consist of PB sandwich, 1/2 cup cereal with milk, Ensure, vegetables, and sometimes meats. With given dietary recall, suspect pt does not consume high protein food options. Pt endorses having a hard time swallowing but she is unable to describe how it feels. She is currently NPO for her hip repair this afternoon. Discussed the importance of protein intake to aid in post op healing. Pt would like Ensure and may possibly need SLP evaluation. RD made aware.   Pt endorses a UBW of 135 lb and denies any recent wt loss. Records indicate pt weighed 134 lb 02/20/18 and 130 lb this admission (insignificant for time frame). Nutrition-Focused physical exam completed.   Medications reviewed and include: Vit D, MVI with minerals, Lovaza, Vit B12, D5 @ 75 ml/hr Labs reviewed: CBG 199-262 (H)   NUTRITION - FOCUSED PHYSICAL EXAM:    Most Recent Value  Orbital Region  No depletion  Upper Arm Region  No depletion  Thoracic and Lumbar Region  Unable to assess   Buccal Region  Mild depletion  Temple Region  Mild depletion  Clavicle Bone Region  Mild depletion  Clavicle and Acromion Bone Region  Mild depletion  Scapular Bone Region  Unable to assess  Dorsal Hand  Moderate depletion  Patellar Region  No depletion  Anterior Thigh Region  No depletion  Posterior Calf Region  No depletion  Edema (RD Assessment)  Mild  Hair  Reviewed  Eyes  Reviewed  Mouth  Reviewed  Skin  Reviewed  Nails  Reviewed       Diet Order:   Diet Order            Diet NPO time specified Except for: Sips with Meds  Diet effective midnight        Diet NPO time specified Except for: [employer], Sips with Meds  Diet effective midnight              EDUCATION NEEDS:   Education needs have been addressed  Skin:  Skin Assessment: Reviewed RN Assessment  Last BM:  PTA  Height:   Ht Readings from Last 1 Encounters:  08/19/18 5' (1.524 m)    Weight:   Wt Readings from Last 1 Encounters:  08/19/18 58.8 kg    Ideal Body Weight:  45.5 kg  BMI:  Body mass index is 25.32 kg/m.  Estimated Nutritional Needs:   Kcal:  1450-1650 kcal  Protein:  70-85 grams   Fluid:  >/= 1.4 L/day   [name] RD, LDN Clinical Nutrition Pager # -  [phone]  

## 2018-08-20 NOTE — Progress Notes (Signed)
PROGRESS NOTE [facility]   [name]   EXB:[id number] DOB: [birth year]  DOA: 08/19/2018 PCP: Mayra Neer, MD   Brief Narrative:  [name] is a 84 year old female with medical history significant of hypertension, diabetes mellitus type 2, chronic systolic CHF, CAD, pulmonary hypertension, complete heart block status post pacemaker placement, and CKD stage III who presented to the emergency department after mechanical fall.  She was made in the ground for several hours due to inability to move her arm. Upon ED evaluation CK 1555, CT head negative for acute intracranial abnormality.  CT of C-spine did not show any acute bony fracture.  X-ray of the hip comminuted close subtrochanteric fracture of the legs firmer with lateral displacement.  She was admitted with working diagnosis of hip fracture.   Subjective: Patient seen and examined, c/o pain on left hip, no acute events overnight. Unable, to place foley cath.   Assessment & Plan:   Principal Problem:   Closed left hip fracture (HCC) Active Problems:   Hypertension   CKD (chronic kidney disease), stage III (HCC)   Coronary artery disease   Type II diabetes mellitus with renal manifestations (HCC)   Complete heart block (HCC)   Chronic combined systolic and diastolic CHF (congestive heart failure) (HCC)   Fall   Macrocytic anemia  Close left hip fracture Scheduled for hip surgery today.  Cardiology was consulted and has deemed patient intermediate risk. Continue pain management as needed.  Postop instruction per orthopedic surgery  Chronic combined systolic and diastolic CHF Echo on 1/02 showed EF of 30 to 35% with grade 1 diastolic dysfunction.  No signs of fluid overload at this time.  Patient seems to be euvolemic.  Continue Entresto and carvedilol  Rhabdomyolysis Continue gentle hydration  Hypertension Continue home medication  PAF She is not on anticoagulation due to frequent falls, continue  beta-blocker  CKD stage III Creatinine at baseline Avoid nephrotoxic agent and hypertension Check renal function in a.m.  Type 2 diabetes mellitus Holding oral hypoglycemic agents, while n.p.o. Continue SSI, follow-up A1C  DVT prophylaxis: SCD's Code Status: DNR  Family Communication: Family at bedside  Disposition Plan: SNF when cleared by ortho  Consultants:   Ortho   Cardio  Procedures:     Antimicrobials: Anti-infectives (From admission, onward)   Start     Dose/Rate Route Frequency Ordered Stop   08/20/18 1400  vancomycin (VANCOCIN) IVPB 1000 mg/200 mL premix     1,000 mg 200 mL/hr over 60 Minutes Intravenous On call to O.R. 08/20/18 1205 08/20/18 1516           Objective: Vitals:   08/19/18 2315 08/20/18 0404 08/20/18 1106 08/20/18 1128  BP: (!) 135/52 (!) 118/45 117/66   Pulse: 62 (!) 59 79   Resp:  18    Temp:  98.3 F (36.8 C)    TempSrc:  Oral    SpO2:  100% 97% 97%  Weight: 58.8 kg     Height: 5' (1.524 m)       Intake/Output Summary (Last 24 hours) at 08/20/2018 1447 Last data filed at 08/20/2018 0747 Gross per 24 hour  Intake 393.98 ml  Output 100 ml  Net 293.98 ml   Filed Weights   08/19/18 1656 08/19/18 2315  Weight: 59 kg 58.8 kg    Examination:  General exam: Appears calm and comfortable  HEENT: AC/AT, PERRLA, OP moist and clear Respiratory system: Clear to auscultation. No wheezes,crackle or rhonchi Cardiovascular system: S1 & S2 heard,  RRR. No JVD, murmurs, rubs or gallops Gastrointestinal system: Abdomen is nondistended, soft and nontender.  Central nervous system: Alert and oriented. No focal neurological deficits. Extremities: Left leg is shortened and externally rotated. Able to wiggle toes, pulses and sensation intact.  Skin: No rashes Psychiatry: Mood & affect appropriate.    Data Reviewed: I have personally reviewed following labs and imaging studies  CBC: Recent Labs  Lab 08/19/18 1715  WBC 10.0  NEUTROABS  7.8*  HGB 9.4*  HCT 29.1*  MCV 102.1*  PLT 962   Basic Metabolic Panel: Recent Labs  Lab 08/19/18 1715  NA 141  K 4.4  CL 110  CO2 23  GLUCOSE 230*  BUN 27*  CREATININE 1.33*  CALCIUM 9.4   GFR: Estimated Creatinine Clearance: 23.4 mL/min (A) (by C-G formula based on SCr of 1.33 mg/dL (H)). Liver Function Tests: Recent Labs  Lab 08/19/18 1715  AST 57*  ALT 24  ALKPHOS 63  BILITOT 1.3*  PROT 5.7*  ALBUMIN 3.2*   No results for input(s): LIPASE, AMYLASE in the last 168 hours. No results for input(s): AMMONIA in the last 168 hours. Coagulation Profile: No results for input(s): INR, PROTIME in the last 168 hours. Cardiac Enzymes: Recent Labs  Lab 08/19/18 1715  CKTOTAL 1,555*   BNP (last 3 results) No results for input(s): PROBNP in the last 8760 hours. HbA1C: No results for input(s): HGBA1C in the last 72 hours. CBG: Recent Labs  Lab 08/19/18 2135 08/19/18 2240 08/20/18 0738 08/20/18 1126  GLUCAP 262* 254* 199* 210*   Lipid Profile: No results for input(s): CHOL, HDL, LDLCALC, TRIG, CHOLHDL, LDLDIRECT in the last 72 hours. Thyroid Function Tests: No results for input(s): TSH, T4TOTAL, FREET4, T3FREE, THYROIDAB in the last 72 hours. Anemia Panel: No results for input(s): VITAMINB12, FOLATE, FERRITIN, TIBC, IRON, RETICCTPCT in the last 72 hours. Sepsis Labs: No results for input(s): PROCALCITON, LATICACIDVEN in the last 168 hours.  Recent Results (from the past 240 hour(s))  Surgical PCR screen     Status: None   Collection Time: 08/20/18  1:22 AM  Result Value Ref Range Status   MRSA, PCR NEGATIVE NEGATIVE Final   Staphylococcus aureus NEGATIVE NEGATIVE Final    Comment: (NOTE) The Xpert SA Assay (FDA approved for NASAL specimens in patients 15 years of age and older), is one component of a comprehensive surveillance program. It is not intended to diagnose infection nor to guide or monitor treatment. Performed at [facility],  [place] [address] [place], [place] [address]       Radiology Studies: Dg Chest 1 View  Result Date: 08/19/2018 CLINICAL DATA:  Patient fell last evening at 1839 hours and was found on the floor today. Syncopal episode. EXAM: CHEST  1 VIEW COMPARISON:  11/17/2015 FINDINGS: Heart and mediastinal contours are stable and within normal limits with post CABG change and mitral valvular replacement. Mild aortic atherosclerosis without aneurysm. ICD device projects over the left axilla with leads in the right atrium and right ventricle. Lungs are clear without acute pulmonary consolidations. Osteoarthritis of the right glenohumeral joint with high-riding humeral heads bilaterally. Remodeled appearance of the undersurface a the distal right clavicle. Findings likely reflect chronic rotator cuff tear. Differential possibility may include rheumatoid arthritis. IMPRESSION: No active cardiopulmonary disease. Aortic atherosclerosis. Electronically Signed   By: Ashley Royalty M.D.   On: 08/19/2018 18:11   Ct Head Wo Contrast  Result Date: 08/19/2018 CLINICAL DATA:  Patient slipped and fell and was found  on the floor. EXAM: CT HEAD WITHOUT CONTRAST CT CERVICAL SPINE WITHOUT CONTRAST TECHNIQUE: Multidetector CT imaging of the head and cervical spine was performed following the standard protocol without intravenous contrast. Multiplanar CT image reconstructions of the cervical spine were also generated. COMPARISON:  11/18/2017 and 11/16/2015 FINDINGS: CT HEAD FINDINGS BRAIN: There is mild to moderate sulcal and ventricular prominence consistent with superficial and central atrophy. No intraparenchymal hemorrhage, mass effect nor midline shift. Periventricular and subcortical white matter hypodensities consistent with mild-to-moderate chronic small vessel ischemic disease are identified. No acute large vascular territory infarcts. No abnormal extra-axial fluid collections. Basal cisterns are not effaced and midline. Intact  brainstem and cerebellum. VASCULAR: Moderate calcific atherosclerosis of the carotid siphons. SKULL: No skull fracture. No significant scalp soft tissue swelling. SINUSES/ORBITS: The mastoid air-cells are clear. The included paranasal sinuses are well-aerated.The included ocular globes and orbital contents are non-suspicious. OTHER: None. CT CERVICAL SPINE FINDINGS Alignment: There is maintained cervical lordosis Skull base and vertebrae: Osteoarthritis of the atlantodental interval with soft tissue pannus posterior to the odontoid process and subcortical erosive change is redemonstrated. Soft tissues and spinal canal: No prevertebral fluid or swelling. No visible canal hematoma. Disc levels: Minimal anterolisthesis grade 1 of C3 on C4. Moderate disc flattening at C5-6 and C6-7. Bilateral uncovertebral joint osteoarthritis is seen at C3-4, C4-5 to a greater extent C5-6 and C6-7. Multilevel degenerative facet arthropathy is seen. Small central disc protrusion C2-3. Central to right central disc bulge at C3-4, mild broad-based disc bulge C4-5, and disc-osteophyte complex at C5-6. Upper chest: Negative. Other: None IMPRESSION: Head CT: 1. Atrophy with chronic small vessel ischemia. No acute intracranial abnormality. 2. Cervical spondylosis without acute cervical spine fracture. Multilevel degenerative disc and facet arthropathy. Electronically Signed   By: Ashley Royalty M.D.   On: 08/19/2018 18:30   Ct Cervical Spine Wo Contrast  Result Date: 08/19/2018 CLINICAL DATA:  Patient slipped and fell and was found on the floor. EXAM: CT HEAD WITHOUT CONTRAST CT CERVICAL SPINE WITHOUT CONTRAST TECHNIQUE: Multidetector CT imaging of the head and cervical spine was performed following the standard protocol without intravenous contrast. Multiplanar CT image reconstructions of the cervical spine were also generated. COMPARISON:  11/18/2017 and 11/16/2015 FINDINGS: CT HEAD FINDINGS BRAIN: There is mild to moderate sulcal and  ventricular prominence consistent with superficial and central atrophy. No intraparenchymal hemorrhage, mass effect nor midline shift. Periventricular and subcortical white matter hypodensities consistent with mild-to-moderate chronic small vessel ischemic disease are identified. No acute large vascular territory infarcts. No abnormal extra-axial fluid collections. Basal cisterns are not effaced and midline. Intact brainstem and cerebellum. VASCULAR: Moderate calcific atherosclerosis of the carotid siphons. SKULL: No skull fracture. No significant scalp soft tissue swelling. SINUSES/ORBITS: The mastoid air-cells are clear. The included paranasal sinuses are well-aerated.The included ocular globes and orbital contents are non-suspicious. OTHER: None. CT CERVICAL SPINE FINDINGS Alignment: There is maintained cervical lordosis Skull base and vertebrae: Osteoarthritis of the atlantodental interval with soft tissue pannus posterior to the odontoid process and subcortical erosive change is redemonstrated. Soft tissues and spinal canal: No prevertebral fluid or swelling. No visible canal hematoma. Disc levels: Minimal anterolisthesis grade 1 of C3 on C4. Moderate disc flattening at C5-6 and C6-7. Bilateral uncovertebral joint osteoarthritis is seen at C3-4, C4-5 to a greater extent C5-6 and C6-7. Multilevel degenerative facet arthropathy is seen. Small central disc protrusion C2-3. Central to right central disc bulge at C3-4, mild broad-based disc bulge C4-5, and disc-osteophyte complex at C5-6. Upper chest:  Negative. Other: None IMPRESSION: Head CT: 1. Atrophy with chronic small vessel ischemia. No acute intracranial abnormality. 2. Cervical spondylosis without acute cervical spine fracture. Multilevel degenerative disc and facet arthropathy. Electronically Signed   By: Ashley Royalty M.D.   On: 08/19/2018 18:30   Dg Hip Unilat With Pelvis 2-3 Views Left  Result Date: 08/19/2018 CLINICAL DATA:  Patient fell last evening  was found on the floor today. Foreshortened left leg. EXAM: DG HIP (WITH OR WITHOUT PELVIS) 2-3V LEFT COMPARISON:  None. FINDINGS: AP view the pelvis as well as AP and frog-leg views of the left hip were provided. There is a comminuted, closed subtrochanteric fracture of the left femur with lateral displacement of the main fracture fragment by 1 shaft width and 1/2 shaft width medial displacement of a fracture fragment that includes a portion of the lesser trochanter. A segmental fracture fragment is seen superimposed over the proximal femoral diaphysis measuring 6.1 x 3.4 cm. No acute fracture of the right hip with cemented right total hip arthroplasty. The bony pelvis appears intact. IMPRESSION: There is a comminuted, closed subtrochanteric fracture of the left femur with lateral displacement of the main fracture fragment by 1 shaft width and 1/2 shaft width medial displacement of a fracture fragment that includes a portion of the lesser trochanter. A segmental fracture fragment is seen superimposed over the proximal femoral diaphysis measuring 6.1 x 3.4 cm Intact right total hip arthroplasty. Electronically Signed   By: Ashley Royalty M.D.   On: 08/19/2018 18:19      Scheduled Meds: . [MAR Hold] aspirin EC  81 mg Oral Daily  . [MAR Hold] carvedilol  12.5 mg Oral BID  . [MAR Hold] cholecalciferol  2,000 Units Oral Daily  . [MAR Hold] feeding supplement (ENSURE ENLIVE)  237 mL Oral BID BM  . [MAR Hold] insulin aspart  0-5 Units Subcutaneous QHS  . [MAR Hold] insulin aspart  0-9 Units Subcutaneous TID WC  . [MAR Hold] multivitamin with minerals  1 tablet Oral Daily  . [MAR Hold] mupirocin ointment  1 application Nasal BID  . [MAR Hold] omega-3 acid ethyl esters  1 g Oral BID  . [MAR Hold] oxybutynin  5 mg Oral BID  . [MAR Hold] sacubitril-valsartan  1 tablet Oral BID  . tranexamic acid (CYKLOKAPRON) topical -INTRAOP  2,000 mg Topical To OR  . [MAR Hold] vitamin B-12  1,000 mcg Oral Daily    Continuous Infusions: . sodium chloride 50 mL/hr at 08/19/18 2233  . dextrose 5 % and 0.45% NaCl Stopped (08/20/18 1230)  . lactated ringers 75 mL/hr at 08/20/18 1233  . tranexamic acid    . vancomycin 1,000 mg (08/20/18 1416)     LOS: 1 day    Time spent: Total of 25 minutes spent with pt, greater than 50% of which was spent in discussion of  treatment, counseling and coordination of care   Chipper Oman, MD Pager: Text Page via [email or web address]   If 7PM-7AM, please contact night-coverage [email or web address] 08/20/2018, 2:47 PM   Note - This record has been created using [employer]. Chart creation errors have been sought, but may not always have been located. Such creation errors do not reflect on the standard of medical care.

## 2018-08-20 NOTE — Transfer of Care (Signed)
Immediate Anesthesia Transfer of Care Note  Patient: [name]  Procedure(s) Performed: INTRAMEDULLARY (IM) NAIL FEMORAL (Left Hip)  Patient Location: PACU  Anesthesia Type:General  Level of Consciousness: awake and patient cooperative  Airway & Oxygen Therapy: Patient Spontanous Breathing and Patient connected to face mask oxygen  Post-op Assessment: Report given to RN and Post -op Vital signs reviewed and stable  Post vital signs: Reviewed and stable  Last Vitals:  Vitals Value Taken Time  BP 118/49 08/20/2018  5:35 PM  Temp    Pulse 66 08/20/2018  5:41 PM  Resp 20 08/20/2018  5:41 PM  SpO2 100 % 08/20/2018  5:41 PM  Vitals shown include unvalidated device data.  Last Pain:  Vitals:   08/20/18 0756  TempSrc:   PainSc: 8       Patients Stated Pain Goal: 0 (53/31/74 0992)  Complications: No apparent anesthesia complications

## 2018-08-20 NOTE — Progress Notes (Signed)
Writer paged Dr. Hilbert Bible d/t b/p 106/45. IVF currently infusing @ 171ml/hr. Pt A&Ox2. Awaiting call back or orders from provider.

## 2018-08-20 NOTE — Anesthesia Preprocedure Evaluation (Addendum)
Anesthesia Evaluation  Patient identified by MRN, date of birth, ID band Patient awake    Reviewed: Allergy & Precautions, NPO status , Patient's Chart, lab work & pertinent test results  Airway Mallampati: II  TM Distance: >3 FB Neck ROM: Full  Mouth opening: Limited Mouth Opening  Dental  (+) Dental Advisory Given,    Pulmonary neg pulmonary ROS,    breath sounds clear to auscultation       Cardiovascular hypertension, Pt. on medications + CAD, + CABG, + Peripheral Vascular Disease (b/l carotid stenosis ) and +CHF  + pacemaker + Valvular Problems/Murmurs (MR s/p mitral ring)  Rhythm:Regular Rate:Normal  TTE 05/2017 EF 30-35%, mild MS  pulm HTN: PA pressure was 83 mmHg on echo in 2017; however, normal on 2018 echo   Neuro/Psych CVA (2006) negative neurological ROS  negative psych ROS   GI/Hepatic negative GI ROS, Neg liver ROS,   Endo/Other  diabetes, Type 2  Renal/GU negative Renal ROS  negative genitourinary   Musculoskeletal  (+) Arthritis ,   Abdominal   Peds  Hematology  (+) anemia ,   Anesthesia Other Findings Day of surgery medications reviewed with the patient.  Reproductive/Obstetrics negative OB ROS                            Anesthesia Physical Anesthesia Plan  ASA: IV  Anesthesia Plan: General   Post-op Pain Management:    Induction: Intravenous  PONV Risk Score and Plan: 3 and Ondansetron and Dexamethasone  Airway Management Planned: Oral ETT  Additional Equipment:   Intra-op Plan:   Post-operative Plan:   Informed Consent: I have reviewed the patients History and Physical, chart, labs and discussed the procedure including the risks, benefits and alternatives for the proposed anesthesia with the patient or authorized representative who has indicated his/her understanding and acceptance.   Dental advisory given  Plan Discussed with:   Anesthesia Plan  Comments:        Anesthesia Quick Evaluation

## 2018-08-21 ENCOUNTER — Inpatient Hospital Stay (HOSPITAL_COMMUNITY): Payer: Medicare Other

## 2018-08-21 ENCOUNTER — Encounter (HOSPITAL_COMMUNITY): Payer: Self-pay | Admitting: Orthopedic Surgery

## 2018-08-21 ENCOUNTER — Ambulatory Visit: Payer: Medicare Other | Admitting: Cardiology

## 2018-08-21 DIAGNOSIS — I5042 Chronic combined systolic (congestive) and diastolic (congestive) heart failure: Secondary | ICD-10-CM

## 2018-08-21 LAB — CBC
HEMATOCRIT: 21.2 % — AB (ref 36.0–46.0)
HEMOGLOBIN: 7 g/dL — AB (ref 12.0–15.0)
MCH: 33.3 pg (ref 26.0–34.0)
MCHC: 33 g/dL (ref 30.0–36.0)
MCV: 101 fL — AB (ref 78.0–100.0)
Platelets: 128 10*3/uL — ABNORMAL LOW (ref 150–400)
RBC: 2.1 MIL/uL — ABNORMAL LOW (ref 3.87–5.11)
RDW: 12.9 % (ref 11.5–15.5)
WBC: 10.9 10*3/uL — ABNORMAL HIGH (ref 4.0–10.5)

## 2018-08-21 LAB — IRON AND TIBC
IRON: 17 ug/dL — AB (ref 28–170)
SATURATION RATIOS: 7 % — AB (ref 10.4–31.8)
TIBC: 228 ug/dL — AB (ref 250–450)
UIBC: 211 ug/dL

## 2018-08-21 LAB — GLUCOSE, CAPILLARY
GLUCOSE-CAPILLARY: 165 mg/dL — AB (ref 70–99)
GLUCOSE-CAPILLARY: 133 mg/dL — AB (ref 70–99)
Glucose-Capillary: 316 mg/dL — ABNORMAL HIGH (ref 70–99)
Glucose-Capillary: 239 mg/dL — ABNORMAL HIGH (ref 70–99)

## 2018-08-21 LAB — BASIC METABOLIC PANEL
Anion gap: 9 (ref 5–15)
BUN: 27 mg/dL — ABNORMAL HIGH (ref 8–23)
CALCIUM: 8.6 mg/dL — AB (ref 8.9–10.3)
CHLORIDE: 108 mmol/L (ref 98–111)
CO2: 20 mmol/L — AB (ref 22–32)
CREATININE: 1.08 mg/dL — AB (ref 0.44–1.00)
GFR calc Af Amer: 52 mL/min — ABNORMAL LOW (ref >60)
GFR calc non Af Amer: 44 mL/min — ABNORMAL LOW (ref >60)
Glucose, Bld: 307 mg/dL — ABNORMAL HIGH (ref 70–99)
Potassium: 4.8 mmol/L (ref 3.5–5.1)
Sodium: 137 mmol/L (ref 135–145)

## 2018-08-21 LAB — FOLATE: Folate: 16 ng/mL (ref >5.9)

## 2018-08-21 LAB — PROTIME-INR
INR: 1.1
PROTHROMBIN TIME: 14.1 s (ref 11.4–15.2)

## 2018-08-21 LAB — RETICULOCYTES
RBC.: 2.12 MIL/uL — AB (ref 3.87–5.11)
RETIC CT PCT: 2.2 % (ref 0.4–3.1)
Retic Count, Absolute: 46.6 10*3/uL (ref 19.0–186.0)

## 2018-08-21 LAB — PREPARE RBC (CROSSMATCH)

## 2018-08-21 LAB — VITAMIN B12

## 2018-08-21 LAB — CK: Total CK: 1016 U/L — ABNORMAL HIGH (ref 38–234)

## 2018-08-21 LAB — FERRITIN: Ferritin: 146 ng/mL (ref 11–307)

## 2018-08-21 MED ORDER — SODIUM CHLORIDE 0.9% IV SOLUTION: Freq: Once | INTRAVENOUS | Status: AC

## 2018-08-21 MED ORDER — FUROSEMIDE 10 MG/ML IJ SOLN
20.0000 mg | Freq: Once | INTRAMUSCULAR | Status: AC
  Administered 2018-08-21: 20 mg via INTRAVENOUS
  Filled 2018-08-21: qty 2

## 2018-08-21 MED ORDER — POLYSACCHARIDE IRON COMPLEX 150 MG PO CAPS
150.0000 mg | ORAL_CAPSULE | Freq: Every day | ORAL | Status: DC
  Administered 2018-08-21 – 2018-08-25 (×5): 150 mg via ORAL
  Filled 2018-08-21 (×5): qty 1

## 2018-08-21 MED ORDER — SODIUM CHLORIDE 0.9% IV SOLUTION
Freq: Once | INTRAVENOUS | Status: AC
  Administered 2018-08-21: 09:00:00 via INTRAVENOUS

## 2018-08-21 NOTE — Clinical Social Work Note (Signed)
Clinical Social Work Assessment  Patient Details  Name: AMAIA LAVALLIE MRN: [id number] Date of Birth: 31-Oct-1928  Date of referral:  08/21/18               Reason for consult:  Facility Placement                Permission sought to share information with:  Case Manager, Customer service manager, Family Supports Permission granted to share information::  Yes, Verbal Permission Granted  Name::     Butch Penny( Daughter) Manuela Schwartz Audiological scientist)   Agency::  SNF  Relationship::  Family and Designer, fashion/clothing Information:     Housing/Transportation Living arrangements for the past 2 months:  Single Family Home Source of Information:  Patient Patient Interpreter Needed:  None Criminal Activity/Legal Involvement Pertinent to Current Situation/Hospitalization:  No - Comment as needed Significant Relationships:  Adult Children, Neighbor Lives with:  Self Do you feel safe going back to the place where you live?  Yes Need for family participation in patient care:  Yes (Comment)  Care giving concerns:  Patient lives alone.   Social Worker assessment / plan:  LCSW consulted for SNF placement.   Patient  is a 84 y.o. female with medical history significant of hypertension, hyperlipidemia, diabetes mellitus, stroke, CHF with EF of 30%, complete heart block, pacemaker placement, CAD, pulmonary hypertension, CKD 3, who presents with a fall and left hip pain.  LCSW met at bedside with patient following hip surgery. PT recommends SNF for rehab.   Patient is agreeable to SNF at for rehab. Patient reports she is independent in all ADLs prior to fall. Patient states she has 4 steps in the front and back of her home. Patient does not drive. Patient asked that LCSW speak with neighbor Manuela Schwartz to help make SNF decision. Manuela Schwartz advised LCSW to consult with patient's daughter Butch Penny.   PLAN: SNF at dc.   Employment status:  Retired Nurse, adult PT Recommendations:  [facility] / Referral to community resources:     Patient/Family's Response to care:  Patient thanked LCSW for visit and dc coordination.   Patient/Family's Understanding of and Emotional Response to Diagnosis, Current Treatment, and Prognosis:  Patient states, "this is all a mess." Patient is agreeable to rehab and states she needs help making a decision.   Emotional Assessment Appearance:  Appears stated age Attitude/Demeanor/Rapport:    Affect (typically observed):  Accepting, Frustrated Orientation:  Oriented to Self, Oriented to Place, Oriented to  Time, Oriented to Situation Alcohol / Substance use:  Not Applicable Psych involvement (Current and /or in the community):  No (Comment)  Discharge Needs  Concerns to be addressed:  Care Coordination, Home Safety Concerns Readmission within the last 30 days:  No Current discharge risk:  None Barriers to Discharge:  Continued Medical Work up   [employer], LCSW 08/21/2018, 3:10 PM

## 2018-08-21 NOTE — Evaluation (Signed)
Physical Therapy Evaluation Patient Details Name: [name] MRN: [id number] DOB: [birth year] Today's Date: 08/21/2018   History of Present Illness  84 year old female with medical history significant of hypertension, diabetes mellitus type 2, chronic systolic CHF, CAD, pulmonary hypertension, complete heart block status post pacemaker placement, and CKD stage III who presented to the emergency department after mechanical fall, found to have left hip fracture and s/p L IM nail 08/20/18  Clinical Impression  Patient is s/p above surgery resulting in functional limitations due to the deficits listed below (see PT Problem List).  Patient will benefit from skilled PT to increase their independence and safety with mobility to allow discharge to the venue listed below.  Pt requiring increased assist for mobility at this time and unable to transfer due to weakness, pain, and fatigue.  Pt from home alone so recommend SNF upon d/c at this time.     Follow Up Recommendations SNF    Equipment Recommendations  None recommended by PT    Recommendations for Other Services       Precautions / Restrictions Precautions Precautions: Fall Restrictions Weight Bearing Restrictions: Yes LLE Weight Bearing: Partial weight bearing LLE Partial Weight Bearing Percentage or Pounds: 50%      Mobility  Bed Mobility Overal bed mobility: Needs Assistance Bed Mobility: Supine to Sit;Sit to Supine     Supine to sit: Max assist;HOB elevated Sit to supine: +2 for physical assistance;Total assist   General bed mobility comments: multimodal cues for technique, increased assist required  Transfers Overall transfer level: Needs assistance Equipment used: Rolling walker (2 wheeled) Transfers: Sit to/from Stand Sit to Stand: Total assist;+2 physical assistance         General transfer comment: pt able to stand full erect however only briefly due to weakness, pain and fatigue, not able to transfer at this  time for safety  Ambulation/Gait                Stairs            Wheelchair Mobility    Modified Rankin (Stroke Patients Only)       Balance Overall balance assessment: History of Falls                                           Pertinent Vitals/Pain Pain Assessment: Faces Faces Pain Scale: Hurts a little bit Pain Location: L hip with movement Pain Descriptors / Indicators: Grimacing;Operative site guarding Pain Intervention(s): Limited activity within patient's tolerance;Repositioned;Monitored during session;Premedicated before session    Home Living Family/patient expects to be discharged to:: Private residence Living Arrangements: Alone                    Prior Function Level of Independence: Independent               Hand Dominance        Extremity/Trunk Assessment   Upper Extremity Assessment Upper Extremity Assessment: Generalized weakness    Lower Extremity Assessment Lower Extremity Assessment: Generalized weakness;LLE deficits/detail LLE Deficits / Details: unable to move against gravity, requires assist, able to perform ankle pumps LLE: Unable to fully assess due to pain       Communication   Communication: No difficulties  Cognition Arousal/Alertness: Awake/alert Behavior During Therapy: WFL for tasks assessed/performed  General Comments: slow to initiate and process, family in room and did not state any changes to cognition      General Comments      Exercises     Assessment/Plan    PT Assessment Patient needs continued PT services  PT Problem List Decreased strength;Decreased mobility;Decreased activity tolerance;Decreased balance;Decreased knowledge of use of DME;Decreased knowledge of precautions;Decreased safety awareness       PT Treatment Interventions DME instruction;Therapeutic activities;Gait training;Therapeutic  exercise;Patient/family education;Functional mobility training;Balance training    PT Goals (Current goals can be found in the Care Plan section)  Acute Rehab PT Goals PT Goal Formulation: With patient Time For Goal Achievement: 09/04/18 Potential to Achieve Goals: Good    Frequency Min 3X/week   Barriers to discharge        Co-evaluation               AM-PAC PT "6 Clicks" Daily Activity  Outcome Measure Difficulty turning over in bed (including adjusting bedclothes, sheets and blankets)?: Unable Difficulty moving from lying on back to sitting on the side of the bed? : Unable Difficulty sitting down on and standing up from a chair with arms (e.g., wheelchair, bedside commode, etc,.)?: Unable Help needed moving to and from a bed to chair (including a wheelchair)?: Total Help needed walking in hospital room?: Total Help needed climbing 3-5 steps with a railing? : Total 6 Click Score: 6    End of Session Equipment Utilized During Treatment: Gait belt Activity Tolerance: Patient limited by fatigue Patient left: in bed;with call bell/phone within reach;with family/visitor present;with nursing/sitter in room Nurse Communication: Mobility status PT Visit Diagnosis: Other abnormalities of gait and mobility (R26.89)    Time: 7482-7078 PT Time Calculation (min) (ACUTE ONLY): 19 min   Charges:   PT Evaluation $PT Eval Low Complexity: 1 Low         Carmelia Bake, PT, DPT 08/21/2018 Pager: [phone]  York Ram E 08/21/2018, 12:47 PM

## 2018-08-21 NOTE — Progress Notes (Signed)
Writer paged Dr. Hilbert Bible through [facility] regarding hgb 7.0, hct 21.2, plt 128. Dr. Mayer Camel in to see pt early AM before labwork was back. Awaiting orders or call back from provider. Pt POD 1 ORIF.

## 2018-08-21 NOTE — Progress Notes (Signed)
PATIENT ID: [name]  MRN: [id number]  DOB/AGE:  08/03/1928 / 84 y.o.  1 Day Post-Op Procedure(s) (LRB): INTRAMEDULLARY (IM) NAIL FEMORAL (Left)    PROGRESS NOTE Subjective: Patient is alert, oriented, no Nausea, no Vomiting, yes passing gas, . Taking PO well. Denies SOB, Chest or Calf Pain. Using Incentive Spirometer, PAS in place. Ambulate patient will be partial weightbearing 50% for the next 6 weeks.  Patient denies any sensation of weakness or dizziness but has not sat up or stood yet. Patient reports pain as  3/10  .    Objective: Vital signs in last 24 hours: Vitals:   08/20/18 2150 08/20/18 2207 08/20/18 2357 08/21/18 0613  BP:  132/64 (Abnormal) 106/45 (Abnormal) 119/47  Pulse: 85 80 76 67  Resp: 19   20  Temp: 98.5 F (36.9 C)     TempSrc:      SpO2: 97%   96%  Weight:      Height:          Intake/Output from previous day: I/O last 3 completed shifts: In: 3325.9 [P.O.:60; I.V.:3165.9; IV Piggyback:100] Out: 8338 [Urine:1050; Blood:300]   Intake/Output this shift: No intake/output data recorded.   LABORATORY DATA: Recent Labs    08/19/18 1715  08/20/18 1535 08/20/18 1740 08/20/18 1904 08/20/18 2219 08/21/18 0615  WBC 10.0  --   --   --   --   --  10.9*  HGB 9.4*  --   --   --  8.1*  --  7.0*  HCT 29.1*  --   --   --  24.1*  --  21.2*  PLT 170  --   --   --   --   --  128*  NA 141  --   --   --   --   --  137  K 4.4  --   --   --   --   --  4.8  CL 110  --   --   --   --   --  108  CO2 23  --   --   --   --   --  20*  BUN 27*  --   --   --   --   --  27*  CREATININE 1.33*  --   --   --   --   --  1.08*  GLUCOSE 230*  --   --   --   --   --  307*  GLUCAP  --    < > 138* 136*  --  299*  --   CALCIUM 9.4  --   --   --   --   --  8.6*   < > = values in this interval not displayed.    Examination: Neurologically intact ABD soft Neurovascular intact Sensation intact distally Intact pulses distally Dorsiflexion/Plantar flexion intact Incision:  dressing C/D/I No cellulitis present Compartment soft} XR AP&Lat of hip shows well placed\fixed THA  Assessment:   1 Day Post-Op Procedure(s) (LRB): INTRAMEDULLARY (IM) NAIL FEMORAL (Left) ADDITIONAL DIAGNOSIS:  Expected Acute Blood Loss Anemia, Diabetes and Hypertension, CHF, CAD,CKD  Plan: PT/OT WBAT, THA, will transfuse 1 unit of packed red blood cells for hemoglobin of 7.  DVT Prophylaxis: SCDx72 hrs, ASA 81 mg BID x 2 weeks  DISCHARGE PLAN: Skilled Nursing Facility/Rehab  DISCHARGE NEEDS: HHPT, Walker and 3-in-1 comode seat

## 2018-08-21 NOTE — Progress Notes (Addendum)
Progress Note  Patient Name: [name] Date of Encounter: 08/21/2018  Primary Cardiologist:  Fransico Him, MD  Subjective   No chest pain or SOB, upper airway/throat congestion, coughs up thick white mucus  Inpatient Medications    Scheduled Meds: . sodium chloride   Intravenous Once  . sodium chloride   Intravenous Once  . carvedilol  12.5 mg Oral BID  . cholecalciferol  2,000 Units Oral Daily  . docusate sodium  100 mg Oral BID  . feeding supplement (ENSURE ENLIVE)  237 mL Oral BID BM  . furosemide  20 mg Intravenous Once  . insulin aspart  0-5 Units Subcutaneous QHS  . insulin aspart  0-9 Units Subcutaneous TID WC  . multivitamin with minerals  1 tablet Oral Daily  . mupirocin ointment  1 application Nasal BID  . omega-3 acid ethyl esters  1 g Oral BID  . oxybutynin  5 mg Oral BID  . sacubitril-valsartan  1 tablet Oral BID  . vitamin B-12  1,000 mcg Oral Daily   Continuous Infusions: . sodium chloride 50 mL/hr at 08/19/18 2233  . dextrose 5 % and 0.45 % NaCl with KCl 20 mEq/L 100 mL/hr at 08/20/18 2215   PRN Meds: acetaminophen, hydrALAZINE, HYDROcodone-acetaminophen, HYDROcodone-acetaminophen, menthol-cetylpyridinium **OR** phenol, methocarbamol **OR** methocarbamol (ROBAXIN) IV, metoCLOPramide **OR** metoCLOPramide (REGLAN) injection, morphine injection, ondansetron **OR** ondansetron (ZOFRAN) IV, oxymetazoline, polyethylene glycol, zolpidem   Vital Signs    Vitals:   08/20/18 2207 08/20/18 2357 08/21/18 0613 08/21/18 0826  BP: 132/64 (!) 106/45 (!) 119/47 (!) 99/39  Pulse: 80 76 67 60  Resp:   20 14  Temp:    98.4 F (36.9 C)  TempSrc:    Oral  SpO2:   96% 97%  Weight:      Height:        Intake/Output Summary (Last 24 hours) at 08/21/2018 0854 Last data filed at 08/21/2018 1607 Gross per 24 hour  Intake 2931.88 ml  Output 1250 ml  Net 1681.88 ml   Filed Weights   08/19/18 1656 08/19/18 2315  Weight: 59 kg 58.8 kg    Telemetry    A pacing  +/- V pacing, rare PVCs - Personally Reviewed  ECG    None today - Personally Reviewed  Physical Exam   General: Well developed, well nourished, female appearing in no acute distress. Head: Normocephalic, atraumatic.  Neck: Supple without bruits, JVD not elevated. Lungs:  Resp regular and unlabored, CTA. Heart: RRR, S1, S2, no S3, S4, or murmur; no rub. Abdomen: Soft, non-tender, non-distended with normoactive bowel sounds. No hepatomegaly. No rebound/guarding. No obvious abdominal masses. Extremities: No clubbing, cyanosis, no edema. Distal pulses are 2+ bilaterally. S/p L hip fx repair, POD 1 Neuro: Alert and oriented X 2. Moves all extremities spontaneously. Psych: Normal affect.  Labs    Hematology Recent Labs  Lab 08/19/18 1715 08/20/18 1904 08/21/18 0615 08/21/18 0748  WBC 10.0  --  10.9*  --   RBC 2.85*  --  2.10* 2.12*  HGB 9.4* 8.1* 7.0*  --   HCT 29.1* 24.1* 21.2*  --   MCV 102.1*  --  101.0*  --   MCH 33.0  --  33.3  --   MCHC 32.3  --  33.0  --   RDW 12.4  --  12.9  --   PLT 170  --  128*  --     Chemistry Recent Labs  Lab 08/19/18 1715 08/21/18 0615  NA 141 137  K 4.4 4.8  CL 110 108  CO2 23 20*  GLUCOSE 230* 307*  BUN 27* 27*  CREATININE 1.33* 1.08*  CALCIUM 9.4 8.6*  PROT 5.7*  --   ALBUMIN 3.2*  --   AST 57*  --   ALT 24  --   ALKPHOS 63  --   BILITOT 1.3*  --   GFRNONAA 35* 44*  GFRAA 40* 52*  ANIONGAP 8 9     Radiology    Dg Chest 1 View  Result Date: 08/19/2018 CLINICAL DATA:  Patient fell last evening at 1839 hours and was found on the floor today. Syncopal episode. EXAM: CHEST  1 VIEW COMPARISON:  11/17/2015 FINDINGS: Heart and mediastinal contours are stable and within normal limits with post CABG change and mitral valvular replacement. Mild aortic atherosclerosis without aneurysm. ICD device projects over the left axilla with leads in the right atrium and right ventricle. Lungs are clear without acute pulmonary consolidations.  Osteoarthritis of the right glenohumeral joint with high-riding humeral heads bilaterally. Remodeled appearance of the undersurface a the distal right clavicle. Findings likely reflect chronic rotator cuff tear. Differential possibility may include rheumatoid arthritis. IMPRESSION: No active cardiopulmonary disease. Aortic atherosclerosis. Electronically Signed   By: Ashley Royalty M.D.   On: 08/19/2018 18:11   Ct Head Wo Contrast  Result Date: 08/19/2018 CLINICAL DATA:  Patient slipped and fell and was found on the floor. EXAM: CT HEAD WITHOUT CONTRAST CT CERVICAL SPINE WITHOUT CONTRAST TECHNIQUE: Multidetector CT imaging of the head and cervical spine was performed following the standard protocol without intravenous contrast. Multiplanar CT image reconstructions of the cervical spine were also generated. COMPARISON:  11/18/2017 and 11/16/2015 FINDINGS: CT HEAD FINDINGS BRAIN: There is mild to moderate sulcal and ventricular prominence consistent with superficial and central atrophy. No intraparenchymal hemorrhage, mass effect nor midline shift. Periventricular and subcortical white matter hypodensities consistent with mild-to-moderate chronic small vessel ischemic disease are identified. No acute large vascular territory infarcts. No abnormal extra-axial fluid collections. Basal cisterns are not effaced and midline. Intact brainstem and cerebellum. VASCULAR: Moderate calcific atherosclerosis of the carotid siphons. SKULL: No skull fracture. No significant scalp soft tissue swelling. SINUSES/ORBITS: The mastoid air-cells are clear. The included paranasal sinuses are well-aerated.The included ocular globes and orbital contents are non-suspicious. OTHER: None. CT CERVICAL SPINE FINDINGS Alignment: There is maintained cervical lordosis Skull base and vertebrae: Osteoarthritis of the atlantodental interval with soft tissue pannus posterior to the odontoid process and subcortical erosive change is redemonstrated. Soft  tissues and spinal canal: No prevertebral fluid or swelling. No visible canal hematoma. Disc levels: Minimal anterolisthesis grade 1 of C3 on C4. Moderate disc flattening at C5-6 and C6-7. Bilateral uncovertebral joint osteoarthritis is seen at C3-4, C4-5 to a greater extent C5-6 and C6-7. Multilevel degenerative facet arthropathy is seen. Small central disc protrusion C2-3. Central to right central disc bulge at C3-4, mild broad-based disc bulge C4-5, and disc-osteophyte complex at C5-6. Upper chest: Negative. Other: None IMPRESSION: Head CT: 1. Atrophy with chronic small vessel ischemia. No acute intracranial abnormality. 2. Cervical spondylosis without acute cervical spine fracture. Multilevel degenerative disc and facet arthropathy. Electronically Signed   By: Ashley Royalty M.D.   On: 08/19/2018 18:30   Ct Cervical Spine Wo Contrast  Result Date: 08/19/2018 CLINICAL DATA:  Patient slipped and fell and was found on the floor. EXAM: CT HEAD WITHOUT CONTRAST CT CERVICAL SPINE WITHOUT CONTRAST TECHNIQUE: Multidetector CT imaging of the head and cervical spine was performed following  the standard protocol without intravenous contrast. Multiplanar CT image reconstructions of the cervical spine were also generated. COMPARISON:  11/18/2017 and 11/16/2015 FINDINGS: CT HEAD FINDINGS BRAIN: There is mild to moderate sulcal and ventricular prominence consistent with superficial and central atrophy. No intraparenchymal hemorrhage, mass effect nor midline shift. Periventricular and subcortical white matter hypodensities consistent with mild-to-moderate chronic small vessel ischemic disease are identified. No acute large vascular territory infarcts. No abnormal extra-axial fluid collections. Basal cisterns are not effaced and midline. Intact brainstem and cerebellum. VASCULAR: Moderate calcific atherosclerosis of the carotid siphons. SKULL: No skull fracture. No significant scalp soft tissue swelling. SINUSES/ORBITS: The  mastoid air-cells are clear. The included paranasal sinuses are well-aerated.The included ocular globes and orbital contents are non-suspicious. OTHER: None. CT CERVICAL SPINE FINDINGS Alignment: There is maintained cervical lordosis Skull base and vertebrae: Osteoarthritis of the atlantodental interval with soft tissue pannus posterior to the odontoid process and subcortical erosive change is redemonstrated. Soft tissues and spinal canal: No prevertebral fluid or swelling. No visible canal hematoma. Disc levels: Minimal anterolisthesis grade 1 of C3 on C4. Moderate disc flattening at C5-6 and C6-7. Bilateral uncovertebral joint osteoarthritis is seen at C3-4, C4-5 to a greater extent C5-6 and C6-7. Multilevel degenerative facet arthropathy is seen. Small central disc protrusion C2-3. Central to right central disc bulge at C3-4, mild broad-based disc bulge C4-5, and disc-osteophyte complex at C5-6. Upper chest: Negative. Other: None IMPRESSION: Head CT: 1. Atrophy with chronic small vessel ischemia. No acute intracranial abnormality. 2. Cervical spondylosis without acute cervical spine fracture. Multilevel degenerative disc and facet arthropathy. Electronically Signed   By: Ashley Royalty M.D.   On: 08/19/2018 18:30   Dg C-arm 1-60 Min-no Report  Result Date: 08/20/2018 Fluoroscopy was utilized by the requesting physician.  No radiographic interpretation.   Dg Hip Unilat With Pelvis 2-3 Views Left  Result Date: 08/19/2018 CLINICAL DATA:  Patient fell last evening was found on the floor today. Foreshortened left leg. EXAM: DG HIP (WITH OR WITHOUT PELVIS) 2-3V LEFT COMPARISON:  None. FINDINGS: AP view the pelvis as well as AP and frog-leg views of the left hip were provided. There is a comminuted, closed subtrochanteric fracture of the left femur with lateral displacement of the main fracture fragment by 1 shaft width and 1/2 shaft width medial displacement of a fracture fragment that includes a portion of the  lesser trochanter. A segmental fracture fragment is seen superimposed over the proximal femoral diaphysis measuring 6.1 x 3.4 cm. No acute fracture of the right hip with cemented right total hip arthroplasty. The bony pelvis appears intact. IMPRESSION: There is a comminuted, closed subtrochanteric fracture of the left femur with lateral displacement of the main fracture fragment by 1 shaft width and 1/2 shaft width medial displacement of a fracture fragment that includes a portion of the lesser trochanter. A segmental fracture fragment is seen superimposed over the proximal femoral diaphysis measuring 6.1 x 3.4 cm Intact right total hip arthroplasty. Electronically Signed   By: Ashley Royalty M.D.   On: 08/19/2018 18:19   Dg Femur 1v Left  Result Date: 08/20/2018 CLINICAL DATA:  Operative fixation of a comminuted left femur subtrochanteric fracture. EXAM: LEFT FEMUR 1 VIEW; DG C-ARM 1-60 MIN-NO REPORT COMPARISON:  Yesterday. FINDINGS: Twelve C-arm views of the left femur demonstrate intramedullary rod and compression screw fixation of the previously demonstrated comminuted subtrochanteric fracture with cerclage wires. Essentially anatomic position and alignment of the fragments on these views. IMPRESSION: Hardware fixation of the previously  demonstrated comminuted subtrochanteric left femur fracture. Electronically Signed   By: Claudie Revering M.D.   On: 08/20/2018 17:25     Cardiac Studies   None this admission, EF 30-35% by echo 05/2017  Patient Profile     84 y.o. female w/ hx ischemic dilated cardiomyopathy, chronic systolic and diastolic heart failure, CAD status post CABG, carotid artery stenosis (RICA occlused), dyslipidemia, HTN, mitral stenosis status post MVR, complete heart block status post pacemaker placement, paroxysmal A. fib with last pacer check 06/09/18 with good function, not on anticoagulation due to hx falls, CKD stage III   Assessment & Plan    1.  Chronic combined systolic and  diastolic CHF: -She is to get Lasix 20 mg IV today because she is getting a blood transfusion. - Previous dose of Lasix was 40 mg daily as needed - Need to track daily weights, will order -No signs or symptoms of volume overload -On beta-blocker and Entresto at home doses  2.  CAD: - She is currently on aspirin, beta-blocker -No ischemic symptoms  3.  Complete heart block - Pacemaker is functioning well, she had some rare PVCs  Otherwise, per IM and Ortho Principal Problem:   Closed left hip fracture ([place]) Active Problems:   Hypertension   CKD (chronic kidney disease), stage III ([place])   Coronary artery disease   Type II diabetes mellitus with renal manifestations (HCC)   Complete heart block (HCC)   Chronic combined systolic and diastolic CHF (congestive heart failure) ([place])   Fall   Macrocytic anemia   Cardiology will see again as needed  Follow Up: The patient's Primary Cardiologist is Fransico Him, MD   Follow up in the office in 2 week(s).  Jonetta Speak, PA-C  9:04 AM 08/21/2018  [facility] Pager: [phone] As above, patient seen and examined.  She denies chest pain or dyspnea.  Would continue preadmission cardiac medications including carvedilol and Entresto.  Lasix as needed.  Other issues per primary care and orthopedics. [facility] will sign off.   Medication Recommendations:  Continue preadmission cardiac meds Other recommendations (labs, testing, etc):  No further cardiac testing Follow up as an outpatient:  Dr Radford Pax 3 months  Kirk Ruths, MD

## 2018-08-21 NOTE — NC FL2 (Signed)
[place] LEVEL OF CARE SCREENING TOOL     IDENTIFICATION  Patient Name: [name] Birthdate: June 25, 1928 Sex: female Admission Date (Current Location): 08/19/2018  [facility] and [place] Number:  Herbalist and Address:  [facility],  [address] [place], [place]      Provider Number: [id number]  Attending Physician Name and Address:  Patrecia Pour, Christean Grief, MD  Relative Name and Phone Number:       Current Level of Care: Hospital Recommended Level of Care: [place] Prior Approval Number:    Date Approved/Denied:   PASRR Number:   [id number]  Discharge Plan: Home    Current Diagnoses: Patient Active Problem List   Diagnosis Date Noted  . Closed left hip fracture ([place]) 08/19/2018  . Macrocytic anemia 08/19/2018  . DCM (dilated cardiomyopathy) ([place]) 02/20/2018  . Heart murmur 05/14/2016  . Fall 11/17/2015  . Scalp laceration 11/17/2015  . Facial contusion 11/17/2015  . Rib fracture 11/16/2015  . Paresthesia of both hands 01/18/2014  . Renal insufficiency 01/18/2014  . Lower extremity weakness 01/18/2014  . Coronary artery disease   . Type II diabetes mellitus with renal manifestations ([place])   . Hyperlipidemia   . Carotid stenosis   . Complete heart block ([place])   . Severe mitral regurgitation   . Chronic combined systolic and diastolic CHF (congestive heart failure) ([place])   . PAF (paroxysmal atrial fibrillation) ([place]) 10/29/2013  . 5176 lead 07/21/2013  . CKD (chronic kidney disease), stage III ([place])   . Hypertension 04/30/2011    Orientation RESPIRATION BLADDER Height & Weight     Self, Time, Situation, Place  Normal Indwelling catheter Weight: 164 lb (74.4 kg) Height:  5' (152.4 cm)  BEHAVIORAL SYMPTOMS/MOOD NEUROLOGICAL BOWEL NUTRITION STATUS      Continent Diet(see dc summary)  AMBULATORY STATUS COMMUNICATION OF NEEDS Skin   Extensive Assist Verbally Surgical wounds(left hip)                        Personal Care Assistance Level of Assistance  Bathing, Feeding, Dressing Bathing Assistance: Limited assistance Feeding assistance: Independent Dressing Assistance: Limited assistance     Functional Limitations Info  Sight, Hearing, Speech Sight Info: Impaired Hearing Info: Impaired Speech Info: Adequate    SPECIAL CARE FACTORS FREQUENCY  PT (By licensed PT), OT (By licensed OT)     PT Frequency: 5x/week OT Frequency: 5x/week            Contractures Contractures Info: Not present    Additional Factors Info  Code Status, Allergies Code Status Info: DNR Allergies Info: Fosamax Alendronate, Horse-derived Products, Clonidine Derivatives, Darvocet Propoxyphene N-acetaminophen, Lipitor Atorvastatin, Sulfa Drugs Cross Reactors, Tramadol, Vicodin Hydrocodone-acetaminophen, Penicillins, Septra Sulfamethoxazole-trimethoprim           Current Medications (08/21/2018):  This is the current hospital active medication list Current Facility-Administered Medications  Medication Dose Route Frequency Provider Last Rate Last Dose  . acetaminophen (TYLENOL) tablet 325-650 mg  325-650 mg Oral Q6H PRN Leighton Parody, PA-C      . carvedilol (COREG) tablet 12.5 mg  12.5 mg Oral BID Leighton Parody, PA-C   12.5 mg at 08/20/18 2207  . cholecalciferol (VITAMIN D) tablet 2,000 Units  2,000 Units Oral Daily Leighton Parody, PA-C   2,000 Units at 08/21/18 1037  . dextrose 5 % and 0.45 % NaCl with KCl 20 mEq/L infusion   Intravenous Continuous Patrecia Pour, Christean Grief, MD 50  mL/hr at 08/21/18 1442    . docusate sodium (COLACE) capsule 100 mg  100 mg Oral BID Leighton Parody, PA-C   100 mg at 08/21/18 1037  . feeding supplement (ENSURE ENLIVE) (ENSURE ENLIVE) liquid 237 mL  237 mL Oral BID BM Joanell Rising K, PA-C      . hydrALAZINE (APRESOLINE) injection 5 mg  5 mg Intravenous Q2H PRN Leighton Parody, PA-C      . HYDROcodone-acetaminophen (NORCO) 7.5-325 MG per tablet 1-2 tablet  1-2 tablet  Oral Q4H PRN Leighton Parody, PA-C      . HYDROcodone-acetaminophen (NORCO/VICODIN) 5-325 MG per tablet 1-2 tablet  1-2 tablet Oral Q4H PRN Leighton Parody, PA-C   2 tablet at 08/21/18 1037  . insulin aspart (novoLOG) injection 0-5 Units  0-5 Units Subcutaneous QHS Leighton Parody, PA-C   3 Units at 08/20/18 2221  . insulin aspart (novoLOG) injection 0-9 Units  0-9 Units Subcutaneous TID WC Leighton Parody, PA-C   3 Units at 08/21/18 1253  . iron polysaccharides (NIFEREX) capsule 150 mg  150 mg Oral Daily Patrecia Pour, Christean Grief, MD      . menthol-cetylpyridinium (CEPACOL) lozenge 3 mg  1 lozenge Oral PRN Leighton Parody, PA-C       Or  . phenol (CHLORASEPTIC) mouth spray 1 spray  1 spray Mouth/Throat PRN Leighton Parody, PA-C      . methocarbamol (ROBAXIN) tablet 500 mg  500 mg Oral Q6H PRN Leighton Parody, PA-C   500 mg at 08/21/18 1037   Or  . methocarbamol (ROBAXIN) 500 mg in dextrose 5 % 50 mL IVPB  500 mg Intravenous Q6H PRN Leighton Parody, PA-C      . metoCLOPramide (REGLAN) tablet 5-10 mg  5-10 mg Oral Q8H PRN Leighton Parody, PA-C       Or  . metoCLOPramide (REGLAN) injection 5-10 mg  5-10 mg Intravenous Q8H PRN Leighton Parody, PA-C      . morphine 4 MG/ML injection 0.52-1 mg  0.52-1 mg Intravenous Q2H PRN Leighton Parody, PA-C      . multivitamin with minerals tablet 1 tablet  1 tablet Oral Daily Leighton Parody, PA-C   1 tablet at 08/21/18 1037  . mupirocin ointment (BACTROBAN) 2 % 1 application  1 application Nasal BID Leighton Parody, PA-C   1 application at 70/26/37 1051  . omega-3 acid ethyl esters (LOVAZA) capsule 1 g  1 g Oral BID Joanell Rising K, PA-C   1 g at 08/21/18 1036  . ondansetron (ZOFRAN) tablet 4 mg  4 mg Oral Q6H PRN Leighton Parody, PA-C       Or  . ondansetron [facility]) injection 4 mg  4 mg Intravenous Q6H PRN Leighton Parody, PA-C      . oxybutynin (DITROPAN) tablet 5 mg  5 mg Oral BID Joanell Rising K, PA-C   5 mg at 08/21/18 1037  . oxymetazoline  (AFRIN) 0.05 % nasal spray 1 spray  1 spray Each Nare Daily PRN Leighton Parody, PA-C      . polyethylene glycol (MIRALAX / GLYCOLAX) packet 17 g  17 g Oral Daily PRN Leighton Parody, PA-C      . sacubitril-valsartan (ENTRESTO) 49-51 mg per tablet  1 tablet Oral BID Leighton Parody, PA-C   1 tablet at 08/20/18 2207  . vitamin B-12 (CYANOCOBALAMIN) tablet 1,000 mcg  1,000 mcg Oral Daily Joanell Rising K, PA-C   1,000  mcg at 08/21/18 1036  . zolpidem (AMBIEN) tablet 5 mg  5 mg Oral QHS PRN Leighton Parody, PA-C   5 mg at 08/20/18 2207     Discharge Medications: Please see discharge summary for a list of discharge medications.  Relevant Imaging Results:  Relevant Lab Results:   Additional Information ssn: [id number]  Servando Snare, LCSW

## 2018-08-21 NOTE — Care Management Note (Signed)
Case Management Note  Patient Details  Name: [name] MRN: [id number] Date of Birth: [birth year]  Subjective/Objective:                  Patient is alert, oriented, no Nausea, no Vomiting, yes passing gas, . Taking PO well. Denies SOB, Chest or Calf Pain. Using Incentive Spirometer, PAS in place. Ambulate patient will be partial weightbearing 50% for the next 6 weeks.  Patient denies any sensation of weakness or dizziness but has not sat up or stood yet. Patient reports pain as  3/10   .    Action/Plan: Assessment:   1 Day Post-Op Procedure(s) (LRB): INTRAMEDULLARY (IM) NAIL FEMORAL (Left) ADDITIONAL DIAGNOSIS:  Expected Acute Blood Loss Anemia, Diabetes and Hypertension, CHF, CAD,CKD  Plan: PT/OT WBAT, THA, will transfuse 1 unit of packed red blood cells for hemoglobin of 7.  DVT Prophylaxis: SCDx72 hrs, ASA 81 mg BID x 2 weeks  DISCHARGE PLAN: Skilled Nursing Facility/Rehab  DISCHARGE NEEDS: HHPT, Walker and 3-in-1 comode seat  Expected Discharge Date:                  Expected Discharge Plan:  Skilled Nursing Facility  In-House Referral:  Clinical Social Work  Discharge planning Services  CM Consult  Post Acute Care Choice:    Choice offered to:     DME Arranged:    DME Agency:     HH Arranged:    [place] Agency:     Status of Service:  In process, will continue to follow  If discussed at Long Length of Stay Meetings, dates discussed:    Additional Comments:  Leeroy Cha, RN 08/21/2018, 12:52 PM

## 2018-08-21 NOTE — Progress Notes (Signed)
PROGRESS NOTE [facility]   [name]   MPN:[id number] DOB: [birth year]  DOA: 08/19/2018 PCP: Mayra Neer, MD   Brief Narrative:  [name] is a 84 year old female with medical history significant of hypertension, diabetes mellitus type 2, chronic systolic CHF, CAD, pulmonary hypertension, complete heart block status post pacemaker placement, and CKD stage III who presented to the emergency department after mechanical fall.  She was made in the ground for several hours due to inability to move her arm. Upon ED evaluation CK 1555, CT head negative for acute intracranial abnormality.  CT of C-spine did not show any acute bony fracture.  X-ray of the hip comminuted close subtrochanteric fracture of the legs firmer with lateral displacement.  She was admitted with working diagnosis of hip fracture.   Subjective: Patient seen and examined, she is complaining about pain of her lft ankle. Also feels tired. No acute events overnight.   Assessment & Plan:   Principal Problem:   Closed left hip fracture (HCC) Active Problems:   Hypertension   CKD (chronic kidney disease), stage III (HCC)   Coronary artery disease   Type II diabetes mellitus with renal manifestations (HCC)   Complete heart block (HCC)   Chronic combined systolic and diastolic CHF (congestive heart failure) (HCC)   Fall   Macrocytic anemia  Close left hip fracture Status post intramedullary femoral nail 8/21 PT/OT WBAT, pain control as needed. Continue postop management per orthopedic surgery  Chronic combined systolic and diastolic CHF Echo on 0/08 showed EF of 30 to 35% with grade 1 diastolic dysfunction. No signs of fluid overload, continue carvedilol and Entresto, and Lasix as needed.  Rhabdomyolysis CK improving. Continue to monitor  Blood loss anemia, iron deficiency anemia Due to procedure Will transfuse 1 unit of PRBC's Will start iron supplement Monitor hemoglobin in  a.m.  Hypertension Continue home medication  PAF She is not on anticoagulation due to frequent falls, continue beta-blocker  CKD stage III Creatinine at baseline Avoid nephrotoxic agent and hypertension Check renal function in a.m.  Type 2 diabetes mellitus Holding oral hypoglycemic agents, while n.p.o. Continue SSI, follow-up A1C  DVT prophylaxis: SCD's Code Status: DNR  Family Communication: Family at bedside  Disposition Plan: SNF once stable  Consultants:   Ortho   Cardio  Procedures:     Antimicrobials: Anti-infectives (From admission, onward)   Start     Dose/Rate Route Frequency Ordered Stop   08/20/18 1400  vancomycin (VANCOCIN) IVPB 1000 mg/200 mL premix     1,000 mg 200 mL/hr over 60 Minutes Intravenous On call to O.R. 08/20/18 1205 08/20/18 1516          Objective: Vitals:   08/21/18 0841 08/21/18 1036 08/21/18 1145 08/21/18 1200  BP: (!) 95/39 (!) 93/42 (!) 109/43   Pulse: (!) 59 65 62   Resp: 16     Temp: 98 F (36.7 C)  97.9 F (36.6 C)   TempSrc: Oral  Oral   SpO2: 100%  98%   Weight:    74.4 kg  Height:        Intake/Output Summary (Last 24 hours) at 08/21/2018 1433 Last data filed at 08/21/2018 1145 Gross per 24 hour  Intake 3261.88 ml  Output 1250 ml  Net 2011.88 ml   Filed Weights   08/19/18 1656 08/19/18 2315 08/21/18 1200  Weight: 59 kg 58.8 kg 74.4 kg    Examination:  General: NAD Cardiovascular: RRR, S1/S2 +, no rubs, no gallops Respiratory: CTA bilaterally,  no wheezing, no rhonchi Abdominal: Soft, NT, ND, bowel sounds Extremities: no edema, pulses intact   Data Reviewed: I have personally reviewed following labs and imaging studies  CBC: Recent Labs  Lab 08/19/18 1715 08/20/18 1904 08/21/18 0615  WBC 10.0  --  10.9*  NEUTROABS 7.8*  --   --   HGB 9.4* 8.1* 7.0*  HCT 29.1* 24.1* 21.2*  MCV 102.1*  --  101.0*  PLT 170  --  998*   Basic Metabolic Panel: Recent Labs  Lab 08/19/18 1715 08/21/18 0615   NA 141 137  K 4.4 4.8  CL 110 108  CO2 23 20*  GLUCOSE 230* 307*  BUN 27* 27*  CREATININE 1.33* 1.08*  CALCIUM 9.4 8.6*   GFR: Estimated Creatinine Clearance: 32.5 mL/min (A) (by C-G formula based on SCr of 1.08 mg/dL (H)). Liver Function Tests: Recent Labs  Lab 08/19/18 1715  AST 57*  ALT 24  ALKPHOS 63  BILITOT 1.3*  PROT 5.7*  ALBUMIN 3.2*   No results for input(s): LIPASE, AMYLASE in the last 168 hours. No results for input(s): AMMONIA in the last 168 hours. Coagulation Profile: Recent Labs  Lab 08/21/18 0748  INR 1.10   Cardiac Enzymes: Recent Labs  Lab 08/19/18 1715 08/21/18 0748  CKTOTAL 1,555* 1,016*   BNP (last 3 results) No results for input(s): PROBNP in the last 8760 hours. HbA1C: No results for input(s): HGBA1C in the last 72 hours. CBG: Recent Labs  Lab 08/20/18 1535 08/20/18 1740 08/20/18 2219 08/21/18 0754 08/21/18 1209  GLUCAP 138* 136* 299* 316* 239*   Lipid Profile: No results for input(s): CHOL, HDL, LDLCALC, TRIG, CHOLHDL, LDLDIRECT in the last 72 hours. Thyroid Function Tests: No results for input(s): TSH, T4TOTAL, FREET4, T3FREE, THYROIDAB in the last 72 hours. Anemia Panel: Recent Labs    08/21/18 0748  VITAMINB12 (NOTE)  FOLATE 16.0  FERRITIN 146  TIBC 228*  IRON 17*  RETICCTPCT 2.2   Sepsis Labs: No results for input(s): PROCALCITON, LATICACIDVEN in the last 168 hours.  Recent Results (from the past 240 hour(s))  Surgical PCR screen     Status: None   Collection Time: 08/20/18  1:22 AM  Result Value Ref Range Status   MRSA, PCR NEGATIVE NEGATIVE Final   Staphylococcus aureus NEGATIVE NEGATIVE Final    Comment: (NOTE) The Xpert SA Assay (FDA approved for NASAL specimens in patients 64 years of age and older), is one component of a comprehensive surveillance program. It is not intended to diagnose infection nor to guide or monitor treatment. Performed at [facility], [place] [address], [place], [place] [address]      Radiology Studies: Dg Chest 1 View  Result Date: 08/19/2018 CLINICAL DATA:  Patient fell last evening at 1839 hours and was found on the floor today. Syncopal episode. EXAM: CHEST  1 VIEW COMPARISON:  11/17/2015 FINDINGS: Heart and mediastinal contours are stable and within normal limits with post CABG change and mitral valvular replacement. Mild aortic atherosclerosis without aneurysm. ICD device projects over the left axilla with leads in the right atrium and right ventricle. Lungs are clear without acute pulmonary consolidations. Osteoarthritis of the right glenohumeral joint with high-riding humeral heads bilaterally. Remodeled appearance of the undersurface a the distal right clavicle. Findings likely reflect chronic rotator cuff tear. Differential possibility may include rheumatoid arthritis. IMPRESSION: No active cardiopulmonary disease. Aortic atherosclerosis. Electronically Signed   By: Ashley Royalty M.D.   On: 08/19/2018 18:11   Ct Head  Wo Contrast  Result Date: 08/19/2018 CLINICAL DATA:  Patient slipped and fell and was found on the floor. EXAM: CT HEAD WITHOUT CONTRAST CT CERVICAL SPINE WITHOUT CONTRAST TECHNIQUE: Multidetector CT imaging of the head and cervical spine was performed following the standard protocol without intravenous contrast. Multiplanar CT image reconstructions of the cervical spine were also generated. COMPARISON:  11/18/2017 and 11/16/2015 FINDINGS: CT HEAD FINDINGS BRAIN: There is mild to moderate sulcal and ventricular prominence consistent with superficial and central atrophy. No intraparenchymal hemorrhage, mass effect nor midline shift. Periventricular and subcortical white matter hypodensities consistent with mild-to-moderate chronic small vessel ischemic disease are identified. No acute large vascular territory infarcts. No abnormal extra-axial fluid collections. Basal cisterns are not effaced and midline. Intact brainstem and  cerebellum. VASCULAR: Moderate calcific atherosclerosis of the carotid siphons. SKULL: No skull fracture. No significant scalp soft tissue swelling. SINUSES/ORBITS: The mastoid air-cells are clear. The included paranasal sinuses are well-aerated.The included ocular globes and orbital contents are non-suspicious. OTHER: None. CT CERVICAL SPINE FINDINGS Alignment: There is maintained cervical lordosis Skull base and vertebrae: Osteoarthritis of the atlantodental interval with soft tissue pannus posterior to the odontoid process and subcortical erosive change is redemonstrated. Soft tissues and spinal canal: No prevertebral fluid or swelling. No visible canal hematoma. Disc levels: Minimal anterolisthesis grade 1 of C3 on C4. Moderate disc flattening at C5-6 and C6-7. Bilateral uncovertebral joint osteoarthritis is seen at C3-4, C4-5 to a greater extent C5-6 and C6-7. Multilevel degenerative facet arthropathy is seen. Small central disc protrusion C2-3. Central to right central disc bulge at C3-4, mild broad-based disc bulge C4-5, and disc-osteophyte complex at C5-6. Upper chest: Negative. Other: None IMPRESSION: Head CT: 1. Atrophy with chronic small vessel ischemia. No acute intracranial abnormality. 2. Cervical spondylosis without acute cervical spine fracture. Multilevel degenerative disc and facet arthropathy. Electronically Signed   By: Ashley Royalty M.D.   On: 08/19/2018 18:30   Ct Cervical Spine Wo Contrast  Result Date: 08/19/2018 CLINICAL DATA:  Patient slipped and fell and was found on the floor. EXAM: CT HEAD WITHOUT CONTRAST CT CERVICAL SPINE WITHOUT CONTRAST TECHNIQUE: Multidetector CT imaging of the head and cervical spine was performed following the standard protocol without intravenous contrast. Multiplanar CT image reconstructions of the cervical spine were also generated. COMPARISON:  11/18/2017 and 11/16/2015 FINDINGS: CT HEAD FINDINGS BRAIN: There is mild to moderate sulcal and ventricular  prominence consistent with superficial and central atrophy. No intraparenchymal hemorrhage, mass effect nor midline shift. Periventricular and subcortical white matter hypodensities consistent with mild-to-moderate chronic small vessel ischemic disease are identified. No acute large vascular territory infarcts. No abnormal extra-axial fluid collections. Basal cisterns are not effaced and midline. Intact brainstem and cerebellum. VASCULAR: Moderate calcific atherosclerosis of the carotid siphons. SKULL: No skull fracture. No significant scalp soft tissue swelling. SINUSES/ORBITS: The mastoid air-cells are clear. The included paranasal sinuses are well-aerated.The included ocular globes and orbital contents are non-suspicious. OTHER: None. CT CERVICAL SPINE FINDINGS Alignment: There is maintained cervical lordosis Skull base and vertebrae: Osteoarthritis of the atlantodental interval with soft tissue pannus posterior to the odontoid process and subcortical erosive change is redemonstrated. Soft tissues and spinal canal: No prevertebral fluid or swelling. No visible canal hematoma. Disc levels: Minimal anterolisthesis grade 1 of C3 on C4. Moderate disc flattening at C5-6 and C6-7. Bilateral uncovertebral joint osteoarthritis is seen at C3-4, C4-5 to a greater extent C5-6 and C6-7. Multilevel degenerative facet arthropathy is seen. Small central disc protrusion C2-3. Central to right central  disc bulge at C3-4, mild broad-based disc bulge C4-5, and disc-osteophyte complex at C5-6. Upper chest: Negative. Other: None IMPRESSION: Head CT: 1. Atrophy with chronic small vessel ischemia. No acute intracranial abnormality. 2. Cervical spondylosis without acute cervical spine fracture. Multilevel degenerative disc and facet arthropathy. Electronically Signed   By: Ashley Royalty M.D.   On: 08/19/2018 18:30   Dg C-arm 1-60 Min-no Report  Result Date: 08/20/2018 Fluoroscopy was utilized by the requesting physician.  No  radiographic interpretation.   Dg Hip Unilat With Pelvis 2-3 Views Left  Result Date: 08/19/2018 CLINICAL DATA:  Patient fell last evening was found on the floor today. Foreshortened left leg. EXAM: DG HIP (WITH OR WITHOUT PELVIS) 2-3V LEFT COMPARISON:  None. FINDINGS: AP view the pelvis as well as AP and frog-leg views of the left hip were provided. There is a comminuted, closed subtrochanteric fracture of the left femur with lateral displacement of the main fracture fragment by 1 shaft width and 1/2 shaft width medial displacement of a fracture fragment that includes a portion of the lesser trochanter. A segmental fracture fragment is seen superimposed over the proximal femoral diaphysis measuring 6.1 x 3.4 cm. No acute fracture of the right hip with cemented right total hip arthroplasty. The bony pelvis appears intact. IMPRESSION: There is a comminuted, closed subtrochanteric fracture of the left femur with lateral displacement of the main fracture fragment by 1 shaft width and 1/2 shaft width medial displacement of a fracture fragment that includes a portion of the lesser trochanter. A segmental fracture fragment is seen superimposed over the proximal femoral diaphysis measuring 6.1 x 3.4 cm Intact right total hip arthroplasty. Electronically Signed   By: Ashley Royalty M.D.   On: 08/19/2018 18:19   Dg Femur 1v Left  Result Date: 08/20/2018 CLINICAL DATA:  Operative fixation of a comminuted left femur subtrochanteric fracture. EXAM: LEFT FEMUR 1 VIEW; DG C-ARM 1-60 MIN-NO REPORT COMPARISON:  Yesterday. FINDINGS: Twelve C-arm views of the left femur demonstrate intramedullary rod and compression screw fixation of the previously demonstrated comminuted subtrochanteric fracture with cerclage wires. Essentially anatomic position and alignment of the fragments on these views. IMPRESSION: Hardware fixation of the previously demonstrated comminuted subtrochanteric left femur fracture. Electronically Signed   By:  Claudie Revering M.D.   On: 08/20/2018 17:25     Scheduled Meds: . carvedilol  12.5 mg Oral BID  . cholecalciferol  2,000 Units Oral Daily  . docusate sodium  100 mg Oral BID  . feeding supplement (ENSURE ENLIVE)  237 mL Oral BID BM  . insulin aspart  0-5 Units Subcutaneous QHS  . insulin aspart  0-9 Units Subcutaneous TID WC  . multivitamin with minerals  1 tablet Oral Daily  . mupirocin ointment  1 application Nasal BID  . omega-3 acid ethyl esters  1 g Oral BID  . oxybutynin  5 mg Oral BID  . sacubitril-valsartan  1 tablet Oral BID  . vitamin B-12  1,000 mcg Oral Daily   Continuous Infusions: . sodium chloride 50 mL/hr at 08/19/18 2233  . dextrose 5 % and 0.45 % NaCl with KCl 20 mEq/L 100 mL/hr at 08/20/18 2215     LOS: 2 days    Time spent: Total of 25 minutes spent with pt, greater than 50% of which was spent in discussion of  treatment, counseling and coordination of care   Chipper Oman, MD Pager: Text Page via [email or web address]   If 7PM-7AM, please contact night-coverage [email or web address] 08/21/2018, 2:33 PM  Note - This record has been created using [employer]. Chart creation errors have been sought, but may not always have been located. Such creation errors do not reflect on the standard of medical care.

## 2018-08-22 DIAGNOSIS — I5022 Chronic systolic (congestive) heart failure: Secondary | ICD-10-CM

## 2018-08-22 LAB — CBC
HCT: 24.8 % — ABNORMAL LOW (ref 36.0–46.0)
Hemoglobin: 8.2 g/dL — ABNORMAL LOW (ref 12.0–15.0)
MCH: 31.8 pg (ref 26.0–34.0)
MCHC: 33.1 g/dL (ref 30.0–36.0)
MCV: 96.1 fL (ref 78.0–100.0)
PLATELETS: 125 10*3/uL — AB (ref 150–400)
RBC: 2.58 MIL/uL — ABNORMAL LOW (ref 3.87–5.11)
RDW: 16.2 % — ABNORMAL HIGH (ref 11.5–15.5)
WBC: 8.8 10*3/uL (ref 4.0–10.5)

## 2018-08-22 LAB — GLUCOSE, CAPILLARY
GLUCOSE-CAPILLARY: 253 mg/dL — AB (ref 70–99)
GLUCOSE-CAPILLARY: 144 mg/dL — AB (ref 70–99)
GLUCOSE-CAPILLARY: 206 mg/dL — AB (ref 70–99)
Glucose-Capillary: 171 mg/dL — ABNORMAL HIGH (ref 70–99)

## 2018-08-22 LAB — BASIC METABOLIC PANEL
Anion gap: 5 (ref 5–15)
BUN: 27 mg/dL — AB (ref 8–23)
CO2: 25 mmol/L (ref 22–32)
CREATININE: 1.17 mg/dL — AB (ref 0.44–1.00)
Calcium: 8.4 mg/dL — ABNORMAL LOW (ref 8.9–10.3)
Chloride: 108 mmol/L (ref 98–111)
GFR calc Af Amer: 47 mL/min — ABNORMAL LOW (ref >60)
GFR, EST NON AFRICAN AMERICAN: 40 mL/min — AB (ref >60)
Glucose, Bld: 258 mg/dL — ABNORMAL HIGH (ref 70–99)
Potassium: 4.3 mmol/L (ref 3.5–5.1)
SODIUM: 138 mmol/L (ref 135–145)

## 2018-08-22 NOTE — Care Management Important Message (Signed)
Important Message  Patient Details  Name: [name] MRN: [id number] Date of Birth: [birth year]   Medicare Important Message Given:  Yes    Kerin Salen 08/22/2018, 10:40 AMImportant Message  Patient Details  Name: [name] MRN: [id number] Date of Birth: Jun 21, 1928   Medicare Important Message Given:  Yes    Kerin Salen 08/22/2018, 10:39 AMImportant Message  Patient Details  Name: [name] MRN: [id number] Date of Birth: 02-08-1928   Medicare Important Message Given:  Yes    Kerin Salen 08/22/2018, 10:37 AM

## 2018-08-22 NOTE — Progress Notes (Signed)
PROGRESS NOTE    GLENDELL [name]  VXB:[id number] DOB: 08-15-1928 DOA: 08/19/2018 PCP: Mayra Neer, MD    Brief Narrative:  84 year old female who presented with left hip pain after a mechanical fall.  She does have significant past medical history for hypertension, dyslipidemia, type 2 diabetes mellitus, CVA, systolic heart failure with ejection fraction 30%, complete heart block status post pacemaker, coronary artery disease, pulmonary hypertension and chronic kidney disease stage III.  Patient sustained a mechanical fall from her own height, landing on her left hip, developing severe pain and ambulatory dysfunction.  Left hip/pelvis radiographs showed soup trochanteric fracture of the left femur with lateral displacement of the main fracture fragment by one shaft width and half shaft width medial displacement of fracture fragment that includes a portion of the lesser trochanter.  Patient was admitted to the hospital with working diagnosis of acute closed left hip fracture, complicated by ambulatory dysfunction.   Assessment & Plan:   Principal Problem:   Closed left hip fracture (HCC) Active Problems:   Hypertension   CKD (chronic kidney disease), stage III (HCC)   Coronary artery disease   Type II diabetes mellitus with renal manifestations (HCC)   Complete heart block (HCC)   Chronic combined systolic and diastolic CHF (congestive heart failure) (HCC)   Fall   Macrocytic anemia   1. Left hip fracture sp intramedullary nail. Will continue pain control, physical therapy and dvt prophylaxis. Pending placement at SNF. Continue pain control with hydrocodone and IV morphine. Follow orthopedic recommendations.   2. Chronic and stable systolic heart failure. Clinically euvolemic, will continue entresto, and carvedilol. Holding furosemide for now. Hold on IV fluids.   3. Paroxysmal atrial fibrillation. Continue rate control with carvedilol, not on anticoagulation due to risk of falls and  bleeding.   4. Stage 3 CKD. Follow on renal function and electrolytes, avoid hypotension and nephrotoxic medications. Stable serum cr at 1,0 to 1,17. K at 4,3 and serum bicarbonate at 25.   5. T2DM. Will continue insulin sliding scale for glucose cover and monitoring. Patient is tolerating po well.   6. HTN. Continue blood pressure control with carvedilol and entresto.   7. Post op acute blood loss anemia combined with iron deficiency. Patient sp one unit prbc transfusion, continue iron supplementation. Hb and hct stable today.     DVT prophylaxis: scd  Code Status:  dnr  Family Communication: no family at the bedside  Disposition Plan/ discharge barriers:  Pending snf placement    Consultants:   Orthopedics   Procedures:   Left intramedullary nail  Antimicrobials:       Subjective: Patient complains of left hip pain, moderate to severe, persistent worse to the touch and movement, no associated symptoms, no nausea or vomiting.   Objective: Vitals:   08/21/18 1505 08/21/18 2034 08/22/18 0524 08/22/18 1040  BP: (!) 108/43 (!) 102/53 (!) 90/42 (!) 105/43  Pulse: 60 69 71 (!) 59  Resp: 14 18 19    Temp: 98.4 F (36.9 C) 98.4 F (36.9 C) 98.3 F (36.8 C)   TempSrc: Oral Oral Oral   SpO2: 98% 94% 97%   Weight:   83.5 kg   Height:        Intake/Output Summary (Last 24 hours) at 08/22/2018 1206 Last data filed at 08/22/2018 0900 Gross per 24 hour  Intake 1134.03 ml  Output 1050 ml  Net 84.03 ml   Filed Weights   08/19/18 2315 08/21/18 1200 08/22/18 0524  Weight: 58.8 kg 74.4  kg 83.5 kg    Examination:   General: deconditioned and ill looking appearing  Neurology: Awake and alert, non focal/ disorientated   E ENT: mil pallor, no icterus, oral mucosa moist Cardiovascular: No JVD. S1-S2 present, rhythmic, no gallops, rubs, or murmurs. No lower extremity edema. Pulmonary: positive breath sounds bilaterally, adequate air movement, no wheezing, rhonchi or  rales. Gastrointestinal. Abdomen with no organomegaly, non tender, no rebound or guarding Skin. No rashes Musculoskeletal: no joint deformities     Data Reviewed: I have personally reviewed following labs and imaging studies  CBC: Recent Labs  Lab 08/19/18 1715 08/20/18 1904 08/21/18 0615 08/22/18 0536  WBC 10.0  --  10.9* 8.8  NEUTROABS 7.8*  --   --   --   HGB 9.4* 8.1* 7.0* 8.2*  HCT 29.1* 24.1* 21.2* 24.8*  MCV 102.1*  --  101.0* 96.1  PLT 170  --  128* 630*   Basic Metabolic Panel: Recent Labs  Lab 08/19/18 1715 08/21/18 0615 08/22/18 0536  NA 141 137 138  K 4.4 4.8 4.3  CL 110 108 108  CO2 23 20* 25  GLUCOSE 230* 307* 258*  BUN 27* 27* 27*  CREATININE 1.33* 1.08* 1.17*  CALCIUM 9.4 8.6* 8.4*   GFR: Estimated Creatinine Clearance: 31.8 mL/min (A) (by C-G formula based on SCr of 1.17 mg/dL (H)). Liver Function Tests: Recent Labs  Lab 08/19/18 1715  AST 57*  ALT 24  ALKPHOS 63  BILITOT 1.3*  PROT 5.7*  ALBUMIN 3.2*   No results for input(s): LIPASE, AMYLASE in the last 168 hours. No results for input(s): AMMONIA in the last 168 hours. Coagulation Profile: Recent Labs  Lab 08/21/18 0748  INR 1.10   Cardiac Enzymes: Recent Labs  Lab 08/19/18 1715 08/21/18 0748  CKTOTAL 1,555* 1,016*   BNP (last 3 results) No results for input(s): PROBNP in the last 8760 hours. HbA1C: No results for input(s): HGBA1C in the last 72 hours. CBG: Recent Labs  Lab 08/21/18 1209 08/21/18 1659 08/21/18 2038 08/22/18 0746 08/22/18 1159  GLUCAP 239* 165* 133* 253* 171*   Lipid Profile: No results for input(s): CHOL, HDL, LDLCALC, TRIG, CHOLHDL, LDLDIRECT in the last 72 hours. Thyroid Function Tests: No results for input(s): TSH, T4TOTAL, FREET4, T3FREE, THYROIDAB in the last 72 hours. Anemia Panel: Recent Labs    08/21/18 0748  VITAMINB12 (NOTE)  FOLATE 16.0  FERRITIN 146  TIBC 228*  IRON 17*  RETICCTPCT 2.2      Radiology Studies: I have  reviewed all of the imaging during this hospital visit personally     Scheduled Meds: . carvedilol  12.5 mg Oral BID  . cholecalciferol  2,000 Units Oral Daily  . docusate sodium  100 mg Oral BID  . feeding supplement (ENSURE ENLIVE)  237 mL Oral BID BM  . insulin aspart  0-5 Units Subcutaneous QHS  . insulin aspart  0-9 Units Subcutaneous TID WC  . iron polysaccharides  150 mg Oral Daily  . multivitamin with minerals  1 tablet Oral Daily  . omega-3 acid ethyl esters  1 g Oral BID  . oxybutynin  5 mg Oral BID  . sacubitril-valsartan  1 tablet Oral BID  . vitamin B-12  1,000 mcg Oral Daily   Continuous Infusions: . dextrose 5 % and 0.45 % NaCl with KCl 20 mEq/L 50 mL/hr at 08/22/18 0428     LOS: 3 days        Mozella Rexrode Gerome Apley, MD [facility] Pager [phone]

## 2018-08-22 NOTE — Progress Notes (Signed)
Inpatient Diabetes Program Recommendations  AACE/ADA: New Consensus Statement on Inpatient Glycemic Control (2015)  Target Ranges:  Prepandial:   less than 140 mg/dL      Peak postprandial:   less than 180 mg/dL (1-2 hours)      Critically ill patients:  140 - 180 mg/dL   Lab Results  Component Value Date   GLUCAP 253 (H) 08/22/2018    Review of Glycemic Control  Diabetes history: DM2 Outpatient Diabetes medications: glipizide 20 mg QD Current orders for Inpatient glycemic control: Novolog 0-9 units tidwc and hs  No HgbA1C results. H/H low therefore doubt it would be accurate.  FBS x 2 days > 180 mg/dL.  Inpatient Diabetes Program Recommendations:     Add Lantus 10 units QHS.  Will continue to monitor closely.  Thank you. Lorenda Peck, RD, LDN, CDE Inpatient Diabetes Coordinator [phone]

## 2018-08-22 NOTE — Progress Notes (Signed)
PATIENT ID: [name]  MRN: [id number]  DOB/AGE:  09/08/1928 / 84 y.o.  2 Days Post-Op Procedure(s) (LRB): INTRAMEDULLARY (IM) NAIL FEMORAL (Left)    PROGRESS NOTE Subjective: Patient is alert, oriented, no Nausea, no Vomiting, yes passing gas, . Taking PO well. Denies SOB, Chest or Calf Pain. Using Incentive Spirometer, PAS in place. Ambulate 50 % weight bearing on left leg with pt working on transfers yesterday. Patient reports pain as  moderate .    Objective: Vital signs in last 24 hours: Vitals:   08/21/18 1200 08/21/18 1505 08/21/18 2034 08/22/18 0524  BP:  (!) 108/43 (!) 102/53 (!) 90/42  Pulse:  60 69 71  Resp:  14 18 19   Temp:  98.4 F (36.9 C) 98.4 F (36.9 C) 98.3 F (36.8 C)  TempSrc:  Oral Oral Oral  SpO2:  98% 94% 97%  Weight: 74.4 kg   83.5 kg  Height:          Intake/Output from previous day: I/O last 3 completed shifts: In: 2268.9 [P.O.:540; I.V.:1398.9; Blood:330] Out: 1400 [KYHCW:2376]   Intake/Output this shift: No intake/output data recorded.   LABORATORY DATA: Recent Labs    08/21/18 0615 08/21/18 0748  08/21/18 1659 08/21/18 2038 08/22/18 0536 08/22/18 0746  WBC 10.9*  --   --   --   --  8.8  --   HGB 7.0*  --   --   --   --  8.2*  --   HCT 21.2*  --   --   --   --  24.8*  --   PLT 128*  --   --   --   --  125*  --   NA 137  --   --   --   --  138  --   K 4.8  --   --   --   --  4.3  --   CL 108  --   --   --   --  108  --   CO2 20*  --   --   --   --  25  --   BUN 27*  --   --   --   --  27*  --   CREATININE 1.08*  --   --   --   --  1.17*  --   GLUCOSE 307*  --   --   --   --  258*  --   GLUCAP  --   --    < > 165* 133*  --  253*  INR  --  1.10  --   --   --   --   --   CALCIUM 8.6*  --   --   --   --  8.4*  --    < > = values in this interval not displayed.    Examination: Neurologically intact Neurovascular intact Sensation intact distally Intact pulses distally Dorsiflexion/Plantar flexion intact Incision: dressing  C/D/I No cellulitis present Compartment soft} XR AP&Lat of hip shows well placed\fixed THA  Assessment:   2 Days Post-Op Procedure(s) (LRB): INTRAMEDULLARY (IM) NAIL FEMORAL (Left) ADDITIONAL DIAGNOSIS:  Expected Acute Blood Loss Anemia,  Diabetes and Hypertension, CHF, CAD,CKD  Plan: PT/OT 50 % weight bearing to left lower extremity  DVT Prophylaxis: SCDx72 hrs, ASA 81 mg BID x 2 weeks  DISCHARGE PLAN: Skilled Nursing Facility/Rehab, when bed available and cleared by medicine  DISCHARGE NEEDS: HHPT, Walker and 3-in-1 comode  seat

## 2018-08-22 NOTE — Progress Notes (Signed)
Patient's defaulted to daughter for bed choice.   Family chose bed at [facility]. Family agreeable to semi private room until patietn can be moved next week.   Facility started [place].   LCSW will continue to follow.   [name]

## 2018-08-22 NOTE — Care Management Note (Signed)
Case Management Note  Patient Details  Name: [name] MRN: [id number] Date of Birth: 1928/03/28  Subjective/Objective:                   discharge to [facility]  Action/Plan: No cm needs present  Expected Discharge Date:                  Expected Discharge Plan:  [place]  In-House Referral:  Clinical Social Work  Discharge planning Services  CM Consult  Post Acute Care Choice:    Choice offered to:     DME Arranged:    DME Agency:     HH Arranged:    [place] Agency:     Status of Service:  In process, will continue to follow  If discussed at Long Length of Stay Meetings, dates discussed:    Additional Comments:  Leeroy Cha, RN 08/22/2018, 2:30 PM

## 2018-08-23 DIAGNOSIS — I442 Atrioventricular block, complete: Secondary | ICD-10-CM

## 2018-08-23 DIAGNOSIS — S72002A Fracture of unspecified part of neck of left femur, initial encounter for closed fracture: Secondary | ICD-10-CM

## 2018-08-23 DIAGNOSIS — N183 Chronic kidney disease, stage 3 (moderate): Secondary | ICD-10-CM

## 2018-08-23 LAB — TYPE AND SCREEN
ABO/RH(D): O POS
Antibody Screen: NEGATIVE
UNIT DIVISION: 0
Unit division: 0

## 2018-08-23 LAB — CBC
HEMATOCRIT: 27 % — AB (ref 36.0–46.0)
Hemoglobin: 8.9 g/dL — ABNORMAL LOW (ref 12.0–15.0)
MCH: 32.2 pg (ref 26.0–34.0)
MCHC: 33 g/dL (ref 30.0–36.0)
MCV: 97.8 fL (ref 78.0–100.0)
Platelets: 140 10*3/uL — ABNORMAL LOW (ref 150–400)
RBC: 2.76 MIL/uL — ABNORMAL LOW (ref 3.87–5.11)
RDW: 15.6 % — AB (ref 11.5–15.5)
WBC: 7.8 10*3/uL (ref 4.0–10.5)

## 2018-08-23 LAB — GLUCOSE, CAPILLARY
GLUCOSE-CAPILLARY: 208 mg/dL — AB (ref 70–99)
GLUCOSE-CAPILLARY: 268 mg/dL — AB (ref 70–99)
Glucose-Capillary: 212 mg/dL — ABNORMAL HIGH (ref 70–99)
Glucose-Capillary: 196 mg/dL — ABNORMAL HIGH (ref 70–99)

## 2018-08-23 LAB — BASIC METABOLIC PANEL
ANION GAP: 5 (ref 5–15)
BUN: 22 mg/dL (ref 8–23)
CALCIUM: 8.8 mg/dL — AB (ref 8.9–10.3)
CO2: 27 mmol/L (ref 22–32)
Chloride: 107 mmol/L (ref 98–111)
Creatinine, Ser: 1.05 mg/dL — ABNORMAL HIGH (ref 0.44–1.00)
GFR calc non Af Amer: 46 mL/min — ABNORMAL LOW (ref >60)
GFR, EST AFRICAN AMERICAN: 53 mL/min — AB (ref >60)
GLUCOSE: 236 mg/dL — AB (ref 70–99)
Potassium: 5.1 mmol/L (ref 3.5–5.1)
Sodium: 139 mmol/L (ref 135–145)

## 2018-08-23 LAB — BPAM RBC
BLOOD PRODUCT EXPIRATION DATE: 201909202359
Blood Product Expiration Date: 201909202359
ISSUE DATE / TIME: 201908220825
UNIT TYPE AND RH: 5100
Unit Type and Rh: 5100

## 2018-08-23 MED ORDER — POLYETHYLENE GLYCOL 3350 17 G PO PACK
17.0000 g | PACK | Freq: Every day | ORAL | Status: DC
  Administered 2018-08-23 – 2018-08-25 (×2): 17 g via ORAL
  Filled 2018-08-23 (×3): qty 1

## 2018-08-23 MED ORDER — BISACODYL 5 MG PO TBEC
10.0000 mg | DELAYED_RELEASE_TABLET | Freq: Every day | ORAL | Status: AC
  Administered 2018-08-23 – 2018-08-24 (×2): 10 mg via ORAL
  Filled 2018-08-23 (×2): qty 2

## 2018-08-23 MED ORDER — BISACODYL 10 MG RE SUPP
10.0000 mg | Freq: Once | RECTAL | Status: DC
  Filled 2018-08-23: qty 1

## 2018-08-23 MED ORDER — INSULIN GLARGINE 100 UNIT/ML ~~LOC~~ SOLN
8.0000 [IU] | Freq: Every day | SUBCUTANEOUS | Status: DC
  Administered 2018-08-23 – 2018-08-24 (×2): 8 [IU] via SUBCUTANEOUS
  Filled 2018-08-23 (×3): qty 0.08

## 2018-08-23 NOTE — Progress Notes (Signed)
PROGRESS NOTE    [name]  HYQ:[id number] DOB: 1928-08-27 DOA: 08/19/2018 PCP: Mayra Neer, MD  Brief Narrative: 84 year old female who presented with left hip pain after a mechanical fall.  She does have significant past medical history for hypertension, dyslipidemia, type 2 diabetes mellitus, CVA, systolic heart failure with ejection fraction 30%, complete heart block status post pacemaker, coronary artery disease, pulmonary hypertension and chronic kidney disease stage III.  Patient sustained a mechanical fall from her own height, landing on her left hip, developing severe pain and ambulatory dysfunction.  Left hip/pelvis radiographs showed soup trochanteric fracture of the left femur with lateral displacement of the main fracture fragment by one shaft width and half shaft width medial displacement of fracture fragment that includes a portion of the lesser trochanter.  Patient was admitted to the hospital with working diagnosis of acute closed left hip fracture, complicated by ambulatory dysfunction.  Assessment & Plan:   Principal Problem:   Closed left hip fracture (HCC) Active Problems:   Hypertension   CKD (chronic kidney disease), stage III (HCC)   Coronary artery disease   Type II diabetes mellitus with renal manifestations (HCC)   Complete heart block (HCC)   Chronic combined systolic and diastolic CHF (congestive heart failure) (HCC)   Fall   Macrocytic anemia  1. Left hip fracture sp intramedullary nail. Will continue pain control, physical therapy and dvt prophylaxis with SCD for 72 hours and aspirin 81 mg twice a day for 2 weeks.  Per Ortho.  Insurance authorization started to place her.  Pending placement at SNF. Continue pain control with hydrocodone and IV morphine. Follow orthopedic recommendations.   2. Chronic and stable systolic heart failure. Clinically euvolemic, will continue entresto, and carvedilol. Holding furosemide for now.   Will DC IV fluids.   Restart Lasix prior to discharge.    3. Paroxysmal atrial fibrillation. Continue rate control with carvedilol, not on anticoagulation due to risk of falls and bleeding.   4. Stage 3 CKD. Follow on renal function and electrolytes, avoid hypotension and nephrotoxic medications. Stable serum cr at 1,05 to 1,17. K at 4,3 and serum bicarbonate at 25.   5. T2DM. Will continue insulin sliding scale for glucose cover and monitoring.  Restart Lantus.  6. HTN. Continue blood pressure control with carvedilol and entresto.   7. Post op acute blood loss anemia combined with iron deficiency. Patient sp one unit prbc transfusion, continue iron supplementation. Hb and hct stable today.   8 constipation stool softeners    DVT prophylaxis: SCD Code Status: DNR  Family Communication NONE  Disposition Plan:  Plan discharge to SNF   Consultants: Orthopedics  Procedures: Left hip pinning Antimicrobials none  Subjective: Complains of difficulty moving bowels.  Denies any nausea vomiting.  Denies abdominal pain. Objective: Vitals:   08/22/18 1040 08/22/18 1401 08/22/18 2013 08/23/18 0539  BP: (!) 105/43 (!) 113/51 (!) 127/59 (!) 134/47  Pulse: (!) 59 62 64 61  Resp:  15 20 19   Temp:  98.3 F (36.8 C) 99.8 F (37.7 C) 98.6 F (37 C)  TempSrc:   Oral   SpO2:  97% 95% 94%  Weight:      Height:        Intake/Output Summary (Last 24 hours) at 08/23/2018 1204 Last data filed at 08/23/2018 0848 Gross per 24 hour  Intake 120 ml  Output 1075 ml  Net -955 ml   Filed Weights   08/19/18 2315 08/21/18 1200 08/22/18 0524  Weight: 58.8  kg 74.4 kg 83.5 kg    Examination:  General exam: Appears calm and comfortable  Respiratory system: Clear to auscultation. Respiratory effort normal. Cardiovascular system: S1 & S2 heard, RRR. No JVD, murmurs, rubs, gallops or clicks. No pedal edema. Gastrointestinal system: Abdomen is nondistended, soft and nontender. No organomegaly or masses felt.  Normal bowel sounds heard. Central nervous system: Alert and oriented. No focal neurological deficits. Extremities: Left hip occlusive dressing in place with no evidence of erythema. Skin: No rashes, lesions or ulcers Psychiatry: Judgement and insight appear normal. Mood & affect appropriate.     Data Reviewed: I have personally reviewed following labs and imaging studies  CBC: Recent Labs  Lab 08/19/18 1715 08/20/18 1904 08/21/18 0615 08/22/18 0536 08/23/18 0558  WBC 10.0  --  10.9* 8.8 7.8  NEUTROABS 7.8*  --   --   --   --   HGB 9.4* 8.1* 7.0* 8.2* 8.9*  HCT 29.1* 24.1* 21.2* 24.8* 27.0*  MCV 102.1*  --  101.0* 96.1 97.8  PLT 170  --  128* 125* 366*   Basic Metabolic Panel: Recent Labs  Lab 08/19/18 1715 08/21/18 0615 08/22/18 0536 08/23/18 0558  NA 141 137 138 139  K 4.4 4.8 4.3 5.1  CL 110 108 108 107  CO2 23 20* 25 27  GLUCOSE 230* 307* 258* 236*  BUN 27* 27* 27* 22  CREATININE 1.33* 1.08* 1.17* 1.05*  CALCIUM 9.4 8.6* 8.4* 8.8*   GFR: Estimated Creatinine Clearance: 35.5 mL/min (A) (by C-G formula based on SCr of 1.05 mg/dL (H)). Liver Function Tests: Recent Labs  Lab 08/19/18 1715  AST 57*  ALT 24  ALKPHOS 63  BILITOT 1.3*  PROT 5.7*  ALBUMIN 3.2*   No results for input(s): LIPASE, AMYLASE in the last 168 hours. No results for input(s): AMMONIA in the last 168 hours. Coagulation Profile: Recent Labs  Lab 08/21/18 0748  INR 1.10   Cardiac Enzymes: Recent Labs  Lab 08/19/18 1715 08/21/18 0748  CKTOTAL 1,555* 1,016*   BNP (last 3 results) No results for input(s): PROBNP in the last 8760 hours. HbA1C: No results for input(s): HGBA1C in the last 72 hours. CBG: Recent Labs  Lab 08/22/18 0746 08/22/18 1159 08/22/18 1703 08/22/18 2117 08/23/18 0754  GLUCAP 253* 171* 144* 206* 212*   Lipid Profile: No results for input(s): CHOL, HDL, LDLCALC, TRIG, CHOLHDL, LDLDIRECT in the last 72 hours. Thyroid Function Tests: No results for  input(s): TSH, T4TOTAL, FREET4, T3FREE, THYROIDAB in the last 72 hours. Anemia Panel: Recent Labs    08/21/18 0748  VITAMINB12 (NOTE)  FOLATE 16.0  FERRITIN 146  TIBC 228*  IRON 17*  RETICCTPCT 2.2   Sepsis Labs: No results for input(s): PROCALCITON, LATICACIDVEN in the last 168 hours.  Recent Results (from the past 240 hour(s))  Surgical PCR screen     Status: None   Collection Time: 08/20/18  1:22 AM  Result Value Ref Range Status   MRSA, PCR NEGATIVE NEGATIVE Final   Staphylococcus aureus NEGATIVE NEGATIVE Final    Comment: (NOTE) The Xpert SA Assay (FDA approved for NASAL specimens in patients 73 years of age and older), is one component of a comprehensive surveillance program. It is not intended to diagnose infection nor to guide or monitor treatment. Performed at [facility], [place] [address], [place], [place] [address]          Radiology Studies: Dg Ankle Left Port  Result Date: 08/21/2018 CLINICAL DATA:  [place] on  08/20 with laceration and bruising distally EXAM: PORTABLE LEFT ANKLE - 2 VIEW COMPARISON:  None. FINDINGS: No acute fracture is seen. Alignment is normal. The tibiotalar joint space appears normal. IMPRESSION: Negative. Electronically Signed   By: Ivar Drape M.D.   On: 08/21/2018 16:00        Scheduled Meds: . carvedilol  12.5 mg Oral BID  . cholecalciferol  2,000 Units Oral Daily  . docusate sodium  100 mg Oral BID  . feeding supplement (ENSURE ENLIVE)  237 mL Oral BID BM  . insulin aspart  0-5 Units Subcutaneous QHS  . insulin aspart  0-9 Units Subcutaneous TID WC  . iron polysaccharides  150 mg Oral Daily  . multivitamin with minerals  1 tablet Oral Daily  . omega-3 acid ethyl esters  1 g Oral BID  . oxybutynin  5 mg Oral BID  . sacubitril-valsartan  1 tablet Oral BID  . vitamin B-12  1,000 mcg Oral Daily   Continuous Infusions:   LOS: 4 days     Georgette Shell, MD [facility]  If 7PM-7AM, please  contact night-coverage [email or web address] Password [facility] 08/23/2018, 12:04 PM

## 2018-08-23 NOTE — Progress Notes (Signed)
PATIENT ID: [name]  MRN: [id number]  DOB/AGE:  May 01, 1928 / 84 y.o.  3 Days Post-Op Procedure(s) (LRB): INTRAMEDULLARY (IM) NAIL FEMORAL (Left)    PROGRESS NOTE Subjective: Patient is alert, oriented, no Nausea, no Vomiting, yes passing gas--No BM since Tuesday . Taking PO well. Denies SOB, Chest or Calf Pain. Using Incentive Spirometer, PAS in place. Ambulate 50 % weight bearing on left leg Patient reports pain as  moderate . Awaiting SNF placement.    Objective: Vital signs in last 24 hours: Vitals:   08/22/18 1040 08/22/18 1401 08/22/18 2013 08/23/18 0539  BP: (!) 105/43 (!) 113/51 (!) 127/59 (!) 134/47  Pulse: (!) 59 62 64 61  Resp:  15 20 19   Temp:  98.3 F (36.8 C) 99.8 F (37.7 C) 98.6 F (37 C)  TempSrc:   Oral   SpO2:  97% 95% 94%  Weight:      Height:          Intake/Output from previous day: I/O last 3 completed shifts: In: 600 [P.O.:600] Out: 1375 [VCBSW:9675]   Intake/Output this shift: Total I/O In: 120 [P.O.:120] Out: -    LABORATORY DATA: Recent Labs    08/21/18 0748  08/22/18 0536  08/22/18 2117 08/23/18 0558 08/23/18 0754 08/23/18 1211  WBC  --   --  8.8  --   --  7.8  --   --   HGB  --   --  8.2*  --   --  8.9*  --   --   HCT  --   --  24.8*  --   --  27.0*  --   --   PLT  --   --  125*  --   --  140*  --   --   NA  --   --  138  --   --  139  --   --   K  --   --  4.3  --   --  5.1  --   --   CL  --   --  108  --   --  107  --   --   CO2  --   --  25  --   --  27  --   --   BUN  --   --  27*  --   --  22  --   --   CREATININE  --   --  1.17*  --   --  1.05*  --   --   GLUCOSE  --   --  258*  --   --  236*  --   --   GLUCAP  --    < >  --    < > 206*  --  212* 208*  INR 1.10  --   --   --   --   --   --   --   CALCIUM  --   --  8.4*  --   --  8.8*  --   --    < > = values in this interval not displayed.    Examination: Neurologically intact Neurovascular intact Sensation intact distally Intact pulses  distally Dorsiflexion/Plantar flexion intact Incision: dressing C/D/I No cellulitis present Compartment soft} XR AP&Lat of hip shows well placed\fixed THA  Assessment:   3 Days Post-Op Procedure(s) (LRB): INTRAMEDULLARY (IM) NAIL FEMORAL (Left) ADDITIONAL DIAGNOSIS:  Expected Acute Blood Loss Anemia,  Diabetes and Hypertension,  CHF, CAD,CKD  Plan: PT/OT 50 % weight bearing to left lower extremity  DVT Prophylaxis: SCDx72 hrs, ASA 81 mg BID x 2 weeks  DISCHARGE PLAN: Skilled Nursing Facility/Rehab, when bed available and cleared by medicine  DISCHARGE NEEDS: HHPT, Walker and 3-in-1 comode seat

## 2018-08-24 LAB — GLUCOSE, CAPILLARY
GLUCOSE-CAPILLARY: 152 mg/dL — AB (ref 70–99)
GLUCOSE-CAPILLARY: 141 mg/dL — AB (ref 70–99)
Glucose-Capillary: 199 mg/dL — ABNORMAL HIGH (ref 70–99)
Glucose-Capillary: 228 mg/dL — ABNORMAL HIGH (ref 70–99)

## 2018-08-24 NOTE — Progress Notes (Signed)
Blood glucose is taken at 16:45 pm from 1516  (157). The result showed under another patient account room (1513).

## 2018-08-24 NOTE — Progress Notes (Signed)
PROGRESS NOTE    [name]  YKD:[id number] DOB: Aug 11, 1928 DOA: 08/19/2018 PCP: Mayra Neer, MD   Brief Narrative:84 year old female who presented with left hip pain after a mechanical fall. She does have significant past medical history for hypertension, dyslipidemia, type 2 diabetes mellitus, CVA, systolic heart failure with ejection fraction 30%, complete heart block status post pacemaker, coronary artery disease, pulmonary hypertension and chronic kidney disease stage III. Patient sustained a mechanical fall from her own height, landing on her left hip, developing severe pain and ambulatory dysfunction. Left hip/pelvis radiographs showed soup trochanteric fracture of the left femur with lateral displacement of the main fracture fragment by one shaft width and half shaft width medial displacement of fracture fragment that includes a portion of the lesser trochanter.  Patient was admitted to the hospital with working diagnosis of acute closed left hip fracture, complicated by ambulatory dysfunction.  Assessment & Plan:   Principal Problem:   Closed left hip fracture (HCC) Active Problems:   Hypertension   CKD (chronic kidney disease), stage III (HCC)   Coronary artery disease   Type II diabetes mellitus with renal manifestations (HCC)   Complete heart block (HCC)   Chronic combined systolic and diastolic CHF (congestive heart failure) (HCC)   Fall   Macrocytic anemia  1. Left hip fracture sp intramedullary nail. Will continue pain control, physical therapy and dvt prophylaxis with SCD for 72 hours and aspirin 81 mg twice a day for 2 weeks.  Per Ortho.  Insurance authorization started to place her.  Pending placement at SNF. Continue pain control with hydrocodone and IV morphine. Follow orthopedic recommendations.   2. Chronic and stable systolic heart failure. Clinically euvolemic, will continue entresto, and carvedilol. Holding furosemide for now.  Will DC IV fluids.   Restart Lasix prior to discharge.    3. Paroxysmal atrial fibrillation. Continue rate control with carvedilol, not on anticoagulation due to risk of falls and bleeding.   4. Stage 3 CKD. Follow on renal function and electrolytes, avoid hypotension and nephrotoxic medications.Stable serum cr at 1,05 to 1,17. K at 4,3 and serum bicarbonate at 25.  5. T2DM. Will continue insulin sliding scale for glucose cover and monitoring.  Restart Lantus 8/24.better.  6. HTN. Continue blood pressure control with carvedilol and entresto.   7. Post op acute blood loss anemia combined with iron deficiency. Patient sp one unit prbc transfusion, continue iron supplementation. Hb and hct stable today.  8 constipation stool softeners    DVT prophylaxis:scd per ortho Code Status:dnr Family Communicationnone Disposition Plan Plan dc to snf  Consultants:  ortho Proceduresleft hip pinning Antimicrobials: none  Subjective: No new complaints had a bowel movement yesterday  Objective: Vitals:   08/23/18 0539 08/23/18 1356 08/23/18 2006 08/24/18 0602  BP: (!) 134/47 (!) 142/51 (!) 109/49   Pulse: 61 61 62   Resp: 19 18 19    Temp: 98.6 F (37 C) 97.8 F (36.6 C) 98.1 F (36.7 C)   TempSrc:  Oral Oral   SpO2: 94% 99% 94%   Weight:    82.1 kg  Height:        Intake/Output Summary (Last 24 hours) at 08/24/2018 1240 Last data filed at 08/24/2018 0800 Gross per 24 hour  Intake 340 ml  Output 1 ml  Net 339 ml   Filed Weights   08/21/18 1200 08/22/18 0524 08/24/18 0602  Weight: 74.4 kg 83.5 kg 82.1 kg    Examination:  General exam: Appears calm and comfortable  Respiratory  system: Clear to auscultation. Respiratory effort normal. Cardiovascular system: S1 & S2 heard, RRR. No JVD, murmurs, rubs, gallops or clicks. No pedal edema. Gastrointestinal system: Abdomen is nondistended, soft and nontender. No organomegaly or masses felt. Normal bowel sounds heard. Central nervous system: Alert  and oriented. No focal neurological deficits. Extremities: left hip dressings on clean dry. Skin: No rashes, lesions or ulcers Psychiatry: Judgement and insight appear normal. Mood & affect appropriate.     Data Reviewed: I have personally reviewed following labs and imaging studies  CBC: Recent Labs  Lab 08/19/18 1715 08/20/18 1904 08/21/18 0615 08/22/18 0536 08/23/18 0558  WBC 10.0  --  10.9* 8.8 7.8  NEUTROABS 7.8*  --   --   --   --   HGB 9.4* 8.1* 7.0* 8.2* 8.9*  HCT 29.1* 24.1* 21.2* 24.8* 27.0*  MCV 102.1*  --  101.0* 96.1 97.8  PLT 170  --  128* 125* 449*   Basic Metabolic Panel: Recent Labs  Lab 08/19/18 1715 08/21/18 0615 08/22/18 0536 08/23/18 0558  NA 141 137 138 139  K 4.4 4.8 4.3 5.1  CL 110 108 108 107  CO2 23 20* 25 27  GLUCOSE 230* 307* 258* 236*  BUN 27* 27* 27* 22  CREATININE 1.33* 1.08* 1.17* 1.05*  CALCIUM 9.4 8.6* 8.4* 8.8*   GFR: Estimated Creatinine Clearance: 35.1 mL/min (A) (by C-G formula based on SCr of 1.05 mg/dL (H)). Liver Function Tests: Recent Labs  Lab 08/19/18 1715  AST 57*  ALT 24  ALKPHOS 63  BILITOT 1.3*  PROT 5.7*  ALBUMIN 3.2*   No results for input(s): LIPASE, AMYLASE in the last 168 hours. No results for input(s): AMMONIA in the last 168 hours. Coagulation Profile: Recent Labs  Lab 08/21/18 0748  INR 1.10   Cardiac Enzymes: Recent Labs  Lab 08/19/18 1715 08/21/18 0748  CKTOTAL 1,555* 1,016*   BNP (last 3 results) No results for input(s): PROBNP in the last 8760 hours. HbA1C: No results for input(s): HGBA1C in the last 72 hours. CBG: Recent Labs  Lab 08/23/18 1211 08/23/18 1706 08/23/18 2120 08/24/18 0742 08/24/18 1137  GLUCAP 208* 196* 268* 199* 228*   Lipid Profile: No results for input(s): CHOL, HDL, LDLCALC, TRIG, CHOLHDL, LDLDIRECT in the last 72 hours. Thyroid Function Tests: No results for input(s): TSH, T4TOTAL, FREET4, T3FREE, THYROIDAB in the last 72 hours. Anemia Panel: No  results for input(s): VITAMINB12, FOLATE, FERRITIN, TIBC, IRON, RETICCTPCT in the last 72 hours. Sepsis Labs: No results for input(s): PROCALCITON, LATICACIDVEN in the last 168 hours.  Recent Results (from the past 240 hour(s))  Surgical PCR screen     Status: None   Collection Time: 08/20/18  1:22 AM  Result Value Ref Range Status   MRSA, PCR NEGATIVE NEGATIVE Final   Staphylococcus aureus NEGATIVE NEGATIVE Final    Comment: (NOTE) The Xpert SA Assay (FDA approved for NASAL specimens in patients 51 years of age and older), is one component of a comprehensive surveillance program. It is not intended to diagnose infection nor to guide or monitor treatment. Performed at [facility], [place] [address], [place], [place] [address]          Radiology Studies: No results found.      Scheduled Meds: . bisacodyl  10 mg Rectal Once  . carvedilol  12.5 mg Oral BID  . cholecalciferol  2,000 Units Oral Daily  . docusate sodium  100 mg Oral BID  . feeding supplement (ENSURE ENLIVE)  237 mL Oral BID BM  . insulin aspart  0-5 Units Subcutaneous QHS  . insulin aspart  0-9 Units Subcutaneous TID WC  . insulin glargine  8 Units Subcutaneous QHS  . iron polysaccharides  150 mg Oral Daily  . multivitamin with minerals  1 tablet Oral Daily  . omega-3 acid ethyl esters  1 g Oral BID  . oxybutynin  5 mg Oral BID  . polyethylene glycol  17 g Oral Daily  . sacubitril-valsartan  1 tablet Oral BID  . vitamin B-12  1,000 mcg Oral Daily   Continuous Infusions:   LOS: 5 days      Georgette Shell, MD [facility]  If 7PM-7AM, please contact night-coverage [email or web address] Password [facility] 08/24/2018, 12:40 PM

## 2018-08-24 NOTE — Progress Notes (Addendum)
Covering for Dr. Mayer Camel 8/24-8/25  PATIENT ID: [name]  MRN: [id number]  DOB/AGE:  1928-08-03 / 84 y.o.  4 Days Post-Op Procedure(s) (LRB): INTRAMEDULLARY (IM) NAIL FEMORAL (Left)    PROGRESS NOTE Subjective: Reports waiting on pain meds, pain 5/10, has visitors Ambulate 50 % weight bearing on left leg . Awaiting SNF placement.    Objective: Vital signs in last 24 hours: Vitals:   08/23/18 0539 08/23/18 1356 08/23/18 2006 08/24/18 0602  BP: (!) 134/47 (!) 142/51 (!) 109/49   Pulse: 61 61 62   Resp: 19 18 19    Temp: 98.6 F (37 C) 97.8 F (36.6 C) 98.1 F (36.7 C)   TempSrc:  Oral Oral   SpO2: 94% 99% 94%   Weight:    82.1 kg  Height:          Intake/Output from previous day: I/O last 3 completed shifts: In: 220 [P.O.:220] Out: 851 [Urine:850; Stool:1]   Intake/Output this shift: Total I/O In: 240 [P.O.:240] Out: -    LABORATORY DATA: Recent Labs    08/22/18 0536  08/23/18 0558  08/23/18 2120 08/24/18 0742 08/24/18 1137  WBC 8.8  --  7.8  --   --   --   --   HGB 8.2*  --  8.9*  --   --   --   --   HCT 24.8*  --  27.0*  --   --   --   --   PLT 125*  --  140*  --   --   --   --   NA 138  --  139  --   --   --   --   K 4.3  --  5.1  --   --   --   --   CL 108  --  107  --   --   --   --   CO2 25  --  27  --   --   --   --   BUN 27*  --  22  --   --   --   --   CREATININE 1.17*  --  1.05*  --   --   --   --   GLUCOSE 258*  --  236*  --   --   --   --   GLUCAP  --    < >  --    < > 268* 199* 228*  CALCIUM 8.4*  --  8.8*  --   --   --   --    < > = values in this interval not displayed.    Examination: Neurologically intact Neurovascular intact Sensation intact distally Intact pulses distally Dorsiflexion/Plantar flexion intact Incision: dressing C/D/I No cellulitis present Compartment soft}  Assessment:   4 Days Post-Op Procedure(s) (LRB): INTRAMEDULLARY (IM) NAIL FEMORAL (Left) ADDITIONAL DIAGNOSIS:  Expected Acute Blood Loss Anemia,  Diabetes  and Hypertension, CHF, CAD,CKD  Plan: PT/OT 50 % weight bearing to left lower extremity  DVT Prophylaxis: SCDx72 hrs, ASA 81 mg BID x 2 weeks  DISCHARGE PLAN: Skilled Nursing Facility/Rehab, when bed available and cleared by medicine  DISCHARGE NEEDS: HHPT, Walker and 3-in-1 comode seat

## 2018-08-25 LAB — GLUCOSE, CAPILLARY
GLUCOSE-CAPILLARY: 177 mg/dL — AB (ref 70–99)
GLUCOSE-CAPILLARY: 217 mg/dL — AB (ref 70–99)
GLUCOSE-CAPILLARY: 193 mg/dL — AB (ref 70–99)

## 2018-08-25 MED ORDER — DOCUSATE SODIUM 100 MG PO CAPS: 100.0000 mg | ORAL_CAPSULE | Freq: Two times a day (BID) | ORAL | 0 refills | Status: DC

## 2018-08-25 MED ORDER — ONDANSETRON HCL 4 MG PO TABS: 4.0000 mg | ORAL_TABLET | Freq: Four times a day (QID) | ORAL | 0 refills | Status: DC | PRN

## 2018-08-25 MED ORDER — ASPIRIN EC 81 MG PO TBEC: 81.0000 mg | DELAYED_RELEASE_TABLET | Freq: Two times a day (BID) | ORAL | 2 refills | Status: DC

## 2018-08-25 MED ORDER — POLYETHYLENE GLYCOL 3350 17 G PO PACK: 17.0000 g | PACK | Freq: Every day | ORAL | 0 refills | Status: AC

## 2018-08-25 MED ORDER — ASPIRIN EC 81 MG PO TBEC: 81.0000 mg | DELAYED_RELEASE_TABLET | Freq: Two times a day (BID) | ORAL | 0 refills | Status: AC

## 2018-08-25 MED ORDER — INSULIN GLARGINE 100 UNIT/ML ~~LOC~~ SOLN: 8.0000 [IU] | Freq: Every day | SUBCUTANEOUS | 11 refills | Status: DC

## 2018-08-25 MED ORDER — FUROSEMIDE 20 MG PO TABS: 20.0000 mg | ORAL_TABLET | Freq: Every day | ORAL | 11 refills | Status: DC

## 2018-08-25 MED ORDER — POLYSACCHARIDE IRON COMPLEX 150 MG PO CAPS: 150.0000 mg | ORAL_CAPSULE | Freq: Every day | ORAL | Status: DC

## 2018-08-25 NOTE — Discharge Summary (Signed)
Physician Discharge Summary  [name] YTK:[id number] DOB: 21-Apr-1928 DOA: 08/19/2018  PCP: Mayra Neer, MD  Admit date: 08/19/2018 Discharge date: 08/25/2018  Admitted From:home Disposition: snf Recommendations for Outpatient Follow-up:  1. Follow up with PCP in 1-2 weeks 2. Please obtain BMP/CBC in one week 3. Follow-up with Ortho  Home Health:none Equipment/Devices:none Discharge Condition stable CODE STATUS:full Diet recommendation: cardiac Brief/Interim Summary::84 year old female who presented with left hip pain after a mechanical fall. She does have significant past medical history for hypertension, dyslipidemia, type 2 diabetes mellitus, CVA, systolic heart failure with ejection fraction 30%, complete heart block status post pacemaker, coronary artery disease, pulmonary hypertension and chronic kidney disease stage III. Patient sustained a mechanical fall from her own height, landing on her left hip, developing severe pain and ambulatory dysfunction. Left hip/pelvis radiographs showed soup trochanteric fracture of the left femur with lateral displacement of the main fracture fragment by one shaft width and half shaft width medial displacement of fracture fragment that includes a portion of the lesser trochanter.  Patient was admitted to the hospital with working diagnosis of acute closed left hip fracture, complicated by ambulatory dysfunction.   Discharge Diagnoses:  Principal Problem:   Closed left hip fracture ([place]) Active Problems:   Hypertension   CKD (chronic kidney disease), stage III (HCC)   Coronary artery disease   Type II diabetes mellitus with renal manifestations (HCC)   Complete heart block (HCC)   Chronic combined systolic and diastolic CHF (congestive heart failure) (HCC)   Fall   Macrocytic anemia  1. Left hip fracture sp intramedullary nail-patient is 50% weightbearing to the left lower extremity.  Ortho recommended DVT prophylaxis with aspirin  81 mg twice a day for 2 weeks.  Patient will follow-up with Ortho.   2. Chronic and stable systolic heart failure. Clinically euvolemic, will continue entresto, and carvedilol.  Restart Lasix at lower dose 20 mg daily.  Patient still appears on the dry side.  She was taking Lasix 40 mg at home.   3. Paroxysmal atrial fibrillation. Continue rate control with carvedilol, not on anticoagulation due to risk of falls and bleeding.   4. Stage 3 CKD. Follow on renal function and electrolytes, avoid hypotension and nephrotoxic medications.Stable serum cr at 1,05to 1,17. K at 4,3 and serum bicarbonate at 25.  5. T2DM. Will continue insulin sliding scale for glucose cover and monitoring.Restarted Lantus .  6. HTN. Continue blood pressure control with carvedilol and entresto.   7. Post op acute blood loss anemia combined with iron deficiency. Patient sp one unit prbc transfusion, continue iron supplementation. Hb and hct stable today.  8constipation stool softeners  Discharge Instructions  Discharge Instructions    Call MD for:  difficulty breathing, headache or visual disturbances   Complete by:  As directed    Call MD for:  persistant dizziness or light-headedness   Complete by:  As directed    Call MD for:  persistant nausea and vomiting   Complete by:  As directed    Call MD for:  severe uncontrolled pain   Complete by:  As directed    Diet - low sodium heart healthy   Complete by:  As directed    Increase activity slowly   Complete by:  As directed    Partial weight bearing   Complete by:  As directed      Allergies as of 08/25/2018      Reactions   Fosamax [alendronate] Anaphylaxis   Horse-derived Products Hives  Clonidine Derivatives Other (See Comments)   Unknown reaction   Darvocet [propoxyphene N-acetaminophen] Nausea And Vomiting   Lipitor [atorvastatin] Swelling   Sulfa Drugs Cross Reactors Hives, Itching   Tramadol Nausea And Vomiting   Vicodin  [hydrocodone-acetaminophen] Other (See Comments)   lightheaded   Penicillins Rash   Has patient had a PCN reaction causing immediate rash, facial/tongue/throat swelling, SOB or lightheadedness with hypotension: Yes Has patient had a PCN reaction causing severe rash involving mucus membranes or skin necrosis: No Has patient had a PCN reaction that required hospitalization Yes Has patient had a PCN reaction occurring within the last 10 years: No If all of the above answers are "NO", then may proceed with Cephalosporin   Septra [sulfamethoxazole-trimethoprim] Rash      Medication List    TAKE these medications   acetaminophen 500 MG tablet Commonly known as:  TYLENOL Take 500 mg by mouth daily as needed (pain).   aspirin EC 81 MG tablet Take 1 tablet (81 mg total) by mouth 2 (two) times daily. What changed:  when to take this   carvedilol 12.5 MG tablet Commonly known as:  COREG TAKE 1 TABLET (12.5 MG TOTAL) BY MOUTH 2 (TWO) TIMES DAILY.   docusate sodium 100 MG capsule Commonly known as:  COLACE Take 1 capsule (100 mg total) by mouth 2 (two) times daily.   fish oil-omega-3 fatty acids 1000 MG capsule Take 1 g by mouth 2 (two) times daily.   furosemide 40 MG tablet Commonly known as:  LASIX Take 40 mg by mouth daily as needed for fluid or edema.   glipiZIDE 10 MG tablet Commonly known as:  GLUCOTROL Take 20 mg by mouth daily.   HYDROcodone-acetaminophen 5-325 MG tablet Commonly known as:  NORCO/VICODIN Take 1 tablet by mouth every 6 (six) hours as needed.   insulin glargine 100 UNIT/ML injection Commonly known as:  LANTUS Inject 0.08 mLs (8 Units total) into the skin at bedtime.   iron polysaccharides 150 MG capsule Commonly known as:  NIFEREX Take 1 capsule (150 mg total) by mouth daily. Start taking on:  08/26/2018   lidocaine 5 % Commonly known as:  LIDODERM Place 1 patch onto the skin daily. Remove & Discard patch within 12 hours or as directed by MD    multivitamin with minerals Tabs tablet Take 1 tablet by mouth daily. Centrum Silver   ondansetron 4 MG tablet Commonly known as:  ZOFRAN Take 1 tablet (4 mg total) by mouth every 6 (six) hours as needed for nausea.   oxybutynin 5 MG tablet Commonly known as:  DITROPAN Take 5 mg by mouth 2 (two) times daily.   oxymetazoline 0.05 % nasal spray Commonly known as:  AFRIN Place 1 spray into both nostrils daily as needed for congestion (seasonal allergies).   polyethylene glycol packet Commonly known as:  MIRALAX / GLYCOLAX Take 17 g by mouth daily. Start taking on:  08/26/2018   sacubitril-valsartan 49-51 MG Commonly known as:  ENTRESTO Take 1 tablet by mouth 2 (two) times daily.   ENTRESTO 49-51 MG Generic drug:  sacubitril-valsartan TAKE 1 TABLET BY MOUTH TWICE A DAY   tiZANidine 2 MG tablet Commonly known as:  ZANAFLEX Take 1 tablet (2 mg total) by mouth every 6 (six) hours as needed.   vitamin B-12 1000 MCG tablet Commonly known as:  CYANOCOBALAMIN Take 1,000 mcg by mouth daily.   Vitamin D 2000 units tablet Take 2,000 Units by mouth daily.  Discharge Care Instructions  (From admission, onward)         Start     Ordered   08/20/18 0000  Partial weight bearing     08/20/18 1722         Follow-up Information    Frederik Pear, MD In 2 weeks.   Specialty:  Orthopedic Surgery Contact information: [place] [address] [phone]          Allergies  Allergen Reactions  . Fosamax [Alendronate] Anaphylaxis  . Horse-Derived Products Hives  . Clonidine Derivatives Other (See Comments)    Unknown reaction  . Darvocet [Propoxyphene N-Acetaminophen] Nausea And Vomiting  . Lipitor [Atorvastatin] Swelling  . Sulfa Drugs Cross Reactors Hives and Itching  . Tramadol Nausea And Vomiting  . Vicodin [Hydrocodone-Acetaminophen] Other (See Comments)    lightheaded  . Penicillins Rash    Has patient had a PCN reaction causing  immediate rash, facial/tongue/throat swelling, SOB or lightheadedness with hypotension: Yes Has patient had a PCN reaction causing severe rash involving mucus membranes or skin necrosis: No Has patient had a PCN reaction that required hospitalization Yes Has patient had a PCN reaction occurring within the last 10 years: No If all of the above answers are "NO", then may proceed with Cephalosporin  . Septra [Sulfamethoxazole-Trimethoprim] Rash    Consultations:  ortho   Procedures/Studies: Dg Chest 1 View  Result Date: 08/19/2018 CLINICAL DATA:  Patient fell last evening at 1839 hours and was found on the floor today. Syncopal episode. EXAM: CHEST  1 VIEW COMPARISON:  11/17/2015 FINDINGS: Heart and mediastinal contours are stable and within normal limits with post CABG change and mitral valvular replacement. Mild aortic atherosclerosis without aneurysm. ICD device projects over the left axilla with leads in the right atrium and right ventricle. Lungs are clear without acute pulmonary consolidations. Osteoarthritis of the right glenohumeral joint with high-riding humeral heads bilaterally. Remodeled appearance of the undersurface a the distal right clavicle. Findings likely reflect chronic rotator cuff tear. Differential possibility may include rheumatoid arthritis. IMPRESSION: No active cardiopulmonary disease. Aortic atherosclerosis. Electronically Signed   By: Ashley Royalty M.D.   On: 08/19/2018 18:11   Ct Head Wo Contrast  Result Date: 08/19/2018 CLINICAL DATA:  Patient slipped and fell and was found on the floor. EXAM: CT HEAD WITHOUT CONTRAST CT CERVICAL SPINE WITHOUT CONTRAST TECHNIQUE: Multidetector CT imaging of the head and cervical spine was performed following the standard protocol without intravenous contrast. Multiplanar CT image reconstructions of the cervical spine were also generated. COMPARISON:  11/18/2017 and 11/16/2015 FINDINGS: CT HEAD FINDINGS BRAIN: There is mild to moderate  sulcal and ventricular prominence consistent with superficial and central atrophy. No intraparenchymal hemorrhage, mass effect nor midline shift. Periventricular and subcortical white matter hypodensities consistent with mild-to-moderate chronic small vessel ischemic disease are identified. No acute large vascular territory infarcts. No abnormal extra-axial fluid collections. Basal cisterns are not effaced and midline. Intact brainstem and cerebellum. VASCULAR: Moderate calcific atherosclerosis of the carotid siphons. SKULL: No skull fracture. No significant scalp soft tissue swelling. SINUSES/ORBITS: The mastoid air-cells are clear. The included paranasal sinuses are well-aerated.The included ocular globes and orbital contents are non-suspicious. OTHER: None. CT CERVICAL SPINE FINDINGS Alignment: There is maintained cervical lordosis Skull base and vertebrae: Osteoarthritis of the atlantodental interval with soft tissue pannus posterior to the odontoid process and subcortical erosive change is redemonstrated. Soft tissues and spinal canal: No prevertebral fluid or swelling. No visible canal hematoma. Disc levels: Minimal anterolisthesis grade 1  of C3 on C4. Moderate disc flattening at C5-6 and C6-7. Bilateral uncovertebral joint osteoarthritis is seen at C3-4, C4-5 to a greater extent C5-6 and C6-7. Multilevel degenerative facet arthropathy is seen. Small central disc protrusion C2-3. Central to right central disc bulge at C3-4, mild broad-based disc bulge C4-5, and disc-osteophyte complex at C5-6. Upper chest: Negative. Other: None IMPRESSION: Head CT: 1. Atrophy with chronic small vessel ischemia. No acute intracranial abnormality. 2. Cervical spondylosis without acute cervical spine fracture. Multilevel degenerative disc and facet arthropathy. Electronically Signed   By: Ashley Royalty M.D.   On: 08/19/2018 18:30   Ct Cervical Spine Wo Contrast  Result Date: 08/19/2018 CLINICAL DATA:  Patient slipped and fell  and was found on the floor. EXAM: CT HEAD WITHOUT CONTRAST CT CERVICAL SPINE WITHOUT CONTRAST TECHNIQUE: Multidetector CT imaging of the head and cervical spine was performed following the standard protocol without intravenous contrast. Multiplanar CT image reconstructions of the cervical spine were also generated. COMPARISON:  11/18/2017 and 11/16/2015 FINDINGS: CT HEAD FINDINGS BRAIN: There is mild to moderate sulcal and ventricular prominence consistent with superficial and central atrophy. No intraparenchymal hemorrhage, mass effect nor midline shift. Periventricular and subcortical white matter hypodensities consistent with mild-to-moderate chronic small vessel ischemic disease are identified. No acute large vascular territory infarcts. No abnormal extra-axial fluid collections. Basal cisterns are not effaced and midline. Intact brainstem and cerebellum. VASCULAR: Moderate calcific atherosclerosis of the carotid siphons. SKULL: No skull fracture. No significant scalp soft tissue swelling. SINUSES/ORBITS: The mastoid air-cells are clear. The included paranasal sinuses are well-aerated.The included ocular globes and orbital contents are non-suspicious. OTHER: None. CT CERVICAL SPINE FINDINGS Alignment: There is maintained cervical lordosis Skull base and vertebrae: Osteoarthritis of the atlantodental interval with soft tissue pannus posterior to the odontoid process and subcortical erosive change is redemonstrated. Soft tissues and spinal canal: No prevertebral fluid or swelling. No visible canal hematoma. Disc levels: Minimal anterolisthesis grade 1 of C3 on C4. Moderate disc flattening at C5-6 and C6-7. Bilateral uncovertebral joint osteoarthritis is seen at C3-4, C4-5 to a greater extent C5-6 and C6-7. Multilevel degenerative facet arthropathy is seen. Small central disc protrusion C2-3. Central to right central disc bulge at C3-4, mild broad-based disc bulge C4-5, and disc-osteophyte complex at C5-6. Upper  chest: Negative. Other: None IMPRESSION: Head CT: 1. Atrophy with chronic small vessel ischemia. No acute intracranial abnormality. 2. Cervical spondylosis without acute cervical spine fracture. Multilevel degenerative disc and facet arthropathy. Electronically Signed   By: Ashley Royalty M.D.   On: 08/19/2018 18:30   Dg Ankle Left Port  Result Date: 08/21/2018 CLINICAL DATA:  [place] on 08/20 with laceration and bruising distally EXAM: PORTABLE LEFT ANKLE - 2 VIEW COMPARISON:  None. FINDINGS: No acute fracture is seen. Alignment is normal. The tibiotalar joint space appears normal. IMPRESSION: Negative. Electronically Signed   By: Ivar Drape M.D.   On: 08/21/2018 16:00   Dg C-arm 1-60 Min-no Report  Result Date: 08/20/2018 Fluoroscopy was utilized by the requesting physician.  No radiographic interpretation.   Dg Hip Unilat With Pelvis 2-3 Views Left  Result Date: 08/19/2018 CLINICAL DATA:  Patient fell last evening was found on the floor today. Foreshortened left leg. EXAM: DG HIP (WITH OR WITHOUT PELVIS) 2-3V LEFT COMPARISON:  None. FINDINGS: AP view the pelvis as well as AP and frog-leg views of the left hip were provided. There is a comminuted, closed subtrochanteric fracture of the left femur with lateral displacement of the main fracture fragment  by 1 shaft width and 1/2 shaft width medial displacement of a fracture fragment that includes a portion of the lesser trochanter. A segmental fracture fragment is seen superimposed over the proximal femoral diaphysis measuring 6.1 x 3.4 cm. No acute fracture of the right hip with cemented right total hip arthroplasty. The bony pelvis appears intact. IMPRESSION: There is a comminuted, closed subtrochanteric fracture of the left femur with lateral displacement of the main fracture fragment by 1 shaft width and 1/2 shaft width medial displacement of a fracture fragment that includes a portion of the lesser trochanter. A segmental fracture fragment is seen  superimposed over the proximal femoral diaphysis measuring 6.1 x 3.4 cm Intact right total hip arthroplasty. Electronically Signed   By: Ashley Royalty M.D.   On: 08/19/2018 18:19   Dg Femur 1v Left  Result Date: 08/20/2018 CLINICAL DATA:  Operative fixation of a comminuted left femur subtrochanteric fracture. EXAM: LEFT FEMUR 1 VIEW; DG C-ARM 1-60 MIN-NO REPORT COMPARISON:  Yesterday. FINDINGS: Twelve C-arm views of the left femur demonstrate intramedullary rod and compression screw fixation of the previously demonstrated comminuted subtrochanteric fracture with cerclage wires. Essentially anatomic position and alignment of the fragments on these views. IMPRESSION: Hardware fixation of the previously demonstrated comminuted subtrochanteric left femur fracture. Electronically Signed   By: Claudie Revering M.D.   On: 08/20/2018 17:25    (Echo, Carotid, EGD, Colonoscopy, ERCP)    Subjective:   Discharge Exam: Vitals:   08/25/18 0422 08/25/18 0946  BP: (!) 114/50 (!) 136/44  Pulse: 61 61  Resp: 19   Temp: 98.9 F (37.2 C)   SpO2: 99%    Vitals:   08/24/18 1425 08/24/18 2048 08/25/18 0422 08/25/18 0946  BP: 125/69 (!) 113/43 (!) 114/50 (!) 136/44  Pulse: 75 (!) 56 61 61  Resp: 20 18 19    Temp: 98.6 F (37 C) 98.5 F (36.9 C) 98.9 F (37.2 C)   TempSrc: Oral Oral Oral   SpO2: 100% 97% 99%   Weight:   78 kg   Height:        General: Pt is alert, awake, not in acute distress Cardiovascular: RRR, S1/S2 +, no rubs, no gallops Respiratory: CTA bilaterally, no wheezing, no rhonchi Abdominal: Soft, NT, ND, bowel sounds + Extremities: no edema, no cyanosis    The results of significant diagnostics from this hospitalization (including imaging, microbiology, ancillary and laboratory) are listed below for reference.     Microbiology: Recent Results (from the past 240 hour(s))  Surgical PCR screen     Status: None   Collection Time: 08/20/18  1:22 AM  Result Value Ref Range Status    MRSA, PCR NEGATIVE NEGATIVE Final   Staphylococcus aureus NEGATIVE NEGATIVE Final    Comment: (NOTE) The Xpert SA Assay (FDA approved for NASAL specimens in patients 63 years of age and older), is one component of a comprehensive surveillance program. It is not intended to diagnose infection nor to guide or monitor treatment. Performed at [facility], [place] [address] [place], [place] [address]      Labs: BNP (last 3 results) No results for input(s): BNP in the last 8760 hours. Basic Metabolic Panel: Recent Labs  Lab 08/19/18 1715 08/21/18 0615 08/22/18 0536 08/23/18 0558  NA 141 137 138 139  K 4.4 4.8 4.3 5.1  CL 110 108 108 107  CO2 23 20* 25 27  GLUCOSE 230* 307* 258* 236*  BUN 27* 27* 27* 22  CREATININE 1.33* 1.08* 1.17*  1.05*  CALCIUM 9.4 8.6* 8.4* 8.8*   Liver Function Tests: Recent Labs  Lab 08/19/18 1715  AST 57*  ALT 24  ALKPHOS 63  BILITOT 1.3*  PROT 5.7*  ALBUMIN 3.2*   No results for input(s): LIPASE, AMYLASE in the last 168 hours. No results for input(s): AMMONIA in the last 168 hours. CBC: Recent Labs  Lab 08/19/18 1715 08/20/18 1904 08/21/18 0615 08/22/18 0536 08/23/18 0558  WBC 10.0  --  10.9* 8.8 7.8  NEUTROABS 7.8*  --   --   --   --   HGB 9.4* 8.1* 7.0* 8.2* 8.9*  HCT 29.1* 24.1* 21.2* 24.8* 27.0*  MCV 102.1*  --  101.0* 96.1 97.8  PLT 170  --  128* 125* 140*   Cardiac Enzymes: Recent Labs  Lab 08/19/18 1715 08/21/18 0748  CKTOTAL 1,555* 1,016*   BNP: Invalid input(s): POCBNP CBG: Recent Labs  Lab 08/24/18 1137 08/24/18 1705 08/24/18 2046 08/25/18 0814 08/25/18 1212  GLUCAP 228* 152* 141* 177* 217*   D-Dimer No results for input(s): DDIMER in the last 72 hours. Hgb A1c No results for input(s): HGBA1C in the last 72 hours. Lipid Profile No results for input(s): CHOL, HDL, LDLCALC, TRIG, CHOLHDL, LDLDIRECT in the last 72 hours. Thyroid function studies No results for input(s): TSH, T4TOTAL,  T3FREE, THYROIDAB in the last 72 hours.  Invalid input(s): FREET3 Anemia work up No results for input(s): VITAMINB12, FOLATE, FERRITIN, TIBC, IRON, RETICCTPCT in the last 72 hours. Urinalysis    Component Value Date/Time   COLORURINE YELLOW 01/18/2014 0846   APPEARANCEUR CLEAR 01/18/2014 0846   LABSPEC 1.008 01/18/2014 0846   PHURINE 5.5 01/18/2014 0846   GLUCOSEU NEGATIVE 01/18/2014 0846   HGBUR NEGATIVE 01/18/2014 0846   BILIRUBINUR NEGATIVE 01/18/2014 0846   KETONESUR NEGATIVE 01/18/2014 0846   PROTEINUR NEGATIVE 01/18/2014 0846   UROBILINOGEN 0.2 01/18/2014 0846   NITRITE NEGATIVE 01/18/2014 0846   LEUKOCYTESUR NEGATIVE 01/18/2014 0846   Sepsis Labs Invalid input(s): PROCALCITONIN,  WBC,  LACTICIDVEN Microbiology Recent Results (from the past 240 hour(s))  Surgical PCR screen     Status: None   Collection Time: 08/20/18  1:22 AM  Result Value Ref Range Status   MRSA, PCR NEGATIVE NEGATIVE Final   Staphylococcus aureus NEGATIVE NEGATIVE Final    Comment: (NOTE) The Xpert SA Assay (FDA approved for NASAL specimens in patients 71 years of age and older), is one component of a comprehensive surveillance program. It is not intended to diagnose infection nor to guide or monitor treatment. Performed at [facility], [place] [address] [place], [place] [address]      Time coordinating discharge: 35 minutes  SIGNED:   Georgette Shell, MD  [facility] 08/25/2018, 1:33 PM Pager   If 7PM-7AM, please contact night-coverage [email or web address] Password [email or web address]

## 2018-08-25 NOTE — Clinical Social Work Placement (Signed)
Patient received and accepted bed offer at [facility]. Facility aware of patient's discharge and confirmed bed offer [facility] Medicare authorization received). [facility] contacted, patient's family notified. Patient's RN can call report to [phone] Room 106P, packet complete. CSW signing off, no other needs identified at this time.  CLINICAL SOCIAL WORK PLACEMENT  NOTE  Date:  08/25/2018  Patient Details  Name: [name] MRN: [id number] Date of Birth: 1928/05/24  Clinical Social Work is seeking post-discharge placement for this patient at the [place] level of care (*CSW will initial, date and re-position this form in  chart as items are completed):  Yes   Patient/family provided with [facility] Work Department's list of facilities offering this level of care within the geographic area requested by the patient (or if unable, by the patient's family).  Yes   Patient/family informed of their freedom to choose among providers that offer the needed level of care, that participate in Medicare, Medicaid or managed care program needed by the patient, have an available bed and are willing to accept the patient.  Yes   Patient/family informed of [facility]'s ownership interest in [facility] and [facility], as well as of the fact that they are under no obligation to receive care at these facilities.  PASRR submitted to EDS on       PASRR number received on 08/21/18     Existing PASRR number confirmed on       FL2 transmitted to all facilities in geographic area requested by pt/family on 08/21/18     FL2 transmitted to all facilities within larger geographic area on       Patient informed that his/her managed care company has contracts with or will negotiate with certain facilities, including the following:        Yes   Patient/family informed of bed offers received.  Patient chooses bed at [facility]     Physician recommends and patient  chooses bed at      Patient to be transferred to [facility] on 08/25/18.  Patient to be transferred to facility by PTAR     Patient family notified on 08/25/18 of transfer.  Name of family member notified:  Sharlee Blew     PHYSICIAN       Additional Comment:    _______________________________________________ Burnis Medin, LCSW 08/25/2018, 4:04 PM

## 2018-08-25 NOTE — Care Management Note (Signed)
Case Management Note  Patient Details  Name: [name] MRN: [id number] Date of Birth: 05-20-1928  Subjective/Objective:                  4 Days Post-Op Procedure(s) (LRB): INTRAMEDULLARY (IM) NAIL FEMORAL (Left) Reports waiting on pain meds, pain 5/10, has visitors Ambulate 50 % weight bearing on left leg      . Awaiting SNF placement.  Action/Plan: Following for progression dc plan is for snf placement,  Expected Discharge Date:                  Expected Discharge Plan:  [place]  In-House Referral:  Clinical Social Work  Discharge planning Services  CM Consult  Post Acute Care Choice:    Choice offered to:     DME Arranged:    DME Agency:     HH Arranged:    [place] Agency:     Status of Service:  Completed, signed off  If discussed at [employer], dates discussed:    Additional Comments:  Leeroy Cha, RN 08/25/2018, 11:53 AM

## 2018-08-25 NOTE — Progress Notes (Signed)
Report given to Amy, RN at [facility]. No questions or concerns at this time. Patient is waiting for [facility] to transfer the patient. AVS printed and will be given to [facility] to provide to the facility.

## 2018-08-25 NOTE — Progress Notes (Signed)
Physical Therapy Treatment Patient Details Name: [name] MRN: [id number] DOB: 15-May-1928 Today's Date: 08/25/2018    History of Present Illness 84 year old female with medical history significant of hypertension, diabetes mellitus type 2, chronic systolic CHF, CAD, pulmonary hypertension, complete heart block status post pacemaker placement, and CKD stage III who presented to the emergency department after mechanical fall, found to have left hip fracture and s/p L IM nail 08/20/18    PT Comments    Pt attempted to get to EOB with assist however unable today.  Pt requiring increased assist and reports L LE pain limiting mobility.  Continue to recommend SNF upon d/c.  Follow Up Recommendations  SNF     Equipment Recommendations  None recommended by PT    Recommendations for Other Services       Precautions / Restrictions Precautions Precautions: Fall Restrictions Weight Bearing Restrictions: Yes LLE Weight Bearing: Partial weight bearing LLE Partial Weight Bearing Percentage or Pounds: 50%    Mobility  Bed Mobility Overal bed mobility: Needs Assistance Bed Mobility: Supine to Sit;Sit to Supine;Rolling Rolling: Mod assist   Supine to sit: Total assist Sit to supine: Total assist   General bed mobility comments: multimodal cues for technique, increased assist required; attempted to assist to EOB however pt with increased L LE pain and unable to assist, requesting to have BM and unable to get to [facility] so provided bed pan however pt preferred to remain in sidelying position (placed pillow between legs)  (NT notified)  Transfers                    Ambulation/Gait                 Stairs             Wheelchair Mobility    Modified Rankin (Stroke Patients Only)       Balance                                            Cognition Arousal/Alertness: Awake/alert Behavior During Therapy: WFL for tasks assessed/performed Overall  Cognitive Status: Within Functional Limits for tasks assessed                                        Exercises      General Comments        Pertinent Vitals/Pain Pain Assessment: Faces Faces Pain Scale: Hurts even more Pain Location: L hip with movement Pain Descriptors / Indicators: Grimacing;Operative site guarding Pain Intervention(s): Repositioned;Monitored during session    Home Living                      Prior Function            PT Goals (current goals can now be found in the care plan section) Progress towards PT goals: Progressing toward goals    Frequency    Min 3X/week      PT Plan Current plan remains appropriate    Co-evaluation              AM-PAC PT "6 Clicks" Daily Activity  Outcome Measure  Difficulty turning over in bed (including adjusting bedclothes, sheets and blankets)?: Unable Difficulty moving from lying on back to sitting on the  side of the bed? : Unable Difficulty sitting down on and standing up from a chair with arms (e.g., wheelchair, bedside commode, etc,.)?: Unable Help needed moving to and from a bed to chair (including a wheelchair)?: Total Help needed walking in hospital room?: Total Help needed climbing 3-5 steps with a railing? : Total 6 Click Score: 6    End of Session   Activity Tolerance: Patient limited by fatigue;Patient limited by pain Patient left: in bed;with call bell/phone within reach;with bed alarm set Nurse Communication: Mobility status PT Visit Diagnosis: Other abnormalities of gait and mobility (R26.89)     Time: 2778-2423 PT Time Calculation (min) (ACUTE ONLY): 20 min  Charges:  $Therapeutic Activity: 8-22 mins                     Carmelia Bake, PT, DPT 08/25/2018 Pager: [phone]   York Ram E 08/25/2018, 12:14 PM

## 2018-08-25 NOTE — Progress Notes (Signed)
PATIENT ID: [name]  MRN: [id number]  DOB/AGE:  82-Apr-1929 / 82 y.o.  5 Days Post-Op Procedure(s) (LRB): INTRAMEDULLARY (IM) NAIL FEMORAL (Left)    PROGRESS NOTE Subjective: Patient is alert, oriented, no Nausea, no Vomiting, yes passing gas, . Taking PO well. Denies SOB, Chest or Calf Pain. Using Incentive Spirometer, PAS in place. Ambulate 50%to the left lower leg Patient reports pain as  mild .    Objective: Vital signs in last 24 hours: Vitals:   08/24/18 1425 08/24/18 2048 08/25/18 0422 08/25/18 0946  BP: 125/69 (!) 113/43 (!) 114/50 (!) 136/44  Pulse: 75 (!) 56 61 61  Resp: 20 18 19    Temp: 98.6 F (37 C) 98.5 F (36.9 C) 98.9 F (37.2 C)   TempSrc: Oral Oral Oral   SpO2: 100% 97% 99%   Weight:   78 kg   Height:          Intake/Output from previous day: I/O last 3 completed shifts: In: 580 [P.O.:580] Out: 900 [Urine:900]   Intake/Output this shift: Total I/O In: 360 [P.O.:360] Out: -    LABORATORY DATA: Recent Labs    08/23/18 0558  08/24/18 2046 08/25/18 0814 08/25/18 1212  WBC 7.8  --   --   --   --   HGB 8.9*  --   --   --   --   HCT 27.0*  --   --   --   --   PLT 140*  --   --   --   --   NA 139  --   --   --   --   K 5.1  --   --   --   --   CL 107  --   --   --   --   CO2 27  --   --   --   --   BUN 22  --   --   --   --   CREATININE 1.05*  --   --   --   --   GLUCOSE 236*  --   --   --   --   GLUCAP  --    < > 141* 177* 217*  CALCIUM 8.8*  --   --   --   --    < > = values in this interval not displayed.    Examination: Neurologically intact Neurovascular intact Sensation intact distally Intact pulses distally Dorsiflexion/Plantar flexion intact Incision: no drainage No cellulitis present Compartment soft} XR AP&Lat of hip shows well placed\fixed THA  Assessment:   5 Days Post-Op Procedure(s) (LRB): INTRAMEDULLARY (IM) NAIL FEMORAL (Left) ADDITIONAL DIAGNOSIS:  Expected Acute Blood Loss Anemia, Diabetes and Hypertension, CHF,  CAD,CKD  Plan: PT/OT 50% WB to left lower leg,    DVT Prophylaxis: SCDx72 hrs, ASA 81 mg BID x 2 weeks  DISCHARGE PLAN: Skilled Nursing Facility/Rehab today per social work  DISCHARGE NEEDS: HHPT, Walker and 3-in-1 comode seat

## 2018-08-25 NOTE — Progress Notes (Signed)
CSW following to assist with discharge planning to SNF.   CSW followed up with [name] regarding patient's [facility] insurance authorization. Staff reported that patient's insurance is currently pending.   Patient does not have insurance authorization to discharge to SNF. CSW will continue to follow and assist with discharge planning.  Abundio Miu, [place] Social Worker [facility] Cell#: [phone]

## 2018-08-25 NOTE — Care Management Important Message (Signed)
Important Message  Patient Details  Name: ADAIRA CENTOLA MRN: [id number] Date of Birth: 07-09-1928   Medicare Important Message Given:  Yes    Kerin Salen 08/25/2018, 11:35 AMImportant Message  Patient Details  Name: DIMPLES PROBUS MRN: [id number] Date of Birth: 1928/10/08   Medicare Important Message Given:  Yes    Kerin Salen 08/25/2018, 11:35 AM

## 2018-09-08 ENCOUNTER — Encounter: Payer: Medicare Other | Admitting: *Deleted

## 2018-09-08 ENCOUNTER — Telehealth: Payer: Self-pay

## 2018-09-08 NOTE — Telephone Encounter (Signed)
Attempted to confirm remote transmission with pt. No answer and was unable to leave a message.   

## 2018-09-09 ENCOUNTER — Encounter: Payer: Self-pay | Admitting: Cardiology

## 2018-09-18 ENCOUNTER — Encounter: Payer: Medicare Other | Admitting: Internal Medicine

## 2018-09-22 DIAGNOSIS — Z951 Presence of aortocoronary bypass graft: Secondary | ICD-10-CM | POA: Insufficient documentation

## 2018-09-22 DIAGNOSIS — Z9889 Other specified postprocedural states: Secondary | ICD-10-CM | POA: Insufficient documentation

## 2018-09-22 DIAGNOSIS — Z950 Presence of cardiac pacemaker: Secondary | ICD-10-CM | POA: Insufficient documentation

## 2018-09-22 NOTE — Progress Notes (Deleted)
Cardiology Office Note    Date:  09/22/2018   ID:  [name], DOB 1928-12-23, MRN [id number]  PCP:  Mayra Neer, MD  Cardiologist: Fransico Him, MD EPS: None  No chief complaint on file.   History of Present Illness:  [name] is a 84 y.o. female with history of CADStatus post CABG and mitral valve repair 2006, ischemic cardiomyopathy initially had an ICD but replaced with permanent pacemaker when ejection fraction normalized.  No cath pursued given lack of symptoms.  Subsequent 2D echo 2017 LVEF 20 to 25% with RV dysfunction.  Most recent 2D echo 06/06/2017 LVEF 30 to 35% with grade 1 DD, mild mitral stenosis.  CHB status post permanent pacemaker, PAF not on anticoagulation secondary to age and falls, hypertension, carotid stenosis, dyslipidemia chronic diastolic CHF, hyperlipidemia.  Patient fell and broke her hip status post intramedullary nail 07/2018 given extra Lasix because of blood transfusion but otherwise was stable from a cardiac standpoint.  Past Medical History:  Diagnosis Date  . Arthritis    "left wrist; back" (07/29/2013)  . Cancer [facility]) Sept. 2015   Left Hand  -  Skin Cancer  . Carotid stenosis    occluded right intracranial ICA followed by Dr. Donnetta Hutching.  1-39% left ICA stenosis by dopplers 10/2017  . Chronic combined systolic and diastolic CHF (congestive heart failure) ([place])   . Chronic kidney disease, unspecified   . Complete heart block ([place]) 07/29/2013   a. s/p MDT dual chamber PPM  . Coronary artery disease    s/p CABG  . Hyperlipidemia   . Hypertension   . Ischemic cardiomyopathy     s/p MDT single chamber ICD 2006.  EF improved and device was downgraded to dual chamber PPM 2014.  EF normalized by echo 2014 and at time of battery changeout ICD changed to PPM.  EF then decreased to 20-25% by echo 04/2016.  Seen in AHF clinic and plan was for not pursue cath given her lack of sx and advanced age. Her Delene Loll was increased and it was decided that she  was too high risk for CRTP   . Osteoporosis   . Overactive bladder   . Pacemaker   . Postoperative atrial fibrillation ([place])   . Pulmonary HTN ([place])    severe by echo 04/2016 - likely Group 2 from pulmonary venous HTN secondary to LV dysfunction and MR  . Severe mitral regurgitation    s/p MV ring.  Moderate MR and mild mitral stenosis on echo 04/2016  . Stroke ([place]) 07/2005  . Type II diabetes mellitus ([place])     Past Surgical History:  Procedure Laterality Date  . APPENDECTOMY  1949  . CARDIAC DEFIBRILLATOR PLACEMENT  11/2005   by Dr Leonia Reeves (MDT) she has a [phone] fidelis lead  . CATARACT EXTRACTION W/ INTRAOCULAR LENS  IMPLANT, BILATERAL Bilateral 1980's  . CORONARY ARTERY BYPASS GRAFT  07/2005   "CABG X3" (07/29/2013)  . FEMUR IM NAIL Left 08/20/2018   Procedure: INTRAMEDULLARY (IM) NAIL FEMORAL;  Surgeon: Frederik Pear, MD;  Location: [facility] ORS;  Service: Orthopedics;  Laterality: Left;  . MITRAL VALVE REPLACEMENT  07/2005  . PERMANENT PACEMAKER INSERTION N/A 07/29/2013   upgrade of single chamber ICD to Medtronic Adapta L DR implanted with new RA and RV leads inserted  . SALPINGOOPHORECTOMY Bilateral 1976  . SHOULDER ARTHROSCOPY W/ ROTATOR CUFF REPAIR Right 1998  . TOTAL HIP ARTHROPLASTY Right 2001    Current Medications: No outpatient medications have been marked  as taking for the 09/23/18 encounter (Appointment) with Imogene Burn, PA-C.     Allergies:   Fosamax [alendronate]; Horse-derived products; Clonidine derivatives; Darvocet [propoxyphene n-acetaminophen]; Lipitor [atorvastatin]; Sulfa drugs cross reactors; Tramadol; Vicodin [hydrocodone-acetaminophen]; Penicillins; and Septra [sulfamethoxazole-trimethoprim]   Social History   Socioeconomic History  . Marital status: Unknown    Spouse name: Not on file  . Number of children: Not on file  . Years of education: Not on file  . Highest education level: Not on file  Occupational History  . Occupation: retired Marine scientist    Social Needs  . Financial resource strain: Patient refused  . Food insecurity:    Worry: Patient refused    Inability: Patient refused  . Transportation needs:    Medical: Patient refused    Non-medical: Patient refused  Tobacco Use  . Smoking status: Never Smoker  . Smokeless tobacco: Never Used  Substance and Sexual Activity  . Alcohol use: No    Alcohol/week: 0.0 standard drinks  . Drug use: No  . Sexual activity: Never  Lifestyle  . Physical activity:    Days per week: Patient refused    Minutes per session: Patient refused  . Stress: Patient refused  Relationships  . Social connections:    Talks on phone: Patient refused    Gets together: Patient refused    Attends religious service: Patient refused    Active member of club or organization: Not on file    Attends meetings of clubs or organizations: Patient refused    Relationship status: Patient refused  Other Topics Concern  . Not on file  Social History Narrative  . Not on file     Family History:  The patient's ***family history includes Cancer in her mother and sister; Coronary artery disease in her unknown relative; Diabetes in her daughter, sister, and unknown relative; Heart attack in her sister; Heart disease in her mother and sister; Hyperlipidemia in her sister; Hypertension in her unknown relative.   ROS:   Please see the history of present illness.    ROS All other systems reviewed and are negative.   PHYSICAL EXAM:   VS:  There were no vitals taken for this visit.  Physical Exam  GEN: Well nourished, well developed, in no acute distress  HEENT: normal  Neck: no JVD, carotid bruits, or masses Cardiac:RRR; no murmurs, rubs, or gallops  Respiratory:  clear to auscultation bilaterally, normal work of breathing GI: soft, nontender, nondistended, + BS Ext: without cyanosis, clubbing, or edema, Good distal pulses bilaterally MS: no deformity or atrophy  Skin: warm and dry, no rash Neuro:  Alert and  Oriented x 3, Strength and sensation are intact Psych: euthymic mood, full affect  Wt Readings from Last 3 Encounters:  08/25/18 172 lb (78 kg)  02/20/18 134 lb 3.2 oz (60.9 kg)  11/19/17 134 lb 11.2 oz (61.1 kg)      Studies/Labs Reviewed:   EKG:  EKG is*** ordered today.  The ekg ordered today demonstrates ***  Recent Labs: 08/19/2018: ALT 24 08/23/2018: BUN 22; Creatinine, Ser 1.05; Hemoglobin 8.9; Platelets 140; Potassium 5.1; Sodium 139   Lipid Panel    Component Value Date/Time   CHOL 250 (H) 02/20/2018 1100   TRIG 129 02/20/2018 1100   HDL 51 02/20/2018 1100   CHOLHDL 4.9 (H) 02/20/2018 1100   CHOLHDL 2.9 12/18/2016 0852   VLDL 17 12/18/2016 0852   LDLCALC 173 (H) 02/20/2018 1100    Additional studies/ records that were reviewed today  include:  2D echo 06/06/2017 Study Conclusions   - Left ventricle: The cavity size was normal. Systolic function was   moderately to severely reduced. The estimated ejection fraction   was in the range of 30% to 35%. Images were inadequate for LV   wall motion assessment. Doppler parameters are consistent with   abnormal left ventricular relaxation (grade 1 diastolic   dysfunction). Doppler parameters are consistent with high   ventricular filling pressure. - Mitral valve: Severely calcified annulus. Moderate diffuse   thickening of the anterior leaflet and posterior leaflet, with   moderate involvement of chords. The findings are consistent with   mild stenosis. - Left atrium: The atrium was mildly dilated.   Impressions:   - Recommend definity contrast injection to adequately assess EF and   wall motion.   Carotid Dopplers 10/2017 resistive flow throughout right carotid system suggestive of distal disease, 1 to 51% LICA stenosis.  No change from 2017    ASSESSMENT:    1. DCM (dilated cardiomyopathy) ([place])   2. Coronary artery disease involving native coronary artery of native heart without angina pectoris   3. PAF  (paroxysmal atrial fibrillation) ([facility])   4. Status post mitral valve repair   5. S/P CABG (coronary artery bypass graft)   6. Mixed hyperlipidemia   7. Status post placement of cardiac pacemaker      PLAN:  In order of problems listed above:  Dilated cardiomyopathy most recent echo 06/06/2017 LVEF 30 to 35% but images and adequate for LV wall motion.  Definity was supposed to be used but follow-up echo not done.  Entresto increased.  CAD status post CABG and mitral valve repair 2006.  Asymptomatic.  Mild mitral stenosis on last echo  PAF rate controlled with carvedilol no anticoagulation falls.  Fall with fractured hip 07/2018 and with pending.  Hyperlipidemia  Permanent pacemaker last checked remotely 05/2018 was stable.    Medication Adjustments/Labs and Tests Ordered: Current medicines are reviewed at length with the patient today.  Concerns regarding medicines are outlined above.  Medication changes, Labs and Tests ordered today are listed in the Patient Instructions below. There are no Patient Instructions on file for this visit.   Sumner Boast, PA-C  09/22/2018 3:28 PM    [facility] [address], [place], [place]  [address] Phone: [phone]; Fax: [phone]

## 2018-09-23 ENCOUNTER — Ambulatory Visit: Payer: Medicare Other | Admitting: Physician Assistant

## 2018-09-25 ENCOUNTER — Encounter: Payer: Self-pay | Admitting: Physician Assistant

## 2018-10-11 ENCOUNTER — Emergency Department (HOSPITAL_COMMUNITY): Payer: Medicare Other

## 2018-10-11 ENCOUNTER — Encounter (HOSPITAL_COMMUNITY): Payer: Self-pay | Admitting: Emergency Medicine

## 2018-10-11 ENCOUNTER — Emergency Department (HOSPITAL_COMMUNITY)
Admission: EM | Admit: 2018-10-11 | Discharge: 2018-10-11 | Disposition: A | Discharge status: Home / Self Care | Payer: Medicare Other | Attending: Emergency Medicine | Admitting: Emergency Medicine

## 2018-10-11 DIAGNOSIS — M25552 Pain in left hip: Secondary | ICD-10-CM | POA: Insufficient documentation

## 2018-10-11 DIAGNOSIS — I5042 Chronic combined systolic (congestive) and diastolic (congestive) heart failure: Secondary | ICD-10-CM | POA: Diagnosis not present

## 2018-10-11 DIAGNOSIS — Y92122 Bedroom in nursing home as the place of occurrence of the external cause: Secondary | ICD-10-CM | POA: Diagnosis not present

## 2018-10-11 DIAGNOSIS — E1122 Type 2 diabetes mellitus with diabetic chronic kidney disease: Secondary | ICD-10-CM | POA: Diagnosis not present

## 2018-10-11 DIAGNOSIS — I129 Hypertensive chronic kidney disease with stage 1 through stage 4 chronic kidney disease, or unspecified chronic kidney disease: Secondary | ICD-10-CM | POA: Insufficient documentation

## 2018-10-11 DIAGNOSIS — S0083XA Contusion of other part of head, initial encounter: Secondary | ICD-10-CM

## 2018-10-11 DIAGNOSIS — W010XXA Fall on same level from slipping, tripping and stumbling without subsequent striking against object, initial encounter: Secondary | ICD-10-CM | POA: Diagnosis not present

## 2018-10-11 DIAGNOSIS — M25511 Pain in right shoulder: Secondary | ICD-10-CM | POA: Diagnosis not present

## 2018-10-11 DIAGNOSIS — S0990XA Unspecified injury of head, initial encounter: Secondary | ICD-10-CM | POA: Diagnosis present

## 2018-10-11 DIAGNOSIS — N183 Chronic kidney disease, stage 3 (moderate): Secondary | ICD-10-CM | POA: Insufficient documentation

## 2018-10-11 DIAGNOSIS — Y998 Other external cause status: Secondary | ICD-10-CM | POA: Insufficient documentation

## 2018-10-11 DIAGNOSIS — M25512 Pain in left shoulder: Secondary | ICD-10-CM | POA: Insufficient documentation

## 2018-10-11 DIAGNOSIS — Z794 Long term (current) use of insulin: Secondary | ICD-10-CM | POA: Diagnosis not present

## 2018-10-11 DIAGNOSIS — W19XXXA Unspecified fall, initial encounter: Secondary | ICD-10-CM

## 2018-10-11 DIAGNOSIS — Y93E9 Activity, other interior property and clothing maintenance: Secondary | ICD-10-CM | POA: Insufficient documentation

## 2018-10-11 LAB — BASIC METABOLIC PANEL
ANION GAP: 11 (ref 5–15)
BUN: 32 mg/dL — ABNORMAL HIGH (ref 8–23)
CALCIUM: 9.7 mg/dL (ref 8.9–10.3)
CHLORIDE: 102 mmol/L (ref 98–111)
CO2: 23 mmol/L (ref 22–32)
CREATININE: 1.31 mg/dL — AB (ref 0.44–1.00)
GFR calc non Af Amer: 35 mL/min — ABNORMAL LOW (ref >60)
GFR, EST AFRICAN AMERICAN: 41 mL/min — AB (ref >60)
Glucose, Bld: 134 mg/dL — ABNORMAL HIGH (ref 70–99)
Potassium: 4.3 mmol/L (ref 3.5–5.1)
Sodium: 136 mmol/L (ref 135–145)

## 2018-10-11 LAB — CBC WITH DIFFERENTIAL/PLATELET
Abs Immature Granulocytes: 0.03 10*3/uL (ref 0.00–0.07)
BASOS ABS: 0 10*3/uL (ref 0.0–0.1)
Basophils Relative: 1 %
EOS ABS: 0.2 10*3/uL (ref 0.0–0.5)
EOS PCT: 3 %
HCT: 36.3 % (ref 36.0–46.0)
Hemoglobin: 11 g/dL — ABNORMAL LOW (ref 12.0–15.0)
Immature Granulocytes: 0 %
LYMPHS PCT: 24 %
Lymphs Abs: 1.9 10*3/uL (ref 0.7–4.0)
MCH: 30.1 pg (ref 26.0–34.0)
MCHC: 30.3 g/dL (ref 30.0–36.0)
MCV: 99.2 fL (ref 80.0–100.0)
Monocytes Absolute: 0.8 10*3/uL (ref 0.1–1.0)
Monocytes Relative: 10 %
NEUTROS PCT: 62 %
NRBC: 0 % (ref 0.0–0.2)
Neutro Abs: 4.8 10*3/uL (ref 1.7–7.7)
Platelets: 222 10*3/uL (ref 150–400)
RBC: 3.66 MIL/uL — AB (ref 3.87–5.11)
RDW: 14.7 % (ref 11.5–15.5)
WBC: 7.7 10*3/uL (ref 4.0–10.5)

## 2018-10-11 NOTE — Discharge Instructions (Addendum)
You were seen in the emergency department for injuries from a fall.  You had a CAT scan of your head and cervical spine along with x-rays of your shoulders and upper arms in your left hip.  No obvious fractures were identified.  Recommend Tylenol for pain and follow-up with your doctor.  Return if any concerns.

## 2018-10-11 NOTE — ED Provider Notes (Signed)
[facility] EMERGENCY DEPARTMENT Provider Note   CSN: [id number] Arrival date & time: 10/11/18  1739     History   Chief Complaint Chief Complaint  Patient presents with  . Fall    HPI [name] is a 84 y.o. female.  She presents from [facility] by EMS after a fall.  She said she was helping somebody clean her room and she got tangled up with them and fell to the floor.  She denies loss of consciousness.  Per staff she was found lying by her bed she is complaining of head pain bilateral shoulder arm pain and left hip pain.  She said she recently had an injury to her left hip that required surgery about 3 weeks ago.  No nausea no vomiting no chest pain no shortness of breath no numbness or weakness. Level 5 caveat secondary to dementia.  The history is provided by the patient and the EMS personnel.  Fall  This is a new problem. The current episode started 1 to 2 hours ago. Pertinent negatives include no chest pain, no abdominal pain, no headaches and no shortness of breath. The symptoms are aggravated by bending and twisting. The symptoms are relieved by rest. She has tried rest for the symptoms. The treatment provided mild relief.    Past Medical History:  Diagnosis Date  . Arthritis    "left wrist; back" (07/29/2013)  . Cancer [facility] Sept. 2015   Left Hand  -  Skin Cancer  . Carotid stenosis    occluded right intracranial ICA followed by Dr. Donnetta Hutching.  1-39% left ICA stenosis by dopplers 10/2017  . Chronic combined systolic and diastolic CHF (congestive heart failure) ([place])   . Chronic kidney disease, unspecified   . Complete heart block ([place]) 07/29/2013   a. s/p MDT dual chamber PPM  . Coronary artery disease    s/p CABG  . Hyperlipidemia   . Hypertension   . Ischemic cardiomyopathy     s/p MDT single chamber ICD 2006.  EF improved and device was downgraded to dual chamber PPM 2014.  EF normalized by echo 2014 and at time of battery changeout ICD  changed to PPM.  EF then decreased to 20-25% by echo 04/2016.  Seen in AHF clinic and plan was for not pursue cath given her lack of sx and advanced age. Her Delene Loll was increased and it was decided that she was too high risk for CRTP   . Osteoporosis   . Overactive bladder   . Pacemaker   . Postoperative atrial fibrillation ([place])   . Pulmonary HTN ([place])    severe by echo 04/2016 - likely Group 2 from pulmonary venous HTN secondary to LV dysfunction and MR  . Severe mitral regurgitation    s/p MV ring.  Moderate MR and mild mitral stenosis on echo 04/2016  . Stroke ([place]) 07/2005  . Type II diabetes mellitus [facility])     Patient Active Problem List   Diagnosis Date Noted  . Status post mitral valve repair 09/22/2018  . S/P CABG (coronary artery bypass graft) 09/22/2018  . Status post placement of cardiac pacemaker 09/22/2018  . Closed left hip fracture ([place]) 08/19/2018  . Macrocytic anemia 08/19/2018  . DCM (dilated cardiomyopathy) ([place]) 02/20/2018  . Heart murmur 05/14/2016  . Fall 11/17/2015  . Scalp laceration 11/17/2015  . Facial contusion 11/17/2015  . Rib fracture 11/16/2015  . Paresthesia of both hands 01/18/2014  . Renal insufficiency 01/18/2014  . Lower  extremity weakness 01/18/2014  . Coronary artery disease   . Type II diabetes mellitus with renal manifestations ([place])   . Hyperlipidemia   . Carotid stenosis   . Complete heart block ([place])   . Severe mitral regurgitation   . Chronic combined systolic and diastolic CHF (congestive heart failure) ([place])   . PAF (paroxysmal atrial fibrillation) ([place]) 10/29/2013  . 9379 lead 07/21/2013  . CKD (chronic kidney disease), stage III ([place])   . Hypertension 04/30/2011    Past Surgical History:  Procedure Laterality Date  . APPENDECTOMY  1949  . CARDIAC DEFIBRILLATOR PLACEMENT  11/2005   by Dr Leonia Reeves (MDT) she has a [phone] fidelis lead  . CATARACT EXTRACTION W/ INTRAOCULAR LENS  IMPLANT, BILATERAL Bilateral 1980's  . CORONARY  ARTERY BYPASS GRAFT  07/2005   "CABG X3" (07/29/2013)  . FEMUR IM NAIL Left 08/20/2018   Procedure: INTRAMEDULLARY (IM) NAIL FEMORAL;  Surgeon: Frederik Pear, MD;  Location: [facility] ORS;  Service: Orthopedics;  Laterality: Left;  . MITRAL VALVE REPLACEMENT  07/2005  . PERMANENT PACEMAKER INSERTION N/A 07/29/2013   upgrade of single chamber ICD to Med[employer]onic Adapta L DR implanted with new RA and RV leads inserted  . SALPINGOOPHORECTOMY Bilateral 1976  . SHOULDER ARTHROSCOPY W/ ROTATOR CUFF REPAIR Right 1998  . TOTAL HIP ARTHROPLASTY Right 2001     OB History   None      Home Medications    Prior to Admission medications   Medication Sig Start Date End Date Taking? Authorizing Provider  acetaminophen (TYLENOL) 500 MG tablet Take 1,000 mg by mouth 3 (three) times daily.    Yes [provider]  aspirin EC 81 MG tablet Take 81 mg by mouth 2 (two) times daily.   Yes [provider]  carvedilol (COREG) 12.5 MG tablet TAKE 1 TABLET (12.5 MG TOTAL) BY MOUTH 2 (TWO) TIMES DAILY. 06/03/18  Yes Turner, Eber Hong, MD  Cholecalciferol (VITAMIN D) 2000 UNITS tablet Take 2,000 Units by mouth daily.    Yes [provider]  fish oil-omega-3 fatty acids 1000 MG capsule Take 1 g by mouth 2 (two) times daily.    Yes [provider]  furosemide (LASIX) 40 MG tablet Take 40 mg by mouth daily as needed for fluid or edema.   Yes [provider]  glipiZIDE (GLUCOTROL) 10 MG tablet Take 20 mg by mouth daily. 06/13/18  Yes [provider]  insulin glargine (LANTUS) 100 UNIT/ML injection Inject 0.08 mLs (8 Units total) into the skin at bedtime. 08/25/18  Yes Georgette Shell, MD  iron polysaccharides (NIFEREX) 150 MG capsule Take 1 capsule (150 mg total) by mouth daily. 08/26/18  Yes Georgette Shell, MD  Lidocaine (ASPERCREME LIDOCAINE) 4 % PTCH Place onto the skin See admin instructions. Apply 4% patch transdermally to back, left hip and thigh daily in the morning/ Do  not cover incision.   Yes [provider]  Multiple Vitamin (MULTIVITAMIN WITH MINERALS) TABS tablet Take 1 tablet by mouth daily. Centrum Silver   Yes [provider]  nitroGLYCERIN (NITROSTAT) 0.4 MG SL tablet Place 0.4 mg under the tongue every 5 (five) minutes as needed for chest pain (max 3 doses/ recheck vital signs 30 minutes after admnistering).   Yes [provider]  ondansetron (ZOFRAN-ODT) 4 MG disintegrating tablet Take 4 mg by mouth every 6 (six) hours as needed for nausea or vomiting.   Yes [provider]  oxybutynin (DITROPAN) 5 MG tablet Take 2.5 mg by  mouth 2 (two) times daily.  05/02/15  Yes [provider]  oxymetazoline (AFRIN) 0.05 % nasal spray Place 1 spray into both nostrils daily as needed for congestion (seasonal allergies).   Yes [provider]  polyethylene glycol (MIRALAX / GLYCOLAX) packet Take 17 g by mouth daily. Patient taking differently: Take 17 g by mouth every evening.  08/26/18  Yes Georgette Shell, MD  sacubitril-valsartan (ENTRESTO) 49-51 MG Take 1 tablet by mouth 2 (two) times daily. 07/07/18  Yes Larey Dresser, MD  senna-docusate (SENNA-S) 8.6-50 MG tablet Take 1 tablet by mouth 2 (two) times daily.   Yes [provider]  vitamin B-12 (CYANOCOBALAMIN) 1000 MCG tablet Take 1,000 mcg by mouth daily.   Yes [provider]  docusate sodium (COLACE) 100 MG capsule Take 1 capsule (100 mg total) by mouth 2 (two) times daily. Patient not taking: Reported on 10/11/2018 08/25/18   Georgette Shell, MD  doxycycline (VIBRA-TABS) 100 MG tablet Take 100 mg by mouth 2 (two) times daily.    [provider]  ENTRESTO 49-51 MG TAKE 1 TABLET BY MOUTH TWICE A DAY Patient not taking: Reported on 08/19/2018 08/18/18   Larey Dresser, MD  furosemide (LASIX) 20 MG tablet Take 1 tablet (20 mg total) by mouth daily. Patient not taking: Reported on 10/11/2018 08/25/18 08/25/19  Georgette Shell,  MD  HYDROcodone-acetaminophen [facility]) 5-325 MG tablet Take 1 tablet by mouth every 6 (six) hours as needed. Patient not taking: Reported on 10/11/2018 08/20/18   Leighton Parody, PA-C  lidocaine (LIDODERM) 5 % Place 1 patch onto the skin daily. Remove & Discard patch within 12 hours or as directed by MD Patient not taking: Reported on 10/11/2018 08/21/17   Tegeler, Gwenyth Allegra, MD  ondansetron (ZOFRAN) 4 MG tablet Take 1 tablet (4 mg total) by mouth every 6 (six) hours as needed for nausea. Patient not taking: Reported on 10/11/2018 08/25/18   Georgette Shell, MD  tiZANidine (ZANAFLEX) 2 MG tablet Take 1 tablet (2 mg total) by mouth every 6 (six) hours as needed. Patient not taking: Reported on 10/11/2018 08/20/18   Leighton Parody, PA-C    Family History Family History  Problem Relation Age of Onset  . Cancer Mother        Theadora Rama   . Heart disease Mother        Before age 9  . Heart disease Sister        Before age 66-  CABG  . Cancer Sister        Lonia Chimera  . Diabetes Sister   . Hyperlipidemia Sister   . Heart attack Sister   . Diabetes Daughter   . Coronary artery disease Unknown        family hx of  . Hypertension Unknown        family hx of  . Diabetes Unknown        family hx of    Social History Social History   Tobacco Use  . Smoking status: Never Smoker  . Smokeless tobacco: Never Used  Substance Use Topics  . Alcohol use: No    Alcohol/week: 0.0 standard drinks  . Drug use: No     Allergies   Fosamax [alendronate]; Horse-derived products; Clonidine derivatives; Darvocet [propoxyphene n-acetaminophen]; Lipitor [atorvastatin]; Sulfa drugs cross reactors; Tramadol; Vicodin [hydrocodone-acetaminophen]; Penicillins; and Septra [sulfamethoxazole-trimethoprim]   Review of Systems Review of Systems  Constitutional: Negative for fever.  HENT: Negative for sore throat.  Eyes: Negative for visual disturbance.  Respiratory: Negative for shortness of  breath.   Cardiovascular: Negative for chest pain.  Gastrointestinal: Negative for abdominal pain.  Genitourinary: Negative for dysuria.  Musculoskeletal: Negative for back pain and neck pain.  Skin: Negative for rash.  Neurological: Negative for headaches.     Physical Exam Updated Vital Signs BP 137/62 (BP Location: Left Arm)   Pulse 70   Temp 98.2 F (36.8 C) (Oral)   Resp 18   SpO2 99%   Physical Exam  Constitutional: She appears well-developed and well-nourished.  HENT:  Head: Normocephalic and atraumatic.  Eyes: Conjunctivae are normal.  Neck: Neck supple.  Cardiovascular: Normal rate, regular rhythm and intact distal pulses.  Pulmonary/Chest: Effort normal. No stridor. She has no wheezes.  Abdominal: Soft. She exhibits no mass. There is no tenderness. There is no guarding.  She is erythema and tenderness throughout her sacrum and intergluteal area.  Looks like she had a DuoDERM placed over that area that account of wadded up.  Musculoskeletal:  She has some tenderness in her proximal right humerus and anterior right shoulder.  Nontender elbow wrist hand.  Similar tenderness left shoulder anterior and proximal humerus.  Nontender elbow and wrist.  Full range of motion of right lower extremity without limitations.  She is got normal internal/external rotation and flexion extension of her left hip but does cause her some discomfort.  No spine tenderness.  No chest wall tenderness.  Neurological: She is alert. GCS eye subscore is 4. GCS verbal subscore is 5. GCS motor subscore is 6.  Skin: Skin is warm and dry.  Psychiatric: She has a normal mood and affect.  Nursing note and vitals reviewed.    ED Treatments / Results  Labs (all labs ordered are listed, but only abnormal results are displayed) Labs Reviewed  BASIC METABOLIC PANEL - Abnormal; Notable for the following components:      Result Value   Glucose, Bld 134 (*)    BUN 32 (*)    Creatinine, Ser 1.31 (*)     GFR calc non Af Amer 35 (*)    GFR calc Af Amer 41 (*)    All other components within normal limits  CBC WITH DIFFERENTIAL/PLATELET - Abnormal; Notable for the following components:   RBC 3.66 (*)    Hemoglobin 11.0 (*)    All other components within normal limits    EKG None  Radiology Dg Chest 1 View  Result Date: 10/11/2018 CLINICAL DATA:  Unwitnessed fall out of bed today. EXAM: CHEST  1 VIEW COMPARISON:  08/19/2018 FINDINGS: Heart size is normal. AICD remains in appropriate position. Prior CABG and mitral valve replacement. Aortic atherosclerosis. Both lungs are clear. No evidence of pneumothorax or pleural effusion. IMPRESSION: No active disease. Electronically Signed   By: Earle Gell M.D.   On: 10/11/2018 20:12   Dg Shoulder Right  Result Date: 10/11/2018 CLINICAL DATA:  Unwitnessed fall out of bed today. Right shoulder pain. Initial encounter. EXAM: RIGHT SHOULDER - 2+ VIEW COMPARISON:  None. FINDINGS: There is no evidence of acute fracture or dislocation. Generalized osteopenia noted. Severe glenohumeral joint osteoarthritis is seen. Chronic bony remodeling of the distal clavicle and acromion process is consistent with chronic rotator cuff tear or atrophy. IMPRESSION: No acute findings. Electronically Signed   By: Earle Gell M.D.   On: 10/11/2018 20:14   Ct Head Wo Contrast  Result Date: 10/11/2018 CLINICAL DATA:  Patient status post fall. EXAM: CT HEAD WITHOUT CONTRAST  CT CERVICAL SPINE WITHOUT CONTRAST TECHNIQUE: Multidetector CT imaging of the head and cervical spine was performed following the standard protocol without intravenous contrast. Multiplanar CT image reconstructions of the cervical spine were also generated. COMPARISON:  Brain and cervical spine CT 08/19/2018 FINDINGS: CT HEAD FINDINGS Brain: Ventricles and sulci are prominent compatible with atrophy. Periventricular and subcortical white matter hypodensity compatible with chronic microvascular ischemic changes. No  evidence for acute cortically based infarct, intracranial hemorrhage, mass lesion or mass-effect. Vascular: Unremarkable. Skull: Intact. Sinuses/Orbits: Paranasal sinuses are well aerated. Mastoid air cells are unremarkable. Orbits are unremarkable. Other: None. CT CERVICAL SPINE FINDINGS Alignment: Normal anatomic alignment. Skull base and vertebrae: Intact. Soft tissues and spinal canal: No prevertebral fluid or swelling. No visible canal hematoma. Disc levels: Multilevel degenerative disc disease most pronounced C5-6 and C6-7. Grade 1 anterolisthesis of C2 on C3 likely secondary to facet degenerative changes. Upper chest: Unremarkable. Other: None. IMPRESSION: 1. No acute intracranial process. Atrophy and chronic microvascular ischemic changes. 2. No acute cervical spine fracture.  Degenerative changes. Electronically Signed   By: Lovey Newcomer M.D.   On: 10/11/2018 20:21   Ct Cervical Spine Wo Contrast  Result Date: 10/11/2018 CLINICAL DATA:  Patient status post fall. EXAM: CT HEAD WITHOUT CONTRAST CT CERVICAL SPINE WITHOUT CONTRAST TECHNIQUE: Multidetector CT imaging of the head and cervical spine was performed following the standard protocol without intravenous contrast. Multiplanar CT image reconstructions of the cervical spine were also generated. COMPARISON:  Brain and cervical spine CT 08/19/2018 FINDINGS: CT HEAD FINDINGS Brain: Ventricles and sulci are prominent compatible with atrophy. Periventricular and subcortical white matter hypodensity compatible with chronic microvascular ischemic changes. No evidence for acute cortically based infarct, intracranial hemorrhage, mass lesion or mass-effect. Vascular: Unremarkable. Skull: Intact. Sinuses/Orbits: Paranasal sinuses are well aerated. Mastoid air cells are unremarkable. Orbits are unremarkable. Other: None. CT CERVICAL SPINE FINDINGS Alignment: Normal anatomic alignment. Skull base and vertebrae: Intact. Soft tissues and spinal canal: No prevertebral  fluid or swelling. No visible canal hematoma. Disc levels: Multilevel degenerative disc disease most pronounced C5-6 and C6-7. Grade 1 anterolisthesis of C2 on C3 likely secondary to facet degenerative changes. Upper chest: Unremarkable. Other: None. IMPRESSION: 1. No acute intracranial process. Atrophy and chronic microvascular ischemic changes. 2. No acute cervical spine fracture.  Degenerative changes. Electronically Signed   By: Lovey Newcomer M.D.   On: 10/11/2018 20:21   Dg Shoulder Left  Result Date: 10/11/2018 CLINICAL DATA:  Fall out of bed today. Left shoulder injury and pain. Initial encounter. EXAM: LEFT SHOULDER - 2+ VIEW COMPARISON:  None. FINDINGS: There is no evidence of fracture or dislocation. There is no evidence of arthropathy or other focal bone abnormality. Generalized osteopenia noted. Soft tissues are unremarkable. IMPRESSION: No acute findings.  Osteopenia. Electronically Signed   By: Earle Gell M.D.   On: 10/11/2018 20:15   Dg Humerus Left  Result Date: 10/11/2018 CLINICAL DATA:  Fall out of bed today. Left arm pain. Initial encounter. EXAM: LEFT HUMERUS - 2+ VIEW COMPARISON:  None. FINDINGS: There is no evidence of fracture or other focal bone lesions. Soft tissues are unremarkable. IMPRESSION: Negative. Electronically Signed   By: Earle Gell M.D.   On: 10/11/2018 20:14   Dg Humerus Right  Result Date: 10/11/2018 CLINICAL DATA:  Unwitnessed fall out of bed today. Right arm pain. Initial encounter. EXAM: RIGHT HUMERUS - 2+ VIEW COMPARISON:  None. FINDINGS: There is no evidence of fracture or other focal bone lesions. Generalized osteopenia is noted.  Severe glenohumeral osteoarthritis is seen with remodeling of the distal clavicle and acromion, consistent with chronic rotator cuff tear or atrophy. IMPRESSION: No acute findings. Electronically Signed   By: Earle Gell M.D.   On: 10/11/2018 20:13   Dg Hip Unilat With Pelvis 2-3 Views Left  Result Date: 10/11/2018 CLINICAL  DATA:  Fall out of bed today. Left hip pain. Initial encounter. EXAM: DG HIP (WITH OR WITHOUT PELVIS) 2-3V LEFT COMPARISON:  08/19/2018 FINDINGS: Sliding screw and intramedullary rod are again seen within the left hip with cerclage wires in the proximal femoral shaft. Comminuted fracture of the proximal femur is again seen in anatomic alignment. No evidence of acute fracture or dislocation. IMPRESSION: No acute findings. Internal fixation of proximal femur fracture which remains in near anatomic alignment. Electronically Signed   By: Earle Gell M.D.   On: 10/11/2018 20:11    Procedures Procedures (including critical care time)  Medications Ordered in ED Medications - No data to display   Initial Impression / Assessment and Plan / ED Course  I have reviewed the triage vital signs and the nursing notes.  Pertinent labs & imaging results that were available during my care of the patient were reviewed by me and considered in my medical decision making (see chart for details).    No fractures identified. Will return to nursing facility.   Final Clinical Impressions(s) / ED Diagnoses   Final diagnoses:  Fall, initial encounter  Contusion of face, initial encounter  Acute pain of both shoulders  Acute hip pain, left    ED Discharge Orders    None       Hayden Rasmussen, MD 10/12/18 1003

## 2018-10-11 NOTE — ED Notes (Signed)
Notified [facility] for transport back home 

## 2018-10-11 NOTE — ED Triage Notes (Signed)
Pt arrives via EMS from [facility] with a unwitnessed fall. Staff found pt lying by bed, denies LOC. Cognitive at baseline. Reports recent hip surgery. Right Shoulder pain. EMS has pelvic binder in place. No blood thinners.

## 2018-10-22 NOTE — Progress Notes (Signed)
Electrophysiology Office Note Date: 10/23/2018  ID:  [name], DOB 04-24-1928, MRN [id number]  PCP: Mayra Neer, MD Primary Cardiologist: Radford Pax Electrophysiologist: Allred  CC: Pacemaker follow-up  [name] is a 84 y.o. female seen today for Dr Rayann Heman.  She presents today for routine electrophysiology followup.  Since last being seen in our clinic, the patient reports doing relatively well.  She fell recently and has been at rehab recovering. She is hoping to get back home soon.  She denies chest pain, palpitations, dyspnea, PND, orthopnea, nausea, vomiting, dizziness, syncope, edema, weight gain, or early satiety.  Device History: MDT single chamber ICD implanted 2006 for NICM; downgrade to dual chamber MDT PPM implanted 2014 for transient complete heart block   Past Medical History:  Diagnosis Date  . Arthritis    "left wrist; back" (07/29/2013)  . Cancer [facility]) Sept. 2015   Left Hand  -  Skin Cancer  . Carotid stenosis    occluded right intracranial ICA followed by Dr. Donnetta Hutching.  1-39% left ICA stenosis by dopplers 10/2017  . Chronic combined systolic and diastolic CHF (congestive heart failure) ([place])   . Chronic kidney disease, unspecified   . Complete heart block ([place]) 07/29/2013   a. s/p MDT dual chamber PPM  . Coronary artery disease    s/p CABG  . Hyperlipidemia   . Hypertension   . Ischemic cardiomyopathy     s/p MDT single chamber ICD 2006.  EF improved and device was downgraded to dual chamber PPM 2014.  EF normalized by echo 2014 and at time of battery changeout ICD changed to PPM.  EF then decreased to 20-25% by echo 04/2016.  Seen in AHF clinic and plan was for not pursue cath given her lack of sx and advanced age. Her Delene Loll was increased and it was decided that she was too high risk for CRTP   . Osteoporosis   . Overactive bladder   . Pacemaker   . Postoperative atrial fibrillation ([place])   . Pulmonary HTN ([place])    severe by echo 04/2016 - likely  Group 2 from pulmonary venous HTN secondary to LV dysfunction and MR  . Severe mitral regurgitation    s/p MV ring.  Moderate MR and mild mitral stenosis on echo 04/2016  . Stroke ([place]) 07/2005  . Type II diabetes mellitus ([place])    Past Surgical History:  Procedure Laterality Date  . APPENDECTOMY  1949  . CARDIAC DEFIBRILLATOR PLACEMENT  11/2005   by Dr Leonia Reeves (MDT) she has a [phone] fidelis lead  . CATARACT EXTRACTION W/ INTRAOCULAR LENS  IMPLANT, BILATERAL Bilateral 1980's  . CORONARY ARTERY BYPASS GRAFT  07/2005   "CABG X3" (07/29/2013)  . FEMUR IM NAIL Left 08/20/2018   Procedure: INTRAMEDULLARY (IM) NAIL FEMORAL;  Surgeon: Frederik Pear, MD;  Location: [facility];  Service: Orthopedics;  Laterality: Left;  . MITRAL VALVE REPLACEMENT  07/2005  . PERMANENT PACEMAKER INSERTION N/A 07/29/2013   upgrade of single chamber ICD to Medtronic Adapta L DR implanted with new RA and RV leads inserted  . SALPINGOOPHORECTOMY Bilateral 1976  . SHOULDER ARTHROSCOPY W/ ROTATOR CUFF REPAIR Right 1998  . TOTAL HIP ARTHROPLASTY Right 2001    Current Outpatient Medications  Medication Sig Dispense Refill  . acetaminophen (TYLENOL) 500 MG tablet Take 1,000 mg by mouth 3 (three) times daily.     Marland Kitchen aspirin EC 81 MG tablet Take 81 mg by mouth 2 (two) times daily.    . carvedilol (  COREG) 12.5 MG tablet TAKE 1 TABLET (12.5 MG TOTAL) BY MOUTH 2 (TWO) TIMES DAILY. 60 tablet 7  . Cholecalciferol (VITAMIN D) 2000 UNITS tablet Take 2,000 Units by mouth daily.     . fish oil-omega-3 fatty acids 1000 MG capsule Take 1 g by mouth 2 (two) times daily.     Marland Kitchen glipiZIDE (GLUCOTROL) 10 MG tablet Take 20 mg by mouth daily.  2  . insulin glargine (LANTUS) 100 UNIT/ML injection Inject 0.08 mLs (8 Units total) into the skin at bedtime. 10 mL 11  . iron polysaccharides (NIFEREX) 150 MG capsule Take 1 capsule (150 mg total) by mouth daily.    . Multiple Vitamin (MULTIVITAMIN WITH MINERALS) TABS tablet Take 1 tablet by mouth daily.  Centrum Silver    . nitroGLYCERIN (NITROSTAT) 0.4 MG SL tablet Place 0.4 mg under the tongue every 5 (five) minutes as needed for chest pain (max 3 doses/ recheck vital signs 30 minutes after admnistering).    . ondansetron (ZOFRAN) 4 MG tablet Take 1 tablet (4 mg total) by mouth every 6 (six) hours as needed for nausea. 20 tablet 0  . oxybutynin (DITROPAN) 5 MG tablet Take 2.5 mg by mouth 2 (two) times daily.   3  . oxymetazoline (AFRIN) 0.05 % nasal spray Place 1 spray into both nostrils daily as needed for congestion (seasonal allergies).    . polyethylene glycol (MIRALAX / GLYCOLAX) packet Take 17 g by mouth daily. 14 each 0  . sacubitril-valsartan (ENTRESTO) 49-51 MG Take 1 tablet by mouth 2 (two) times daily. 60 tablet 11  . senna-docusate (SENNA-S) 8.6-50 MG tablet Take 1 tablet by mouth 2 (two) times daily.    . vitamin B-12 (CYANOCOBALAMIN) 1000 MCG tablet Take 1,000 mcg by mouth daily.     No current facility-administered medications for this visit.     Allergies:   Fosamax [alendronate]; Horse-derived products; Clonidine derivatives; Darvocet [propoxyphene n-acetaminophen]; Lipitor [atorvastatin]; Sulfa drugs cross reactors; Tramadol; Vicodin [hydrocodone-acetaminophen]; Penicillins; and Septra [sulfamethoxazole-trimethoprim]   Social History: Social History   Socioeconomic History  . Marital status: Widowed    Spouse name: Not on file  . Number of children: Not on file  . Years of education: Not on file  . Highest education level: Not on file  Occupational History  . Occupation: retired Marine scientist  Social Needs  . Financial resource strain: Patient refused  . Food insecurity:    Worry: Patient refused    Inability: Patient refused  . Transportation needs:    Medical: Patient refused    Non-medical: Patient refused  Tobacco Use  . Smoking status: Never Smoker  . Smokeless tobacco: Never Used  Substance and Sexual Activity  . Alcohol use: No    Alcohol/week: 0.0 standard  drinks  . Drug use: No  . Sexual activity: Never  Lifestyle  . Physical activity:    Days per week: Patient refused    Minutes per session: Patient refused  . Stress: Patient refused  Relationships  . Social connections:    Talks on phone: Patient refused    Gets together: Patient refused    Attends religious service: Patient refused    Active member of club or organization: Not on file    Attends meetings of clubs or organizations: Patient refused    Relationship status: Patient refused  . Intimate partner violence:    Fear of current or ex partner: Patient refused    Emotionally abused: Patient refused    Physically abused: Patient refused  Forced sexual activity: Patient refused  Other Topics Concern  . Not on file  Social History Narrative  . Not on file    Family History: Family History  Problem Relation Age of Onset  . Cancer Mother        Theadora Rama   . Heart disease Mother        Before age 77  . Heart disease Sister        Before age 37-  CABG  . Cancer Sister        Lonia Chimera  . Diabetes Sister   . Hyperlipidemia Sister   . Heart attack Sister   . Diabetes Daughter   . Coronary artery disease Unknown        family hx of  . Hypertension Unknown        family hx of  . Diabetes Unknown        family hx of     Review of Systems: All other systems reviewed and are otherwise negative except as noted above.   Physical Exam: VS:  BP (!) 92/58   Pulse 64   Ht 5' (1.524 m)   Wt 128 lb (58.1 kg)   SpO2 97%   BMI 25.00 kg/m  , BMI Body mass index is 25 kg/m.  GEN- The patient is elderly appearing, alert and oriented x 3 today.   HEENT: normocephalic, atraumatic; sclera clear, conjunctiva pink; hearing intact; oropharynx clear; neck supple  Lungs- Clear to ausculation bilaterally, normal work of breathing.  No wheezes, rales, rhonchi Heart- Regular rate and rhythm  GI- soft, non-tender, non-distended, bowel sounds present  Extremities- no clubbing,  cyanosis, or edema  MS- no significant deformity or atrophy Skin- warm and dry, no rash or lesion; PPM pocket well healed Psych- euthymic mood, full affect Neuro- strength and sensation are intact  PPM Interrogation- reviewed in detail today,  See PACEART report  EKG:  EKG is not ordered today.  Recent Labs: 08/19/2018: ALT 24 10/11/2018: BUN 32; Creatinine, Ser 1.31; Hemoglobin 11.0; Platelets 222; Potassium 4.3; Sodium 136   Wt Readings from Last 3 Encounters:  10/23/18 128 lb (58.1 kg)  08/25/18 172 lb (78 kg)  02/20/18 134 lb 3.2 oz (60.9 kg)     Assessment and Plan:  1.  Transient complete heart block Normal PPM function See Pace Art report No changes today  2.  HTN Stable No change required today  3.  Chronic systolic heart failure Stable No change required today Felt to be high risk for CRTP upgrade   4.  Paroxysmal atrial fibrillation Burden by device interrogation <1% Avoiding OAC with falls and advanced age, low AF burden    Current medicines are reviewed at length with the patient today.   The patient does not have concerns regarding her medicines.  The following changes were made today:  none  Labs/ tests ordered today include: none No orders of the defined types were placed in this encounter.    Disposition:   Follow up with [facility], Dr Rayann Heman 1 year      Signed, Chanetta Marshall, NP 10/23/2018 9:50 AM  [facility] [address] [place]  [place] [address] [phone] (office) [phone] (fax)

## 2018-10-23 ENCOUNTER — Encounter: Payer: Self-pay | Admitting: Nurse Practitioner

## 2018-10-23 ENCOUNTER — Ambulatory Visit: Payer: Medicare Other | Admitting: Cardiology

## 2018-10-23 ENCOUNTER — Ambulatory Visit (INDEPENDENT_AMBULATORY_CARE_PROVIDER_SITE_OTHER): Payer: Medicare Other | Admitting: Nurse Practitioner

## 2018-10-23 ENCOUNTER — Encounter: Payer: Self-pay | Admitting: Cardiology

## 2018-10-23 VITALS — BP 92/58 | HR 64 | Ht 60.0 in | Wt 128.0 lb

## 2018-10-23 VITALS — BP 105/56 | HR 64 | Ht 60.0 in | Wt 128.0 lb

## 2018-10-23 DIAGNOSIS — I480 Paroxysmal atrial fibrillation: Secondary | ICD-10-CM

## 2018-10-23 DIAGNOSIS — I2510 Atherosclerotic heart disease of native coronary artery without angina pectoris: Secondary | ICD-10-CM

## 2018-10-23 DIAGNOSIS — I10 Essential (primary) hypertension: Secondary | ICD-10-CM

## 2018-10-23 DIAGNOSIS — E7800 Pure hypercholesterolemia, unspecified: Secondary | ICD-10-CM

## 2018-10-23 DIAGNOSIS — I442 Atrioventricular block, complete: Secondary | ICD-10-CM

## 2018-10-23 DIAGNOSIS — I5042 Chronic combined systolic (congestive) and diastolic (congestive) heart failure: Secondary | ICD-10-CM

## 2018-10-23 DIAGNOSIS — I6523 Occlusion and stenosis of bilateral carotid arteries: Secondary | ICD-10-CM

## 2018-10-23 DIAGNOSIS — I340 Nonrheumatic mitral (valve) insufficiency: Secondary | ICD-10-CM

## 2018-10-23 DIAGNOSIS — I420 Dilated cardiomyopathy: Secondary | ICD-10-CM

## 2018-10-23 NOTE — Patient Instructions (Signed)
Medication Instructions:  Your physician recommends that you continue on your current medications as directed. Please refer to the Current Medication list given to you today.  If you need a refill on your cardiac medications before your next appointment, please call your pharmacy.   Lab work:  If you have labs (blood work) drawn today and your tests are completely normal, you will receive your results only by: Marland Kitchen MyChart Message (if you have MyChart) OR . A paper copy in the mail If you have any lab test that is abnormal or we need to change your treatment, we will call you to review the results.  Follow-Up: At [facility], you and your health needs are our priority.  As part of our continuing mission to provide you with exceptional heart care, we have created designated Provider Care Teams.  These Care Teams include your primary Cardiologist (physician) and Advanced Practice Providers (APPs -  Physician Assistants and Nurse Practitioners) who all work together to provide you with the care you need, when you need it.  Your physician wants you to follow-up in: 6 months with PA. Please call our office to schedule the follow-up appointment.  You will need a follow up appointment in 1 years.  Please call our office 2 months in advance to schedule this appointment.  You may see Fransico Him, MD or one of the following Advanced Practice Providers on your designated Care Team:   [place], PA-C Melina Copa, PA-C . Ermalinda Barrios, PA-C

## 2018-10-23 NOTE — Patient Instructions (Signed)
Medication Instructions:  none If you need a refill on your cardiac medications before your next appointment, please call your pharmacy.   Lab work: none If you have labs (blood work) drawn today and your tests are completely normal, you will receive your results only by: Marland Kitchen MyChart Message (if you have MyChart) OR . A paper copy in the mail If you have any lab test that is abnormal or we need to change your treatment, we will call you to review the results.  Testing/Procedures: none  Follow-Up: Remote monitoring is used to monitor your Pacemaker  from home. This monitoring reduces the number of office visits required to check your device to one time per year. It allows [place] to keep an eye on the functioning of your device to ensure it is working properly. You are scheduled for a device check from home on 01/22/2019. You may send your transmission at any time that day. If you have a wireless device, the transmission will be sent automatically. After your physician reviews your transmission, you will receive a postcard with your next transmission date.   At [facility], you and your health needs are our priority.  As part of our continuing mission to provide you with exceptional heart care, we have created designated Provider Care Teams.  These Care Teams include your primary Cardiologist (physician) and Advanced Practice Providers (APPs -  Physician Assistants and Nurse Practitioners) who all work together to provide you with the care you need, when you need it. You will need a follow up appointment in 1 years.  Please call our office 2 months in advance to schedule this appointment.  You may see Chanetta Marshall NP or one of the following Advanced Practice Providers on your designated Care Team:   Chanetta Marshall, NP . Tommye Standard, PA-C  Any Other Special Instructions Will Be Listed Below (If Applicable).

## 2018-10-23 NOTE — Progress Notes (Signed)
Cardiology Office Note:    Date:  10/23/2018   ID:  [name], DOB 1928/01/10, MRN [id number]  PCP:  Mayra Neer, MD  Cardiologist:  Fransico Him, MD    Referring MD: Mayra Neer, MD   Chief Complaint  Patient presents with  . Cardiomyopathy  . Congestive Heart Failure  . Coronary Artery Disease    History of Present Illness:    [name] is a 84 y.o. female with a hx of  ischemic DCM, chronic diastolic CHF, ASCAD s/p CABG, carotid artery stenosis, dyslipidemia, MR s/p MV repair, CHB s/p PPM, PAF with last pacer check with 0% afib burdern and HTN.  She is followed in device clinic and AHF clinic.  She is here today for followup and is doing well.  She denies any chest pain or pressure, SOB, DOE, PND, orthopnea, LE edema, dizziness, palpitations or syncope. She is compliant with her meds and is tolerating meds with no SE.    Past Medical History:  Diagnosis Date  . Arthritis    "left wrist; back" (07/29/2013)  . Cancer [facility]) Sept. 2015   Left Hand  -  Skin Cancer  . Carotid stenosis    occluded right intracranial ICA followed by Dr. Donnetta Hutching.  1-39% left ICA stenosis by dopplers 10/2017  . Chronic combined systolic and diastolic CHF (congestive heart failure) ([place])   . Chronic kidney disease, unspecified   . Complete heart block ([place]) 07/29/2013   a. s/p MDT dual chamber PPM  . Coronary artery disease    s/p CABG  . Hyperlipidemia   . Hypertension   . Ischemic cardiomyopathy     s/p MDT single chamber ICD 2006.  EF improved and device was downgraded to dual chamber PPM 2014.  EF normalized by echo 2014 and at time of battery changeout ICD changed to PPM.  EF then decreased to 20-25% by echo 04/2016.  Seen in AHF clinic and plan was for not pursue cath given her lack of sx and advanced age. Her Delene Loll was increased and it was decided that she was too high risk for CRTP   . Osteoporosis   . Overactive bladder   . Pacemaker   . Postoperative atrial fibrillation  ([place])   . Pulmonary HTN ([place])    severe by echo 04/2016 - likely Group 2 from pulmonary venous HTN secondary to LV dysfunction and MR  . Severe mitral regurgitation    s/p MV ring.  Moderate MR and mild mitral stenosis on echo 04/2016  . Stroke ([place]) 07/2005  . Type II diabetes mellitus ([place])     Past Surgical History:  Procedure Laterality Date  . APPENDECTOMY  1949  . CARDIAC DEFIBRILLATOR PLACEMENT  11/2005   by Dr Leonia Reeves (MDT) she has a [id number] fidelis lead  . CATARACT EXTRACTION W/ INTRAOCULAR LENS  IMPLANT, BILATERAL Bilateral 1980's  . CORONARY ARTERY BYPASS GRAFT  07/2005   "CABG X3" (07/29/2013)  . FEMUR IM NAIL Left 08/20/2018   Procedure: INTRAMEDULLARY (IM) NAIL FEMORAL;  Surgeon: Frederik Pear, MD;  Location: [facility];  Service: Orthopedics;  Laterality: Left;  . MITRAL VALVE REPLACEMENT  07/2005  . PERMANENT PACEMAKER INSERTION N/A 07/29/2013   upgrade of single chamber ICD to Medtronic Adapta L DR implanted with new RA and RV leads inserted  . SALPINGOOPHORECTOMY Bilateral 1976  . SHOULDER ARTHROSCOPY W/ ROTATOR CUFF REPAIR Right 1998  . TOTAL HIP ARTHROPLASTY Right 2001    Current Medications: Current Meds  Medication Sig  .  acetaminophen (TYLENOL) 500 MG tablet Take 1,000 mg by mouth 3 (three) times daily.   Marland Kitchen aspirin EC 81 MG tablet Take 81 mg by mouth 2 (two) times daily.  . carvedilol (COREG) 12.5 MG tablet TAKE 1 TABLET (12.5 MG TOTAL) BY MOUTH 2 (TWO) TIMES DAILY.  Marland Kitchen Cholecalciferol (VITAMIN D) 2000 UNITS tablet Take 2,000 Units by mouth daily.   . fish oil-omega-3 fatty acids 1000 MG capsule Take 1 g by mouth 2 (two) times daily.   Marland Kitchen glipiZIDE (GLUCOTROL) 10 MG tablet Take 20 mg by mouth daily.  . insulin glargine (LANTUS) 100 UNIT/ML injection Inject 0.08 mLs (8 Units total) into the skin at bedtime.  . iron polysaccharides (NIFEREX) 150 MG capsule Take 1 capsule (150 mg total) by mouth daily.  . Multiple Vitamin (MULTIVITAMIN WITH MINERALS) TABS tablet Take 1 tablet  by mouth daily. Centrum Silver  . nitroGLYCERIN (NITROSTAT) 0.4 MG SL tablet Place 0.4 mg under the tongue every 5 (five) minutes as needed for chest pain (max 3 doses/ recheck vital signs 30 minutes after admnistering).  . ondansetron (ZOFRAN) 4 MG tablet Take 1 tablet (4 mg total) by mouth every 6 (six) hours as needed for nausea.  Marland Kitchen oxybutynin (DITROPAN) 5 MG tablet Take 2.5 mg by mouth 2 (two) times daily.   Marland Kitchen oxymetazoline (AFRIN) 0.05 % nasal spray Place 1 spray into both nostrils daily as needed for congestion (seasonal allergies).  . polyethylene glycol (MIRALAX / GLYCOLAX) packet Take 17 g by mouth daily.  . sacubitril-valsartan (ENTRESTO) 49-51 MG Take 1 tablet by mouth 2 (two) times daily.  Marland Kitchen senna-docusate (SENNA-S) 8.6-50 MG tablet Take 1 tablet by mouth 2 (two) times daily.  . vitamin B-12 (CYANOCOBALAMIN) 1000 MCG tablet Take 1,000 mcg by mouth daily.     Allergies:   Fosamax [alendronate]; Horse-derived products; Clonidine derivatives; Darvocet [propoxyphene n-acetaminophen]; Lipitor [atorvastatin]; Sulfa drugs cross reactors; Tramadol; Vicodin [hydrocodone-acetaminophen]; Penicillins; and Septra [sulfamethoxazole-trimethoprim]   Social History   Socioeconomic History  . Marital status: Widowed    Spouse name: Not on file  . Number of children: Not on file  . Years of education: Not on file  . Highest education level: Not on file  Occupational History  . Occupation: retired Marine scientist  Social Needs  . Financial resource strain: Patient refused  . Food insecurity:    Worry: Patient refused    Inability: Patient refused  . Transportation needs:    Medical: Patient refused    Non-medical: Patient refused  Tobacco Use  . Smoking status: Never Smoker  . Smokeless tobacco: Never Used  Substance and Sexual Activity  . Alcohol use: No    Alcohol/week: 0.0 standard drinks  . Drug use: No  . Sexual activity: Never  Lifestyle  . Physical activity:    Days per week: Patient  refused    Minutes per session: Patient refused  . Stress: Patient refused  Relationships  . Social connections:    Talks on phone: Patient refused    Gets together: Patient refused    Attends religious service: Patient refused    Active member of club or organization: Not on file    Attends meetings of clubs or organizations: Patient refused    Relationship status: Patient refused  Other Topics Concern  . Not on file  Social History Narrative  . Not on file     Family History: The patient's family history includes Cancer in her mother and sister; Coronary artery disease in her unknown relative; Diabetes  in her daughter, sister, and unknown relative; Heart attack in her sister; Heart disease in her mother and sister; Hyperlipidemia in her sister; Hypertension in her unknown relative.  ROS:   Please see the history of present illness.    ROS  All other systems reviewed and negative.   EKGs/Labs/Other Studies Reviewed:    The following studies were reviewed today: none  EKG:  EKG is not ordered today.    Recent Labs: 08/19/2018: ALT 24 10/11/2018: BUN 32; Creatinine, Ser 1.31; Hemoglobin 11.0; Platelets 222; Potassium 4.3; Sodium 136   Recent Lipid Panel    Component Value Date/Time   CHOL 250 (H) 02/20/2018 1100   TRIG 129 02/20/2018 1100   HDL 51 02/20/2018 1100   CHOLHDL 4.9 (H) 02/20/2018 1100   CHOLHDL 2.9 12/18/2016 0852   VLDL 17 12/18/2016 0852   LDLCALC 173 (H) 02/20/2018 1100    Physical Exam:    VS:  BP (!) 94/52   Pulse 64   Ht 5' (1.524 m)   Wt 128 lb (58.1 kg) Comment: Pt stated weight  SpO2 97%   BMI 25.00 kg/m     Wt Readings from Last 3 Encounters:  10/23/18 128 lb (58.1 kg)  10/23/18 128 lb (58.1 kg)  08/25/18 172 lb (78 kg)     GEN:  Well nourished, well developed in no acute distress HEENT: Normal NECK: No JVD; No carotid bruits LYMPHATICS: No lymphadenopathy CARDIAC: RRR, no murmurs, rubs, gallops RESPIRATORY:  Clear to  auscultation without rales, wheezing or rhonchi  ABDOMEN: Soft, non-tender, non-distended MUSCULOSKELETAL:  No edema; No deformity  SKIN: Warm and dry NEUROLOGIC:  Alert and oriented x 3 PSYCHIATRIC:  Normal affect   ASSESSMENT:    1. PAF (paroxysmal atrial fibrillation) ([place])   2. Essential hypertension   3. Coronary artery disease involving native coronary artery of native heart without angina pectoris   4. Bilateral carotid artery stenosis   5. Complete heart block ([place])   6. Severe mitral regurgitation   7. Chronic combined systolic and diastolic CHF (congestive heart failure) ([place])   8. DCM (dilated cardiomyopathy) ([place])   9. Pure hypercholesterolemia    PLAN:    In order of problems listed above:  1.  PAF -she is maintaining normal sinus rhythm on exam today.  She will continue on carvedilol 12.5 mg twice daily.  She is not on anticoagulation due to advanced age and frequent falls.  2.  HTN -BP is well controlled on exam today.  She will continue on carvedilol 12.5 mg twice daily and Entresto 49-51 mg twice daily.  Her creatinine was stable at 1.3 on 10/11/2018 and potassium 4.3.  3.  ASCAD -status post remote CABG.  She denies any anginal symptoms.  She will continue on aspirin 81 mg daily, beta-blocker.  She is not on a statin drug due to statin intolerance.  4.  Bilateral carotid artery  - RICA occluded and LICA with 9-44% stenosis - followed by Dr. Donnetta Hutching.  She will continue on aspirin.  5.  CHB s/p PPM -she is followed in device clinic.  6.  Severe MR - s/p MV repair at the time of her CABG - her last echo showed mild MS with mean MV gradient of 39mm and MVA by PHT of 1.28cm 2.  7.  Chronic combined systolic/diastolic CHF - she appears euvolemic on exam today and her weight is stable.  She will continue on carvedilol 12.5 mg twice daily, Entresto 49-51 mg twice daily.  She has not required any diuretic therapy recently.  8.  DCM - ischemic in origin with last echo in  2014 showing resolution with EF 55%.  She had her ICD replaced with a PPM and then subsequent echo 04/2016 showed EF 20-25% with RV dysfunction.  Nuclear stress test 05/2016 showed no ischemia.  She was seen in AHF clinic and plan was for not pursue cath given her lack of sx and advanced age. Her Delene Loll was increased and it was decided that she was too high risk for CRTP upgrade with advanced age and fragility. Pt opted for conservative approach.  Echo 06/06/2017 showed EF 30 to 35%.  9.  Hyperlipidemia - LDL is < 70.  Her LDL was 173 on 02/20/2018.  She is statin intolerant.  Given her advanced age and comorbidities it was decided not to pursue PCSK9 drug.   Medication Adjustments/Labs and Tests Ordered: Current medicines are reviewed at length with the patient today.  Concerns regarding medicines are outlined above.  No orders of the defined types were placed in this encounter.  No orders of the defined types were placed in this encounter.   Signed, Fransico Him, MD  10/23/2018 10:17 AM    [place]

## 2018-11-15 ENCOUNTER — Encounter (HOSPITAL_COMMUNITY): Payer: Self-pay | Admitting: Emergency Medicine

## 2018-11-15 ENCOUNTER — Emergency Department (HOSPITAL_COMMUNITY): Payer: Medicare Other

## 2018-11-15 ENCOUNTER — Emergency Department (HOSPITAL_COMMUNITY)
Admission: EM | Admit: 2018-11-15 | Discharge: 2018-11-15 | Disposition: A | Discharge status: Home / Self Care | Payer: Medicare Other | Attending: Emergency Medicine | Admitting: Emergency Medicine

## 2018-11-15 DIAGNOSIS — S01311A Laceration without foreign body of right ear, initial encounter: Secondary | ICD-10-CM | POA: Insufficient documentation

## 2018-11-15 DIAGNOSIS — I130 Hypertensive heart and chronic kidney disease with heart failure and stage 1 through stage 4 chronic kidney disease, or unspecified chronic kidney disease: Secondary | ICD-10-CM | POA: Insufficient documentation

## 2018-11-15 DIAGNOSIS — Z951 Presence of aortocoronary bypass graft: Secondary | ICD-10-CM | POA: Insufficient documentation

## 2018-11-15 DIAGNOSIS — I5042 Chronic combined systolic (congestive) and diastolic (congestive) heart failure: Secondary | ICD-10-CM | POA: Diagnosis not present

## 2018-11-15 DIAGNOSIS — Y939 Activity, unspecified: Secondary | ICD-10-CM | POA: Diagnosis not present

## 2018-11-15 DIAGNOSIS — N183 Chronic kidney disease, stage 3 (moderate): Secondary | ICD-10-CM | POA: Insufficient documentation

## 2018-11-15 DIAGNOSIS — W19XXXA Unspecified fall, initial encounter: Secondary | ICD-10-CM

## 2018-11-15 DIAGNOSIS — W0110XA Fall on same level from slipping, tripping and stumbling with subsequent striking against unspecified object, initial encounter: Secondary | ICD-10-CM | POA: Diagnosis not present

## 2018-11-15 DIAGNOSIS — Y92121 Bathroom in nursing home as the place of occurrence of the external cause: Secondary | ICD-10-CM | POA: Insufficient documentation

## 2018-11-15 DIAGNOSIS — Y999 Unspecified external cause status: Secondary | ICD-10-CM | POA: Insufficient documentation

## 2018-11-15 DIAGNOSIS — I2510 Atherosclerotic heart disease of native coronary artery without angina pectoris: Secondary | ICD-10-CM | POA: Insufficient documentation

## 2018-11-15 DIAGNOSIS — Z794 Long term (current) use of insulin: Secondary | ICD-10-CM | POA: Diagnosis not present

## 2018-11-15 DIAGNOSIS — Z96641 Presence of right artificial hip joint: Secondary | ICD-10-CM | POA: Diagnosis not present

## 2018-11-15 DIAGNOSIS — S0990XA Unspecified injury of head, initial encounter: Secondary | ICD-10-CM | POA: Diagnosis present

## 2018-11-15 DIAGNOSIS — Z950 Presence of cardiac pacemaker: Secondary | ICD-10-CM | POA: Diagnosis not present

## 2018-11-15 DIAGNOSIS — E1122 Type 2 diabetes mellitus with diabetic chronic kidney disease: Secondary | ICD-10-CM | POA: Insufficient documentation

## 2018-11-15 MED ORDER — LIDOCAINE HCL (PF) 1 % IJ SOLN
30.0000 mL | Freq: Once | INTRAMUSCULAR | Status: AC
  Administered 2018-11-15: 30 mL
  Filled 2018-11-15: qty 30

## 2018-11-15 NOTE — ED Notes (Signed)
[facility] notified that pt is ready for transport

## 2018-11-15 NOTE — ED Provider Notes (Signed)
[facility]ERG[facility]Y DEPARTMENT Provider Note   CSN: [id number] Arrival date & time: 11/15/18  0250     History   Chief Complaint Chief Complaint  Patient presents with  . Fall  . Otalgia    HPI [name] is a 84 y.o. female.  The history is provided by the patient and a relative.  Fall  This is a new problem. Episode onset: prior to arrival. The problem occurs constantly. The problem has not changed since onset.Associated symptoms include headaches. Pertinent negatives include no chest pain. Nothing aggravates the symptoms. Nothing relieves the symptoms.  Otalgia  Associated symptoms include headaches. Pertinent negatives include no neck pain.   Patient with history of cardiomyopathy, hypertension, CAD presents with right ear pain after fall.  She reports she stays at a local nursing facility, she tried going to bathroom and lost her footing and fell injuring her right ear.  She reports laceration to the ear.  She takes aspirin.  No other anticoagulants. No headache.  No other acute complaints.  Denies neck pain. No LOC Past Medical History:  Diagnosis Date  . Arthritis    "left wrist; back" (07/29/2013)  . Cancer [facility]) Sept. 2015   Left Hand  -  Skin Cancer  . Carotid stenosis    occluded right intracranial ICA followed by Dr. Donnetta Hutching.  1-39% left ICA stenosis by dopplers 10/2017  . Chronic combined systolic and diastolic CHF (congestive heart failure) ([place])   . Chronic kidney disease, unspecified   . Complete heart block ([place]) 07/29/2013   a. s/p MDT dual chamber PPM  . Coronary artery disease    s/p CABG  . Hyperlipidemia   . Hypertension   . Ischemic cardiomyopathy     s/p MDT single chamber ICD 2006.  EF improved and device was downgraded to dual chamber PPM 2014.  EF normalized by echo 2014 and at time of battery changeout ICD changed to PPM.  EF then decreased to 20-25% by echo 04/2016.  Seen in AHF clinic and plan was for not pursue cath  given her lack of sx and advanced age. Her Delene Loll was increased and it was decided that she was too high risk for CRTP   . Osteoporosis   . Overactive bladder   . Pacemaker   . Postoperative atrial fibrillation ([place])   . Pulmonary HTN ([place])    severe by echo 04/2016 - likely Group 2 from pulmonary venous HTN secondary to LV dysfunction and MR  . Severe mitral regurgitation    s/p MV ring.  Moderate MR and mild mitral stenosis on echo 04/2016  . Stroke ([place]) 07/2005  . Type II diabetes mellitus [facility])     Patient Active Problem List   Diagnosis Date Noted  . Status post mitral valve repair 09/22/2018  . S/P CABG (coronary artery bypass graft) 09/22/2018  . Status post placement of cardiac pacemaker 09/22/2018  . Closed left hip fracture ([place]) 08/19/2018  . Macrocytic anemia 08/19/2018  . DCM (dilated cardiomyopathy) ([place]) 02/20/2018  . Heart murmur 05/14/2016  . Fall 11/17/2015  . Scalp laceration 11/17/2015  . Facial contusion 11/17/2015  . Rib fracture 11/16/2015  . Paresthesia of both hands 01/18/2014  . Renal insufficiency 01/18/2014  . Lower extremity weakness 01/18/2014  . Coronary artery disease   . Type II diabetes mellitus with renal manifestations ([place])   . Hyperlipidemia   . Carotid stenosis   . Complete heart block ([place])   . Severe mitral regurgitation   .  Chronic combined systolic and diastolic CHF (congestive heart failure) ([place])   . PAF (paroxysmal atrial fibrillation) ([place]) 10/29/2013  . 1950 lead 07/21/2013  . CKD (chronic kidney disease), stage III ([place])   . Hypertension 04/30/2011    Past Surgical History:  Procedure Laterality Date  . APPENDECTOMY  1949  . CARDIAC DEFIBRILLATOR PLACEMENT  11/2005   by Dr Leonia Reeves (MDT) she has a [phone] fidelis lead  . CATARACT EXTRACTION W/ INTRAOCULAR LENS  IMPLANT, BILATERAL Bilateral 1980's  . CORONARY ARTERY BYPASS GRAFT  07/2005   "CABG X3" (07/29/2013)  . FEMUR IM NAIL Left 08/20/2018   Procedure: INTRAMEDULLARY  (IM) NAIL FEMORAL;  Surgeon: Frederik Pear, MD;  Location: [facility] ORS;  Service: Orthopedics;  Laterality: Left;  . MITRAL VALVE REPLACEMENT  07/2005  . PERMANENT PACEMAKER INSERTION N/A 07/29/2013   upgrade of single chamber ICD to Medtronic Adapta L DR implanted with new RA and RV leads inserted  . SALPINGOOPHORECTOMY Bilateral 1976  . SHOULDER ARTHROSCOPY W/ ROTATOR CUFF REPAIR Right 1998  . TOTAL HIP ARTHROPLASTY Right 2001     OB History   None      Home Medications    Prior to Admission medications   Medication Sig Start Date End Date Taking? Authorizing Provider  acetaminophen (TYLENOL) 500 MG tablet Take 1,000 mg by mouth 3 (three) times daily.    Yes [provider]  aspirin EC 81 MG tablet Take 81 mg by mouth 2 (two) times daily.   Yes [provider]  carvedilol (COREG) 12.5 MG tablet TAKE 1 TABLET (12.5 MG TOTAL) BY MOUTH 2 (TWO) TIMES DAILY. Patient taking differently: Take 12.5 mg by mouth 2 (two) times daily with a meal.  06/03/18  Yes Turner, Eber Hong, MD  Cholecalciferol (VITAMIN D) 2000 UNITS tablet Take 2,000 Units by mouth daily.    Yes [provider]  fish oil-omega-3 fatty acids 1000 MG capsule Take 1 g by mouth 2 (two) times daily.    Yes [provider]  furosemide (LASIX) 40 MG tablet Take 40 mg by mouth daily as needed for fluid or edema.   Yes [provider]  glipiZIDE (GLUCOTROL) 10 MG tablet Take 20 mg by mouth daily. 06/13/18  Yes [provider]  insulin glargine (LANTUS) 100 UNIT/ML injection Inject 0.08 mLs (8 Units total) into the skin at bedtime. 08/25/18  Yes Georgette Shell, MD  iron polysaccharides (NIFEREX) 150 MG capsule Take 1 capsule (150 mg total) by mouth daily. 08/26/18  Yes Georgette Shell, MD  Lidocaine (ASPERCREME LIDOCAINE) 4 % PTCH Apply topically daily. To back, Left Hip and Thigh   Yes [provider]  Multiple Vitamin (MULTIVITAMIN WITH MINERALS) TABS tablet Take 1 tablet  by mouth daily. Centrum Silver   Yes [provider]  nitroGLYCERIN (NITROSTAT) 0.4 MG SL tablet Place 0.4 mg under the tongue every 5 (five) minutes as needed for chest pain (max 3 doses/ recheck vital signs 30 minutes after admnistering).   Yes [provider]  ondansetron (ZOFRAN) 4 MG tablet Take 1 tablet (4 mg total) by mouth every 6 (six) hours as needed for nausea. 08/25/18  Yes Georgette Shell, MD  oxybutynin (DITROPAN) 5 MG tablet Take 2.5 mg by mouth 2 (two) times daily.  05/02/15  Yes [provider]  oxymetazoline (AFRIN) 0.05 % nasal spray Place 1 spray into both nostrils daily as needed for congestion (seasonal allergies).   Yes [provider]  polyethylene glycol (MIRALAX /  GLYCOLAX) packet Take 17 g by mouth daily. 08/26/18  Yes Georgette Shell, MD  sacubitril-valsartan (ENTRESTO) 49-51 MG Take 1 tablet by mouth 2 (two) times daily. 07/07/18  Yes Larey Dresser, MD  senna-docusate (SENNA-S) 8.6-50 MG tablet Take 1 tablet by mouth 2 (two) times daily.   Yes [provider]  vitamin B-12 (CYANOCOBALAMIN) 1000 MCG tablet Take 1,000 mcg by mouth daily.   Yes [provider]    Family History Family History  Problem Relation Age of Onset  . Cancer Mother        Theadora Rama   . Heart disease Mother        Before age 65  . Heart disease Sister        Before age 53-  CABG  . Cancer Sister        Lonia Chimera  . Diabetes Sister   . Hyperlipidemia Sister   . Heart attack Sister   . Diabetes Daughter   . Coronary artery disease Unknown        family hx of  . Hypertension Unknown        family hx of  . Diabetes Unknown        family hx of    Social History Social History   Tobacco Use  . Smoking status: Never Smoker  . Smokeless tobacco: Never Used  Substance Use Topics  . Alcohol use: No    Alcohol/week: 0.0 standard drinks  . Drug use: No     Allergies   Fosamax [alendronate]; Horse-derived products; Clonidine  derivatives; Darvocet [propoxyphene n-acetaminophen]; Lipitor [atorvastatin]; Sulfa drugs cross reactors; Tramadol; Vicodin [hydrocodone-acetaminophen]; Penicillins; and Septra [sulfamethoxazole-trimethoprim]   Review of Systems Review of Systems  HENT: Positive for ear pain.   Cardiovascular: Negative for chest pain.  Musculoskeletal: Negative for back pain and neck pain.  Skin: Positive for wound.  Neurological: Positive for headaches.  All other systems reviewed and are negative.    Physical Exam Updated Vital Signs BP (!) 142/52 (BP Location: Right Arm)   Pulse (!) 58   Temp 98.7 F (37.1 C)   Resp 16   SpO2 97%   Physical Exam  CONSTITUTIONAL: Elderly/frail HEAD: Normocephalic/atraumatic EYES: EOMI ENMT: Mucous membranes moist, horizontal laceration noted to the right ear with cartilage exposed, no mastoid trauma/crepitus noted No signs of facial trauma NECK: supple no meningeal signs SPINE/BACK:entire spine nontender, No bruising/crepitance/stepoffs noted to spine CV: S1/S2 noted, no murmurs/rubs/gallops noted LUNGS: Lungs are clear to auscultation bilaterally, no apparent distress ABDOMEN: soft, nontender NEURO: Pt is awake/alert/appropriate, moves all extremitiesx4.  No facial droop.  Pelvis stable, all extremities/joints palpated/ranged and nontender EXTREMITIES: pulses normal/equal, full ROM SKIN: warm, color normal PSYCH: no abnormalities of mood noted, alert and oriented to situation  ED Treatments / Results  Labs (all labs ordered are listed, but only abnormal results are displayed) Labs Reviewed - No data to display  EKG None  Radiology Ct Head Wo Contrast  Result Date: 11/15/2018 CLINICAL DATA:  Unwitnessed fall, RIGHT ear laceration. EXAM: CT HEAD WITHOUT CONTRAST TECHNIQUE: Contiguous axial images were obtained from the base of the skull through the vertex without intravenous contrast. COMPARISON:  CT HEAD October 11, 2018 FINDINGS: BRAIN: No  intraparenchymal hemorrhage, mass effect nor midline shift. Old RIGHT basal ganglia infarct with mild ex vacuo dilatation RIGHT frontal horn of the lateral ventricle. Patchy supratentorial white matter hypodensities within normal range for patient's age, though non-specific are most compatible with chronic small vessel ischemic disease. Old  small LEFT cerebellar infarct. No acute large vascular territory infarcts. No abnormal extra-axial fluid collections. Basal cisterns are patent. VASCULAR: Severe calcific atherosclerosis of the carotid siphons. Moderate vertebral artery atherosclerosis. SKULL: No skull fracture. No significant scalp soft tissue swelling. SINUSES/ORBITS: Mild paranasal sinus mucosal thickening. Mastoid air cells are well aerated.The included ocular globes and orbital contents are non-suspicious. Status post bilateral ocular lens implants. OTHER: None. IMPRESSION: 1. No acute intracranial process. 2. Stable examination including severe atherosclerosis and old LEFT cerebellar infarct. Electronically Signed   By: Elon Alas M.D.   On: 11/15/2018 05:35    Procedures Procedures   LACERATION REPAIR Performed by: Sharyon Cable Consent: Verbal consent obtained. Risks and benefits: risks, benefits and alternatives were discussed Patient identity confirmed: provided demographic data Time out performed prior to procedure Prepped and Draped in normal sterile fashion Wound explored Laceration Location: right ear Laceration Length: 3cm No Foreign Bodies seen or palpated Anesthesia: local infiltration Local anesthetic: lidocaine  Anesthetic total: 7 ml Amount of cleaning: standard Skin closure: interrupted Number of sutures or staples: 7 vicryl Technique: simple interrupted Patient tolerance: Patient tolerated the procedure well with no immediate complications.  Medications Ordered in ED Medications  lidocaine (PF) (XYLOCAINE) 1 % injection 30 mL (has no administration in  time range)     Initial Impression / Assessment and Plan / ED Course  I have reviewed the triage vital signs and the nursing notes.     Patient presents after mechanical fall.  CT head negative.  She sustained laceration to her right ear with cartilage exposure.  I was able to repair the wound with good approximation, all cartilage was covered.  Wound was bandaged by nursing.   Final Clinical Impressions(s) / ED Diagnoses   Final diagnoses:  Fall, initial encounter  Complex laceration of right ear, initial encounter    ED Discharge Orders    None       Ripley Fraise, MD 11/15/18 [id number]

## 2018-11-15 NOTE — ED Triage Notes (Signed)
Per EMS, pt from [facility], had a unwitnessed fall from her bed.  She complains of right shoulder pain and right ear pain (small laceration).  Takes ASA.  Hx of Dementia, but alert and oriented.

## 2019-02-01 ENCOUNTER — Other Ambulatory Visit: Payer: Self-pay

## 2019-02-01 ENCOUNTER — Encounter (HOSPITAL_COMMUNITY): Payer: Self-pay

## 2019-02-01 ENCOUNTER — Emergency Department (HOSPITAL_COMMUNITY)
Admission: EM | Admit: 2019-02-01 | Discharge: 2019-02-01 | Disposition: A | Discharge status: Other Acute Inpatient Hospital | Payer: Medicare Other | Attending: Emergency Medicine | Admitting: Emergency Medicine

## 2019-02-01 ENCOUNTER — Emergency Department (HOSPITAL_COMMUNITY): Payer: Medicare Other

## 2019-02-01 DIAGNOSIS — Z950 Presence of cardiac pacemaker: Secondary | ICD-10-CM | POA: Diagnosis not present

## 2019-02-01 DIAGNOSIS — Z85828 Personal history of other malignant neoplasm of skin: Secondary | ICD-10-CM | POA: Diagnosis not present

## 2019-02-01 DIAGNOSIS — Y999 Unspecified external cause status: Secondary | ICD-10-CM | POA: Insufficient documentation

## 2019-02-01 DIAGNOSIS — Z794 Long term (current) use of insulin: Secondary | ICD-10-CM | POA: Insufficient documentation

## 2019-02-01 DIAGNOSIS — Y92129 Unspecified place in nursing home as the place of occurrence of the external cause: Secondary | ICD-10-CM | POA: Insufficient documentation

## 2019-02-01 DIAGNOSIS — E1122 Type 2 diabetes mellitus with diabetic chronic kidney disease: Secondary | ICD-10-CM | POA: Insufficient documentation

## 2019-02-01 DIAGNOSIS — W19XXXA Unspecified fall, initial encounter: Secondary | ICD-10-CM | POA: Diagnosis not present

## 2019-02-01 DIAGNOSIS — Y939 Activity, unspecified: Secondary | ICD-10-CM | POA: Insufficient documentation

## 2019-02-01 DIAGNOSIS — S51011A Laceration without foreign body of right elbow, initial encounter: Secondary | ICD-10-CM | POA: Diagnosis not present

## 2019-02-01 DIAGNOSIS — S0990XA Unspecified injury of head, initial encounter: Secondary | ICD-10-CM | POA: Diagnosis not present

## 2019-02-01 DIAGNOSIS — I130 Hypertensive heart and chronic kidney disease with heart failure and stage 1 through stage 4 chronic kidney disease, or unspecified chronic kidney disease: Secondary | ICD-10-CM | POA: Diagnosis not present

## 2019-02-01 DIAGNOSIS — Z23 Encounter for immunization: Secondary | ICD-10-CM | POA: Insufficient documentation

## 2019-02-01 DIAGNOSIS — Z7982 Long term (current) use of aspirin: Secondary | ICD-10-CM | POA: Diagnosis not present

## 2019-02-01 DIAGNOSIS — I5042 Chronic combined systolic (congestive) and diastolic (congestive) heart failure: Secondary | ICD-10-CM | POA: Diagnosis not present

## 2019-02-01 DIAGNOSIS — N183 Chronic kidney disease, stage 3 (moderate): Secondary | ICD-10-CM | POA: Insufficient documentation

## 2019-02-01 MED ORDER — LIDOCAINE-EPINEPHRINE-TETRACAINE (LET) SOLUTION
3.0000 mL | Freq: Once | NASAL | Status: AC
  Administered 2019-02-01: 3 mL via TOPICAL
  Filled 2019-02-01: qty 3

## 2019-02-01 MED ORDER — BACITRACIN ZINC 500 UNIT/GM EX OINT: TOPICAL_OINTMENT | Freq: Two times a day (BID) | CUTANEOUS | Status: DC

## 2019-02-01 MED ORDER — LIDOCAINE HCL (PF) 1 % IJ SOLN
5.0000 mL | Freq: Once | INTRAMUSCULAR | Status: AC
  Administered 2019-02-01: 5 mL
  Filled 2019-02-01: qty 30

## 2019-02-01 MED ORDER — TETANUS-DIPHTH-ACELL PERTUSSIS 5-2.5-18.5 LF-MCG/0.5 IM SUSP
0.5000 mL | Freq: Once | INTRAMUSCULAR | Status: AC
  Administered 2019-02-01: 0.5 mL via INTRAMUSCULAR
  Filled 2019-02-01: qty 0.5

## 2019-02-01 NOTE — ED Notes (Signed)
Bed: YI50 Expected date:  Expected time:  Means of arrival:  Comments: EMS 85 yo female from facility-unwitnessed fall-hematoma to head-hit head on tile-no blood thinners

## 2019-02-01 NOTE — ED Provider Notes (Signed)
[facility] DEPT Provider Note   CSN: [id number] Arrival date & time: 02/01/19  0518     History   Chief Complaint Chief Complaint  Patient presents with  . Fall    HPI [name] is a 85 y.o. female presenting from nursing facility following fall that occurred tonight.  Patient reports that she was attempting to ambulate to the bathroom without assistance and reached for the doorknob when she missed falling to the ground.  EMS was called and patient brought to ER, patient with hematoma to occipital scalp in addition to laceration of the right elbow.  On my initial evaluation patient sleeping comfortably, easily arousable to voice.  Denying any and all pain and states that she feels well.  Chart review shows no blood thinner prescriptions minus aspirin.  She denies syncope, loss of consciousness, neck pain, back pain, extremity weakness/numbness or tingling or any additional concerns.  She denies saddle paresthesias or bowel/bladder incontinence.  Patient responding to all questions and commands appropriately.  HPI  Past Medical History:  Diagnosis Date  . Arthritis    "left wrist; back" (07/29/2013)  . Cancer [facility]) Sept. 2015   Left Hand  -  Skin Cancer  . Carotid stenosis    occluded right intracranial ICA followed by Dr. Donnetta Hutching.  1-39% left ICA stenosis by dopplers 10/2017  . Chronic combined systolic and diastolic CHF (congestive heart failure) ([place])   . Chronic kidney disease, unspecified   . Complete heart block ([place]) 07/29/2013   a. s/p MDT dual chamber PPM  . Coronary artery disease    s/p CABG  . Hyperlipidemia   . Hypertension   . Ischemic cardiomyopathy     s/p MDT single chamber ICD 2006.  EF improved and device was downgraded to dual chamber PPM 2014.  EF normalized by echo 2014 and at time of battery changeout ICD changed to PPM.  EF then decreased to 20-25% by echo 04/2016.  Seen in AHF clinic and plan was for not pursue cath  given her lack of sx and advanced age. Her Delene Loll was increased and it was decided that she was too high risk for CRTP   . Osteoporosis   . Overactive bladder   . Pacemaker   . Postoperative atrial fibrillation ([place])   . Pulmonary HTN ([place])    severe by echo 04/2016 - likely Group 2 from pulmonary venous HTN secondary to LV dysfunction and MR  . Severe mitral regurgitation    s/p MV ring.  Moderate MR and mild mitral stenosis on echo 04/2016  . Stroke ([place]) 07/2005  . Type II diabetes mellitus [facility])     Patient Active Problem List   Diagnosis Date Noted  . Status post mitral valve repair 09/22/2018  . S/P CABG (coronary artery bypass graft) 09/22/2018  . Status post placement of cardiac pacemaker 09/22/2018  . Closed left hip fracture ([place]) 08/19/2018  . Macrocytic anemia 08/19/2018  . DCM (dilated cardiomyopathy) ([place]) 02/20/2018  . Heart murmur 05/14/2016  . Fall 11/17/2015  . Scalp laceration 11/17/2015  . Facial contusion 11/17/2015  . Rib fracture 11/16/2015  . Paresthesia of both hands 01/18/2014  . Renal insufficiency 01/18/2014  . Lower extremity weakness 01/18/2014  . Coronary artery disease   . Type II diabetes mellitus with renal manifestations ([place])   . Hyperlipidemia   . Carotid stenosis   . Complete heart block ([place])   . Severe mitral regurgitation   . Chronic combined systolic and  diastolic CHF (congestive heart failure) ([place])   . PAF (paroxysmal atrial fibrillation) ([place]) 10/29/2013  . 5093 lead 07/21/2013  . CKD (chronic kidney disease), stage III ([place])   . Hypertension 04/30/2011    Past Surgical History:  Procedure Laterality Date  . APPENDECTOMY  1949  . CARDIAC DEFIBRILLATOR PLACEMENT  11/2005   by Dr Leonia Reeves (MDT) she has a [phone] fidelis lead  . CATARACT EXTRACTION W/ INTRAOCULAR LENS  IMPLANT, BILATERAL Bilateral 1980's  . CORONARY ARTERY BYPASS GRAFT  07/2005   "CABG X3" (07/29/2013)  . FEMUR IM NAIL Left 08/20/2018   Procedure: INTRAMEDULLARY  (IM) NAIL FEMORAL;  Surgeon: Frederik Pear, MD;  Location: [facility] ORS;  Service: Orthopedics;  Laterality: Left;  . MITRAL VALVE REPLACEMENT  07/2005  . PERMANENT PACEMAKER INSERTION N/A 07/29/2013   upgrade of single chamber ICD to Med[employer]onic Adapta L DR implanted with new RA and RV leads inserted  . SALPINGOOPHORECTOMY Bilateral 1976  . SHOULDER ARTHROSCOPY W/ ROTATOR CUFF REPAIR Right 1998  . TOTAL HIP ARTHROPLASTY Right 2001     OB History   No obstetric history on file.      Home Medications    Prior to Admission medications   Medication Sig Start Date End Date Taking? Authorizing Provider  acetaminophen (TYLENOL) 500 MG tablet Take 1,000 mg by mouth 3 (three) times daily.     [provider]  aspirin EC 81 MG tablet Take 81 mg by mouth 2 (two) times daily.    [provider]  carvedilol (COREG) 12.5 MG tablet TAKE 1 TABLET (12.5 MG TOTAL) BY MOUTH 2 (TWO) TIMES DAILY. Patient taking differently: Take 12.5 mg by mouth 2 (two) times daily with a meal.  06/03/18   Turner, Eber Hong, MD  Cholecalciferol (VITAMIN D) 2000 UNITS tablet Take 2,000 Units by mouth daily.     [provider]  fish oil-omega-3 fatty acids 1000 MG capsule Take 1 g by mouth 2 (two) times daily.     [provider]  furosemide (LASIX) 40 MG tablet Take 40 mg by mouth daily as needed for fluid or edema.    [provider]  glipiZIDE (GLUCOTROL) 10 MG tablet Take 20 mg by mouth daily. 06/13/18   [provider]  insulin glargine (LANTUS) 100 UNIT/ML injection Inject 0.08 mLs (8 Units total) into the skin at bedtime. 08/25/18   Georgette Shell, MD  iron polysaccharides (NIFEREX) 150 MG capsule Take 1 capsule (150 mg total) by mouth daily. 08/26/18   Georgette Shell, MD  Lidocaine (ASPERCREME LIDOCAINE) 4 % PTCH Apply topically daily. To back, Left Hip and Thigh    [provider]  Multiple Vitamin (MULTIVITAMIN WITH MINERALS) TABS tablet Take 1 tablet by  mouth daily. Centrum Silver    [provider]  nitroGLYCERIN (NITROSTAT) 0.4 MG SL tablet Place 0.4 mg under the tongue every 5 (five) minutes as needed for chest pain (max 3 doses/ recheck vital signs 30 minutes after admnistering).    [provider]  ondansetron (ZOFRAN) 4 MG tablet Take 1 tablet (4 mg total) by mouth every 6 (six) hours as needed for nausea. 08/25/18   Georgette Shell, MD  oxybutynin (DITROPAN) 5 MG tablet Take 2.5 mg by mouth 2 (two) times daily.  05/02/15   [provider]  oxymetazoline (AFRIN) 0.05 % nasal spray Place 1 spray into both nostrils daily as needed for congestion (seasonal allergies).    [provider]  polyethylene glycol (MIRALAX /  GLYCOLAX) packet Take 17 g by mouth daily. 08/26/18   Georgette Shell, MD  sacubitril-valsartan (ENTRESTO) 49-51 MG Take 1 tablet by mouth 2 (two) times daily. 07/07/18   Larey Dresser, MD  senna-docusate (SENNA-S) 8.6-50 MG tablet Take 1 tablet by mouth 2 (two) times daily.    [provider]  vitamin B-12 (CYANOCOBALAMIN) 1000 MCG tablet Take 1,000 mcg by mouth daily.    [provider]    Family History Family History  Problem Relation Age of Onset  . Cancer Mother        Theadora Rama   . Heart disease Mother        Before age 31  . Heart disease Sister        Before age 60-  CABG  . Cancer Sister        Lonia Chimera  . Diabetes Sister   . Hyperlipidemia Sister   . Heart attack Sister   . Diabetes Daughter   . Coronary artery disease Other        family hx of  . Hypertension Other        family hx of  . Diabetes Other        family hx of    Social History Social History   Tobacco Use  . Smoking status: Never Smoker  . Smokeless tobacco: Never Used  Substance Use Topics  . Alcohol use: No    Alcohol/week: 0.0 standard drinks  . Drug use: No     Allergies   Fosamax [alendronate]; Horse-derived products; Clonidine derivatives; Darvocet [propoxyphene  n-acetaminophen]; Lipitor [atorvastatin]; Sulfa drugs cross reactors; Tramadol; Vicodin [hydrocodone-acetaminophen]; Penicillins; and Septra [sulfamethoxazole-trimethoprim]   Review of Systems Review of Systems  Constitutional: Negative.  Negative for chills and fever.  Eyes: Negative.  Negative for visual disturbance.  Respiratory: Negative.  Negative for cough and shortness of breath.   Cardiovascular: Negative.  Negative for chest pain.  Gastrointestinal: Negative.  Negative for abdominal pain, diarrhea, nausea and vomiting.  Musculoskeletal: Negative.  Negative for back pain, neck pain and neck stiffness.  Skin: Positive for wound (Right Elbow, occipital scalp).  Neurological: Negative.  Negative for dizziness, syncope, weakness, numbness and headaches.  All other systems reviewed and are negative.  Physical Exam Updated Vital Signs BP (!) 119/98   Pulse 64   Temp 98 F (36.7 C)   Resp 15   SpO2 98%   Physical Exam Constitutional:      General: She is not in acute distress.    Appearance: She is well-developed. She is not ill-appearing or diaphoretic.     Comments: Elderly and frail appearing  HENT:     Head: Normocephalic. Abrasion present. No raccoon eyes or Battle's sign.     Jaw: There is normal jaw occlusion. No trismus.      Comments: Small hematoma and abrasion to occipital scalp no active bleeding.  No repairable lesion.    Right Ear: Tympanic membrane, ear canal and external ear normal. No hemotympanum.     Left Ear: Tympanic membrane, ear canal and external ear normal. No hemotympanum.     Nose: Nose normal.     Mouth/Throat:     Lips: Pink.     Mouth: Mucous membranes are moist.     Pharynx: Oropharynx is clear. Uvula midline.  Eyes:     General: Vision grossly intact. Gaze aligned appropriately.     Extraocular Movements: Extraocular movements intact.     Conjunctiva/sclera: Conjunctivae normal.  Pupils: Pupils are equal, round, and reactive to light.    Neck:     Musculoskeletal: Full passive range of motion without pain, normal range of motion and neck supple. No spinous process tenderness or muscular tenderness.     Trachea: Trachea and phonation normal. No tracheal tenderness or tracheal deviation.  Cardiovascular:     Rate and Rhythm: Normal rate and regular rhythm.     Pulses:          Dorsalis pedis pulses are 2+ on the right side and 2+ on the left side.       Posterior tibial pulses are 2+ on the right side and 2+ on the left side.     Heart sounds: Normal heart sounds.  Pulmonary:     Effort: Pulmonary effort is normal. No respiratory distress.     Breath sounds: Normal breath sounds and air entry.  Chest:     Chest wall: No deformity, tenderness or crepitus.     Comments: No sign of injury to the chest Abdominal:     Palpations: Abdomen is soft.     Tenderness: There is no abdominal tenderness. There is no guarding or rebound.     Comments: No sign of injury to the abdomen  Musculoskeletal: Normal range of motion.     Comments: No midline C/T/L spinal tenderness to palpation, no paraspinal muscle tenderness, no deformity, crepitus, or step-off noted. No sign of injury to the neck or back.  Hips stable to compression bilaterally, patient able to bring knees towards chest without pain.  She is able to stand and ambulate with some assistance which is baseline.  2 cm laceration to the patient's right elbow, no active bleeding, small surrounding skin tear.  Full range of motion and appropriate strength for age of the right elbow.  Neurovascular intact distally.  Grip strength equal bilaterally.  Feet:     Right foot:     Protective Sensation: 3 sites tested. 3 sites sensed.     Left foot:     Protective Sensation: 3 sites tested. 3 sites sensed.  Skin:    General: Skin is warm and dry.  Neurological:     Mental Status: She is alert and oriented to person, place, and time. Mental status is at baseline.     GCS: GCS eye  subscore is 4. GCS verbal subscore is 5. GCS motor subscore is 6.     Comments: Speech is clear and goal oriented, follows commands Major Cranial nerves without deficit, no facial droop Normal strength in upper and lower extremities bilaterally including dorsiflexion and plantar flexion, strong and equal grip strength Sensation normal to light and sharp touch Moves extremities without ataxia, coordination intact Normal finger to nose and rapid alternating movements No pronator drift Gait with assistance is baseline per patient's daughter at bedside.  Psychiatric:        Behavior: Behavior normal.    ED Treatments / Results  Labs (all labs ordered are listed, but only abnormal results are displayed) Labs Reviewed - No data to display  EKG None  Radiology Ct Head Wo Contrast  Result Date: 02/01/2019 CLINICAL DATA:  Fall, posterior head hematoma. On blood thinners. Neck pain. History of hypertension, hyperlipidemia and pacemaker. EXAM: CT HEAD WITHOUT CONTRAST CT CERVICAL SPINE WITHOUT CONTRAST TECHNIQUE: Multidetector CT imaging of the head and cervical spine was performed following the standard protocol without intravenous contrast. Multiplanar CT image reconstructions of the cervical spine were also generated. COMPARISON:  CT HEAD  November 15, 2018 and CT cervical spine October 11, 2018. FINDINGS: CT HEAD FINDINGS BRAIN: No intraparenchymal hemorrhage, mass effect nor midline shift. No parenchymal brain volume loss for age. No hydrocephalus. Patchy supratentorial white matter hypodensities. Old small LEFT cerebellar infarct. No acute large vascular territory infarcts. No abnormal extra-axial fluid collections. Basal cisterns are patent. VASCULAR: Severe calcific atherosclerosis of the carotid siphons and included vertebral arteries. SKULL: No skull fracture. Moderate RIGHT parietooccipital scalp hematoma without subcutaneous gas or radiopaque foreign bodies. SINUSES/ORBITS: Mild paranasal  sinus mucosal thickening. Atretic RIGHT sphenoid sinus. Mastoid air cells are well aerated. Mastoid air cells are well aerated.The included ocular globes and orbital contents are non-suspicious. Status post bilateral ocular lens implants. OTHER: None. CT CERVICAL SPINE FINDINGS ALIGNMENT: Maintained lordosis. Stable grade 1 C3-4 and C7-T1 anterolisthesis. SKULL BASE AND VERTEBRAE: Cervical vertebral bodies and posterior elements are intact. Moderate to severe C5-6 and C6-7 disc height loss with endplate spurring compatible with degenerative discs, mild at C3-4 and C4-5. C1-2 articulation maintained with severe arthropathy. Small amount of calcified pannus about the odontoid process. SOFT TISSUES AND SPINAL CANAL: Nonacute. Mild RIGHT and moderate LEFT carotid bifurcation calcific atherosclerosis. DISC LEVELS: Severe bilateral C3-4 and C5-6 neural foraminal narrowing. Moderate at C4-5. No canal stenosis. UPPER CHEST: Lung apices are clear. OTHER: None. IMPRESSION: CT HEAD: 1. No acute intracranial process. Moderate posterior RIGHT scalp hematoma. 2. Stable examination including moderate chronic small vessel ischemic changes and old small LEFT cerebellar infarct. CT CERVICAL SPINE: 1. No fracture. Grade 1 C3-4 and C7-T1 stable anterolisthesis on a degenerative basis. 2. Severe C3-4 and C5-6 neural foraminal narrowing. Electronically Signed   By: Elon Alas M.D.   On: 02/01/2019 06:08   Ct Cervical Spine Wo Contrast  Result Date: 02/01/2019 CLINICAL DATA:  Fall, posterior head hematoma. On blood thinners. Neck pain. History of hypertension, hyperlipidemia and pacemaker. EXAM: CT HEAD WITHOUT CONTRAST CT CERVICAL SPINE WITHOUT CONTRAST TECHNIQUE: Multidetector CT imaging of the head and cervical spine was performed following the standard protocol without intravenous contrast. Multiplanar CT image reconstructions of the cervical spine were also generated. COMPARISON:  CT HEAD November 15, 2018 and CT cervical  spine October 11, 2018. FINDINGS: CT HEAD FINDINGS BRAIN: No intraparenchymal hemorrhage, mass effect nor midline shift. No parenchymal brain volume loss for age. No hydrocephalus. Patchy supratentorial white matter hypodensities. Old small LEFT cerebellar infarct. No acute large vascular territory infarcts. No abnormal extra-axial fluid collections. Basal cisterns are patent. VASCULAR: Severe calcific atherosclerosis of the carotid siphons and included vertebral arteries. SKULL: No skull fracture. Moderate RIGHT parietooccipital scalp hematoma without subcutaneous gas or radiopaque foreign bodies. SINUSES/ORBITS: Mild paranasal sinus mucosal thickening. Atretic RIGHT sphenoid sinus. Mastoid air cells are well aerated. Mastoid air cells are well aerated.The included ocular globes and orbital contents are non-suspicious. Status post bilateral ocular lens implants. OTHER: None. CT CERVICAL SPINE FINDINGS ALIGNMENT: Maintained lordosis. Stable grade 1 C3-4 and C7-T1 anterolisthesis. SKULL BASE AND VERTEBRAE: Cervical vertebral bodies and posterior elements are intact. Moderate to severe C5-6 and C6-7 disc height loss with endplate spurring compatible with degenerative discs, mild at C3-4 and C4-5. C1-2 articulation maintained with severe arthropathy. Small amount of calcified pannus about the odontoid process. SOFT TISSUES AND SPINAL CANAL: Nonacute. Mild RIGHT and moderate LEFT carotid bifurcation calcific atherosclerosis. DISC LEVELS: Severe bilateral C3-4 and C5-6 neural foraminal narrowing. Moderate at C4-5. No canal stenosis. UPPER CHEST: Lung apices are clear. OTHER: None. IMPRESSION: CT HEAD: 1. No acute intracranial process. Moderate  posterior RIGHT scalp hematoma. 2. Stable examination including moderate chronic small vessel ischemic changes and old small LEFT cerebellar infarct. CT CERVICAL SPINE: 1. No fracture. Grade 1 C3-4 and C7-T1 stable anterolisthesis on a degenerative basis. 2. Severe C3-4 and C5-6  neural foraminal narrowing. Electronically Signed   By: Elon Alas M.D.   On: 02/01/2019 06:08    Procedures .Marland KitchenLaceration Repair Date/Time: 02/01/2019 9:19 AM Performed by: Deliah Boston, PA-C Authorized by: Deliah Boston, PA-C   Consent:    Consent obtained:  Verbal   Consent given by:  Patient (And Daugther at bedside)   Risks discussed:  Infection, pain, retained foreign body, tendon damage, poor wound healing, poor cosmetic result, need for additional repair, nerve damage and vascular damage Anesthesia (see MAR for exact dosages):    Anesthesia method:  Topical application   Topical anesthetic:  LET Laceration details:    Location: Right Elbow.   Length (cm):  2   Depth (mm):  4 Repair type:    Repair type:  Simple Pre-procedure details:    Preparation:  Patient was prepped and draped in usual sterile fashion Exploration:    Wound exploration: wound explored through full range of motion and entire depth of wound probed and visualized     Wound extent: no foreign bodies/material noted, no muscle damage noted, no nerve damage noted, no tendon damage noted, no underlying fracture noted and no vascular damage noted     Contaminated: no   Treatment:    Area cleansed with:  Betadine and saline   Amount of cleaning:  Standard   Irrigation solution:  Sterile saline   Irrigation volume:  532ml   Irrigation method:  Pressure wash Skin repair:    Repair method:  Sutures   Suture size:  4-0   Suture material:  Prolene   Suture technique:  Simple interrupted   Number of sutures:  2 Approximation:    Approximation:  Close Post-procedure details:    Dressing:  Non-adherent dressing, antibiotic ointment and sterile dressing   Patient tolerance of procedure:  Tolerated well, no immediate complications   (including critical care time)  Medications Ordered in ED Medications  bacitracin ointment (has no administration in time range)  lidocaine (PF) (XYLOCAINE) 1 %  injection 5 mL (5 mLs Infiltration Given by Other 02/01/19 0747)  lidocaine-EPINEPHrine-tetracaine (LET) solution (3 mLs Topical Given 02/01/19 0747)  Tdap (BOOSTRIX) injection 0.5 mL (0.5 mLs Intramuscular Given 02/01/19 0746)     Initial Impression / Assessment and Plan / ED Course  I have reviewed the triage vital signs and the nursing notes.  Pertinent labs & imaging results that were available during my care of the patient were reviewed by me and considered in my medical decision making (see chart for details).    85 year old female presenting from nursing facility following fall that occurred, unwitnessed.  She denies syncope or loss of consciousness today.  Patient with abrasion to the occipital scalp with hematoma as well as small laceration/skin tear of the right elbow.  CT head and cervical spine without acute findings.  Small hematoma and abrasion to occipital scalp cleaned and dressed by nursing staff.  Patient's right elbow thoroughly cleaned and repaired as above, no antibiotics indicated at this time.  Patient's Tdap was updated today.  Patient's daughter showed up midway through visit and I have discussed incidental CT scan findings with her and encouraged PCP follow-up.  Patient is at baseline per daughter, she has remained alert and oriented  for me throughout this visit. Patient without signs of serious head, neck or back injury, no midline spinal tenderness or tenderness to palpation of the chest or abdomen, hips stable and moving all extremities spontaneously and is able to stand and ambulate with some assistance.  Patient with appropriate strength and range of motion of all major joints and no additional injuries found today.  Patient states that she is feeling well and is without additional complaint and wishes to return to back to her facility at this time.  AVS was printed with return precautions, wound care instructions as well as suture removal time to be brought back to  facility.  At this time there does not appear to be any evidence of an acute emergency medical condition and the patient appears stable for discharge with appropriate outpatient follow up. Diagnosis was discussed with patient and daughter who verbalizes understanding of care plan and is agreeable to discharge. I have discussed return precautions with patient and daughter who verbalize understanding of return precautions. Patient and daughter strongly encouraged to follow-up with their PCP this week. All questions answered.  Patient was seen and evaluated by Dr. Tomi Bamberger during this visit who agrees with laceration repair and discharged back to facility.  Note: Portions of this report may have been transcribed using voice recognition software. Every effort was made to ensure accuracy; however, inadvertent computerized transcription errors may still be present. Final Clinical Impressions(s) / ED Diagnoses   Final diagnoses:  Fall, initial encounter  Injury of head, initial encounter  Laceration of right elbow, initial encounter    ED Discharge Orders    None       Gari Crown 02/01/19 1056    Rolland Porter, MD 02/06/19 1339

## 2019-02-01 NOTE — ED Triage Notes (Signed)
Pt from [facility] for unwitnessed fall striking back of head. EMS notes hematoma to Posterior head. C-collar in place to maintain neck alignment. -blood thinners. Noted skin tear to R elbow

## 2019-02-01 NOTE — ED Provider Notes (Addendum)
Elderly patient presents from her nursing facility after a unwitnessed fall.    When I talked to the patient she is only wanting to talk about how uncomfortable the cervical collar is and she wants it off.  She is noted to have a superficial abrasion of her right posterior scalp. She is moving her arms well.  She has a small laceration of her right elbow.  It is not actively bleeding.  Medical screening examination/treatment/procedure(s) were conducted as a shared visit with non-physician practitioner(s) and myself.  I personally evaluated the patient during the encounter.  None   Rolland Porter, MD, Barbette Or, MD 02/01/19 [id number]    Rolland Porter, MD 02/01/19 [phone]

## 2019-02-01 NOTE — Discharge Instructions (Addendum)
You have been diagnosed today with Fall resulting in head injury and laceration/skin tear of right elbow.   At this time there does not appear to be the presence of an emergent medical condition, however there is always the potential for conditions to change. Please read and follow the below instructions.  Please return to the Emergency Department immediately for any new or worsening symptoms. Please be sure to follow up with your Primary Care Provider this week regarding your visit today; please call their office to schedule an appointment even if you are feeling better for a follow-up visit. Your 2 sutures will need to be removed from your right elbow in 7 days.  This can be done by your primary care provider, and urgent care or here to the emergency department. Please keep the laceration on the right elbow and abrasion to the back of the head clean and covered with a sterile dressing.  Monitor for signs of infection. There are chronic changes of the cervical spine including anterior listhesis and neural foraminal narrowing, please discuss this with the patient's primary care provider at her next visit.  Additionally an old stroke is seen on the CT scan, discussed this with the patient primary care provider as well.  Get help right away if: You have very bad swelling around your wound. You have pus or a bad smell coming from your wound. Your pain suddenly gets worse and is very bad. You have painful lumps near your wound or anywhere on your body. You have a red streak going away from your wound. The wound is on your hand or foot, and: You cannot move a finger or toe as you used to do. Your fingers or toes look pale or blue. You have numbness that spreads down your hand, foot, fingers, or toes. Get help right away if: You have: A very bad (severe) headache that is not helped by medicine. Trouble walking or weakness in your arms and legs. Clear or bloody fluid coming from your nose or  ears. Changes in your seeing (vision). Jerky movements that you cannot control (seizure). You throw up (vomit). Your symptoms get worse. You lose balance. Your speech is slurred. You pass out. You are sleepier and have trouble staying awake. The black centers of your eyes (pupils) change in size.  Please read the additional information packets attached to your discharge summary.

## 2019-08-13 ENCOUNTER — Telehealth: Payer: Self-pay

## 2019-08-13 NOTE — Telephone Encounter (Signed)
Attempted to call pt to schedule follow up, line disconnected

## 2020-06-13 ENCOUNTER — Emergency Department (HOSPITAL_COMMUNITY): Payer: Medicare Other

## 2020-06-13 ENCOUNTER — Encounter (HOSPITAL_COMMUNITY): Payer: Self-pay | Admitting: *Deleted

## 2020-06-13 ENCOUNTER — Other Ambulatory Visit: Payer: Self-pay

## 2020-06-13 ENCOUNTER — Inpatient Hospital Stay (HOSPITAL_COMMUNITY)
Admission: EM | Admit: 2020-06-13 | Discharge: 2020-06-21 | DRG: 689 | Disposition: A | Discharge status: Skilled Nursing Facility | Payer: Medicare Other | Attending: Internal Medicine | Admitting: Internal Medicine

## 2020-06-13 DIAGNOSIS — Z515 Encounter for palliative care: Secondary | ICD-10-CM | POA: Diagnosis present

## 2020-06-13 DIAGNOSIS — L8990 Pressure ulcer of unspecified site, unspecified stage: Secondary | ICD-10-CM | POA: Insufficient documentation

## 2020-06-13 DIAGNOSIS — Z885 Allergy status to narcotic agent status: Secondary | ICD-10-CM

## 2020-06-13 DIAGNOSIS — E861 Hypovolemia: Secondary | ICD-10-CM | POA: Diagnosis present

## 2020-06-13 DIAGNOSIS — R4182 Altered mental status, unspecified: Secondary | ICD-10-CM

## 2020-06-13 DIAGNOSIS — N1832 Chronic kidney disease, stage 3b: Secondary | ICD-10-CM | POA: Diagnosis not present

## 2020-06-13 DIAGNOSIS — E1122 Type 2 diabetes mellitus with diabetic chronic kidney disease: Secondary | ICD-10-CM | POA: Diagnosis present

## 2020-06-13 DIAGNOSIS — E870 Hyperosmolality and hypernatremia: Secondary | ICD-10-CM | POA: Diagnosis not present

## 2020-06-13 DIAGNOSIS — Z79899 Other long term (current) drug therapy: Secondary | ICD-10-CM

## 2020-06-13 DIAGNOSIS — Z7982 Long term (current) use of aspirin: Secondary | ICD-10-CM

## 2020-06-13 DIAGNOSIS — Z90722 Acquired absence of ovaries, bilateral: Secondary | ICD-10-CM

## 2020-06-13 DIAGNOSIS — I130 Hypertensive heart and chronic kidney disease with heart failure and stage 1 through stage 4 chronic kidney disease, or unspecified chronic kidney disease: Secondary | ICD-10-CM | POA: Diagnosis present

## 2020-06-13 DIAGNOSIS — R64 Cachexia: Secondary | ICD-10-CM | POA: Diagnosis present

## 2020-06-13 DIAGNOSIS — N390 Urinary tract infection, site not specified: Secondary | ICD-10-CM | POA: Diagnosis not present

## 2020-06-13 DIAGNOSIS — Z880 Allergy status to penicillin: Secondary | ICD-10-CM

## 2020-06-13 DIAGNOSIS — Z9079 Acquired absence of other genital organ(s): Secondary | ICD-10-CM

## 2020-06-13 DIAGNOSIS — I5042 Chronic combined systolic (congestive) and diastolic (congestive) heart failure: Secondary | ICD-10-CM | POA: Diagnosis present

## 2020-06-13 DIAGNOSIS — Z882 Allergy status to sulfonamides status: Secondary | ICD-10-CM

## 2020-06-13 DIAGNOSIS — E785 Hyperlipidemia, unspecified: Secondary | ICD-10-CM | POA: Diagnosis present

## 2020-06-13 DIAGNOSIS — E1121 Type 2 diabetes mellitus with diabetic nephropathy: Secondary | ICD-10-CM

## 2020-06-13 DIAGNOSIS — G9341 Metabolic encephalopathy: Secondary | ICD-10-CM | POA: Diagnosis present

## 2020-06-13 DIAGNOSIS — Z66 Do not resuscitate: Secondary | ICD-10-CM | POA: Diagnosis present

## 2020-06-13 DIAGNOSIS — Z681 Body mass index (BMI) 19 or less, adult: Secondary | ICD-10-CM

## 2020-06-13 DIAGNOSIS — Z79891 Long term (current) use of opiate analgesic: Secondary | ICD-10-CM

## 2020-06-13 DIAGNOSIS — I255 Ischemic cardiomyopathy: Secondary | ICD-10-CM | POA: Diagnosis present

## 2020-06-13 DIAGNOSIS — Z96641 Presence of right artificial hip joint: Secondary | ICD-10-CM | POA: Diagnosis present

## 2020-06-13 DIAGNOSIS — R627 Adult failure to thrive: Secondary | ICD-10-CM | POA: Diagnosis present

## 2020-06-13 DIAGNOSIS — R296 Repeated falls: Secondary | ICD-10-CM | POA: Diagnosis present

## 2020-06-13 DIAGNOSIS — M810 Age-related osteoporosis without current pathological fracture: Secondary | ICD-10-CM | POA: Diagnosis present

## 2020-06-13 DIAGNOSIS — E43 Unspecified severe protein-calorie malnutrition: Secondary | ICD-10-CM | POA: Diagnosis present

## 2020-06-13 DIAGNOSIS — Z950 Presence of cardiac pacemaker: Secondary | ICD-10-CM

## 2020-06-13 DIAGNOSIS — Z951 Presence of aortocoronary bypass graft: Secondary | ICD-10-CM

## 2020-06-13 DIAGNOSIS — I272 Pulmonary hypertension, unspecified: Secondary | ICD-10-CM | POA: Diagnosis present

## 2020-06-13 DIAGNOSIS — N183 Chronic kidney disease, stage 3 unspecified: Secondary | ICD-10-CM | POA: Diagnosis present

## 2020-06-13 DIAGNOSIS — Z85828 Personal history of other malignant neoplasm of skin: Secondary | ICD-10-CM

## 2020-06-13 DIAGNOSIS — R1310 Dysphagia, unspecified: Secondary | ICD-10-CM | POA: Diagnosis present

## 2020-06-13 DIAGNOSIS — Z8673 Personal history of transient ischemic attack (TIA), and cerebral infarction without residual deficits: Secondary | ICD-10-CM

## 2020-06-13 DIAGNOSIS — J189 Pneumonia, unspecified organism: Secondary | ICD-10-CM | POA: Diagnosis present

## 2020-06-13 DIAGNOSIS — Z7401 Bed confinement status: Secondary | ICD-10-CM

## 2020-06-13 DIAGNOSIS — L89152 Pressure ulcer of sacral region, stage 2: Secondary | ICD-10-CM | POA: Diagnosis present

## 2020-06-13 DIAGNOSIS — Z952 Presence of prosthetic heart valve: Secondary | ICD-10-CM

## 2020-06-13 DIAGNOSIS — N3281 Overactive bladder: Secondary | ICD-10-CM | POA: Diagnosis present

## 2020-06-13 DIAGNOSIS — Z888 Allergy status to other drugs, medicaments and biological substances status: Secondary | ICD-10-CM

## 2020-06-13 DIAGNOSIS — I480 Paroxysmal atrial fibrillation: Secondary | ICD-10-CM | POA: Diagnosis present

## 2020-06-13 DIAGNOSIS — Z794 Long term (current) use of insulin: Secondary | ICD-10-CM

## 2020-06-13 DIAGNOSIS — E876 Hypokalemia: Secondary | ICD-10-CM | POA: Diagnosis present

## 2020-06-13 DIAGNOSIS — I2510 Atherosclerotic heart disease of native coronary artery without angina pectoris: Secondary | ICD-10-CM | POA: Diagnosis present

## 2020-06-13 DIAGNOSIS — I442 Atrioventricular block, complete: Secondary | ICD-10-CM | POA: Diagnosis present

## 2020-06-13 DIAGNOSIS — F0390 Unspecified dementia without behavioral disturbance: Secondary | ICD-10-CM | POA: Diagnosis present

## 2020-06-13 DIAGNOSIS — G9340 Encephalopathy, unspecified: Secondary | ICD-10-CM | POA: Diagnosis not present

## 2020-06-13 DIAGNOSIS — Z20822 Contact with and (suspected) exposure to covid-19: Secondary | ICD-10-CM | POA: Diagnosis present

## 2020-06-13 DIAGNOSIS — Z8249 Family history of ischemic heart disease and other diseases of the circulatory system: Secondary | ICD-10-CM

## 2020-06-13 DIAGNOSIS — E1129 Type 2 diabetes mellitus with other diabetic kidney complication: Secondary | ICD-10-CM | POA: Diagnosis present

## 2020-06-13 DIAGNOSIS — J690 Pneumonitis due to inhalation of food and vomit: Secondary | ICD-10-CM | POA: Diagnosis present

## 2020-06-13 LAB — CBC WITH DIFFERENTIAL/PLATELET
Abs Immature Granulocytes: 0.09 10*3/uL — ABNORMAL HIGH (ref 0.00–0.07)
Basophils Absolute: 0 10*3/uL (ref 0.0–0.1)
Basophils Relative: 0 %
Eosinophils Absolute: 0 10*3/uL (ref 0.0–0.5)
Eosinophils Relative: 0 %
HCT: 45.6 % (ref 36.0–46.0)
Hemoglobin: 13.7 g/dL (ref 12.0–15.0)
Immature Granulocytes: 1 %
Lymphocytes Relative: 10 %
Lymphs Abs: 0.9 10*3/uL (ref 0.7–4.0)
MCH: 33.1 pg (ref 26.0–34.0)
MCHC: 30 g/dL (ref 30.0–36.0)
MCV: 110.1 fL — ABNORMAL HIGH (ref 80.0–100.0)
Monocytes Absolute: 1.2 10*3/uL — ABNORMAL HIGH (ref 0.1–1.0)
Monocytes Relative: 14 %
Neutro Abs: 6.8 10*3/uL (ref 1.7–7.7)
Neutrophils Relative %: 75 %
Platelets: DECREASED 10*3/uL (ref 150–400)
RBC: 4.14 MIL/uL (ref 3.87–5.11)
RDW: 18.2 % — ABNORMAL HIGH (ref 11.5–15.5)
WBC: 9.1 10*3/uL (ref 4.0–10.5)
nRBC: 0.7 % — ABNORMAL HIGH (ref 0.0–0.2)

## 2020-06-13 LAB — BASIC METABOLIC PANEL
Anion gap: 14 (ref 5–15)
BUN: 46 mg/dL — ABNORMAL HIGH (ref 8–23)
CO2: 23 mmol/L (ref 22–32)
Calcium: 9 mg/dL (ref 8.9–10.3)
Chloride: 122 mmol/L — ABNORMAL HIGH (ref 98–111)
Creatinine, Ser: 1.24 mg/dL — ABNORMAL HIGH (ref 0.44–1.00)
GFR calc Af Amer: 44 mL/min — ABNORMAL LOW (ref >60)
GFR calc non Af Amer: 38 mL/min — ABNORMAL LOW (ref >60)
Glucose, Bld: 180 mg/dL — ABNORMAL HIGH (ref 70–99)
Potassium: 3.4 mmol/L — ABNORMAL LOW (ref 3.5–5.1)
Sodium: 159 mmol/L — ABNORMAL HIGH (ref 135–145)

## 2020-06-13 LAB — SARS CORONAVIRUS 2 BY RT PCR (HOSPITAL ORDER, PERFORMED IN ~~LOC~~ HOSPITAL LAB): SARS Coronavirus 2: NEGATIVE

## 2020-06-13 LAB — CBG MONITORING, ED: Glucose-Capillary: 138 mg/dL — ABNORMAL HIGH (ref 70–99)

## 2020-06-13 MED ORDER — METOPROLOL SUCCINATE ER 25 MG PO TB24: 12.5000 mg | ORAL_TABLET | Freq: Every day | ORAL | Status: DC

## 2020-06-13 MED ORDER — DIVALPROEX SODIUM 125 MG PO CSDR: 375.0000 mg | DELAYED_RELEASE_CAPSULE | Freq: Three times a day (TID) | ORAL | Status: DC

## 2020-06-13 MED ORDER — SODIUM CHLORIDE 0.9 % IV SOLN: 250.0000 mL | INTRAVENOUS | Status: DC | PRN

## 2020-06-13 MED ORDER — POTASSIUM CHLORIDE 10 MEQ/100ML IV SOLN
10.0000 meq | INTRAVENOUS | Status: AC
  Administered 2020-06-14 (×2): 10 meq via INTRAVENOUS
  Filled 2020-06-13 (×2): qty 100

## 2020-06-13 MED ORDER — SODIUM CHLORIDE 0.9% FLUSH
3.0000 mL | Freq: Two times a day (BID) | INTRAVENOUS | Status: DC
  Administered 2020-06-14 – 2020-06-21 (×14): 3 mL via INTRAVENOUS

## 2020-06-13 MED ORDER — SACUBITRIL-VALSARTAN 49-51 MG PO TABS
1.0000 | ORAL_TABLET | Freq: Two times a day (BID) | ORAL | Status: DC
  Filled 2020-06-13 (×5): qty 1

## 2020-06-13 MED ORDER — INSULIN ASPART 100 UNIT/ML ~~LOC~~ SOLN
0.0000 [IU] | Freq: Three times a day (TID) | SUBCUTANEOUS | Status: DC
  Administered 2020-06-14 – 2020-06-15 (×5): 1 [IU] via SUBCUTANEOUS
  Administered 2020-06-16: 2 [IU] via SUBCUTANEOUS
  Administered 2020-06-16: 1 [IU] via SUBCUTANEOUS
  Administered 2020-06-16: 2 [IU] via SUBCUTANEOUS
  Administered 2020-06-17 (×2): 1 [IU] via SUBCUTANEOUS
  Administered 2020-06-18 (×3): 2 [IU] via SUBCUTANEOUS
  Administered 2020-06-18: 3 [IU] via SUBCUTANEOUS
  Administered 2020-06-19 – 2020-06-20 (×3): 2 [IU] via SUBCUTANEOUS
  Administered 2020-06-20: 3 [IU] via SUBCUTANEOUS
  Administered 2020-06-20: 1 [IU] via SUBCUTANEOUS
  Administered 2020-06-21: 2 [IU] via SUBCUTANEOUS

## 2020-06-13 MED ORDER — ONDANSETRON HCL 4 MG/2ML IJ SOLN: 4.0000 mg | Freq: Four times a day (QID) | INTRAMUSCULAR | Status: DC | PRN

## 2020-06-13 MED ORDER — SODIUM CHLORIDE 0.9 % IV SOLN
500.0000 mg | INTRAVENOUS | Status: DC
  Filled 2020-06-13: qty 500

## 2020-06-13 MED ORDER — SODIUM CHLORIDE 0.9 % IV SOLN
500.0000 mg | Freq: Once | INTRAVENOUS | Status: AC
  Administered 2020-06-13: 500 mg via INTRAVENOUS
  Filled 2020-06-13: qty 500

## 2020-06-13 MED ORDER — FAMOTIDINE 20 MG PO TABS: 20.0000 mg | ORAL_TABLET | Freq: Every day | ORAL | Status: DC

## 2020-06-13 MED ORDER — ONDANSETRON HCL 4 MG PO TABS: 4.0000 mg | ORAL_TABLET | Freq: Four times a day (QID) | ORAL | Status: DC | PRN

## 2020-06-13 MED ORDER — ACETAMINOPHEN 325 MG PO TABS: 650.0000 mg | ORAL_TABLET | Freq: Four times a day (QID) | ORAL | Status: DC | PRN

## 2020-06-13 MED ORDER — LACTATED RINGERS IV BOLUS
1000.0000 mL | Freq: Once | INTRAVENOUS | Status: AC
  Administered 2020-06-13: 1000 mL via INTRAVENOUS

## 2020-06-13 MED ORDER — HYDRALAZINE HCL 10 MG PO TABS: 10.0000 mg | ORAL_TABLET | Freq: Three times a day (TID) | ORAL | Status: DC

## 2020-06-13 MED ORDER — SODIUM CHLORIDE 0.9% FLUSH: 3.0000 mL | INTRAVENOUS | Status: DC | PRN

## 2020-06-13 MED ORDER — SODIUM CHLORIDE 0.9 % IV SOLN
1.0000 g | Freq: Once | INTRAVENOUS | Status: AC
  Administered 2020-06-13: 1 g via INTRAVENOUS
  Filled 2020-06-13: qty 10

## 2020-06-13 MED ORDER — ACETAMINOPHEN 650 MG RE SUPP: 650.0000 mg | Freq: Four times a day (QID) | RECTAL | Status: DC | PRN

## 2020-06-13 MED ORDER — ASPIRIN 81 MG PO CHEW: 81.0000 mg | CHEWABLE_TABLET | Freq: Every day | ORAL | Status: DC

## 2020-06-13 MED ORDER — SODIUM CHLORIDE 0.9 % IV SOLN
2.0000 g | INTRAVENOUS | Status: DC
  Administered 2020-06-14 – 2020-06-16 (×3): 2 g via INTRAVENOUS
  Filled 2020-06-13 (×3): qty 20

## 2020-06-13 MED ORDER — LIDOCAINE 5 % EX PTCH
2.0000 | MEDICATED_PATCH | Freq: Every day | CUTANEOUS | Status: DC
  Administered 2020-06-14 – 2020-06-21 (×8): 2 via TRANSDERMAL
  Filled 2020-06-13 (×8): qty 2

## 2020-06-13 MED ORDER — SENNOSIDES-DOCUSATE SODIUM 8.6-50 MG PO TABS: 1.0000 | ORAL_TABLET | Freq: Every evening | ORAL | Status: DC | PRN

## 2020-06-13 NOTE — ED Triage Notes (Signed)
Patient presents to ed via [facility] from [facility] states patient is normally non=ambulatory, hx. Of advanced dementia, daughter is at bedside. States she spoke with patient on the phone last Tues. And patient was verbal however hasn't been since. Patient is alert however currently non-verbal. Ems states staff at [facility] states patient appetite has decreased over the past 3 days to no intake, patient arrives with a PICC line in her left upper arm of which she is getting antibiotics for UTI.

## 2020-06-13 NOTE — ED Provider Notes (Signed)
[facility] EMERGENCY DEPARTMENT Provider Note   CSN: [id number] Arrival date & time: 06/13/20  1651     History Chief Complaint  Patient presents with   Altered Mental Status   Failure To [name] is a 86 y.o. female.  Her note from patient's facility she has severe dementia, nonambulatory nonverbal with PICC line for UTI who has had decreased no appetite for the last 3 days.  Patient is a ward of the state.  Patient is DNR.  Attempting to get collateral from facility, both nursing and I were unable to contact facility after multiple phone calls.  Social work was able to can contact the patient's guardian and they stated if the patient has no treatable disease process they are comfortable going comfort care.  Patient's daughter is at bedside, she does not have medical power of attorney and states that her mother was better may be a week to 10 days ago.  The history is provided by medical records. The history is limited by the condition of the patient.  Illness Location:  Constitutional Quality:  Not eating for the last 3 days Severity:  Unable to specify Onset quality:  Gradual Timing:  Constant Progression:  Unable to specify Chronicity:  New Context:  Patient has significant disability from her dementia Relieved by:  Nothing tried Worsened by:  Nothing Ineffective treatments:  None tried      Past Medical History:  Diagnosis Date   Arthritis    "left wrist; back" (07/29/2013)   Cancer ([place]) Sept. 2015   Left Hand  -  Skin Cancer   Carotid stenosis    occluded right intracranial ICA followed by Dr. Donnetta Hutching.  1-39% left ICA stenosis by dopplers 10/2017   Chronic combined systolic and diastolic CHF (congestive heart failure) ([place])    Chronic kidney disease, unspecified    Complete heart block ([place]) 07/29/2013   a. s/p MDT dual chamber PPM   Coronary artery disease    s/p CABG   Hyperlipidemia    Hypertension    Ischemic  cardiomyopathy     s/p MDT single chamber ICD 2006.  EF improved and device was downgraded to dual chamber PPM 2014.  EF normalized by echo 2014 and at time of battery changeout ICD changed to PPM.  EF then decreased to 20-25% by echo 04/2016.  Seen in AHF clinic and plan was for not pursue cath given her lack of sx and advanced age. Her Delene Loll was increased and it was decided that she was too high risk for CRTP    Osteoporosis    Overactive bladder    Pacemaker    Postoperative atrial fibrillation ([place])    Pulmonary HTN ([place])    severe by echo 04/2016 - likely Group 2 from pulmonary venous HTN secondary to LV dysfunction and MR   Severe mitral regurgitation    s/p MV ring.  Moderate MR and mild mitral stenosis on echo 04/2016   Stroke ([place]) 07/2005   Type II diabetes mellitus [facility])     Patient Active Problem List   Diagnosis Date Noted   Encephalopathy 06/14/2020   Pressure injury of skin 06/14/2020   Acute encephalopathy 06/13/2020   Hypernatremia    Hypokalemia    Multifocal pneumonia    Status post mitral valve repair 09/22/2018   S/P CABG (coronary artery bypass graft) 09/22/2018   Status post placement of cardiac pacemaker 09/22/2018   Closed left hip fracture ([place]) 08/19/2018  Macrocytic anemia 08/19/2018   DCM (dilated cardiomyopathy) ([place]) 02/20/2018   Heart murmur 05/14/2016   Fall 11/17/2015   Scalp laceration 11/17/2015   Facial contusion 11/17/2015   Rib fracture 11/16/2015   Paresthesia of both hands 01/18/2014   Renal insufficiency 01/18/2014   Lower extremity weakness 01/18/2014   Coronary artery disease    Type II diabetes mellitus with renal manifestations (H[facility])    Hyperlipidemia    Carotid stenosis    Complete heart block ([facility])    Severe mitral regurgitation    Chronic combined systolic and diastolic CHF (congestive heart failure) ([facility])    PAF (paroxysmal atrial fibrillation) ([place]) 10/29/2013   6949 lead 07/21/2013     CKD (chronic kidney disease), stage III ([place])    Hypertension 04/30/2011    Past Surgical History:  Procedure Laterality Date   [place]  11/2005   by Dr Leonia Reeves (MDT) she has a 6949 fidelis lead   CATARACT EXTRACTION W/ INTRAOCULAR LENS  IMPLANT, BILATERAL Bilateral 1980's   CORONARY ARTERY BYPASS GRAFT  07/2005   "CABG X3" (07/29/2013)   FEMUR IM NAIL Left 08/20/2018   Procedure: INTRAMEDULLARY (IM) NAIL FEMORAL;  Surgeon: Frederik Pear, MD;  Location: [facility];  Service: Orthopedics;  Laterality: Left;   MITRAL VALVE REPLACEMENT  07/2005   PERMANENT PACEMAKER INSERTION N/A 07/29/2013   upgrade of single chamber ICD to Medtronic Adapta L DR implanted with new RA and RV leads inserted   SALPINGOOPHORECTOMY Bilateral 1976   SHOULDER ARTHROSCOPY W/ ROTATOR CUFF REPAIR Right 1998   TOTAL HIP ARTHROPLASTY Right 2001     OB History   No obstetric history on file.     Family History  Problem Relation Age of Onset   Cancer Mother        Theadora Rama    Heart disease Mother        Before age 49   Heart disease Sister        Before age 64-  CABG   Cancer Sister        Uterain   Diabetes Sister    Hyperlipidemia Sister    Heart attack Sister    Diabetes Daughter    Coronary artery disease Other        family hx of   Hypertension Other        family hx of   Diabetes Other        family hx of    Social History   Tobacco Use   Smoking status: Never Smoker   Smokeless tobacco: Never Used  Vaping Use   Vaping Use: Never used  Substance Use Topics   Alcohol use: No    Alcohol/week: 0.0 standard drinks   Drug use: No    Home Medications Prior to Admission medications   Medication Sig Start Date End Date Taking? Authorizing Provider  acetaminophen (TYLENOL) 500 MG tablet Take 1,000 mg by mouth 3 (three) times daily.    Yes [provider]  aspirin 81 MG chewable tablet Chew 81 mg by mouth daily.    Yes [provider]  Cholecalciferol (VITAMIN D) 2000 UNITS tablet Take 2,000 Units by mouth daily.    Yes [provider]  divalproex (DEPAKOTE SPRINKLE) 125 MG capsule Take 375 mg by mouth 3 (three) times daily.   Yes [provider]  famotidine (PEPCID) 20 MG tablet Take 20 mg by mouth daily.   Yes [provider]  fish oil-omega-3 fatty  acids 1000 MG capsule Take 1 g by mouth 2 (two) times daily.    Yes [provider]  hydrALAZINE (APRESOLINE) 10 MG tablet Take 10 mg by mouth 3 (three) times daily.   Yes [provider]  Lidocaine (ASPERCREME LIDOCAINE) 4 % PTCH Apply 1 patch topically in the morning and at bedtime. To both shoulders   Yes [provider]  Menthol, Topical Analgesic, (BIOFREEZE) 4 % GEL Apply topically in the morning and at bedtime. To bilateral shoulder, right wrist and left knee   Yes [provider]  metoprolol succinate (TOPROL-XL) 25 MG 24 hr tablet Take 12.5 mg by mouth daily.   Yes [provider]  nitroGLYCERIN (NITROSTAT) 0.4 MG SL tablet Place 0.4 mg under the tongue every 5 (five) minutes as needed for chest pain (max 3 doses/ recheck vital signs 30 minutes after admnistering).   Yes [provider]  oxybutynin (DITROPAN-XL) 5 MG 24 hr tablet Take 5 mg by mouth at bedtime.   Yes [provider]  polyethylene glycol (MIRALAX / GLYCOLAX) packet Take 17 g by mouth daily. 08/26/18  Yes Georgette Shell, MD  sacubitril-valsartan (ENTRESTO) 49-51 MG Take 1 tablet by mouth 2 (two) times daily. 07/07/18  Yes Larey Dresser, MD  senna-docusate (SENNA-S) 8.6-50 MG tablet Take 2 tablets by mouth at bedtime.    Yes [provider]  sertraline (ZOLOFT) 100 MG tablet Take 100 mg by mouth daily. Take with sertraline 50 mg to equal 150 mg   Yes [provider]  sertraline (ZOLOFT) 50 MG tablet Take 50 mg by mouth daily. Take with sertraline 100 mg to equal 150 mg    Yes [provider]  traZODone (DESYREL) 100 MG tablet Take 100 mg by mouth at bedtime.   Yes [provider]  carvedilol (COREG) 12.5 MG tablet TAKE 1 TABLET (12.5 MG TOTAL) BY MOUTH 2 (TWO) TIMES DAILY. Patient not taking: Reported on 06/13/2020 06/03/18   Sueanne Margarita, MD  insulin glargine (LANTUS) 100 UNIT/ML injection Inject 0.08 mLs (8 Units total) into the skin at bedtime. Patient not taking: Reported on 06/13/2020 08/25/18   Georgette Shell, MD  iron polysaccharides (NIFEREX) 150 MG capsule Take 1 capsule (150 mg total) by mouth daily. Patient not taking: Reported on 06/13/2020 08/26/18   Georgette Shell, MD  ondansetron (ZOFRAN) 4 MG tablet Take 1 tablet (4 mg total) by mouth every 6 (six) hours as needed for nausea. Patient not taking: Reported on 06/13/2020 08/25/18   Georgette Shell, MD    Allergies    Fosamax [alendronate], Horse-derived products, Clonidine derivatives, Darvocet [propoxyphene n-acetaminophen], Lipitor [atorvastatin], Sulfa drugs cross reactors, Tramadol, Vicodin [hydrocodone-acetaminophen], Penicillins, and Septra [sulfamethoxazole-trimethoprim]  Review of Systems   Review of Systems  Unable to perform ROS: Patient nonverbal    Physical Exam Updated Vital Signs BP (!) 133/56 (BP Location: Right Arm)    Pulse 75    Temp (!) 97.4 F (36.3 C) (Oral)    Resp 20    Ht 5' (1.524 m)    Wt 43.6 kg    SpO2 100%    BMI 18.77 kg/m   Physical Exam Vitals and nursing note reviewed.  Constitutional:      General: She is sleeping. She is not in acute distress.    Appearance: She is cachectic. She is ill-appearing.  HENT:     Head: Normocephalic and atraumatic.     Right Ear: External ear normal.  Left Ear: External ear normal.     Nose: Nose normal.  Eyes:     Conjunctiva/sclera: Conjunctivae normal.  Cardiovascular:     Rate and Rhythm: Normal rate and regular rhythm.     Heart sounds: No murmur heard.   Pulmonary:     Effort:  Pulmonary effort is normal. No respiratory distress.     Breath sounds: Normal breath sounds.  Abdominal:     Palpations: Abdomen is soft.     Tenderness: There is no abdominal tenderness.  Musculoskeletal:     Cervical back: Neck supple.  Skin:    General: Skin is warm and dry.     ED Results / Procedures / Treatments   Labs (all labs ordered are listed, but only abnormal results are displayed) Labs Reviewed  CBC WITH DIFFERENTIAL/PLATELET - Abnormal; Notable for the following components:      Result Value   MCV 110.1 (*)    RDW 18.2 (*)    nRBC 0.7 (*)    Monocytes Absolute 1.2 (*)    Abs Immature Granulocytes 0.09 (*)    All other components within normal limits  BASIC METABOLIC PANEL - Abnormal; Notable for the following components:   Sodium 159 (*)    Potassium 3.4 (*)    Chloride 122 (*)    Glucose, Bld 180 (*)    BUN 46 (*)    Creatinine, Ser 1.24 (*)    GFR calc non Af Amer 38 (*)    GFR calc Af Amer 44 (*)    All other components within normal limits  URINALYSIS, COMPLETE (UACMP) WITH MICROSCOPIC - Abnormal; Notable for the following components:   Color, Urine AMBER (*)    APPearance HAZY (*)    Hgb urine dipstick MODERATE (*)    Leukocytes,Ua TRACE (*)    Bacteria, UA MANY (*)    All other components within normal limits  CULTURE, BLOOD (ROUTINE X 2)    EKG EKG Interpretation  Date/Time:  Monday June 13 2020 21:27:39 EDT Ventricular Rate:  74 PR Interval:    QRS Duration: 150 QT Interval:  483 QTC Calculation: 536 R Axis:   -86 Text Interpretation: Sinus rhythm Paired ventricular premature complexes Left bundle branch block No significant change since last tracing Confirmed by Dorie Rank [phone]) on 06/14/2020 4:10:06 PM   Radiology CT Head Wo Contrast  Result Date: 06/13/2020 CLINICAL DATA:  Focal neuro deficit. Suspect stroke. Altered mental status, failure to thrive. EXAM: CT HEAD WITHOUT CONTRAST TECHNIQUE: Contiguous axial images were obtained  from the base of the skull through the vertex without intravenous contrast. COMPARISON:  CT head 11/15/2018 FINDINGS: Brain: Progressive atrophy which is now moderate to severe. Chronic microvascular ischemic changes in the white matter. Negative for acute infarct, hemorrhage, mass Vascular: Extensive atherosclerotic calcification in the carotid and vertebral arteries bilaterally. Negative for hyperdense vessel. Skull: No focal skeletal lesion. Sinuses/Orbits: Paranasal sinuses clear. No orbital lesion. Bilateral cataract extraction. Other: None IMPRESSION: No acute abnormality Moderate to severe atrophy which has progressed since 2019. Chronic microvascular ischemic change in the white matter. Electronically Signed   By: Franchot Gallo M.D.   On: 06/13/2020 19:25   DG Chest Portable 1 View  Result Date: 06/13/2020 CLINICAL DATA:  Evaluate PICC line placement EXAM: PORTABLE CHEST 1 VIEW COMPARISON:  10/11/2018 FINDINGS: Cardiac enlargement. Previous median sternotomy and CABG procedure. Left chest wall AICD is noted with leads in the right atrial appendage and right ventricle. The left upper extremity PICC line  tip is identified along the lateral margin of the radiograph within the left upper extremity. Small right pleural effusion. Bilateral peripheral predominant opacities are identified within the right upper lobe, right midlung and right base as well as the left lung base. IMPRESSION: 1. The left upper extremity PICC line tip is identified along the lateral margin of the radiograph within the left upper extremity. Recommend repositioning. 2. Cardiac enlargement. 3. Small right pleural effusion. 4. Bilateral pulmonary opacities concerning for multifocal infection. Electronically Signed   By: Kerby Moors M.D.   On: 06/13/2020 17:26    Procedures Procedures (including critical care time)  Medications Ordered in ED Medications  cefTRIAXone (ROCEPHIN) 1 g in sodium chloride 0.9 % 100 mL IVPB (0 g  Intravenous Stopped 06/13/20 2018)  azithromycin (ZITHROMAX) 500 mg in sodium chloride 0.9 % 250 mL IVPB (0 mg Intravenous Stopped 06/13/20 2125)  lactated ringers bolus 1,000 mL (0 mLs Intravenous Stopped 06/13/20 2206)    ED Course  I have reviewed the triage vital signs and the nursing notes.  Pertinent labs & imaging results that were available during my care of the patient were reviewed by me and considered in my medical decision making (see chart for details).    MDM Rules/Calculators/A&P                          Differential diagnosis: Hypernatremia, altered mental status, pneumonia  ED physician interpretation of imaging: Chest x-ray concerning for pneumonia ED physician interpretation of EKG: No STEMI.  Sinus rhythm ED physician interpretation of labs: Sodium of 159 concerning for symptomatic hypernatremia  MDM: Patient is a 53-year-old female, DNR with multiple medical problems including dementia who has had worsening decline over the last 3 days with no p.o. intake brought the ED for further evaluation found to have hyponatremia and probable pneumonia requiring IV antibiotics and further treatment inpatient.  Patient's vital signs are stable, patient afebrile.  Patient's physical exam is remarkable for wound with minimal responsiveness but protecting her airway.  Patient makes some vocalization and make some movements but has otherwise significant altered mental status.  Unable to get discharged with the facility to find out how long this has been progressing for.  Per daughter patient has not been acting right for at least 1 week when she tried to call her on the phone.  As patient has treatable diseases including hypernatremia and pneumonia hospitalization is indicated at this time.  Hospital service contacted and admitted the patient for further treatment and evaluation.  Diagnosis, treatment and plan of care was discussed and agreed upon with daughter.  Daughter comfortable with  admission at this time.   Final Clinical Impression(s) / ED Diagnoses Final diagnoses:  Altered mental status, unspecified altered mental status type  Hypernatremia  Pneumonia due to infectious organism, unspecified laterality, unspecified part of lung    Rx / DC Orders ED Discharge Orders    None       Delma Post, MD 06/14/20 Boyce Medici, MD 06/19/20 2055

## 2020-06-13 NOTE — Progress Notes (Signed)
CSW received call from [name], [place] guardian. Ms. [name] states that Pt care should be based in comfort care.

## 2020-06-13 NOTE — Social Work (Signed)
CSW called [name] at [place], guardian of the pt @[phone] in an effort to gather more information to provide care for the Pt.  Left HIPAA compliant voicemail

## 2020-06-13 NOTE — H&P (Signed)
History and Physical    [name] FFM:[id number] DOB: 1928-01-04 DOA: 06/13/2020  PCP: Patrick Jupiter   Patient coming from: SNF   Chief Complaint: Not speaking or eating   HPI: [name] is a 86 y.o. female with medical history significant for coronary artery disease, complete heart block with pacer, chronic combined systolic and diastolic CHF, chronic kidney disease stage III, now presenting from her SNF for change in her mental status marked by not speaking or eating.  Patient is accompanied by her daughter who assists with the history.  Patient's daughter reports that at her baseline, the patient is alert and oriented to self only, is unable to ambulate, and does not eat unless someone assist her.  When she called the patient on the phone 6 days ago, patient seemed to be much more confused than usual and seemed to not understand how to use the phone.  Per report of SNF personnel, she has not been eating or drinking in the past few days.  Patient has not complained of anything in particular, but has been moaning.  Patient has a guardian named [name] who was contacted from the ED and noted that the primary goal of care should be comfort.   ED Course: Upon arrival to the ED, patient is found to be afebrile, saturating 100% on 2 L/min of supplemental oxygen, normal respiratory and heart rates, and normal blood pressure.  EKG features sinus rhythm with PVCs and LBBB.  Noncontrast head CT is negative for acute intracranial abnormality.  Chest x-ray is notable for cardiomegaly with bilateral opacities that could represent a multifocal infection.  Chemistry panel features a sodium of 159, potassium 3.4, and creatinine of 1.24 which appears similar to her priors.  CBC is notable for macrocytosis and clumped platelets.  COVID-19 PCR is negative.  Patient was given a liter of lactated Ringer's in the ED, blood cultures were ordered, and Rocephin and azithromycin were ordered.  Review of  Systems:  All other systems reviewed and apart from HPI, are negative.  Past Medical History:  Diagnosis Date  . Arthritis    "left wrist; back" (07/29/2013)  . Cancer [facility]) Sept. 2015   Left Hand  -  Skin Cancer  . Carotid stenosis    occluded right intracranial ICA followed by Dr. Donnetta Hutching.  1-39% left ICA stenosis by dopplers 10/2017  . Chronic combined systolic and diastolic CHF (congestive heart failure) ([place])   . Chronic kidney disease, unspecified   . Complete heart block ([place]) 07/29/2013   a. s/p MDT dual chamber PPM  . Coronary artery disease    s/p CABG  . Hyperlipidemia   . Hypertension   . Ischemic cardiomyopathy     s/p MDT single chamber ICD 2006.  EF improved and device was downgraded to dual chamber PPM 2014.  EF normalized by echo 2014 and at time of battery changeout ICD changed to PPM.  EF then decreased to 20-25% by echo 04/2016.  Seen in AHF clinic and plan was for not pursue cath given her lack of sx and advanced age. Her Delene Loll was increased and it was decided that she was too high risk for CRTP   . Osteoporosis   . Overactive bladder   . Pacemaker   . Postoperative atrial fibrillation ([place])   . Pulmonary HTN ([place])    severe by echo 04/2016 - likely Group 2 from pulmonary venous HTN secondary to LV dysfunction and MR  . Severe mitral regurgitation  s/p MV ring.  Moderate MR and mild mitral stenosis on echo 04/2016  . Stroke ([place]) 07/2005  . Type II diabetes mellitus ([place])     Past Surgical History:  Procedure Laterality Date  . APPENDECTOMY  1949  . CARDIAC DEFIBRILLATOR PLACEMENT  11/2005   by Dr Leonia Reeves (MDT) she has a [phone] fidelis lead  . CATARACT EXTRACTION W/ INTRAOCULAR LENS  IMPLANT, BILATERAL Bilateral 1980's  . CORONARY ARTERY BYPASS GRAFT  07/2005   "CABG X3" (07/29/2013)  . FEMUR IM NAIL Left 08/20/2018   Procedure: INTRAMEDULLARY (IM) NAIL FEMORAL;  Surgeon: Frederik Pear, MD;  Location: [facility];  Service: Orthopedics;  Laterality: Left;  . MITRAL  VALVE REPLACEMENT  07/2005  . PERMANENT PACEMAKER INSERTION N/A 07/29/2013   upgrade of single chamber ICD to Medtronic Adapta L DR implanted with new RA and RV leads inserted  . SALPINGOOPHORECTOMY Bilateral 1976  . SHOULDER ARTHROSCOPY W/ ROTATOR CUFF REPAIR Right 1998  . TOTAL HIP ARTHROPLASTY Right 2001     reports that she has never smoked. She has never used smokeless tobacco. She reports that she does not drink alcohol and does not use drugs.  Allergies  Allergen Reactions  . Fosamax [Alendronate] Anaphylaxis  . Horse-Derived Products Hives  . Clonidine Derivatives Other (See Comments)    Unknown reaction  . Darvocet [Propoxyphene N-Acetaminophen] Nausea And Vomiting  . Lipitor [Atorvastatin] Swelling  . Sulfa Drugs Cross Reactors Hives and Itching  . Tramadol Nausea And Vomiting  . Vicodin [Hydrocodone-Acetaminophen] Other (See Comments)    lightheaded  . Penicillins Rash    Has patient had a PCN reaction causing immediate rash, facial/tongue/throat swelling, SOB or lightheadedness with hypotension: Yes Has patient had a PCN reaction causing severe rash involving mucus membranes or skin necrosis: No Has patient had a PCN reaction that required hospitalization Yes Has patient had a PCN reaction occurring within the last 10 years: No If all of the above answers are "NO", then may proceed with Cephalosporin  . Septra [Sulfamethoxazole-Trimethoprim] Rash    Family History  Problem Relation Age of Onset  . Cancer Mother        [name]   . Heart disease Mother        Before age 52  . Heart disease Sister        Before age 68-  CABG  . Cancer Sister        [name]  . Diabetes Sister   . Hyperlipidemia Sister   . Heart attack Sister   . Diabetes Daughter   . Coronary artery disease Other        family hx of  . Hypertension Other        family hx of  . Diabetes Other        family hx of     Prior to Admission medications   Medication Sig Start Date End Date Taking?  Authorizing Provider  acetaminophen (TYLENOL) 500 MG tablet Take 1,000 mg by mouth 3 (three) times daily.    Yes [provider]  aspirin 81 MG chewable tablet Chew 81 mg by mouth daily.   Yes [provider]  Cholecalciferol (VITAMIN D) 2000 UNITS tablet Take 2,000 Units by mouth daily.    Yes [provider]  divalproex (DEPAKOTE SPRINKLE) 125 MG capsule Take 375 mg by mouth 3 (three) times daily.   Yes [provider]  famotidine (PEPCID) 20 MG tablet Take 20 mg by mouth daily.   Yes [provider]  fish oil-omega-3 fatty acids 1000 MG capsule Take 1 g by mouth 2 (two) times daily.    Yes [provider]  hydrALAZINE (APRESOLINE) 10 MG tablet Take 10 mg by mouth 3 (three) times daily.   Yes [provider]  Lidocaine (ASPERCREME LIDOCAINE) 4 % PTCH Apply 1 patch topically in the morning and at bedtime. To both shoulders   Yes [provider]  Menthol, Topical Analgesic, (BIOFREEZE) 4 % GEL Apply topically in the morning and at bedtime. To bilateral shoulder, right wrist and left knee   Yes [provider]  metoprolol succinate (TOPROL-XL) 25 MG 24 hr tablet Take 12.5 mg by mouth daily.   Yes [provider]  nitroGLYCERIN (NITROSTAT) 0.4 MG SL tablet Place 0.4 mg under the tongue every 5 (five) minutes as needed for chest pain (max 3 doses/ recheck vital signs 30 minutes after admnistering).   Yes [provider]  oxybutynin (DITROPAN-XL) 5 MG 24 hr tablet Take 5 mg by mouth at bedtime.   Yes [provider]  polyethylene glycol (MIRALAX / GLYCOLAX) packet Take 17 g by mouth daily. 08/26/18  Yes Georgette Shell, MD  sacubitril-valsartan (ENTRESTO) 49-51 MG Take 1 tablet by mouth 2 (two) times daily. 07/07/18  Yes Larey Dresser, MD  senna-docusate (SENNA-S) 8.6-50 MG tablet Take 2 tablets by mouth at bedtime.    Yes [provider]  sertraline (ZOLOFT) 100 MG tablet Take 100 mg  by mouth daily. Take with sertraline 50 mg to equal 150 mg   Yes [provider]  sertraline (ZOLOFT) 50 MG tablet Take 50 mg by mouth daily. Take with sertraline 100 mg to equal 150 mg   Yes [provider]  traZODone (DESYREL) 100 MG tablet Take 100 mg by mouth at bedtime.   Yes [provider]  carvedilol (COREG) 12.5 MG tablet TAKE 1 TABLET (12.5 MG TOTAL) BY MOUTH 2 (TWO) TIMES DAILY. Patient not taking: Reported on 06/13/2020 06/03/18   Sueanne Margarita, MD  insulin glargine (LANTUS) 100 UNIT/ML injection Inject 0.08 mLs (8 Units total) into the skin at bedtime. Patient not taking: Reported on 06/13/2020 08/25/18   Georgette Shell, MD  iron polysaccharides (NIFEREX) 150 MG capsule Take 1 capsule (150 mg total) by mouth daily. Patient not taking: Reported on 06/13/2020 08/26/18   Georgette Shell, MD  ondansetron (ZOFRAN) 4 MG tablet Take 1 tablet (4 mg total) by mouth every 6 (six) hours as needed for nausea. Patient not taking: Reported on 06/13/2020 08/25/18   Georgette Shell, MD    Physical Exam: Vitals:   06/13/20 2015 06/13/20 2200 06/13/20 2300 06/13/20 2306  BP: 122/80 139/76 138/76   Pulse: 73 72 67   Resp: (!) 24 16 19    Temp:    99.2 F (37.3 C)  TempSrc:    Temporal  SpO2: 100% 100% 99%   Weight:      Height:        Constitutional: No respiratory distress, appears uncomfortable   Eyes: PERTLA, lids and conjunctivae normal ENMT: Mucous membranes are moist. Posterior pharynx clear of any exudate or lesions.   Neck: normal, supple, no masses, no thyromegaly Respiratory: no wheezing, no crackles. No accessory muscle use.  Cardiovascular: S1 & S2 heard, regular rate and rhythm. No extremity edema.   Abdomen: No distension, no tenderness, soft. Bowel sounds active.  Musculoskeletal: no clubbing / cyanosis. No joint deformity upper and lower extremities.   Skin: Poor turgor, scattered  ecchymoses. Warm, dry, well-perfused. Neurologic: No  gross facial asymmetry. Sensation intact. Moving all extremities.    Psychiatric: Alert, makes eye contact, but only moans and does not speak or answer basic questions with head nod/shake.     Labs and Imaging on Admission: I have personally reviewed following labs and imaging studies  CBC: Recent Labs  Lab 06/13/20 1839  WBC 9.1  NEUTROABS 6.8  HGB 13.7  HCT 45.6  MCV 110.1*  PLT PLATELET CLUMPS NOTED ON SMEAR, COUNT APPEARS DECREASED   Basic Metabolic Panel: Recent Labs  Lab 06/13/20 1839  NA 159*  K 3.4*  CL 122*  CO2 23  GLUCOSE 180*  BUN 46*  CREATININE 1.24*  CALCIUM 9.0   GFR: Estimated Creatinine Clearance: 24 mL/min (A) (by C-G formula based on SCr of 1.24 mg/dL (H)). Liver Function Tests: No results for input(s): AST, ALT, ALKPHOS, BILITOT, PROT, ALBUMIN in the last 168 hours. No results for input(s): LIPASE, AMYLASE in the last 168 hours. No results for input(s): AMMONIA in the last 168 hours. Coagulation Profile: No results for input(s): INR, PROTIME in the last 168 hours. Cardiac Enzymes: No results for input(s): CKTOTAL, CKMB, CKMBINDEX, TROPONINI in the last 168 hours. BNP (last 3 results) No results for input(s): PROBNP in the last 8760 hours. HbA1C: No results for input(s): HGBA1C in the last 72 hours. CBG: Recent Labs  Lab 06/13/20 1712  GLUCAP 138*   Lipid Profile: No results for input(s): CHOL, HDL, LDLCALC, TRIG, CHOLHDL, LDLDIRECT in the last 72 hours. Thyroid Function Tests: No results for input(s): TSH, T4TOTAL, FREET4, T3FREE, THYROIDAB in the last 72 hours. Anemia Panel: No results for input(s): VITAMINB12, FOLATE, FERRITIN, TIBC, IRON, RETICCTPCT in the last 72 hours. Urine analysis:    Component Value Date/Time   COLORURINE YELLOW 01/18/2014 0846   APPEARANCEUR CLEAR 01/18/2014 0846   LABSPEC 1.008 01/18/2014 0846   PHURINE 5.5 01/18/2014 0846   GLUCOSEU NEGATIVE 01/18/2014 0846   HGBUR NEGATIVE 01/18/2014 0846   BILIRUBINUR  NEGATIVE 01/18/2014 0846   KETONESUR NEGATIVE 01/18/2014 0846   PROTEINUR NEGATIVE 01/18/2014 0846   UROBILINOGEN 0.2 01/18/2014 0846   NITRITE NEGATIVE 01/18/2014 0846   LEUKOCYTESUR NEGATIVE 01/18/2014 0846   Sepsis Labs: @LABRCNTIP (procalcitonin:4,lacticidven:4) ) Recent Results (from the past 240 hour(s))  SARS Coronavirus 2 by RT PCR (hospital order, performed in [facility] hospital lab) Nasopharyngeal Nasopharyngeal Swab     Status: None   Collection Time: 06/13/20  7:23 PM   Specimen: Nasopharyngeal Swab  Result Value Ref Range Status   SARS Coronavirus 2 NEGATIVE NEGATIVE Final    Comment: (NOTE) SARS-CoV-2 target nucleic acids are NOT DETECTED.  The SARS-CoV-2 RNA is generally detectable in upper and lower respiratory specimens during the acute phase of infection. The lowest concentration of SARS-CoV-2 viral copies this assay can detect is 250 copies / mL. A negative result does not preclude SARS-CoV-2 infection and should not be used as the sole basis for treatment or other patient management decisions.  A negative result may occur with improper specimen collection / handling, submission of specimen other than nasopharyngeal swab, presence of viral mutation(s) within the areas targeted by this assay, and inadequate number of viral copies (<250 copies / mL). A negative result must be combined with clinical observations, patient history, and epidemiological information.  Fact Sheet for Patients:   StrictlyIdeas.no  Fact Sheet for Healthcare Providers: BankingDealers.co.za  This test is not yet approved or  cleared by the [place] FDA and has been  authorized for detection and/or diagnosis of SARS-CoV-2 by FDA under an Emergency Use Authorization (EUA).  This EUA will remain in effect (meaning this test can be used) for the duration of the COVID-19 declaration under Section 564(b)(1) of the Act, 21 U.S.C. section  360bbb-3(b)(1), unless the authorization is terminated or revoked sooner.  Performed at [facility], [place] [address] [place], [place] [address]      Radiological Exams on Admission: CT Head Wo Contrast  Result Date: 06/13/2020 CLINICAL DATA:  Focal neuro deficit. Suspect stroke. Altered mental status, failure to thrive. EXAM: CT HEAD WITHOUT CONTRAST TECHNIQUE: Contiguous axial images were obtained from the base of the skull through the vertex without intravenous contrast. COMPARISON:  CT head 11/15/2018 FINDINGS: Brain: Progressive atrophy which is now moderate to severe. Chronic microvascular ischemic changes in the white matter. Negative for acute infarct, hemorrhage, mass Vascular: Extensive atherosclerotic calcification in the carotid and vertebral arteries bilaterally. Negative for hyperdense vessel. Skull: No focal skeletal lesion. Sinuses/Orbits: Paranasal sinuses clear. No orbital lesion. Bilateral cataract extraction. Other: None IMPRESSION: No acute abnormality Moderate to severe atrophy which has progressed since 2019. Chronic microvascular ischemic change in the white matter. Electronically Signed   By: Franchot Gallo M.D.   On: 06/13/2020 19:25   DG Chest Portable 1 View  Result Date: 06/13/2020 CLINICAL DATA:  Evaluate PICC line placement EXAM: PORTABLE CHEST 1 VIEW COMPARISON:  10/11/2018 FINDINGS: Cardiac enlargement. Previous median sternotomy and CABG procedure. Left chest wall AICD is noted with leads in the right atrial appendage and right ventricle. The left upper extremity PICC line tip is identified along the lateral margin of the radiograph within the left upper extremity. Small right pleural effusion. Bilateral peripheral predominant opacities are identified within the right upper lobe, right midlung and right base as well as the left lung base. IMPRESSION: 1. The left upper extremity PICC line tip is identified along the lateral margin of the radiograph within the  left upper extremity. Recommend repositioning. 2. Cardiac enlargement. 3. Small right pleural effusion. 4. Bilateral pulmonary opacities concerning for multifocal infection. Electronically Signed   By: Kerby Moors M.D.   On: 06/13/2020 17:26    EKG: Independently reviewed. Sinus rhythm, PVCs, LBBB.   Assessment/Plan  1. Acute encephalopathy   - Presents from SNF due to no longer eating or drinking, and no longer speaking  - Timeline is not clear but she was more confused than normal and not speaking on the phone with daughter on 06/07/20   - Per daughter, the patient has been A&O x1, unable to ambulate, and requiring assistance with eating at her baseline  - No acute findings on head CT  - She is hypernatremic, but this is suspected secondary to not eating/drinking which could possibly be from PNA or toxic-metabolic etiology, or possibly end-stage dementia   - Plan to continue abx for possible PNA as below, check additional labs including TSH, B12, and RPR, and continue supportive care   2. ?Pneumonia  - Presents from SNF due to no longer eating or drinking, and no longer speaking, and is found to have CXR findings that could reflect pneumonia  - She is not tachypneic or hypoxic in ED and there is no fever or leukocytosis  - COVID-19 pcr is negative  - Blood cultures, Rocephin, and azithromycin were ordered from ED  - Check procalcitonin, check strep pneumo and legionella antigens, continue Rocephin and azithromycin for now   3. Hypernatremia  - Serum sodium is  159 in ED in setting of hypovolemia after not eating or drinking recently  - She was given a liter of LR in ED  - Hold further IVF for now given low EF, repeat chem panel in am   4. Chronic combined systolic & diastolic CHF  - Appears compensated  - EF was 30-35% in 2018  - Monitor weight and I/Os, continue Entresto and beta-blocker as tolerated   5. Paroxysmal atrial fibrillation  - In sinus rhythm on admission  -  CHADS-VASc at least 72 (age x2, CVA x2, gender, DM, CHF, CAD)  - Not anticoagulated due to frequent falls, continue beta-blocker as tolerated   6. CKD IIIb  - SCr is 1.24 in ED, similar to priors  - Renally-dose medications, monitor    7. Type II DM   - No recent A1c available, serum glucose 180 in ED, looks like she is diet-controlled now at the SNF  - Check CBGs and use a low-intensity SSI     DVT prophylaxis: SCDs  Code Status: DNR, confirmed with patient's guarding  Family Communication: Daughter updated in ED  Disposition Plan:  Patient is from: SNF  Anticipated d/c is to: TBD  Anticipated d/c date is: 6/15 or 6/16 Patient currently: Pending additional investigation into encephalopathy and possible pneumonia   Consults called: none  Admission status: Observation     Vianne Bulls, MD [facility] Pager: See [email or web address]  If 7AM-7PM, please contact the daytime attending [email or web address]  06/13/2020, 11:30 PM

## 2020-06-13 NOTE — Progress Notes (Addendum)
VAST consulted to assess PICC in LA. Pt has  appears to be SL PICC in left upper arm. However, x-ray shows line tip to be in upper left arm. Visualization of line does not show excessive line pulled out at insertion site. Biopatch is saturated in blood. Attempt to obtain blood return from line unsuccessful.  Contacted PICC RN to assess line and x-ray. Zenia Resides, RN. After further inspection, line confirmed to be a midline. Advised unit RN, Claiborne Billings of findings. Educated that VAST RN will change dressing and assess line; until assessment completed, line cannot be used for infusion. Kelly verbalized understanding.

## 2020-06-13 NOTE — Progress Notes (Signed)
VAST changed midline dressing and assessed line as charted in flowsheets.  Line does not appear to be leaking, flushes easily, but does not give blood return.  Ambrose Pancoast, RN that line can be used for fluid infusion as well as med infusion as long as no vesicants are infused. Also educated that Vancomycin cannot be given for more than 6 days via this line. Claiborne Billings, RN verbalized understanding.

## 2020-06-13 NOTE — ED Notes (Signed)
Attempted to call [facility]  Several times no answer

## 2020-06-14 DIAGNOSIS — F0390 Unspecified dementia without behavioral disturbance: Secondary | ICD-10-CM | POA: Diagnosis present

## 2020-06-14 DIAGNOSIS — I130 Hypertensive heart and chronic kidney disease with heart failure and stage 1 through stage 4 chronic kidney disease, or unspecified chronic kidney disease: Secondary | ICD-10-CM | POA: Diagnosis present

## 2020-06-14 DIAGNOSIS — R1310 Dysphagia, unspecified: Secondary | ICD-10-CM | POA: Diagnosis present

## 2020-06-14 DIAGNOSIS — Z66 Do not resuscitate: Secondary | ICD-10-CM | POA: Diagnosis present

## 2020-06-14 DIAGNOSIS — I5042 Chronic combined systolic (congestive) and diastolic (congestive) heart failure: Secondary | ICD-10-CM | POA: Diagnosis present

## 2020-06-14 DIAGNOSIS — I2510 Atherosclerotic heart disease of native coronary artery without angina pectoris: Secondary | ICD-10-CM | POA: Diagnosis present

## 2020-06-14 DIAGNOSIS — N390 Urinary tract infection, site not specified: Secondary | ICD-10-CM | POA: Diagnosis present

## 2020-06-14 DIAGNOSIS — R627 Adult failure to thrive: Secondary | ICD-10-CM | POA: Diagnosis present

## 2020-06-14 DIAGNOSIS — Z20822 Contact with and (suspected) exposure to covid-19: Secondary | ICD-10-CM | POA: Diagnosis present

## 2020-06-14 DIAGNOSIS — E876 Hypokalemia: Secondary | ICD-10-CM | POA: Diagnosis present

## 2020-06-14 DIAGNOSIS — E43 Unspecified severe protein-calorie malnutrition: Secondary | ICD-10-CM | POA: Diagnosis present

## 2020-06-14 DIAGNOSIS — I442 Atrioventricular block, complete: Secondary | ICD-10-CM | POA: Diagnosis present

## 2020-06-14 DIAGNOSIS — I480 Paroxysmal atrial fibrillation: Secondary | ICD-10-CM | POA: Diagnosis present

## 2020-06-14 DIAGNOSIS — L8990 Pressure ulcer of unspecified site, unspecified stage: Secondary | ICD-10-CM | POA: Insufficient documentation

## 2020-06-14 DIAGNOSIS — G9340 Encephalopathy, unspecified: Secondary | ICD-10-CM | POA: Diagnosis not present

## 2020-06-14 DIAGNOSIS — E870 Hyperosmolality and hypernatremia: Secondary | ICD-10-CM | POA: Diagnosis present

## 2020-06-14 DIAGNOSIS — N3281 Overactive bladder: Secondary | ICD-10-CM | POA: Diagnosis present

## 2020-06-14 DIAGNOSIS — E785 Hyperlipidemia, unspecified: Secondary | ICD-10-CM | POA: Diagnosis present

## 2020-06-14 DIAGNOSIS — R64 Cachexia: Secondary | ICD-10-CM | POA: Diagnosis present

## 2020-06-14 DIAGNOSIS — I255 Ischemic cardiomyopathy: Secondary | ICD-10-CM | POA: Diagnosis present

## 2020-06-14 DIAGNOSIS — I82409 Acute embolism and thrombosis of unspecified deep veins of unspecified lower extremity: Secondary | ICD-10-CM | POA: Diagnosis not present

## 2020-06-14 DIAGNOSIS — Z515 Encounter for palliative care: Secondary | ICD-10-CM | POA: Diagnosis present

## 2020-06-14 DIAGNOSIS — M810 Age-related osteoporosis without current pathological fracture: Secondary | ICD-10-CM | POA: Diagnosis present

## 2020-06-14 DIAGNOSIS — J690 Pneumonitis due to inhalation of food and vomit: Secondary | ICD-10-CM | POA: Diagnosis present

## 2020-06-14 DIAGNOSIS — Z681 Body mass index (BMI) 19 or less, adult: Secondary | ICD-10-CM | POA: Diagnosis not present

## 2020-06-14 DIAGNOSIS — R4182 Altered mental status, unspecified: Secondary | ICD-10-CM | POA: Diagnosis not present

## 2020-06-14 DIAGNOSIS — G9341 Metabolic encephalopathy: Secondary | ICD-10-CM | POA: Diagnosis present

## 2020-06-14 DIAGNOSIS — N1832 Chronic kidney disease, stage 3b: Secondary | ICD-10-CM | POA: Diagnosis present

## 2020-06-14 LAB — URINALYSIS, COMPLETE (UACMP) WITH MICROSCOPIC
Bilirubin Urine: NEGATIVE
Glucose, UA: NEGATIVE mg/dL
Ketones, ur: NEGATIVE mg/dL
Nitrite: NEGATIVE
Protein, ur: NEGATIVE mg/dL
Specific Gravity, Urine: 1.019 (ref 1.005–1.030)
pH: 5 (ref 5.0–8.0)

## 2020-06-14 LAB — BASIC METABOLIC PANEL
Anion gap: 14 (ref 5–15)
BUN: 41 mg/dL — ABNORMAL HIGH (ref 8–23)
CO2: 26 mmol/L (ref 22–32)
Calcium: 8.8 mg/dL — ABNORMAL LOW (ref 8.9–10.3)
Chloride: 119 mmol/L — ABNORMAL HIGH (ref 98–111)
Creatinine, Ser: 1.1 mg/dL — ABNORMAL HIGH (ref 0.44–1.00)
GFR calc Af Amer: 51 mL/min — ABNORMAL LOW (ref >60)
GFR calc non Af Amer: 44 mL/min — ABNORMAL LOW (ref >60)
Glucose, Bld: 166 mg/dL — ABNORMAL HIGH (ref 70–99)
Potassium: 3.6 mmol/L (ref 3.5–5.1)
Sodium: 159 mmol/L — ABNORMAL HIGH (ref 135–145)

## 2020-06-14 LAB — FOLATE: Folate: 20.7 ng/mL (ref >5.9)

## 2020-06-14 LAB — CBC
HCT: 44.9 % (ref 36.0–46.0)
Hemoglobin: 13.4 g/dL (ref 12.0–15.0)
MCH: 32.8 pg (ref 26.0–34.0)
MCHC: 29.8 g/dL — ABNORMAL LOW (ref 30.0–36.0)
MCV: 110 fL — ABNORMAL HIGH (ref 80.0–100.0)
Platelets: UNDETERMINED 10*3/uL (ref 150–400)
RBC: 4.08 MIL/uL (ref 3.87–5.11)
RDW: 17.9 % — ABNORMAL HIGH (ref 11.5–15.5)
WBC: 9.1 10*3/uL (ref 4.0–10.5)
nRBC: 0.4 % — ABNORMAL HIGH (ref 0.0–0.2)

## 2020-06-14 LAB — TSH: TSH: 3.111 u[IU]/mL (ref 0.350–4.500)

## 2020-06-14 LAB — PROCALCITONIN: Procalcitonin: <0.1 ng/mL

## 2020-06-14 LAB — GLUCOSE, CAPILLARY
Glucose-Capillary: 131 mg/dL — ABNORMAL HIGH (ref 70–99)
Glucose-Capillary: 132 mg/dL — ABNORMAL HIGH (ref 70–99)
Glucose-Capillary: 144 mg/dL — ABNORMAL HIGH (ref 70–99)
Glucose-Capillary: 129 mg/dL — ABNORMAL HIGH (ref 70–99)

## 2020-06-14 LAB — HEMOGLOBIN A1C
Hgb A1c MFr Bld: 5.9 % — ABNORMAL HIGH (ref 4.8–5.6)
Mean Plasma Glucose: 122.63 mg/dL

## 2020-06-14 LAB — MAGNESIUM: Magnesium: 2 mg/dL (ref 1.7–2.4)

## 2020-06-14 LAB — AMMONIA: Ammonia: 40 umol/L — ABNORMAL HIGH (ref 9–35)

## 2020-06-14 LAB — VITAMIN B12: Vitamin B-12: 1820 pg/mL — ABNORMAL HIGH (ref 180–914)

## 2020-06-14 MED ORDER — ORAL CARE MOUTH RINSE
15.0000 mL | Freq: Two times a day (BID) | OROMUCOSAL | Status: DC
  Administered 2020-06-14 – 2020-06-20 (×12): 15 mL via OROMUCOSAL

## 2020-06-14 MED ORDER — SODIUM CHLORIDE 0.9 % IV SOLN
500.0000 mg | INTRAVENOUS | Status: AC
  Administered 2020-06-14 – 2020-06-16 (×3): 500 mg via INTRAVENOUS
  Filled 2020-06-14 (×3): qty 500

## 2020-06-14 MED ORDER — AZITHROMYCIN 500 MG PO TABS
500.0000 mg | ORAL_TABLET | ORAL | Status: DC
  Filled 2020-06-14: qty 1

## 2020-06-14 MED ORDER — DEXTROSE 5 % IV SOLN: INTRAVENOUS | Status: DC

## 2020-06-14 MED ORDER — CHLORHEXIDINE GLUCONATE 0.12 % MT SOLN
15.0000 mL | Freq: Two times a day (BID) | OROMUCOSAL | Status: DC
  Administered 2020-06-14 – 2020-06-21 (×15): 15 mL via OROMUCOSAL
  Filled 2020-06-14 (×13): qty 15

## 2020-06-14 NOTE — Progress Notes (Signed)
Per [name] MD., Okay not to give patient oral medication and food at this time.

## 2020-06-14 NOTE — Progress Notes (Addendum)
No morning meds have been given to patient as it is hard to determine her orientation status. Not sure the last time she had anything to eat or drink, pending swallowing study and their evaluation before administering medications and anything orally. Although patient very resistant by closing her mouth tightly, oral care provided, mouth swabbed with and moisturized.

## 2020-06-14 NOTE — Progress Notes (Signed)
Per [name], if MD or provider under patients care needs to update or speak with someone regarding patient she has provided name and number of Margretta Sidle, [place] Supervisor, [phone]) if Aretha Parrot cannot be reached.

## 2020-06-14 NOTE — Evaluation (Signed)
Clinical/Bedside Swallow Evaluation Patient Details  Name: [name] MRN: [id number] Date of Birth: 06-09-1928  Today's Date: 06/14/2020 Time: SLP Start Time (ACUTE ONLY): 80 SLP Stop Time (ACUTE ONLY): 1413 SLP Time Calculation (min) (ACUTE ONLY): 11 min  Past Medical History:  Past Medical History:  Diagnosis Date  . Arthritis    "left wrist; back" (07/29/2013)  . Cancer [facility]) Sept. 2015   Left Hand  -  Skin Cancer  . Carotid stenosis    occluded right intracranial ICA followed by Dr. Donnetta Hutching.  1-39% left ICA stenosis by dopplers 10/2017  . Chronic combined systolic and diastolic CHF (congestive heart failure) ([place])   . Chronic kidney disease, unspecified   . Complete heart block ([place]) 07/29/2013   a. s/p MDT dual chamber PPM  . Coronary artery disease    s/p CABG  . Hyperlipidemia   . Hypertension   . Ischemic cardiomyopathy     s/p MDT single chamber ICD 2006.  EF improved and device was downgraded to dual chamber PPM 2014.  EF normalized by echo 2014 and at time of battery changeout ICD changed to PPM.  EF then decreased to 20-25% by echo 04/2016.  Seen in AHF clinic and plan was for not pursue cath given her lack of sx and advanced age. Her Delene Loll was increased and it was decided that she was too high risk for CRTP   . Osteoporosis   . Overactive bladder   . Pacemaker   . Postoperative atrial fibrillation ([place])   . Pulmonary HTN ([place])    severe by echo 04/2016 - likely Group 2 from pulmonary venous HTN secondary to LV dysfunction and MR  . Severe mitral regurgitation    s/p MV ring.  Moderate MR and mild mitral stenosis on echo 04/2016  . Stroke ([place]) 07/2005  . Type II diabetes mellitus ([place])    Past Surgical History:  Past Surgical History:  Procedure Laterality Date  . APPENDECTOMY  1949  . CARDIAC DEFIBRILLATOR PLACEMENT  11/2005   by Dr Leonia Reeves (MDT) she has a [phone] fidelis lead  . CATARACT EXTRACTION W/ INTRAOCULAR LENS  IMPLANT, BILATERAL Bilateral 1980's  .  CORONARY ARTERY BYPASS GRAFT  07/2005   "CABG X3" (07/29/2013)  . FEMUR IM NAIL Left 08/20/2018   Procedure: INTRAMEDULLARY (IM) NAIL FEMORAL;  Surgeon: Frederik Pear, MD;  Location: [facility] ORS;  Service: Orthopedics;  Laterality: Left;  . MITRAL VALVE REPLACEMENT  07/2005  . PERMANENT PACEMAKER INSERTION N/A 07/29/2013   upgrade of single chamber ICD to Medtronic Adapta L DR implanted with new RA and RV leads inserted  . SALPINGOOPHORECTOMY Bilateral 1976  . SHOULDER ARTHROSCOPY W/ ROTATOR CUFF REPAIR Right 1998  . TOTAL HIP ARTHROPLASTY Right 2001   HPI:  Pt is a 86 yo female admitted from SNF with AMS; CXR concerning for PNA. She had reportedly stopped eating and drinking over the several days leading up to admission. MBS in 2016 with minimal pharyngeal dysphagia but esophagram revealed severe esophageal dysmotility, likely presbyesophagus. PMH includes: advanced dementia, DM, CVA, afib, osteoporosis, HTN, HLD, CAD, CKD, CHF   Assessment / Plan / Recommendation Clinical Impression  Despite multimodal cueing and various bolus types offered, pt makes no attempts at swallowing. She is alert but repetitively saying "mama" throughout evaluation. She sits with a mouth-open posture, but at times she closes her lips in resistance to oral care or PO trials. Even when she allows a spoon to enter her mouth, there is not initiation for  oral preparation or transit. Pt would be at risk for aspiration as well as poor nutrition and dehydration, with her risk for aspiration also increased by her h/o esophageal dysphagia. Recommendation would be NPO with frequent oral care unless comfort diet is wanted. At that point pt could have what she will take, although I think her intake would likely remain very limited.    SLP Visit Diagnosis: Dysphagia, unspecified (R13.10)    Aspiration Risk  Risk for inadequate nutrition/hydration;Moderate aspiration risk    Diet Recommendation NPO (unless opting for comfort feeds)    Medication Administration: Via alternative means    Other  Recommendations Oral Care Recommendations: Oral care QID Other Recommendations: Have oral suction available   Follow up Recommendations Skilled Nursing facility      Frequency and Duration min 2x/week  2 weeks       Prognosis Prognosis for Safe Diet Advancement: Guarded Barriers to Reach Goals: Cognitive deficits      Swallow Study   General HPI: Pt is a 86 yo female admitted from SNF with AMS; CXR concerning for PNA. She had reportedly stopped eating and drinking over the several days leading up to admission. MBS in 2016 with minimal pharyngeal dysphagia but esophagram revealed severe esophageal dysmotility, likely presbyesophagus. PMH includes: advanced dementia, DM, CVA, afib, osteoporosis, HTN, HLD, CAD, CKD, CHF Type of Study: Bedside Swallow Evaluation Previous Swallow Assessment: see HPI Diet Prior to this Study: Regular;Thin liquids Temperature Spikes Noted: No Respiratory Status: Nasal cannula History of Recent Intubation: No Behavior/Cognition: Alert;Doesn't follow directions Oral Cavity Assessment: Dry (limited visibility) Oral Care Completed by SLP: Yes (as much as pt would allow) Oral Cavity - Dentition: Adequate natural dentition;Poor condition Self-Feeding Abilities: Total assist Patient Positioning: Upright in bed Baseline Vocal Quality: Normal Volitional Swallow: Unable to elicit    Oral/Motor/Sensory Function Overall Oral Motor/Sensory Function: Generalized oral weakness   Ice Chips Ice chips: Impaired Presentation: Spoon Oral Phase Impairments: Poor awareness of bolus   Thin Liquid Thin Liquid: Impaired Presentation:  (swab) Oral Phase Impairments: Poor awareness of bolus    Nectar Thick Nectar Thick Liquid: Impaired Presentation: Spoon Oral Phase Impairments: Poor awareness of bolus   Honey Thick Honey Thick Liquid: Not tested   Puree Puree: Impaired Presentation: Spoon Oral Phase  Impairments: Poor awareness of bolus   Solid     Solid: Not tested      Osie Bond., M.A. [place] Pager [phone] Office [phone]  06/14/2020,2:25 PM

## 2020-06-14 NOTE — Progress Notes (Addendum)
PROGRESS NOTE    [name]  MWU:[id number] DOB: 1928/02/06 DOA: 06/13/2020 PCP: Patrick Jupiter  Brief Narrative: [name] is a 86 year old female with advanced dementia from SNF, she has a history of CAD, chronic systolic and diastolic CHF, EF of 72%, chronic kidney disease stage III, complete heart block status post pacer was sent to the ED from her nursing home with worsening mental status, decreased p.o. intake.  Reportedly at baseline is alert and oriented to self only, unable to ambulate, dependent on others for all ADLs, apparently stopped eating or drinking in the last few days, [name] is her guardian/representative [name]. [facility] case representative was contacted by her providers at [facility] on 6/10 with suggestions for hospice and comfort focused care, this was escalated up to her managers, still awaiting further discussions, in the meantime patient became more drowsy and lethargic and was sent to the emergency room.  She was also recently started on antibiotics for UTI and had a PICC line placed.  Work-up in the ED, noted normal vital signs, EKG with left bundle branch block, CT head showed advanced atrophy, no acute findings, chest x-ray noted cardiomegaly and possible bilateral opacities, his serum sodium was 159 and creatinine was 1.2. -She was started on IV fluids and antibiotics  Assessment & Plan:   Acute metabolic encephalopathy Hypernatremia -Could be secondary to dehydration, hypernatremia in the background of advanced dementia -Cannot rule out aspiration pneumonia as well -Currently on IV ceftriaxone and azithromycin -Continue D5 water  Possible pneumonia -Suspect aspiration, dysphagia in the setting of advanced dementia -Check SLP evaluation -Continue antibiotics as above, limited utility in the setting of all her comorbidities  Advanced dementia -Bedbound, total care, oriented to self only at baseline -Now with ongoing failure to  thrive -Hospice and comfort focused care is my recommendation, pt has a state guardian, I called patient's DSS representative [name] at [phone] and notified her regarding this.  She also received a request last week from her medical providers at [facility] recommending hospice, await further decisions from DSS  Chronic combined systolic and diastolic CHF -Last echo with EF of 30 to 35% -Remains on Entresto and beta-blocker  Paroxysmal atrial fibrillation -Not on anticoagulation at baseline, continue beta-blocker  Stage IIIb chronic kidney disease -Stable  Type 2 diabetes mellitus -Not on meds, sensitive sliding scale  DVT prophylaxis: SCDs Code Status: DNR  family Communication: No family at bedside, called and updated patient's guardian DSS-representative [name] at [phone] Disposition Plan:  Status is: Observation  The patient will require care spanning > 2 midnights and should be moved to inpatient because: Inpatient level of care appropriate due to severity of illness  Dispo: The patient is from: SNF              Anticipated d/c is to: SNF              Anticipated d/c date is: > 3 days              Patient currently is not medically stable to d/c.  Consultants:      Procedures:   Antimicrobials:    Subjective: -Remains drowsy, confused, no new events overnight  Objective: Vitals:   06/14/20 0446 06/14/20 0450 06/14/20 0500 06/14/20 0730  BP: (!) 120/59   124/82  Pulse:  78  74  Resp:      Temp: (!) 97.2 F (36.2 C)   (!) 97.1 F (36.2 C)  TempSrc: Axillary  Axillary  SpO2:  95%    Weight:   43.6 kg   Height:        Intake/Output Summary (Last 24 hours) at 06/14/2020 1021 Last data filed at 06/14/2020 0313 Gross per 24 hour  Intake 1450 ml  Output --  Net 1450 ml   Filed Weights   06/13/20 1717 06/14/20 0500  Weight: 58.1 kg 43.6 kg    Examination:  General exam: Elderly chronically ill-appearing female, awake alert,  mumbling, disoriented and confused Respiratory system: Few scattered rhonchi otherwise clear Cardiovascular system: S1 & S2 heard, RRR.  Systolic murmur  gastrointestinal system: Abdomen is nondistended, soft and nontender.Normal bowel sounds heard. Central nervous system: Alert and oriented. No focal neurological deficits. Extremities: No edema Skin: No rashes on exposed skin  psychiatry: Mood & affect appropriate.     Data Reviewed:   CBC: Recent Labs  Lab 06/13/20 1839 06/14/20 0139  WBC 9.1 9.1  NEUTROABS 6.8  --   HGB 13.7 13.4  HCT 45.6 44.9  MCV 110.1* 110.0*  PLT PLATELET CLUMPS NOTED ON SMEAR, COUNT APPEARS DECREASED PLATELET CLUMPS NOTED ON SMEAR, UNABLE TO ESTIMATE   Basic Metabolic Panel: Recent Labs  Lab 06/13/20 1839 06/14/20 0139  NA 159* 159*  K 3.4* 3.6  CL 122* 119*  CO2 23 26  GLUCOSE 180* 166*  BUN 46* 41*  CREATININE 1.24* 1.10*  CALCIUM 9.0 8.8*  MG  --  2.0   GFR: Estimated Creatinine Clearance: 23.4 mL/min (A) (by C-G formula based on SCr of 1.1 mg/dL (H)). Liver Function Tests: No results for input(s): AST, ALT, ALKPHOS, BILITOT, PROT, ALBUMIN in the last 168 hours. No results for input(s): LIPASE, AMYLASE in the last 168 hours. Recent Labs  Lab 06/14/20 0139  AMMONIA 40*   Coagulation Profile: No results for input(s): INR, PROTIME in the last 168 hours. Cardiac Enzymes: No results for input(s): CKTOTAL, CKMB, CKMBINDEX, TROPONINI in the last 168 hours. BNP (last 3 results) No results for input(s): PROBNP in the last 8760 hours. HbA1C: Recent Labs    06/14/20 0139  HGBA1C 5.9*   CBG: Recent Labs  Lab 06/13/20 1712 06/14/20 0729  GLUCAP 138* 131*   Lipid Profile: No results for input(s): CHOL, HDL, LDLCALC, TRIG, CHOLHDL, LDLDIRECT in the last 72 hours. Thyroid Function Tests: Recent Labs    06/14/20 0139  TSH 3.111   Anemia Panel: Recent Labs    06/14/20 0139  VITAMINB12 1,820*  FOLATE 20.7   Urine  analysis:    Component Value Date/Time   COLORURINE AMBER (A) 06/14/2020 0110   APPEARANCEUR HAZY (A) 06/14/2020 0110   LABSPEC 1.019 06/14/2020 0110   PHURINE 5.0 06/14/2020 0110   GLUCOSEU NEGATIVE 06/14/2020 0110   HGBUR MODERATE (A) 06/14/2020 0110   BILIRUBINUR NEGATIVE 06/14/2020 0110   KETONESUR NEGATIVE 06/14/2020 0110   PROTEINUR NEGATIVE 06/14/2020 0110   UROBILINOGEN 0.2 01/18/2014 0846   NITRITE NEGATIVE 06/14/2020 0110   LEUKOCYTESUR TRACE (A) 06/14/2020 0110   Sepsis Labs: @LABRCNTIP (procalcitonin:4,lacticidven:4)  ) Recent Results (from the past 240 hour(s))  SARS Coronavirus 2 by RT PCR (hospital order, performed in [place] hospital lab) Nasopharyngeal Nasopharyngeal Swab     Status: None   Collection Time: 06/13/20  7:23 PM   Specimen: Nasopharyngeal Swab  Result Value Ref Range Status   SARS Coronavirus 2 NEGATIVE NEGATIVE Final    Comment: (NOTE) SARS-CoV-2 target nucleic acids are NOT DETECTED.  The SARS-CoV-2 RNA is generally detectable in upper and lower respiratory  specimens during the acute phase of infection. The lowest concentration of SARS-CoV-2 viral copies this assay can detect is 250 copies / mL. A negative result does not preclude SARS-CoV-2 infection and should not be used as the sole basis for treatment or other patient management decisions.  A negative result may occur with improper specimen collection / handling, submission of specimen other than nasopharyngeal swab, presence of viral mutation(s) within the areas targeted by this assay, and inadequate number of viral copies (<250 copies / mL). A negative result must be combined with clinical observations, patient history, and epidemiological information.  Fact Sheet for Patients:   StrictlyIdeas.no  Fact Sheet for Healthcare Providers: BankingDealers.co.za  This test is not yet approved or  cleared by the [place] FDA and has been  authorized for detection and/or diagnosis of SARS-CoV-2 by FDA under an Emergency Use Authorization (EUA).  This EUA will remain in effect (meaning this test can be used) for the duration of the COVID-19 declaration under Section 564(b)(1) of the Act, 21 U.S.C. section 360bbb-3(b)(1), unless the authorization is terminated or revoked sooner.  Performed at [facility], [place] [address], [place], [place] [address]          Radiology Studies: CT Head Wo Contrast  Result Date: 06/13/2020 CLINICAL DATA:  Focal neuro deficit. Suspect stroke. Altered mental status, failure to thrive. EXAM: CT HEAD WITHOUT CONTRAST TECHNIQUE: Contiguous axial images were obtained from the base of the skull through the vertex without intravenous contrast. COMPARISON:  CT head 11/15/2018 FINDINGS: Brain: Progressive atrophy which is now moderate to severe. Chronic microvascular ischemic changes in the white matter. Negative for acute infarct, hemorrhage, mass Vascular: Extensive atherosclerotic calcification in the carotid and vertebral arteries bilaterally. Negative for hyperdense vessel. Skull: No focal skeletal lesion. Sinuses/Orbits: Paranasal sinuses clear. No orbital lesion. Bilateral cataract extraction. Other: None IMPRESSION: No acute abnormality Moderate to severe atrophy which has progressed since 2019. Chronic microvascular ischemic change in the white matter. Electronically Signed   By: Franchot Gallo M.D.   On: 06/13/2020 19:25   DG Chest Portable 1 View  Result Date: 06/13/2020 CLINICAL DATA:  Evaluate PICC line placement EXAM: PORTABLE CHEST 1 VIEW COMPARISON:  10/11/2018 FINDINGS: Cardiac enlargement. Previous median sternotomy and CABG procedure. Left chest wall AICD is noted with leads in the right atrial appendage and right ventricle. The left upper extremity PICC line tip is identified along the lateral margin of the radiograph within the left upper extremity. Small right pleural effusion.  Bilateral peripheral predominant opacities are identified within the right upper lobe, right midlung and right base as well as the left lung base. IMPRESSION: 1. The left upper extremity PICC line tip is identified along the lateral margin of the radiograph within the left upper extremity. Recommend repositioning. 2. Cardiac enlargement. 3. Small right pleural effusion. 4. Bilateral pulmonary opacities concerning for multifocal infection. Electronically Signed   By: Kerby Moors M.D.   On: 06/13/2020 17:26        Scheduled Meds: . aspirin  81 mg Oral Daily  . azithromycin  500 mg Oral Q24H  . chlorhexidine  15 mL Mouth Rinse BID  . divalproex  375 mg Oral TID  . famotidine  20 mg Oral Daily  . hydrALAZINE  10 mg Oral TID  . insulin aspart  0-9 Units Subcutaneous TID WC  . lidocaine  2 patch Transdermal Daily  . mouth rinse  15 mL Mouth Rinse q12n4p  . metoprolol succinate  12.5  mg Oral Daily  . sacubitril-valsartan  1 tablet Oral BID  . sodium chloride flush  3 mL Intravenous Q12H   Continuous Infusions: . sodium chloride    . cefTRIAXone (ROCEPHIN)  IV       LOS: 0 days    Time spent: 6min  Domenic Polite, MD [facility]  06/14/2020, 10:21 AM

## 2020-06-14 NOTE — Progress Notes (Signed)
PHARMACIST - PHYSICIAN COMMUNICATION DR:   [name] CONCERNING: Antibiotic IV to Oral Route Change Policy  RECOMMENDATION: This patient is receiving azithromycin by the intravenous route.  Based on criteria approved by the Pharmacy and Therapeutics Committee, the antibiotic(s) is/are being converted to the equivalent oral dose form(s).   DESCRIPTION: These criteria include:  Patient being treated for a respiratory tract infection, urinary tract infection, cellulitis or clostridium difficile associated diarrhea if on metronidazole  The patient is not neutropenic and does not exhibit a GI malabsorption state  The patient is eating (either orally or via tube) and/or has been taking other orally administered medications for a least 24 hours  The patient is improving clinically and has a Tmax < 100.5  If you have questions about this conversion, please contact the Pharmacy Department  []   [phone] )  [name] []   [phone] )  [name] [x]   [phone] )  [name] []   [phone] )  [facility] []   [phone] )  [place], PharmD., BCPS, [facility] Clinical Pharmacist

## 2020-06-15 LAB — CBC
HCT: 42.7 % (ref 36.0–46.0)
Hemoglobin: 13.1 g/dL (ref 12.0–15.0)
MCH: 33.6 pg (ref 26.0–34.0)
MCHC: 30.7 g/dL (ref 30.0–36.0)
MCV: 109.5 fL — ABNORMAL HIGH (ref 80.0–100.0)
Platelets: UNDETERMINED 10*3/uL (ref 150–400)
RBC: 3.9 MIL/uL (ref 3.87–5.11)
RDW: 17.3 % — ABNORMAL HIGH (ref 11.5–15.5)
WBC: 8.2 10*3/uL (ref 4.0–10.5)
nRBC: 0.5 % — ABNORMAL HIGH (ref 0.0–0.2)

## 2020-06-15 LAB — GLUCOSE, CAPILLARY
Glucose-Capillary: 146 mg/dL — ABNORMAL HIGH (ref 70–99)
Glucose-Capillary: 158 mg/dL — ABNORMAL HIGH (ref 70–99)
Glucose-Capillary: 149 mg/dL — ABNORMAL HIGH (ref 70–99)
Glucose-Capillary: 101 mg/dL — ABNORMAL HIGH (ref 70–99)

## 2020-06-15 LAB — BASIC METABOLIC PANEL
Anion gap: 14 (ref 5–15)
BUN: 27 mg/dL — ABNORMAL HIGH (ref 8–23)
CO2: 24 mmol/L (ref 22–32)
Calcium: 8.4 mg/dL — ABNORMAL LOW (ref 8.9–10.3)
Chloride: 116 mmol/L — ABNORMAL HIGH (ref 98–111)
Creatinine, Ser: 0.91 mg/dL (ref 0.44–1.00)
GFR calc Af Amer: >60 mL/min (ref >60)
GFR calc non Af Amer: 56 mL/min — ABNORMAL LOW (ref >60)
Glucose, Bld: 187 mg/dL — ABNORMAL HIGH (ref 70–99)
Potassium: 3.1 mmol/L — ABNORMAL LOW (ref 3.5–5.1)
Sodium: 154 mmol/L — ABNORMAL HIGH (ref 135–145)

## 2020-06-15 MED ORDER — ALBUTEROL SULFATE (2.5 MG/3ML) 0.083% IN NEBU
2.5000 mg | INHALATION_SOLUTION | RESPIRATORY_TRACT | Status: DC | PRN
  Administered 2020-06-15: 2.5 mg via RESPIRATORY_TRACT
  Filled 2020-06-15: qty 3

## 2020-06-15 MED ORDER — POTASSIUM CHLORIDE 10 MEQ/100ML IV SOLN
10.0000 meq | INTRAVENOUS | Status: AC
  Administered 2020-06-15 (×4): 10 meq via INTRAVENOUS
  Filled 2020-06-15 (×4): qty 100

## 2020-06-15 MED ORDER — POTASSIUM CHLORIDE CRYS ER 20 MEQ PO TBCR: 40.0000 meq | EXTENDED_RELEASE_TABLET | Freq: Once | ORAL | Status: DC

## 2020-06-15 NOTE — Progress Notes (Signed)
PROGRESS NOTE    [name]  ZOX:[id number] DOB: [birth year] DOA: 06/13/2020 PCP: Patrick Jupiter    Brief Narrative:  86 year old female with advanced dementia from SNF, she has a history of CAD, chronic systolic and diastolic CHF, EF of 81%, chronic kidney disease stage III, complete heart block status post pacer was sent to the ED from her nursing home with worsening mental status, decreased p.o. intake.  Reportedly at baseline is alert and oriented to self only, unable to ambulate, dependent on others for all ADLs, apparently stopped eating or drinking in the last few days, [place] is her guardian/representative [name]. [facility] case representative was contacted by her providers at [facility] on 6/10 with suggestions for hospice and comfort focused care, this was escalated up to her managers, still awaiting further discussions, in the meantime patient became more drowsy and lethargic and was sent to the emergency room.  She was also recently started on antibiotics for UTI and had a PICC line placed.  Work-up in the ED, noted normal vital signs, EKG with left bundle branch block, CT head showed advanced atrophy, no acute findings, chest x-ray noted cardiomegaly and possible bilateral opacities, his serum sodium was 159 and creatinine was 1.2. -She was started on IV fluids and antibiotics  Assessment & Plan:   Principal Problem:   Acute encephalopathy Active Problems:   CKD (chronic kidney disease), stage III (HCC)   PAF (paroxysmal atrial fibrillation) (HCC)   Coronary artery disease   Type II diabetes mellitus with renal manifestations (HCC)   Complete heart block (HCC)   Chronic combined systolic and diastolic CHF (congestive heart failure) (HCC)   Hypernatremia   Hypokalemia   Multifocal pneumonia   Encephalopathy   Pressure injury of skin  Acute metabolic encephalopathy Hypernatremia -Thought to be secondary to dehydration, hypernatremia in the  background of advanced dementia -Also concern for possible aspiration PNA, currently on IV ceftriaxone and azithromycin -Continue D5 water  Possible pneumonia -Suspect aspiration, dysphagia in the setting of advanced dementia -SLP consulted -Continue antibiotics as above, limited utility in the setting of all her comorbidities -Per staff, pt unable to tolerate PO intake safely, thus will need to continue IV medications  Advanced dementia -Bedbound, total care, oriented to self only at baseline -Now with ongoing failure to thrive -Hospice and comfort focused care is my recommendation, pt has a state guardian, Dr. [name] called patient's DSS representative [name] at [phone] and notified her regarding this.  She also received a request last week from her medical providers at [facility] recommending hospice. To f/u with DSS  Chronic combined systolic and diastolic CHF -Last echo with EF of 30 to 35% -Remains on Entresto and beta-blocker, although pt currently not able to tolerate PO safely this AM  Paroxysmal atrial fibrillation -Not on anticoagulation at baseline, continue beta-blocker  Stage IIIb chronic kidney disease -Stable  Type 2 diabetes mellitus -Not on meds,  -Continue SSI coverage as needed  DVT prophylaxis: SCD's Code Status: DNR Family Communication: Pt in room, family not at bedsie  Status is: Inpatient  Remains inpatient appropriate because:IV treatments appropriate due to intensity of illness or inability to take PO   Dispo: The patient is from: SNF              Anticipated d/c is to: SNF              Anticipated d/c date is: 2 days  Patient currently is not medically stable to d/c.       Consultants:   Palliative Care  Procedures:     Antimicrobials: Anti-infectives (From admission, onward)   Start     Dose/Rate Route Frequency Ordered Stop   06/14/20 2000  azithromycin (ZITHROMAX) 500 mg in sodium chloride 0.9 %  250 mL IVPB     Discontinue     500 mg 250 mL/hr over 60 Minutes Intravenous Every 24 hours 06/14/20 1454 06/17/20 1959   06/14/20 1815  cefTRIAXone (ROCEPHIN) 2 g in sodium chloride 0.9 % 100 mL IVPB     Discontinue     2 g 200 mL/hr over 30 Minutes Intravenous Every 24 hours 06/13/20 2336 06/18/20 1814   06/14/20 1815  azithromycin (ZITHROMAX) 500 mg in sodium chloride 0.9 % 250 mL IVPB  Status:  Discontinued        500 mg 250 mL/hr over 60 Minutes Intravenous Every 24 hours 06/13/20 2336 06/14/20 0741   06/14/20 1800  azithromycin (ZITHROMAX) tablet 500 mg  Status:  Discontinued        500 mg Oral Every 24 hours 06/14/20 0741 06/14/20 1454   06/13/20 1815  cefTRIAXone (ROCEPHIN) 1 g in sodium chloride 0.9 % 100 mL IVPB        1 g 200 mL/hr over 30 Minutes Intravenous  Once 06/13/20 1814 06/13/20 2018   06/13/20 1815  azithromycin (ZITHROMAX) 500 mg in sodium chloride 0.9 % 250 mL IVPB        500 mg 250 mL/hr over 60 Minutes Intravenous  Once 06/13/20 1814 06/13/20 2125       Subjective: Unable to obtain given dementia  Objective: Vitals:   06/15/20 0500 06/15/20 0749 06/15/20 1110 06/15/20 1256  BP:  (!) 146/68 136/76   Pulse:  70 68 67  Resp:  15 15 18   Temp:  97.8 F (36.6 C) 97.8 F (36.6 C)   TempSrc:  Axillary Axillary   SpO2:  99% 99% 100%  Weight: 43.8 kg     Height:        Intake/Output Summary (Last 24 hours) at 06/15/2020 1321 Last data filed at 06/15/2020 0816 Gross per 24 hour  Intake 1717.8 ml  Output 650 ml  Net 1067.8 ml   Filed Weights   06/13/20 1717 06/14/20 0500 06/15/20 0500  Weight: 58.1 kg 43.6 kg 43.8 kg    Examination:  General exam: Appears calm and comfortable  Respiratory system: Clear to auscultation. Respiratory effort normal. Cardiovascular system: S1 & S2 heard, Regular Gastrointestinal system: Abdomen is nondistended, soft and nontender. No organomegaly or masses felt. Normal bowel sounds heard. Central nervous system:  confused. No focal neurological deficits. Extremities: Symmetric 5 x 5 power. Skin: No rashes, lesions Psychiatry: Unable to assess given dementia  Data Reviewed: I have personally reviewed following labs and imaging studies  CBC: Recent Labs  Lab 06/13/20 1839 06/14/20 0139 06/15/20 0552  WBC 9.1 9.1 8.2  NEUTROABS 6.8  --   --   HGB 13.7 13.4 13.1  HCT 45.6 44.9 42.7  MCV 110.1* 110.0* 109.5*  PLT PLATELET CLUMPS NOTED ON SMEAR, COUNT APPEARS DECREASED PLATELET CLUMPS NOTED ON SMEAR, UNABLE TO ESTIMATE PLATELET CLUMPS NOTED ON SMEAR, UNABLE TO ESTIMATE   Basic Metabolic Panel: Recent Labs  Lab 06/13/20 1839 06/14/20 0139 06/15/20 0552  NA 159* 159* 154*  K 3.4* 3.6 3.1*  CL 122* 119* 116*  CO2 23 26 24   GLUCOSE 180* 166* 187*  BUN 46*  41* 27*  CREATININE 1.24* 1.10* 0.91  CALCIUM 9.0 8.8* 8.4*  MG  --  2.0  --    GFR: Estimated Creatinine Clearance: 28.4 mL/min (by C-G formula based on SCr of 0.91 mg/dL). Liver Function Tests: No results for input(s): AST, ALT, ALKPHOS, BILITOT, PROT, ALBUMIN in the last 168 hours. No results for input(s): LIPASE, AMYLASE in the last 168 hours. Recent Labs  Lab 06/14/20 0139  AMMONIA 40*   Coagulation Profile: No results for input(s): INR, PROTIME in the last 168 hours. Cardiac Enzymes: No results for input(s): CKTOTAL, CKMB, CKMBINDEX, TROPONINI in the last 168 hours. BNP (last 3 results) No results for input(s): PROBNP in the last 8760 hours. HbA1C: Recent Labs    06/14/20 0139  HGBA1C 5.9*   CBG: Recent Labs  Lab 06/14/20 1135 06/14/20 1542 06/14/20 2154 06/15/20 0738 06/15/20 1130  GLUCAP 132* 144* 129* 146* 158*   Lipid Profile: No results for input(s): CHOL, HDL, LDLCALC, TRIG, CHOLHDL, LDLDIRECT in the last 72 hours. Thyroid Function Tests: Recent Labs    06/14/20 0139  TSH 3.111   Anemia Panel: Recent Labs    06/14/20 0139  VITAMINB12 1,820*  FOLATE 20.7   Sepsis Labs: Recent Labs  Lab  06/14/20 0139  PROCALCITON <0.10    Recent Results (from the past 240 hour(s))  Blood culture (routine x 2)     Status: None (Preliminary result)   Collection Time: 06/13/20  6:35 PM   Specimen: BLOOD RIGHT HAND  Result Value Ref Range Status   Specimen Description BLOOD RIGHT HAND  Final   Special Requests   Final    BOTTLES DRAWN AEROBIC AND ANAEROBIC Blood Culture results may not be optimal due to an inadequate volume of blood received in culture bottles   Culture   Final    NO GROWTH 2 DAYS Performed at [facility], [place] [address], [place], [place] [address]    Report Status PENDING  Incomplete  SARS Coronavirus 2 by RT PCR (hospital order, performed in [facility] hospital lab) Nasopharyngeal Nasopharyngeal Swab     Status: None   Collection Time: 06/13/20  7:23 PM   Specimen: Nasopharyngeal Swab  Result Value Ref Range Status   SARS Coronavirus 2 NEGATIVE NEGATIVE Final    Comment: (NOTE) SARS-CoV-2 target nucleic acids are NOT DETECTED.  The SARS-CoV-2 RNA is generally detectable in upper and lower respiratory specimens during the acute phase of infection. The lowest concentration of SARS-CoV-2 viral copies this assay can detect is 250 copies / mL. A negative result does not preclude SARS-CoV-2 infection and should not be used as the sole basis for treatment or other patient management decisions.  A negative result may occur with improper specimen collection / handling, submission of specimen other than nasopharyngeal swab, presence of viral mutation(s) within the areas targeted by this assay, and inadequate number of viral copies (<250 copies / mL). A negative result must be combined with clinical observations, patient history, and epidemiological information.  Fact Sheet for Patients:   StrictlyIdeas.no  Fact Sheet for Healthcare Providers: BankingDealers.co.za  This test is not yet approved or  cleared by the  [place] FDA and has been authorized for detection and/or diagnosis of SARS-CoV-2 by FDA under an Emergency Use Authorization (EUA).  This EUA will remain in effect (meaning this test can be used) for the duration of the COVID-19 declaration under Section 564(b)(1) of the Act, 21 U.S.C. section 360bbb-3(b)(1), unless the authorization is terminated or revoked  sooner.  Performed at [facility], [place] [address], [place], [place] [address]   Blood culture (routine x 2)     Status: None (Preliminary result)   Collection Time: 06/14/20  1:39 AM   Specimen: BLOOD  Result Value Ref Range Status   Specimen Description BLOOD RIGHT ANTECUBITAL  Final   Special Requests   Final    BOTTLES DRAWN AEROBIC AND ANAEROBIC Blood Culture adequate volume   Culture   Final    NO GROWTH 1 DAY Performed at [facility], [place] [address], [place], [place] [address]    Report Status PENDING  Incomplete     Radiology Studies: CT Head Wo Contrast  Result Date: 06/13/2020 CLINICAL DATA:  Focal neuro deficit. Suspect stroke. Altered mental status, failure to thrive. EXAM: CT HEAD WITHOUT CONTRAST TECHNIQUE: Contiguous axial images were obtained from the base of the skull through the vertex without intravenous contrast. COMPARISON:  CT head 11/15/2018 FINDINGS: Brain: Progressive atrophy which is now moderate to severe. Chronic microvascular ischemic changes in the white matter. Negative for acute infarct, hemorrhage, mass Vascular: Extensive atherosclerotic calcification in the carotid and vertebral arteries bilaterally. Negative for hyperdense vessel. Skull: No focal skeletal lesion. Sinuses/Orbits: Paranasal sinuses clear. No orbital lesion. Bilateral cataract extraction. Other: None IMPRESSION: No acute abnormality Moderate to severe atrophy which has progressed since 2019. Chronic microvascular ischemic change in the white matter. Electronically Signed   By: Franchot Gallo M.D.   On: 06/13/2020  19:25   DG Chest Portable 1 View  Result Date: 06/13/2020 CLINICAL DATA:  Evaluate PICC line placement EXAM: PORTABLE CHEST 1 VIEW COMPARISON:  10/11/2018 FINDINGS: Cardiac enlargement. Previous median sternotomy and CABG procedure. Left chest wall AICD is noted with leads in the right atrial appendage and right ventricle. The left upper extremity PICC line tip is identified along the lateral margin of the radiograph within the left upper extremity. Small right pleural effusion. Bilateral peripheral predominant opacities are identified within the right upper lobe, right midlung and right base as well as the left lung base. IMPRESSION: 1. The left upper extremity PICC line tip is identified along the lateral margin of the radiograph within the left upper extremity. Recommend repositioning. 2. Cardiac enlargement. 3. Small right pleural effusion. 4. Bilateral pulmonary opacities concerning for multifocal infection. Electronically Signed   By: Kerby Moors M.D.   On: 06/13/2020 17:26    Scheduled Meds: . aspirin  81 mg Oral Daily  . chlorhexidine  15 mL Mouth Rinse BID  . divalproex  375 mg Oral TID  . famotidine  20 mg Oral Daily  . insulin aspart  0-9 Units Subcutaneous TID WC  . lidocaine  2 patch Transdermal Daily  . mouth rinse  15 mL Mouth Rinse q12n4p  . metoprolol succinate  12.5 mg Oral Daily  . sacubitril-valsartan  1 tablet Oral BID  . sodium chloride flush  3 mL Intravenous Q12H   Continuous Infusions: . sodium chloride    . azithromycin (ZITHROMAX) 500 MG IVPB (Vial-Mate [employer]) 500 mg (06/14/20 2025)  . cefTRIAXone (ROCEPHIN)  IV 2 g (06/14/20 1812)  . dextrose 75 mL/hr at 06/15/20 1219     LOS: 1 day   Marylu Lund, MD [facility] Pager On Amion  If 7PM-7AM, please contact night-coverage 06/15/2020, 1:21 PM

## 2020-06-15 NOTE — Progress Notes (Signed)
  Speech Language Pathology Treatment: Dysphagia  Patient Details Name: [name] MRN: [id number] DOB: August 13, 1928 Today's Date: 06/15/2020 Time: 1430-1440 SLP Time Calculation (min) (ACUTE ONLY): 10 min  Assessment / Plan / Recommendation Clinical Impression  Pt was a little more alert today but still took minimal POs before declining further intake by closing her lips, not exhibiting any awareness of boluses. With what little she did take, there were multiple, audible swallows and prolonged, delayed throat clearing that was wet sounding. This is concerning for dysphagia with potential for reduced airway protection in the setting of baseline esophageal dysphagia and cognitive impairment and the concern for PNA on CXR. Would focus on oral care as pt will allow with consideration for comfort feeds if within [place].    HPI HPI: Pt is a 86 yo female admitted from SNF with AMS; CXR concerning for PNA. She had reportedly stopped eating and drinking over the several days leading up to admission. MBS in 2016 with minimal pharyngeal dysphagia but esophagram revealed severe esophageal dysmotility, likely presbyesophagus. PMH includes: advanced dementia, DM, CVA, afib, osteoporosis, HTN, HLD, CAD, CKD, CHF      SLP Plan  Continue with current plan of care       Recommendations  Diet recommendations: NPO (unless shifting to full comfort care) Medication Administration: Via alternative means                Oral Care Recommendations: Oral care QID Follow up Recommendations: Skilled Nursing facility SLP Visit Diagnosis: Dysphagia, unspecified (R13.10) Plan: Continue with current plan of care       GO                Osie Bond., M.A. [facility] Pager [phone] Office [phone]  06/15/2020, 2:49 PM

## 2020-06-15 NOTE — Progress Notes (Signed)
Patient has new onset of expiratory wheezing, O2 sat is at 97% on 2L of oxygen. Paged MD for any breathing treatment orders.

## 2020-06-15 NOTE — Patient Care Conference (Signed)
Tried calling DSS guardian for update. No answer. Will try again later

## 2020-06-16 LAB — BASIC METABOLIC PANEL
Anion gap: 12 (ref 5–15)
BUN: 13 mg/dL (ref 8–23)
CO2: 24 mmol/L (ref 22–32)
Calcium: 8.4 mg/dL — ABNORMAL LOW (ref 8.9–10.3)
Chloride: 110 mmol/L (ref 98–111)
Creatinine, Ser: 0.74 mg/dL (ref 0.44–1.00)
GFR calc Af Amer: >60 mL/min (ref >60)
GFR calc non Af Amer: >60 mL/min (ref >60)
Glucose, Bld: 165 mg/dL — ABNORMAL HIGH (ref 70–99)
Potassium: 3.4 mmol/L — ABNORMAL LOW (ref 3.5–5.1)
Sodium: 146 mmol/L — ABNORMAL HIGH (ref 135–145)

## 2020-06-16 LAB — CBC
HCT: 38.9 % (ref 36.0–46.0)
Hemoglobin: 12.1 g/dL (ref 12.0–15.0)
MCH: 34.8 pg — ABNORMAL HIGH (ref 26.0–34.0)
MCHC: 31.1 g/dL (ref 30.0–36.0)
MCV: 111.8 fL — ABNORMAL HIGH (ref 80.0–100.0)
Platelets: 70 10*3/uL — ABNORMAL LOW (ref 150–400)
RBC: 3.48 MIL/uL — ABNORMAL LOW (ref 3.87–5.11)
RDW: 17.3 % — ABNORMAL HIGH (ref 11.5–15.5)
WBC: 7.1 10*3/uL (ref 4.0–10.5)
nRBC: 0.3 % — ABNORMAL HIGH (ref 0.0–0.2)

## 2020-06-16 LAB — GLUCOSE, CAPILLARY
Glucose-Capillary: 121 mg/dL — ABNORMAL HIGH (ref 70–99)
Glucose-Capillary: 129 mg/dL — ABNORMAL HIGH (ref 70–99)
Glucose-Capillary: 133 mg/dL — ABNORMAL HIGH (ref 70–99)
Glucose-Capillary: 151 mg/dL — ABNORMAL HIGH (ref 70–99)
Glucose-Capillary: 175 mg/dL — ABNORMAL HIGH (ref 70–99)

## 2020-06-16 MED ORDER — POTASSIUM CHLORIDE 10 MEQ/100ML IV SOLN
10.0000 meq | INTRAVENOUS | Status: AC
  Administered 2020-06-16 (×2): 10 meq via INTRAVENOUS

## 2020-06-16 MED ORDER — PANTOPRAZOLE SODIUM 40 MG IV SOLR
40.0000 mg | Freq: Every day | INTRAVENOUS | Status: DC
  Administered 2020-06-16 – 2020-06-21 (×6): 40 mg via INTRAVENOUS
  Filled 2020-06-16 (×6): qty 40

## 2020-06-16 MED ORDER — METOPROLOL TARTRATE 5 MG/5ML IV SOLN
2.5000 mg | Freq: Four times a day (QID) | INTRAVENOUS | Status: DC
  Administered 2020-06-16 – 2020-06-21 (×17): 2.5 mg via INTRAVENOUS
  Filled 2020-06-16 (×15): qty 5

## 2020-06-16 MED ORDER — POTASSIUM CHLORIDE 10 MEQ/100ML IV SOLN
10.0000 meq | INTRAVENOUS | Status: AC
  Administered 2020-06-16 (×3): 10 meq via INTRAVENOUS
  Filled 2020-06-16 (×4): qty 100

## 2020-06-16 NOTE — Progress Notes (Signed)
PROGRESS NOTE    [name]  OIZ:[id number] DOB: 31-May-1928 DOA: 06/13/2020 PCP: Patrick Jupiter    Brief Narrative:  86 year old female with advanced dementia from SNF, she has a history of CAD, chronic systolic and diastolic CHF, EF of 33%, chronic kidney disease stage III, complete heart block status post pacer was sent to the ED from her nursing home with worsening mental status, decreased p.o. intake.  Reportedly at baseline is alert and oriented to self only, unable to ambulate, dependent on others for all ADLs, apparently stopped eating or drinking in the last few days, Jacona is her guardian/representative [name]. [facility] case representative was contacted by her providers at [facility] on 6/10 with suggestions for hospice and comfort focused care, this was escalated up to her managers, still awaiting further discussions, in the meantime patient became more drowsy and lethargic and was sent to the emergency room.  She was also recently started on antibiotics for UTI and had a PICC line placed.  Work-up in the ED, noted normal vital signs, EKG with left bundle branch block, CT head showed advanced atrophy, no acute findings, chest x-ray noted cardiomegaly and possible bilateral opacities, his serum sodium was 159 and creatinine was 1.2. -She was started on IV fluids and antibiotics  Assessment & Plan:   Principal Problem:   Acute encephalopathy Active Problems:   CKD (chronic kidney disease), stage III (HCC)   PAF (paroxysmal atrial fibrillation) (HCC)   Coronary artery disease   Type II diabetes mellitus with renal manifestations (HCC)   Complete heart block (HCC)   Chronic combined systolic and diastolic CHF (congestive heart failure) (HCC)   Hypernatremia   Hypokalemia   Multifocal pneumonia   Encephalopathy   Pressure injury of skin  Acute metabolic encephalopathy Hypernatremia -Thought to be secondary to dehydration, hypernatremia in the  background of advanced dementia -Also concern for possible aspiration PNA, pt had been continued on IV ceftriaxone and azithromycin, to complete course today -Patient had been continued on D5 water -Mentation remains largely unchanged despite improvement in sodium level -Overall prognosis is very poor  Possible pneumonia -Suspect aspiration, dysphagia in the setting of advanced dementia -SLP consulted, unsafe to swallow with NPO recommended -Per staff, pt unable to tolerate PO intake safely, thus has continued IV medications. IV abx to complete today  Advanced dementia -Bedbound, total care, oriented to self only at baseline -Now with ongoing failure to thrive -No significant improvement and patient remains unable to tolerate PO safely. Long term prognosis is very poor. Have called and updated POA [name] at [phone] as well as supervisor Margretta Sidle at [phone]. All are in agreement that outlook is grim at this point and that a transition to hospice and comfort focused care would be indicated. -Prognosis is anticipated to be less than 2 weeks -Have placed TOC consult for residential hospice  Chronic combined systolic and diastolic CHF -Last echo with EF of 30 to 35% -Had remained on Entresto and beta-blocker, although pt currently not able to tolerate PO safely this AM  Paroxysmal atrial fibrillation -Not on anticoagulation at baseline, continued on beta-blocker  Stage IIIb chronic kidney disease -Renal function had remained stable  Type 2 diabetes mellitus -Not on meds,  -Continue SSI coverage as needed  DVT prophylaxis: SCD's Code Status: DNR Family Communication: Pt in room, family not at bedside. Have updated DSS caregivers as per above  Status is: Inpatient  Remains inpatient appropriate because:Unsafe d/c plan   Dispo:  The patient is from: SNF              Anticipated d/c is to: Residential Hospice              Anticipated d/c date is: 1  day              Patient currently is medically stable to d/c., Just pending on residential hospice bed availability  Consultants:   Palliative Care  Procedures:     Antimicrobials: Anti-infectives (From admission, onward)   Start     Dose/Rate Route Frequency Ordered Stop   06/14/20 2000  azithromycin (ZITHROMAX) 500 mg in sodium chloride 0.9 % 250 mL IVPB     Discontinue     500 mg 250 mL/hr over 60 Minutes Intravenous Every 24 hours 06/14/20 1454 06/17/20 1959   06/14/20 1815  cefTRIAXone (ROCEPHIN) 2 g in sodium chloride 0.9 % 100 mL IVPB     Discontinue     2 g 200 mL/hr over 30 Minutes Intravenous Every 24 hours 06/13/20 2336 06/18/20 1814   06/14/20 1815  azithromycin (ZITHROMAX) 500 mg in sodium chloride 0.9 % 250 mL IVPB  Status:  Discontinued        500 mg 250 mL/hr over 60 Minutes Intravenous Every 24 hours 06/13/20 2336 06/14/20 0741   06/14/20 1800  azithromycin (ZITHROMAX) tablet 500 mg  Status:  Discontinued        500 mg Oral Every 24 hours 06/14/20 0741 06/14/20 1454   06/13/20 1815  cefTRIAXone (ROCEPHIN) 1 g in sodium chloride 0.9 % 100 mL IVPB        1 g 200 mL/hr over 30 Minutes Intravenous  Once 06/13/20 1814 06/13/20 2018   06/13/20 1815  azithromycin (ZITHROMAX) 500 mg in sodium chloride 0.9 % 250 mL IVPB        500 mg 250 mL/hr over 60 Minutes Intravenous  Once 06/13/20 1814 06/13/20 2125      Subjective: Cannot obtain given dementia  Objective: Vitals:   06/15/20 2317 06/16/20 0319 06/16/20 0725 06/16/20 1119  BP: 125/63 130/72 127/70 117/73  Pulse: 68 72 74 77  Resp: 16  18 17   Temp: 98.1 F (36.7 C) 97.8 F (36.6 C) 98.3 F (36.8 C) 98 F (36.7 C)  TempSrc:  Oral Oral Axillary  SpO2: 99% 98% 93% 93%  Weight:      Height:        Intake/Output Summary (Last 24 hours) at 06/16/2020 1558 Last data filed at 06/16/2020 0901 Gross per 24 hour  Intake 10 ml  Output 150 ml  Net -140 ml   Filed Weights   06/13/20 1717 06/14/20 0500  06/15/20 0500  Weight: 58.1 kg 43.6 kg 43.8 kg    Examination: General exam: asleep, difficult to arouse, laying in bed, in nad Respiratory system: slightly increased resp effort, no wheezing Cardiovascular system: regular rate, s1, s2 Gastrointestinal system: Soft, nondistended, positive BS Central nervous system: CN2-12 grossly intact, no tremors Extremities: Perfused, no clubbing Skin: Normal skin turgor, no notable skin lesions seen Psychiatry: Unable to assess given mentation  Data Reviewed: I have personally reviewed following labs and imaging studies  CBC: Recent Labs  Lab 06/13/20 1839 06/14/20 0139 06/15/20 0552 06/16/20 0652  WBC 9.1 9.1 8.2 7.1  NEUTROABS 6.8  --   --   --   HGB 13.7 13.4 13.1 12.1  HCT 45.6 44.9 42.7 38.9  MCV 110.1* 110.0* 109.5* 111.8*  PLT PLATELET CLUMPS NOTED ON SMEAR,  COUNT APPEARS DECREASED PLATELET CLUMPS NOTED ON SMEAR, UNABLE TO ESTIMATE PLATELET CLUMPS NOTED ON SMEAR, UNABLE TO ESTIMATE 70*   Basic Metabolic Panel: Recent Labs  Lab 06/13/20 1839 06/14/20 0139 06/15/20 0552 06/16/20 0652  NA 159* 159* 154* 146*  K 3.4* 3.6 3.1* 3.4*  CL 122* 119* 116* 110  CO2 23 26 24 24   GLUCOSE 180* 166* 187* 165*  BUN 46* 41* 27* 13  CREATININE 1.24* 1.10* 0.91 0.74  CALCIUM 9.0 8.8* 8.4* 8.4*  MG  --  2.0  --   --    GFR: Estimated Creatinine Clearance: 32.3 mL/min (by C-G formula based on SCr of 0.74 mg/dL). Liver Function Tests: No results for input(s): AST, ALT, ALKPHOS, BILITOT, PROT, ALBUMIN in the last 168 hours. No results for input(s): LIPASE, AMYLASE in the last 168 hours. Recent Labs  Lab 06/14/20 0139  AMMONIA 40*   Coagulation Profile: No results for input(s): INR, PROTIME in the last 168 hours. Cardiac Enzymes: No results for input(s): CKTOTAL, CKMB, CKMBINDEX, TROPONINI in the last 168 hours. BNP (last 3 results) No results for input(s): PROBNP in the last 8760 hours. HbA1C: Recent Labs    06/14/20 0139   HGBA1C 5.9*   CBG: Recent Labs  Lab 06/15/20 1130 06/15/20 1627 06/15/20 2137 06/16/20 0726 06/16/20 1117  GLUCAP 158* 149* 101* 175* 151*   Lipid Profile: No results for input(s): CHOL, HDL, LDLCALC, TRIG, CHOLHDL, LDLDIRECT in the last 72 hours. Thyroid Function Tests: Recent Labs    06/14/20 0139  TSH 3.111   Anemia Panel: Recent Labs    06/14/20 0139  VITAMINB12 1,820*  FOLATE 20.7   Sepsis Labs: Recent Labs  Lab 06/14/20 0139  PROCALCITON <0.10    Recent Results (from the past 240 hour(s))  Blood culture (routine x 2)     Status: None (Preliminary result)   Collection Time: 06/13/20  6:35 PM   Specimen: BLOOD RIGHT HAND  Result Value Ref Range Status   Specimen Description BLOOD RIGHT HAND  Final   Special Requests   Final    BOTTLES DRAWN AEROBIC AND ANAEROBIC Blood Culture results may not be optimal due to an inadequate volume of blood received in culture bottles   Culture   Final    NO GROWTH 3 DAYS Performed at [facility], [place] [address], [place], [place] [address]    Report Status PENDING  Incomplete  SARS Coronavirus 2 by RT PCR (hospital order, performed in [facility] hospital lab) Nasopharyngeal Nasopharyngeal Swab     Status: None   Collection Time: 06/13/20  7:23 PM   Specimen: Nasopharyngeal Swab  Result Value Ref Range Status   SARS Coronavirus 2 NEGATIVE NEGATIVE Final    Comment: (NOTE) SARS-CoV-2 target nucleic acids are NOT DETECTED.  The SARS-CoV-2 RNA is generally detectable in upper and lower respiratory specimens during the acute phase of infection. The lowest concentration of SARS-CoV-2 viral copies this assay can detect is 250 copies / mL. A negative result does not preclude SARS-CoV-2 infection and should not be used as the sole basis for treatment or other patient management decisions.  A negative result may occur with improper specimen collection / handling, submission of specimen other than nasopharyngeal swab,  presence of viral mutation(s) within the areas targeted by this assay, and inadequate number of viral copies (<250 copies / mL). A negative result must be combined with clinical observations, patient history, and epidemiological information.  Fact Sheet for Patients:   StrictlyIdeas.no  Fact Sheet for Healthcare Providers: BankingDealers.co.za  This test is not yet approved or  cleared by the [place] FDA and has been authorized for detection and/or diagnosis of SARS-CoV-2 by FDA under an Emergency Use Authorization (EUA).  This EUA will remain in effect (meaning this test can be used) for the duration of the COVID-19 declaration under Section 564(b)(1) of the Act, 21 U.S.C. section 360bbb-3(b)(1), unless the authorization is terminated or revoked sooner.  Performed at [facility], [place] [address], [place], [place] [address]   Blood culture (routine x 2)     Status: None (Preliminary result)   Collection Time: 06/14/20  1:39 AM   Specimen: BLOOD  Result Value Ref Range Status   Specimen Description BLOOD RIGHT ANTECUBITAL  Final   Special Requests   Final    BOTTLES DRAWN AEROBIC AND ANAEROBIC Blood Culture adequate volume   Culture   Final    NO GROWTH 2 DAYS Performed at [facility], [place] [address], [place], [place] [address]    Report Status PENDING  Incomplete     Radiology Studies: No results found.  Scheduled Meds: . chlorhexidine  15 mL Mouth Rinse BID  . insulin aspart  0-9 Units Subcutaneous TID WC  . lidocaine  2 patch Transdermal Daily  . mouth rinse  15 mL Mouth Rinse q12n4p  . metoprolol tartrate  2.5 mg Intravenous Q6H  . pantoprazole (PROTONIX) IV  40 mg Intravenous Daily  . sodium chloride flush  3 mL Intravenous Q12H   Continuous Infusions: . sodium chloride    . azithromycin (ZITHROMAX) 500 MG IVPB (Vial-Mate [employer]) 500 mg (06/15/20 1928)  . cefTRIAXone (ROCEPHIN)  IV 2 g (06/15/20  1726)  . dextrose 75 mL/hr at 06/16/20 1350  . potassium chloride 10 mEq (06/16/20 1558)     LOS: 2 days   Marylu Lund, MD [facility] Pager On Amion  If 7PM-7AM, please contact night-coverage 06/16/2020, 3:58 PM

## 2020-06-17 DIAGNOSIS — I442 Atrioventricular block, complete: Secondary | ICD-10-CM

## 2020-06-17 LAB — GLUCOSE, CAPILLARY
Glucose-Capillary: 143 mg/dL — ABNORMAL HIGH (ref 70–99)
Glucose-Capillary: 155 mg/dL — ABNORMAL HIGH (ref 70–99)
Glucose-Capillary: 137 mg/dL — ABNORMAL HIGH (ref 70–99)
Glucose-Capillary: 86 mg/dL (ref 70–99)
Glucose-Capillary: 112 mg/dL — ABNORMAL HIGH (ref 70–99)

## 2020-06-17 NOTE — Progress Notes (Signed)
SLP Cancellation Note  Patient Details Name: [name] MRN: [id number] DOB: 1928/02/28   Cancelled treatment:       Reason Eval/Treat Not Completed: Other (comment) Per chart, pt is transitioning to comfort care, with team pursuing residential hospice. SLP to sign off at this time. Please reorder if needed.   Osie Bond., M.A. [place] [facility] Pager [phone] Office [phone]  06/17/2020, 3:24 PM

## 2020-06-17 NOTE — Progress Notes (Signed)
Engineer, maintenance [facility]) Hospital Liaison note.     Received request from [place], [place]  interest in [facility]. [place] is unable to offer a room today. Hospital Liaison will follow up tomorrow or sooner if a room becomes available.     A Please do not hesitate to call with questions.     Thank you,     Farrel Gordon, RN, [facility]       [place] (listed on AMION under [place])     [phone]

## 2020-06-17 NOTE — TOC Initial Note (Signed)
Transition of Care [facility]) - Initial/Assessment Note    Patient Details  Name: [name] MRN: [id number] Date of Birth: 1928-07-19  Transition of [facility]) CM/SW Contact:    Vinie Sill, [place] Phone Number: 06/17/2020, 2:41 PM  Clinical Narrative:                  CSW confirmed with DSS Guardian [name], DSS is agreeable to Hospice placement. Preference is [facility]. CSW made referral to  [facility] w/ M. Alford [place]. Audrea Muscat states sending referral for review, however no bed were available today.   Thurmond Butts, MSW, [place] Clinical Social Worker   Expected Discharge Plan: [place] Barriers to Discharge:  (Hospice Bed availability)   Patient Goals and CMS Choice        Expected Discharge Plan and Services Expected Discharge Plan: [place] In-house Referral: Clinical Social Work, Hospice / [place]                                            Prior Living Arrangements/Services     Patient language and need for interpreter reviewed:: No        Need for Family Participation in Patient Care: Yes (Comment) Care giver support system in place?: Yes (comment)   Criminal Activity/Legal Involvement Pertinent to Current Situation/Hospitalization: No - Comment as needed  Activities of Daily Living      Permission Sought/Granted Permission sought to share information with : Guardian Permission granted to share information with : Yes, Verbal Permission Granted  Share Information with NAME: [name]     Permission granted to share info w Relationship: DSS Guardian  Permission granted to share info w Contact Information: [phone] or [phone]  Emotional Assessment              Admission diagnosis:  Acute encephalopathy [G93.40] Encephalopathy [G93.40] Patient Active Problem List   Diagnosis Date Noted  . Encephalopathy 06/14/2020  . Pressure injury of skin 06/14/2020  .  Acute encephalopathy 06/13/2020  . Hypernatremia   . Hypokalemia   . Multifocal pneumonia   . Status post mitral valve repair 09/22/2018  . S/P CABG (coronary artery bypass graft) 09/22/2018  . Status post placement of cardiac pacemaker 09/22/2018  . Closed left hip fracture ([place]) 08/19/2018  . Macrocytic anemia 08/19/2018  . DCM (dilated cardiomyopathy) ([place]) 02/20/2018  . Heart murmur 05/14/2016  . Fall 11/17/2015  . Scalp laceration 11/17/2015  . Facial contusion 11/17/2015  . Rib fracture 11/16/2015  . Paresthesia of both hands 01/18/2014  . Renal insufficiency 01/18/2014  . Lower extremity weakness 01/18/2014  . Coronary artery disease   . Type II diabetes mellitus with renal manifestations ([place])   . Hyperlipidemia   . Carotid stenosis   . Complete heart block ([place])   . Severe mitral regurgitation   . Chronic combined systolic and diastolic CHF (congestive heart failure) ([place])   . PAF (paroxysmal atrial fibrillation) ([place]) 10/29/2013  . 2831 lead 07/21/2013  . CKD (chronic kidney disease), stage III ([place])   . Hypertension 04/30/2011   PCP:  Place, [place]:  No Pharmacies Listed    Social Determinants of Health (SDOH) Interventions    Readmission Risk Interventions No flowsheet data found.

## 2020-06-17 NOTE — Progress Notes (Signed)
PROGRESS NOTE    [name]  KZS:[id number] DOB: January 02, 1928 DOA: 06/13/2020 PCP: Patrick Jupiter    Brief Narrative:  86 year old female with advanced dementia from SNF, she has a history of CAD, chronic systolic and diastolic CHF, EF of 73%, chronic kidney disease stage III, complete heart block status post pacer was sent to the ED from her nursing home with worsening mental status, decreased p.o. intake.  Reportedly at baseline is alert and oriented to self only, unable to ambulate, dependent on others for all ADLs, apparently stopped eating or drinking in the last few days, [place] is her guardian/representative [name]. [facility] case representative was contacted by her providers at [facility] on 6/10 with suggestions for hospice and comfort focused care, this was escalated up to her managers, still awaiting further discussions, in the meantime patient became more drowsy and lethargic and was sent to the emergency room.  She was also recently started on antibiotics for UTI and had a PICC line placed.  Work-up in the ED, noted normal vital signs, EKG with left bundle branch block, CT head showed advanced atrophy, no acute findings, chest x-ray noted cardiomegaly and possible bilateral opacities, his serum sodium was 159 and creatinine was 1.2. -She was started on IV fluids and antibiotics  Assessment & Plan:   Principal Problem:   Acute encephalopathy Active Problems:   CKD (chronic kidney disease), stage III (HCC)   PAF (paroxysmal atrial fibrillation) (HCC)   Coronary artery disease   Type II diabetes mellitus with renal manifestations (HCC)   Complete heart block (HCC)   Chronic combined systolic and diastolic CHF (congestive heart failure) (HCC)   Hypernatremia   Hypokalemia   Multifocal pneumonia   Encephalopathy   Pressure injury of skin  Acute metabolic encephalopathy Hypernatremia -Thought to be secondary to dehydration, hypernatremia in the  background of advanced dementia -Also concern for possible aspiration PNA, pt had been continued on IV ceftriaxone and azithromycin, to complete course today -Patient had been continued on D5 water -Mentation remains largely unchanged despite improvement in sodium level -Overall prognosis is very poor  Possible pneumonia -Suspect aspiration, dysphagia in the setting of advanced dementia -SLP consulted, unsafe to swallow with NPO recommended -completed course of IV abx  Advanced dementia -Bedbound, total care, oriented to self only at baseline -Now with ongoing failure to thrive -No significant improvement and patient remains unable to tolerate PO safely. Long term prognosis is very poor. Had updated DSS POA [name] at [phone] as well as supervisor Margretta Sidle at [phone]. All are in agreement that outlook is grim at this point and that a transition to hospice and comfort focused care would be indicated. -Prognosis is anticipated to be less than 2 weeks -Have placed [facility] consult for residential hospice, pending  Chronic combined systolic and diastolic CHF -Last echo with EF of 30 to 35% -Had remained on Entresto and beta-blocker, although pt currently not able to tolerate PO safely this AM  Paroxysmal atrial fibrillation -Not on anticoagulation at baseline, continued on beta-blocker  Stage IIIb chronic kidney disease -Renal function had remained stable  Type 2 diabetes mellitus -Not on meds,  -Continue SSI coverage as needed  DVT prophylaxis: SCD's Code Status: DNR Family Communication: Pt in room, family not at bedside. Have updated DSS caregivers as per above  Status is: Inpatient  Remains inpatient appropriate because:Unsafe d/c plan   Dispo: The patient is from: SNF  Anticipated d/c is to: Residential Hospice              Anticipated d/c date is: when bed becomes available              Patient currently is medically stable to d/c.,  Just pending on residential hospice bed availability  Consultants:   Palliative Care  Procedures:     Antimicrobials: Anti-infectives (From admission, onward)   Start     Dose/Rate Route Frequency Ordered Stop   06/14/20 2000  azithromycin (ZITHROMAX) 500 mg in sodium chloride 0.9 % 250 mL IVPB        500 mg 250 mL/hr over 60 Minutes Intravenous Every 24 hours 06/14/20 1454 06/16/20 2245   06/14/20 1815  cefTRIAXone (ROCEPHIN) 2 g in sodium chloride 0.9 % 100 mL IVPB  Status:  Discontinued        2 g 200 mL/hr over 30 Minutes Intravenous Every 24 hours 06/13/20 2336 06/17/20 0840   06/14/20 1815  azithromycin (ZITHROMAX) 500 mg in sodium chloride 0.9 % 250 mL IVPB  Status:  Discontinued        500 mg 250 mL/hr over 60 Minutes Intravenous Every 24 hours 06/13/20 2336 06/14/20 0741   06/14/20 1800  azithromycin (ZITHROMAX) tablet 500 mg  Status:  Discontinued        500 mg Oral Every 24 hours 06/14/20 0741 06/14/20 1454   06/13/20 1815  cefTRIAXone (ROCEPHIN) 1 g in sodium chloride 0.9 % 100 mL IVPB        1 g 200 mL/hr over 30 Minutes Intravenous  Once 06/13/20 1814 06/13/20 2018   06/13/20 1815  azithromycin (ZITHROMAX) 500 mg in sodium chloride 0.9 % 250 mL IVPB        500 mg 250 mL/hr over 60 Minutes Intravenous  Once 06/13/20 1814 06/13/20 2125      Subjective: Difficult to obtain given dementia  Objective: Vitals:   06/17/20 0011 06/17/20 0336 06/17/20 0824 06/17/20 1124  BP: 90/74 124/77 102/81 112/75  Pulse: 82 67 66 68  Resp: (!) 22 (!) 22 19 19   Temp: 98.4 F (36.9 C) 98 F (36.7 C) 97.7 F (36.5 C) (!) 97.2 F (36.2 C)  TempSrc: Axillary Axillary Axillary Rectal  SpO2:  100% 99% 98%  Weight:      Height:        Intake/Output Summary (Last 24 hours) at 06/17/2020 1446 Last data filed at 06/17/2020 1414 Gross per 24 hour  Intake 0 ml  Output 650 ml  Net -650 ml   Filed Weights   06/13/20 1717 06/14/20 0500 06/15/20 0500  Weight: 58.1 kg 43.6 kg 43.8  kg    Examination: General exam: Awake, laying in bed, in nad Respiratory system: Normal respiratory effort, no wheezing Cardiovascular system: regular rate, s1, s2 Gastrointestinal system: Soft, nondistended, positive BS Central nervous system: CN2-12 grossly intact, strength intact Extremities: Perfused, no clubbing Skin: Normal skin turgor, no notable skin lesions seen Psychiatry: Unable to assess given mentation  Data Reviewed: I have personally reviewed following labs and imaging studies  CBC: Recent Labs  Lab 06/13/20 1839 06/14/20 0139 06/15/20 0552 06/16/20 0652  WBC 9.1 9.1 8.2 7.1  NEUTROABS 6.8  --   --   --   HGB 13.7 13.4 13.1 12.1  HCT 45.6 44.9 42.7 38.9  MCV 110.1* 110.0* 109.5* 111.8*  PLT PLATELET CLUMPS NOTED ON SMEAR, COUNT APPEARS DECREASED PLATELET CLUMPS NOTED ON SMEAR, UNABLE TO ESTIMATE PLATELET CLUMPS NOTED ON SMEAR,  UNABLE TO ESTIMATE 70*   Basic Metabolic Panel: Recent Labs  Lab 06/13/20 1839 06/14/20 0139 06/15/20 0552 06/16/20 0652  NA 159* 159* 154* 146*  K 3.4* 3.6 3.1* 3.4*  CL 122* 119* 116* 110  CO2 23 26 24 24   GLUCOSE 180* 166* 187* 165*  BUN 46* 41* 27* 13  CREATININE 1.24* 1.10* 0.91 0.74  CALCIUM 9.0 8.8* 8.4* 8.4*  MG  --  2.0  --   --    GFR: Estimated Creatinine Clearance: 32.3 mL/min (by C-G formula based on SCr of 0.74 mg/dL). Liver Function Tests: No results for input(s): AST, ALT, ALKPHOS, BILITOT, PROT, ALBUMIN in the last 168 hours. No results for input(s): LIPASE, AMYLASE in the last 168 hours. Recent Labs  Lab 06/14/20 0139  AMMONIA 40*   Coagulation Profile: No results for input(s): INR, PROTIME in the last 168 hours. Cardiac Enzymes: No results for input(s): CKTOTAL, CKMB, CKMBINDEX, TROPONINI in the last 168 hours. BNP (last 3 results) No results for input(s): PROBNP in the last 8760 hours. HbA1C: No results for input(s): HGBA1C in the last 72 hours. CBG: Recent Labs  Lab 06/16/20 2331  06/16/20 2333 06/17/20 0336 06/17/20 0822 06/17/20 1120  GLUCAP 129* 133* 143* 155* 137*   Lipid Profile: No results for input(s): CHOL, HDL, LDLCALC, TRIG, CHOLHDL, LDLDIRECT in the last 72 hours. Thyroid Function Tests: No results for input(s): TSH, T4TOTAL, FREET4, T3FREE, THYROIDAB in the last 72 hours. Anemia Panel: No results for input(s): VITAMINB12, FOLATE, FERRITIN, TIBC, IRON, RETICCTPCT in the last 72 hours. Sepsis Labs: Recent Labs  Lab 06/14/20 0139  PROCALCITON <0.10    Recent Results (from the past 240 hour(s))  Blood culture (routine x 2)     Status: None (Preliminary result)   Collection Time: 06/13/20  6:35 PM   Specimen: BLOOD RIGHT HAND  Result Value Ref Range Status   Specimen Description BLOOD RIGHT HAND  Final   Special Requests   Final    BOTTLES DRAWN AEROBIC AND ANAEROBIC Blood Culture results may not be optimal due to an inadequate volume of blood received in culture bottles   Culture   Final    NO GROWTH 4 DAYS Performed at [facility], [place] [address], [place], [place] [address]    Report Status PENDING  Incomplete  SARS Coronavirus 2 by RT PCR (hospital order, performed in [facility] hospital lab) Nasopharyngeal Nasopharyngeal Swab     Status: None   Collection Time: 06/13/20  7:23 PM   Specimen: Nasopharyngeal Swab  Result Value Ref Range Status   SARS Coronavirus 2 NEGATIVE NEGATIVE Final    Comment: (NOTE) SARS-CoV-2 target nucleic acids are NOT DETECTED.  The SARS-CoV-2 RNA is generally detectable in upper and lower respiratory specimens during the acute phase of infection. The lowest concentration of SARS-CoV-2 viral copies this assay can detect is 250 copies / mL. A negative result does not preclude SARS-CoV-2 infection and should not be used as the sole basis for treatment or other patient management decisions.  A negative result may occur with improper specimen collection / handling, submission of specimen other than  nasopharyngeal swab, presence of viral mutation(s) within the areas targeted by this assay, and inadequate number of viral copies (<250 copies / mL). A negative result must be combined with clinical observations, patient history, and epidemiological information.  Fact Sheet for Patients:   StrictlyIdeas.no  Fact Sheet for Healthcare Providers: BankingDealers.co.za  This test is not yet approved or  cleared  by the [place] and has been authorized for detection and/or diagnosis of SARS-CoV-2 by FDA under an Emergency Use Authorization (EUA).  This EUA will remain in effect (meaning this test can be used) for the duration of the COVID-19 declaration under Section 564(b)(1) of the Act, 21 U.S.C. section 360bbb-3(b)(1), unless the authorization is terminated or revoked sooner.  Performed at [facility], [place] [address] [place], [place] [address]   Blood culture (routine x 2)     Status: None (Preliminary result)   Collection Time: 06/14/20  1:39 AM   Specimen: BLOOD  Result Value Ref Range Status   Specimen Description BLOOD RIGHT ANTECUBITAL  Final   Special Requests   Final    BOTTLES DRAWN AEROBIC AND ANAEROBIC Blood Culture adequate volume   Culture   Final    NO GROWTH 3 DAYS Performed at [facility], [place] [address] [place],  [address]    Report Status PENDING  Incomplete     Radiology Studies: No results found.  Scheduled Meds: . chlorhexidine  15 mL Mouth Rinse BID  . insulin aspart  0-9 Units Subcutaneous TID WC  . lidocaine  2 patch Transdermal Daily  . mouth rinse  15 mL Mouth Rinse q12n4p  . metoprolol tartrate  2.5 mg Intravenous Q6H  . pantoprazole (PROTONIX) IV  40 mg Intravenous Daily  . sodium chloride flush  3 mL Intravenous Q12H   Continuous Infusions: . sodium chloride    . dextrose 75 mL/hr at 06/16/20 1350     LOS: 3 days   Marylu Lund, MD [facility] Pager On  Amion  If 7PM-7AM, please contact night-coverage 06/17/2020, 2:46 PM

## 2020-06-18 DIAGNOSIS — R4182 Altered mental status, unspecified: Secondary | ICD-10-CM

## 2020-06-18 LAB — CULTURE, BLOOD (ROUTINE X 2): Culture: NO GROWTH

## 2020-06-18 LAB — GLUCOSE, CAPILLARY
Glucose-Capillary: 158 mg/dL — ABNORMAL HIGH (ref 70–99)
Glucose-Capillary: 171 mg/dL — ABNORMAL HIGH (ref 70–99)
Glucose-Capillary: 234 mg/dL — ABNORMAL HIGH (ref 70–99)
Glucose-Capillary: 75 mg/dL (ref 70–99)
Glucose-Capillary: 83 mg/dL (ref 70–99)

## 2020-06-18 MED ORDER — MORPHINE SULFATE (CONCENTRATE) 10 MG/0.5ML PO SOLN: 10.0000 mg | ORAL | Status: DC | PRN

## 2020-06-18 NOTE — TOC Progression Note (Signed)
Transition of Care [facility]) - Progression Note    Patient Details  Name: [name] MRN: [id number] Date of Birth: 21-Apr-1928  [facility] of [facility]) CM/SW Shelly, LCSW Phone Number: 06/18/2020, 10:00 AM  Clinical Narrative: CSW reached out to [facility] liaison Venia Carbon regarding residential hospice placement at [facility]. CSW notes at this time they are unsure of bed availability but will follow-up with CSW when determined.    Expected Discharge Plan: [place] Barriers to Discharge:  (Hospice Bed availability)  Expected Discharge Plan and Services Expected Discharge Plan: [place] In-house Referral: Clinical Social Work, Hospice / Palliative Care                                             Social Determinants of Health (SDOH) Interventions    Readmission Risk Interventions No flowsheet data found.

## 2020-06-18 NOTE — Progress Notes (Signed)
Manufacturing engineer [facility]) [facility] does not have any further bed availability today.  Updated TOC manager.   Venia Carbon RN, BSN, [facility] Liaison

## 2020-06-18 NOTE — Progress Notes (Signed)
Spoke with Barbie RN re midline.  Recommend to remove line and notify MD of changes in UE, and possible consideration for venous study.

## 2020-06-18 NOTE — Progress Notes (Signed)
PROGRESS NOTE    [name]  IRC:[id number] DOB: December 23, 1928 DOA: 06/13/2020 PCP: Patrick Jupiter    Brief Narrative:  86 year old female with advanced dementia from SNF, she has a history of CAD, chronic systolic and diastolic CHF, EF of 51%, chronic kidney disease stage III, complete heart block status post pacer was sent to the ED from her nursing home with worsening mental status, decreased p.o. intake.  Reportedly at baseline is alert and oriented to self only, unable to ambulate, dependent on others for all ADLs, apparently stopped eating or drinking in the last few days, [name] is her guardian/representative [name]. [facility] case representative was contacted by her providers at [facility] on 6/10 with suggestions for hospice and comfort focused care, this was escalated up to her managers, still awaiting further discussions, in the meantime patient became more drowsy and lethargic and was sent to the emergency room.  She was also recently started on antibiotics for UTI and had a PICC line placed.  Work-up in the ED, noted normal vital signs, EKG with left bundle branch block, CT head showed advanced atrophy, no acute findings, chest x-ray noted cardiomegaly and possible bilateral opacities, his serum sodium was 159 and creatinine was 1.2. -She was started on IV fluids and antibiotics  Assessment & Plan:   Principal Problem:   Acute encephalopathy Active Problems:   CKD (chronic kidney disease), stage III (HCC)   PAF (paroxysmal atrial fibrillation) (HCC)   Coronary artery disease   Type II diabetes mellitus with renal manifestations (HCC)   Complete heart block (HCC)   Chronic combined systolic and diastolic CHF (congestive heart failure) (HCC)   Hypernatremia   Hypokalemia   Multifocal pneumonia   Encephalopathy   Pressure injury of skin  Acute metabolic encephalopathy Hypernatremia -Thought to be secondary to dehydration, hypernatremia in the  background of advanced dementia -Also concern for possible aspiration PNA, pt had been continued on IV ceftriaxone and azithromycin, to complete course today -Patient had been continued on D5 water -Mentation remains largely unchanged despite improvement in sodium level -Overall prognosis very poor  Possible pneumonia -Suspect aspiration, dysphagia in the setting of advanced dementia -SLP consulted, unsafe to swallow with NPO recommended -completed course of IV abx  Advanced dementia -Bedbound, total care, oriented to self only at baseline -Now with ongoing failure to thrive -No significant improvement and patient remains unable to tolerate PO safely. Long term prognosis is very poor. Had updated DSS POA [name] at [phone] as well as supervisor [name] at [phone]. All are in agreement that outlook is grim at this point and that a transition to hospice and comfort focused care would be indicated. -See nursing notes. Patient still not wanting to eat and is refusing food -Prognosis is anticipated to be less than 2 weeks given lack of sustainable PO intake -Have placed TOC consult for residential hospice, pending  Chronic combined systolic and diastolic CHF -Last echo with EF of 30 to 35% -Had remained on Entresto and beta-blocker, although pt currently not able to tolerate PO safely thus far  Paroxysmal atrial fibrillation -Not on anticoagulation at baseline, continued on beta-blocker  Stage IIIb chronic kidney disease -Was stable on last check   Type 2 diabetes mellitus -Not on meds,  -Continued on SSI coverage as needed  DVT prophylaxis: SCD's Code Status: DNR Family Communication: Pt in room, family not at bedside. Have updated DSS caregivers as per above  Status is: Inpatient  Remains inpatient appropriate  because:Unsafe d/c plan   Dispo: The patient is from: SNF              Anticipated d/c is to: Residential Hospice               Anticipated d/c date is: when bed becomes available              Patient currently is medically stable to d/c., Just pending on residential hospice bed availability  Consultants:   Palliative Care  Procedures:     Antimicrobials: Anti-infectives (From admission, onward)   Start     Dose/Rate Route Frequency Ordered Stop   06/14/20 2000  azithromycin (ZITHROMAX) 500 mg in sodium chloride 0.9 % 250 mL IVPB        500 mg 250 mL/hr over 60 Minutes Intravenous Every 24 hours 06/14/20 1454 06/16/20 2245   06/14/20 1815  cefTRIAXone (ROCEPHIN) 2 g in sodium chloride 0.9 % 100 mL IVPB  Status:  Discontinued        2 g 200 mL/hr over 30 Minutes Intravenous Every 24 hours 06/13/20 2336 06/17/20 0840   06/14/20 1815  azithromycin (ZITHROMAX) 500 mg in sodium chloride 0.9 % 250 mL IVPB  Status:  Discontinued        500 mg 250 mL/hr over 60 Minutes Intravenous Every 24 hours 06/13/20 2336 06/14/20 0741   06/14/20 1800  azithromycin (ZITHROMAX) tablet 500 mg  Status:  Discontinued        500 mg Oral Every 24 hours 06/14/20 0741 06/14/20 1454   06/13/20 1815  cefTRIAXone (ROCEPHIN) 1 g in sodium chloride 0.9 % 100 mL IVPB        1 g 200 mL/hr over 30 Minutes Intravenous  Once 06/13/20 1814 06/13/20 2018   06/13/20 1815  azithromycin (ZITHROMAX) 500 mg in sodium chloride 0.9 % 250 mL IVPB        500 mg 250 mL/hr over 60 Minutes Intravenous  Once 06/13/20 1814 06/13/20 2125      Subjective: Cannot obtain given dementia. Unable to converse appropriately  Objective: Vitals:   06/18/20 0408 06/18/20 0500 06/18/20 0717 06/18/20 1154  BP: 137/70  140/85 125/66  Pulse: 79  71 70  Resp: 18  20 20   Temp: 97.7 F (36.5 C)  (!) 97.5 F (36.4 C) 98.7 F (37.1 C)  TempSrc: Oral  Axillary Axillary  SpO2: 94%  96% 98%  Weight:  48.5 kg    Height:        Intake/Output Summary (Last 24 hours) at 06/18/2020 1345 Last data filed at 06/18/2020 1237 Gross per 24 hour  Intake 3 ml  Output 575 ml    Net -572 ml   Filed Weights   06/14/20 0500 06/15/20 0500 06/18/20 0500  Weight: 43.6 kg 43.8 kg 48.5 kg    Examination: General exam: Somewhat conversant, in no acute distress Respiratory system: normal chest rise, increased resp effort, coarse breath sounds, no audible wheezing Cardiovascular system: regular rhythm, s1-s2 Gastrointestinal system: Nondistended, nontender, pos BS Central nervous system: No seizures, no tremors Extremities: No cyanosis, no joint deformities Skin: No rashes, no pallor Psychiatry: Affect normal // no auditory hallucinations   Data Reviewed: I have personally reviewed following labs and imaging studies  CBC: Recent Labs  Lab 06/13/20 1839 06/14/20 0139 06/15/20 0552 06/16/20 0652  WBC 9.1 9.1 8.2 7.1  NEUTROABS 6.8  --   --   --   HGB 13.7 13.4 13.1 12.1  HCT 45.6 44.9 42.7 38.9  MCV 110.1* 110.0* 109.5* 111.8*  PLT PLATELET CLUMPS NOTED ON SMEAR, COUNT APPEARS DECREASED PLATELET CLUMPS NOTED ON SMEAR, UNABLE TO ESTIMATE PLATELET CLUMPS NOTED ON SMEAR, UNABLE TO ESTIMATE 70*   Basic Metabolic Panel: Recent Labs  Lab 06/13/20 1839 06/14/20 0139 06/15/20 0552 06/16/20 0652  NA 159* 159* 154* 146*  K 3.4* 3.6 3.1* 3.4*  CL 122* 119* 116* 110  CO2 23 26 24 24   GLUCOSE 180* 166* 187* 165*  BUN 46* 41* 27* 13  CREATININE 1.24* 1.10* 0.91 0.74  CALCIUM 9.0 8.8* 8.4* 8.4*  MG  --  2.0  --   --    GFR: Estimated Creatinine Clearance: 33.6 mL/min (by C-G formula based on SCr of 0.74 mg/dL). Liver Function Tests: No results for input(s): AST, ALT, ALKPHOS, BILITOT, PROT, ALBUMIN in the last 168 hours. No results for input(s): LIPASE, AMYLASE in the last 168 hours. Recent Labs  Lab 06/14/20 0139  AMMONIA 40*   Coagulation Profile: No results for input(s): INR, PROTIME in the last 168 hours. Cardiac Enzymes: No results for input(s): CKTOTAL, CKMB, CKMBINDEX, TROPONINI in the last 168 hours. BNP (last 3 results) No results for  input(s): PROBNP in the last 8760 hours. HbA1C: No results for input(s): HGBA1C in the last 72 hours. CBG: Recent Labs  Lab 06/17/20 1120 06/17/20 1614 06/17/20 2113 06/18/20 0720 06/18/20 1206  GLUCAP 137* 86 112* 171* 234*   Lipid Profile: No results for input(s): CHOL, HDL, LDLCALC, TRIG, CHOLHDL, LDLDIRECT in the last 72 hours. Thyroid Function Tests: No results for input(s): TSH, T4TOTAL, FREET4, T3FREE, THYROIDAB in the last 72 hours. Anemia Panel: No results for input(s): VITAMINB12, FOLATE, FERRITIN, TIBC, IRON, RETICCTPCT in the last 72 hours. Sepsis Labs: Recent Labs  Lab 06/14/20 0139  PROCALCITON <0.10    Recent Results (from the past 240 hour(s))  Blood culture (routine x 2)     Status: None   Collection Time: 06/13/20  6:35 PM   Specimen: BLOOD RIGHT HAND  Result Value Ref Range Status   Specimen Description BLOOD RIGHT HAND  Final   Special Requests   Final    BOTTLES DRAWN AEROBIC AND ANAEROBIC Blood Culture results may not be optimal due to an inadequate volume of blood received in culture bottles   Culture   Final    NO GROWTH 5 DAYS Performed at [facility], [place] [address] [place], [place] [address]    Report Status 06/18/2020 FINAL  Final  SARS Coronavirus 2 by RT PCR (hospital order, performed in [facility] hospital lab) Nasopharyngeal Nasopharyngeal Swab     Status: None   Collection Time: 06/13/20  7:23 PM   Specimen: Nasopharyngeal Swab  Result Value Ref Range Status   SARS Coronavirus 2 NEGATIVE NEGATIVE Final    Comment: (NOTE) SARS-CoV-2 target nucleic acids are NOT DETECTED.  The SARS-CoV-2 RNA is generally detectable in upper and lower respiratory specimens during the acute phase of infection. The lowest concentration of SARS-CoV-2 viral copies this assay can detect is 250 copies / mL. A negative result does not preclude SARS-CoV-2 infection and should not be used as the sole basis for treatment or other patient management  decisions.  A negative result may occur with improper specimen collection / handling, submission of specimen other than nasopharyngeal swab, presence of viral mutation(s) within the areas targeted by this assay, and inadequate number of viral copies (<250 copies / mL). A negative result must be combined with clinical observations, patient history, and  epidemiological information.  Fact Sheet for Patients:   StrictlyIdeas.no  Fact Sheet for Healthcare Providers: BankingDealers.co.za  This test is not yet approved or  cleared by the [place] FDA and has been authorized for detection and/or diagnosis of SARS-CoV-2 by FDA under an Emergency Use Authorization (EUA).  This EUA will remain in effect (meaning this test can be used) for the duration of the COVID-19 declaration under Section 564(b)(1) of the Act, 21 U.S.C. section 360bbb-3(b)(1), unless the authorization is terminated or revoked sooner.  Performed at [facility], [place] [address] [place], [place] [address]   Blood culture (routine x 2)     Status: None (Preliminary result)   Collection Time: 06/14/20  1:39 AM   Specimen: BLOOD  Result Value Ref Range Status   Specimen Description BLOOD RIGHT ANTECUBITAL  Final   Special Requests   Final    BOTTLES DRAWN AEROBIC AND ANAEROBIC Blood Culture adequate volume   Culture   Final    NO GROWTH 4 DAYS Performed at [facility], [place] [address] [place], [place] [address]    Report Status PENDING  Incomplete     Radiology Studies: No results found.  Scheduled Meds:  chlorhexidine  15 mL Mouth Rinse BID   insulin aspart  0-9 Units Subcutaneous TID WC   lidocaine  2 patch Transdermal Daily   mouth rinse  15 mL Mouth Rinse q12n4p   metoprolol tartrate  2.5 mg Intravenous Q6H   pantoprazole (PROTONIX) IV  40 mg Intravenous Daily   sodium chloride flush  3 mL Intravenous Q12H   Continuous Infusions:   sodium chloride     dextrose 75 mL/hr at 06/18/20 0630     LOS: 4 days   Marylu Lund, MD [facility] Pager On Amion  If 7PM-7AM, please contact night-coverage 06/18/2020, 1:45 PM

## 2020-06-18 NOTE — Progress Notes (Signed)
Pt refuses food or fluids. Small 1/4 tip of teaspoon of applesauce place in mouth and pt. Proceed to tongue it out/or tries to spit it out. LOC/alert state is worrisome for aspiration risk as well. Calls out [employer] as only intelligible  spontaneous verbal response. Pt. With light gray liquid stool, clear to cloudy, not solid in consistency, density.. Change, new Purewick with skin care. Repositioned. Resists turning and moans. Falls asleep when not stimulated or left alone. HOB up 30 degrees. Simmie Davies RN

## 2020-06-19 ENCOUNTER — Inpatient Hospital Stay (HOSPITAL_COMMUNITY): Payer: Medicare Other

## 2020-06-19 DIAGNOSIS — I82409 Acute embolism and thrombosis of unspecified deep veins of unspecified lower extremity: Secondary | ICD-10-CM

## 2020-06-19 LAB — CULTURE, BLOOD (ROUTINE X 2)
Culture: NO GROWTH
Special Requests: ADEQUATE

## 2020-06-19 LAB — GLUCOSE, CAPILLARY
Glucose-Capillary: 112 mg/dL — ABNORMAL HIGH (ref 70–99)
Glucose-Capillary: 137 mg/dL — ABNORMAL HIGH (ref 70–99)
Glucose-Capillary: 152 mg/dL — ABNORMAL HIGH (ref 70–99)
Glucose-Capillary: 196 mg/dL — ABNORMAL HIGH (ref 70–99)

## 2020-06-19 NOTE — Progress Notes (Signed)
PROGRESS NOTE    [name]  PNT:[id number] DOB: 1928/03/11 DOA: 06/13/2020 PCP: Patrick Jupiter    Brief Narrative:  86 year old female with advanced dementia from SNF, she has a history of CAD, chronic systolic and diastolic CHF, EF of 08%, chronic kidney disease stage III, complete heart block status post pacer was sent to the ED from her nursing home with worsening mental status, decreased p.o. intake.  Reportedly at baseline is alert and oriented to self only, unable to ambulate, dependent on others for all ADLs, apparently stopped eating or drinking in the last few days, [place] is her guardian/representative [name]. [facility] case representative was contacted by her providers at [facility] on 6/10 with suggestions for hospice and comfort focused care, this was escalated up to her managers, still awaiting further discussions, in the meantime patient became more drowsy and lethargic and was sent to the emergency room.  She was also recently started on antibiotics for UTI and had a PICC line placed.  Work-up in the ED, noted normal vital signs, EKG with left bundle branch block, CT head showed advanced atrophy, no acute findings, chest x-ray noted cardiomegaly and possible bilateral opacities, his serum sodium was 159 and creatinine was 1.2. -She was started on IV fluids and antibiotics  Assessment & Plan:   Principal Problem:   Acute encephalopathy Active Problems:   CKD (chronic kidney disease), stage III (HCC)   PAF (paroxysmal atrial fibrillation) (HCC)   Coronary artery disease   Type II diabetes mellitus with renal manifestations (HCC)   Complete heart block (HCC)   Chronic combined systolic and diastolic CHF (congestive heart failure) (HCC)   Hypernatremia   Hypokalemia   Multifocal pneumonia   Encephalopathy   Pressure injury of skin  Acute metabolic encephalopathy Hypernatremia -Thought to be secondary to dehydration, hypernatremia in the  background of advanced dementia -Also concern for possible aspiration PNA, pt had been continued on IV ceftriaxone and azithromycin, to complete course today -Patient had been continued on D5 water -Mentation remains largely unchanged despite improvement in sodium level -Overall prognosis extremely poor given pt's unwillingness to tolerate PO intake  Possible pneumonia -Suspect aspiration, dysphagia in the setting of advanced dementia -SLP consulted, unsafe to swallow with NPO recommended -completed course of IV abx  Advanced dementia -Bedbound, total care, oriented to self only at baseline -Now with ongoing failure to thrive -No significant improvement and patient remains unable to tolerate PO safely. Long term prognosis is very poor. Had updated DSS POA [name] at [phone] as well as supervisor Margretta Sidle at [phone]. All are in agreement that outlook is grim at this point and that a transition to hospice and comfort focused care would be indicated. -See nursing notes. Patient still not wanting to eat and is refusing food -Prognosis is anticipated to be less than 2 weeks given lack of sustainable PO intake -Have placed TOC consult for residential hospice, pending bed availability  Chronic combined systolic and diastolic CHF -Last echo with EF of 30 to 35% -Had remained on Entresto and beta-blocker, although pt currently not able to tolerate PO safely thus far  Paroxysmal atrial fibrillation -Not on anticoagulation at baseline, continued on beta-blocker  Stage IIIb chronic kidney disease -Was stable on last check   Type 2 diabetes mellitus -Not on meds,  -Continued on SSI coverage as needed  DVT prophylaxis: SCD's Code Status: DNR Family Communication: Pt in room, family not at bedside. Have updated DSS caregivers as per  above  Status is: Inpatient  Remains inpatient appropriate because:Unsafe d/c plan   Dispo: The patient is from: SNF               Anticipated d/c is to: Residential Hospice              Anticipated d/c date is: when bed becomes available              Patient currently is medically stable to d/c., Just pending on residential hospice bed availability  Consultants:   Palliative Care  Procedures:     Antimicrobials: Anti-infectives (From admission, onward)   Start     Dose/Rate Route Frequency Ordered Stop   06/14/20 2000  azithromycin (ZITHROMAX) 500 mg in sodium chloride 0.9 % 250 mL IVPB        500 mg 250 mL/hr over 60 Minutes Intravenous Every 24 hours 06/14/20 1454 06/16/20 2245   06/14/20 1815  cefTRIAXone (ROCEPHIN) 2 g in sodium chloride 0.9 % 100 mL IVPB  Status:  Discontinued        2 g 200 mL/hr over 30 Minutes Intravenous Every 24 hours 06/13/20 2336 06/17/20 0840   06/14/20 1815  azithromycin (ZITHROMAX) 500 mg in sodium chloride 0.9 % 250 mL IVPB  Status:  Discontinued        500 mg 250 mL/hr over 60 Minutes Intravenous Every 24 hours 06/13/20 2336 06/14/20 0741   06/14/20 1800  azithromycin (ZITHROMAX) tablet 500 mg  Status:  Discontinued        500 mg Oral Every 24 hours 06/14/20 0741 06/14/20 1454   06/13/20 1815  cefTRIAXone (ROCEPHIN) 1 g in sodium chloride 0.9 % 100 mL IVPB        1 g 200 mL/hr over 30 Minutes Intravenous  Once 06/13/20 1814 06/13/20 2018   06/13/20 1815  azithromycin (ZITHROMAX) 500 mg in sodium chloride 0.9 % 250 mL IVPB        500 mg 250 mL/hr over 60 Minutes Intravenous  Once 06/13/20 1814 06/13/20 2125      Subjective: Unable to obtain given dementia  Objective: Vitals:   06/19/20 0442 06/19/20 0447 06/19/20 0754 06/19/20 1119  BP: (!) 145/83  116/77 130/68  Pulse: 92  66 (!) 59  Resp: 18  20 20   Temp: 97.8 F (36.6 C)  97.9 F (36.6 C) 97.9 F (36.6 C)  TempSrc: Axillary  Axillary Axillary  SpO2: 96%  100% 100%  Weight:  48.5 kg    Height:        Intake/Output Summary (Last 24 hours) at 06/19/2020 1554 Last data filed at 06/19/2020 1100 Gross per 24  hour  Intake 1604.33 ml  Output 1250 ml  Net 354.33 ml   Filed Weights   06/15/20 0500 06/18/20 0500 06/19/20 0447  Weight: 43.8 kg 48.5 kg 48.5 kg    Examination: General exam: Awake, laying in bed, in nad Respiratory system: Normal respiratory effort, no wheezing Cardiovascular system: regular rate, s1, s2 Gastrointestinal system: Soft, nondistended, positive BS Central nervous system: CN2-12 grossly intact, strength intact Extremities: Perfused, no clubbing Skin: Normal skin turgor, no notable skin lesions seen Psychiatry: Unable to assess given dementia  Data Reviewed: I have personally reviewed following labs and imaging studies  CBC: Recent Labs  Lab 06/13/20 1839 06/14/20 0139 06/15/20 0552 06/16/20 0652  WBC 9.1 9.1 8.2 7.1  NEUTROABS 6.8  --   --   --   HGB 13.7 13.4 13.1 12.1  HCT 45.6 44.9 42.7  38.9  MCV 110.1* 110.0* 109.5* 111.8*  PLT PLATELET CLUMPS NOTED ON SMEAR, COUNT APPEARS DECREASED PLATELET CLUMPS NOTED ON SMEAR, UNABLE TO ESTIMATE PLATELET CLUMPS NOTED ON SMEAR, UNABLE TO ESTIMATE 70*   Basic Metabolic Panel: Recent Labs  Lab 06/13/20 1839 06/14/20 0139 06/15/20 0552 06/16/20 0652  NA 159* 159* 154* 146*  K 3.4* 3.6 3.1* 3.4*  CL 122* 119* 116* 110  CO2 23 26 24 24   GLUCOSE 180* 166* 187* 165*  BUN 46* 41* 27* 13  CREATININE 1.24* 1.10* 0.91 0.74  CALCIUM 9.0 8.8* 8.4* 8.4*  MG  --  2.0  --   --    GFR: Estimated Creatinine Clearance: 33.6 mL/min (by C-G formula based on SCr of 0.74 mg/dL). Liver Function Tests: No results for input(s): AST, ALT, ALKPHOS, BILITOT, PROT, ALBUMIN in the last 168 hours. No results for input(s): LIPASE, AMYLASE in the last 168 hours. Recent Labs  Lab 06/14/20 0139  AMMONIA 40*   Coagulation Profile: No results for input(s): INR, PROTIME in the last 168 hours. Cardiac Enzymes: No results for input(s): CKTOTAL, CKMB, CKMBINDEX, TROPONINI in the last 168 hours. BNP (last 3 results) No results for  input(s): PROBNP in the last 8760 hours. HbA1C: No results for input(s): HGBA1C in the last 72 hours. CBG: Recent Labs  Lab 06/18/20 2348 06/19/20 0426 06/19/20 0752 06/19/20 1117 06/19/20 1504  GLUCAP 75 112* 137* 152* 196*   Lipid Profile: No results for input(s): CHOL, HDL, LDLCALC, TRIG, CHOLHDL, LDLDIRECT in the last 72 hours. Thyroid Function Tests: No results for input(s): TSH, T4TOTAL, FREET4, T3FREE, THYROIDAB in the last 72 hours. Anemia Panel: No results for input(s): VITAMINB12, FOLATE, FERRITIN, TIBC, IRON, RETICCTPCT in the last 72 hours. Sepsis Labs: Recent Labs  Lab 06/14/20 0139  PROCALCITON <0.10    Recent Results (from the past 240 hour(s))  Blood culture (routine x 2)     Status: None   Collection Time: 06/13/20  6:35 PM   Specimen: BLOOD RIGHT HAND  Result Value Ref Range Status   Specimen Description BLOOD RIGHT HAND  Final   Special Requests   Final    BOTTLES DRAWN AEROBIC AND ANAEROBIC Blood Culture results may not be optimal due to an inadequate volume of blood received in culture bottles   Culture   Final    NO GROWTH 5 DAYS Performed at [facility], [place] [address], [place], [place] [address]    Report Status 06/18/2020 FINAL  Final  SARS Coronavirus 2 by RT PCR (hospital order, performed in [facility] hospital lab) Nasopharyngeal Nasopharyngeal Swab     Status: None   Collection Time: 06/13/20  7:23 PM   Specimen: Nasopharyngeal Swab  Result Value Ref Range Status   SARS Coronavirus 2 NEGATIVE NEGATIVE Final    Comment: (NOTE) SARS-CoV-2 target nucleic acids are NOT DETECTED.  The SARS-CoV-2 RNA is generally detectable in upper and lower respiratory specimens during the acute phase of infection. The lowest concentration of SARS-CoV-2 viral copies this assay can detect is 250 copies / mL. A negative result does not preclude SARS-CoV-2 infection and should not be used as the sole basis for treatment or other patient management  decisions.  A negative result may occur with improper specimen collection / handling, submission of specimen other than nasopharyngeal swab, presence of viral mutation(s) within the areas targeted by this assay, and inadequate number of viral copies (<250 copies / mL). A negative result must be combined with clinical observations, patient  history, and epidemiological information.  Fact Sheet for Patients:   StrictlyIdeas.no  Fact Sheet for Healthcare Providers: BankingDealers.co.za  This test is not yet approved or  cleared by the [place] FDA and has been authorized for detection and/or diagnosis of SARS-CoV-2 by FDA under an Emergency Use Authorization (EUA).  This EUA will remain in effect (meaning this test can be used) for the duration of the COVID-19 declaration under Section 564(b)(1) of the Act, 21 U.S.C. section 360bbb-3(b)(1), unless the authorization is terminated or revoked sooner.  Performed at [facility], [place] [address] [place], [place] [address]   Blood culture (routine x 2)     Status: None   Collection Time: 06/14/20  1:39 AM   Specimen: BLOOD  Result Value Ref Range Status   Specimen Description BLOOD RIGHT ANTECUBITAL  Final   Special Requests   Final    BOTTLES DRAWN AEROBIC AND ANAEROBIC Blood Culture adequate volume   Culture   Final    NO GROWTH 5 DAYS Performed at [facility], [place] [address] [place], [place] [address]    Report Status 06/19/2020 FINAL  Final     Radiology Studies: VAS [place] UPPER EXTREMITY VENOUS DUPLEX  Result Date: 06/19/2020 UPPER VENOUS STUDY  Indications: Swelling, and Midline Risk Factors: Pacemaker. Limitations: Bandages and patient's altered mental status. Comparison Study: No prior study on file Performing Technologist: Sharion Dove RVS  Examination Guidelines: A complete evaluation includes B-mode imaging, spectral Doppler, color Doppler, and power Doppler as  needed of all accessible portions of each vessel. Bilateral testing is considered an integral part of a complete examination. Limited examinations for reoccurring indications may be performed as noted.  Right Findings: +----------+------------+---------+-----------+----------+-------+ RIGHT     CompressiblePhasicitySpontaneousPropertiesSummary +----------+------------+---------+-----------+----------+-------+ Subclavian               Yes       Yes                      +----------+------------+---------+-----------+----------+-------+  Left Findings: +----------+------------+---------+-----------+----------+---------------+ LEFT      CompressiblePhasicitySpontaneousProperties    Summary     +----------+------------+---------+-----------+----------+---------------+ IJV           Full       Yes       Yes                              +----------+------------+---------+-----------+----------+---------------+ Subclavian               Yes       Yes                              +----------+------------+---------+-----------+----------+---------------+ Axillary      Full                                                  +----------+------------+---------+-----------+----------+---------------+ Brachial                 Yes       Yes                              +----------+------------+---------+-----------+----------+---------------+ Radial        Full                                                  +----------+------------+---------+-----------+----------+---------------+  Ulnar                                               patent by color +----------+------------+---------+-----------+----------+---------------+ Cephalic      Full                                     thickened    +----------+------------+---------+-----------+----------+---------------+ Basilic       Full                                                   +----------+------------+---------+-----------+----------+---------------+  Summary:  Right: No evidence of thrombosis in the subclavian.  Left: No evidence of deep vein thrombosis in the upper extremity. No evidence of superficial vein thrombosis in the upper extremity. Not all portions of the brachial, basilic, or cephalic veins were visualized secondary to bandages. Intersitial fluid noted throughout the forearm. Effusion noted at left lateral elbow.  *See table(s) above for measurements and observations.  Diagnosing physician: Servando Snare MD Electronically signed by Servando Snare MD on 06/19/2020 at 3:46:46 PM.    Final     Scheduled Meds: . chlorhexidine  15 mL Mouth Rinse BID  . insulin aspart  0-9 Units Subcutaneous TID WC  . lidocaine  2 patch Transdermal Daily  . mouth rinse  15 mL Mouth Rinse q12n4p  . metoprolol tartrate  2.5 mg Intravenous Q6H  . pantoprazole (PROTONIX) IV  40 mg Intravenous Daily  . sodium chloride flush  3 mL Intravenous Q12H   Continuous Infusions: . sodium chloride    . dextrose 75 mL/hr at 06/19/20 0558     LOS: 5 days   Marylu Lund, MD [facility] Pager On Amion  If 7PM-7AM, please contact night-coverage 06/19/2020, 3:54 PM

## 2020-06-19 NOTE — Progress Notes (Signed)
VASCULAR LAB    Left upper extremity venous duplex completed.    Preliminary report:  See CV proc for preliminary results.  Demeisha Geraghty, RVT 06/19/2020, 12:41 PM

## 2020-06-19 NOTE — Plan of Care (Signed)

## 2020-06-19 NOTE — Progress Notes (Signed)
Manufacturing engineer [facility]) [facility] does not have any open beds today.   Will update her DSS SW and hospital team when we have a bed available.    Venia Carbon RN, BSN, [facility] Liaison

## 2020-06-20 LAB — GLUCOSE, CAPILLARY
Glucose-Capillary: 150 mg/dL — ABNORMAL HIGH (ref 70–99)
Glucose-Capillary: 155 mg/dL — ABNORMAL HIGH (ref 70–99)
Glucose-Capillary: 180 mg/dL — ABNORMAL HIGH (ref 70–99)
Glucose-Capillary: 139 mg/dL — ABNORMAL HIGH (ref 70–99)
Glucose-Capillary: 81 mg/dL (ref 70–99)

## 2020-06-20 NOTE — TOC Progression Note (Signed)
Transition of Care [facility]) - Progression Note    Patient Details  Name: [name] MRN: [id number] Date of Birth: 1928/12/06  Transition of Care [facility]) CM/SW [place], [place] Phone Number: 06/20/2020, 4:23 PM  Clinical Narrative:     CSW was informed by [facility], they accepted patient is is waiting on DSS Guardian to contact them and complete paperwork.   Thurmond Butts, MSW, [place] Clinical Social Worker   Expected Discharge Plan: [place] Barriers to Discharge:  (Hospice Bed availability)  Expected Discharge Plan and Services Expected Discharge Plan: [place] In-house Referral: Clinical Social Work, Hospice / Palliative Care                                             Social Determinants of Health (SDOH) Interventions    Readmission Risk Interventions No flowsheet data found.

## 2020-06-20 NOTE — Progress Notes (Signed)
Manufacturing engineer [facility])  [place]  There is not a bed for Ms. [name] today at [facility].  She does not appear to be residential hospice appropriate, more long term care appropriate.  We will keep her on our list for [facility] and update hospital once bed is available.  Venia Carbon RN, BSN, [facility] Liaison

## 2020-06-20 NOTE — TOC Progression Note (Signed)
Transition of Care [facility]) - Progression Note    Patient Details  Name: MIKAELAH TROSTLE MRN: [id number] Date of Birth: 02-04-1928  Transition of Care [facility]) CM/SW [place], [place] Phone Number: 06/20/2020, 11:48 AM  Clinical Narrative:     Bing Ree confirmed no bed at this time. CSW spoke with DSS Guardian [name], and provided update on hospice placement. Caren Griffins was agreeable to sending referral to [facility].   CSW made referral to [facility]. Cheri states they will review and inform CSW.  Thurmond Butts, MSW, [place] Clinical Social Worker   Expected Discharge Plan: [place] Barriers to Discharge:  (Hospice Bed availability)  Expected Discharge Plan and Services Expected Discharge Plan: [place] In-house Referral: Clinical Social Work, Hospice / Palliative Care                                             Social Determinants of Health (SDOH) Interventions    Readmission Risk Interventions No flowsheet data found.

## 2020-06-20 NOTE — Progress Notes (Signed)
PROGRESS NOTE    [name]  ZYS:[id number] DOB: 1928-04-28 DOA: 06/13/2020 PCP: Patrick Jupiter    Brief Narrative:  86 year old female with advanced dementia from SNF, she has a history of CAD, chronic systolic and diastolic CHF, EF of 93%, chronic kidney disease stage III, complete heart block status post pacer was sent to the ED from her nursing home with worsening mental status, decreased p.o. intake.  Reportedly at baseline is alert and oriented to self only, unable to ambulate, dependent on others for all ADLs, apparently stopped eating or drinking in the last few days, Vevay is her guardian/representative [name]. [facility] case representative was contacted by her providers at [facility] on 6/10 with suggestions for hospice and comfort focused care, this was escalated up to her managers, still awaiting further discussions, in the meantime patient became more drowsy and lethargic and was sent to the emergency room.  She was also recently started on antibiotics for UTI and had a PICC line placed.  Work-up in the ED, noted normal vital signs, EKG with left bundle branch block, CT head showed advanced atrophy, no acute findings, chest x-ray noted cardiomegaly and possible bilateral opacities, his serum sodium was 159 and creatinine was 1.2. -She was started on IV fluids and antibiotics  Assessment & Plan:   Principal Problem:   Acute encephalopathy Active Problems:   CKD (chronic kidney disease), stage III (HCC)   PAF (paroxysmal atrial fibrillation) (HCC)   Coronary artery disease   Type II diabetes mellitus with renal manifestations (HCC)   Complete heart block (HCC)   Chronic combined systolic and diastolic CHF (congestive heart failure) (HCC)   Hypernatremia   Hypokalemia   Multifocal pneumonia   Encephalopathy   Pressure injury of skin  Acute metabolic encephalopathy Hypernatremia -Thought to be secondary to dehydration, hypernatremia in the  background of advanced dementia -Also concern for possible aspiration PNA, pt had been continued on IV ceftriaxone and azithromycin, to complete course today -Patient had been continued on D5 water -Mentation remains largely unchanged despite improvement in sodium level -Continues to have no willingness to eat and is currently maintained on IVF   Possible pneumonia -Suspect aspiration, dysphagia in the setting of advanced dementia -SLP consulted, unsafe to swallow with NPO recommended, on regular comfort feeds -completed course of IV abx  Advanced dementia -Bedbound, total care, oriented to self only at baseline -Now with ongoing failure to thrive -No significant improvement and patient remains unable to tolerate PO safely. Long term prognosis is very poor. Had updated DSS POA [name] at [phone] as well as supervisor Margretta Sidle at [phone]. All are in agreement that outlook is grim at this point and that a transition to hospice and comfort focused care would be indicated. -See nursing notes. Patient still not wanting to eat and is refusing food -Prognosis is anticipated to be less than 2 weeks given lack of sustainable PO intake -Have placed TOC consult for residential hospice, bed availability pending  Chronic combined systolic and diastolic CHF -Last echo with EF of 30 to 35% -Had remained on Entresto and beta-blocker, although pt currently not able to tolerate PO safely thus far  Paroxysmal atrial fibrillation -Not on anticoagulation at baseline, continued on beta-blocker  Stage IIIb chronic kidney disease -Was stable on last check   Type 2 diabetes mellitus -Not on meds,  -Continued on SSI coverage as needed  DVT prophylaxis: SCD's Code Status: DNR Family Communication: Pt in room, family not at  bedside. Have updated DSS caregivers as per above  Status is: Inpatient  Remains inpatient appropriate because:Unsafe d/c plan   Dispo: The patient is  from: SNF              Anticipated d/c is to: Residential Hospice              Anticipated d/c date is: when bed becomes available              Patient currently is medically stable to d/c., Just pending on residential hospice bed availability  Consultants:   Palliative Care  Procedures:     Antimicrobials: Anti-infectives (From admission, onward)   Start     Dose/Rate Route Frequency Ordered Stop   06/14/20 2000  azithromycin (ZITHROMAX) 500 mg in sodium chloride 0.9 % 250 mL IVPB        500 mg 250 mL/hr over 60 Minutes Intravenous Every 24 hours 06/14/20 1454 06/16/20 2245   06/14/20 1815  cefTRIAXone (ROCEPHIN) 2 g in sodium chloride 0.9 % 100 mL IVPB  Status:  Discontinued        2 g 200 mL/hr over 30 Minutes Intravenous Every 24 hours 06/13/20 2336 06/17/20 0840   06/14/20 1815  azithromycin (ZITHROMAX) 500 mg in sodium chloride 0.9 % 250 mL IVPB  Status:  Discontinued        500 mg 250 mL/hr over 60 Minutes Intravenous Every 24 hours 06/13/20 2336 06/14/20 0741   06/14/20 1800  azithromycin (ZITHROMAX) tablet 500 mg  Status:  Discontinued        500 mg Oral Every 24 hours 06/14/20 0741 06/14/20 1454   06/13/20 1815  cefTRIAXone (ROCEPHIN) 1 g in sodium chloride 0.9 % 100 mL IVPB        1 g 200 mL/hr over 30 Minutes Intravenous  Once 06/13/20 1814 06/13/20 2018   06/13/20 1815  azithromycin (ZITHROMAX) 500 mg in sodium chloride 0.9 % 250 mL IVPB        500 mg 250 mL/hr over 60 Minutes Intravenous  Once 06/13/20 1814 06/13/20 2125      Subjective: Cannot assess given mentation  Objective: Vitals:   06/20/20 0011 06/20/20 0346 06/20/20 0804 06/20/20 1140  BP: 126/82 (!) 143/64 (!) 145/78 (!) 141/80  Pulse: 63 60 66 64  Resp: 16  12   Temp: 97.7 F (36.5 C) (!) 97.5 F (36.4 C) 97.8 F (36.6 C) 97.8 F (36.6 C)  TempSrc: Axillary Oral Axillary Axillary  SpO2: 100%  97% 100%  Weight:      Height:        Intake/Output Summary (Last 24 hours) at 06/20/2020  1335 Last data filed at 06/20/2020 1323 Gross per 24 hour  Intake 0 ml  Output 900 ml  Net -900 ml   Filed Weights   06/15/20 0500 06/18/20 0500 06/19/20 0447  Weight: 43.8 kg 48.5 kg 48.5 kg    Examination: General exam: Not conversant, in no acute distress Respiratory system: normal chest rise, clear, no audible wheezing Cardiovascular system: regular rhythm, s1-s2 Gastrointestinal system: Nondistended, nontender, pos BS Central nervous system: No seizures, no tremors Extremities: No cyanosis, no joint deformities Skin: No rashes, no pallor Psychiatry: Unable to assess given mentation  Data Reviewed: I have personally reviewed following labs and imaging studies  CBC: Recent Labs  Lab 06/13/20 1839 06/14/20 0139 06/15/20 0552 06/16/20 0652  WBC 9.1 9.1 8.2 7.1  NEUTROABS 6.8  --   --   --   HGB 13.7  13.4 13.1 12.1  HCT 45.6 44.9 42.7 38.9  MCV 110.1* 110.0* 109.5* 111.8*  PLT PLATELET CLUMPS NOTED ON SMEAR, COUNT APPEARS DECREASED PLATELET CLUMPS NOTED ON SMEAR, UNABLE TO ESTIMATE PLATELET CLUMPS NOTED ON SMEAR, UNABLE TO ESTIMATE 70*   Basic Metabolic Panel: Recent Labs  Lab 06/13/20 1839 06/14/20 0139 06/15/20 0552 06/16/20 0652  NA 159* 159* 154* 146*  K 3.4* 3.6 3.1* 3.4*  CL 122* 119* 116* 110  CO2 23 26 24 24   GLUCOSE 180* 166* 187* 165*  BUN 46* 41* 27* 13  CREATININE 1.24* 1.10* 0.91 0.74  CALCIUM 9.0 8.8* 8.4* 8.4*  MG  --  2.0  --   --    GFR: Estimated Creatinine Clearance: 33.6 mL/min (by C-G formula based on SCr of 0.74 mg/dL). Liver Function Tests: No results for input(s): AST, ALT, ALKPHOS, BILITOT, PROT, ALBUMIN in the last 168 hours. No results for input(s): LIPASE, AMYLASE in the last 168 hours. Recent Labs  Lab 06/14/20 0139  AMMONIA 40*   Coagulation Profile: No results for input(s): INR, PROTIME in the last 168 hours. Cardiac Enzymes: No results for input(s): CKTOTAL, CKMB, CKMBINDEX, TROPONINI in the last 168 hours. BNP (last  3 results) No results for input(s): PROBNP in the last 8760 hours. HbA1C: No results for input(s): HGBA1C in the last 72 hours. CBG: Recent Labs  Lab 06/19/20 1117 06/19/20 1504 06/19/20 2139 06/20/20 0803 06/20/20 1138  GLUCAP 152* 196* 150* 155* 180*   Lipid Profile: No results for input(s): CHOL, HDL, LDLCALC, TRIG, CHOLHDL, LDLDIRECT in the last 72 hours. Thyroid Function Tests: No results for input(s): TSH, T4TOTAL, FREET4, T3FREE, THYROIDAB in the last 72 hours. Anemia Panel: No results for input(s): VITAMINB12, FOLATE, FERRITIN, TIBC, IRON, RETICCTPCT in the last 72 hours. Sepsis Labs: Recent Labs  Lab 06/14/20 0139  PROCALCITON <0.10    Recent Results (from the past 240 hour(s))  Blood culture (routine x 2)     Status: None   Collection Time: 06/13/20  6:35 PM   Specimen: BLOOD RIGHT HAND  Result Value Ref Range Status   Specimen Description BLOOD RIGHT HAND  Final   Special Requests   Final    BOTTLES DRAWN AEROBIC AND ANAEROBIC Blood Culture results may not be optimal due to an inadequate volume of blood received in culture bottles   Culture   Final    NO GROWTH 5 DAYS Performed at [facility], [place] [address] [place], [place] [address]    Report Status 06/18/2020 FINAL  Final  SARS Coronavirus 2 by RT PCR (hospital order, performed in [facility] hospital lab) Nasopharyngeal Nasopharyngeal Swab     Status: None   Collection Time: 06/13/20  7:23 PM   Specimen: Nasopharyngeal Swab  Result Value Ref Range Status   SARS Coronavirus 2 NEGATIVE NEGATIVE Final    Comment: (NOTE) SARS-CoV-2 target nucleic acids are NOT DETECTED.  The SARS-CoV-2 RNA is generally detectable in upper and lower respiratory specimens during the acute phase of infection. The lowest concentration of SARS-CoV-2 viral copies this assay can detect is 250 copies / mL. A negative result does not preclude SARS-CoV-2 infection and should not be used as the sole basis for treatment or  other patient management decisions.  A negative result may occur with improper specimen collection / handling, submission of specimen other than nasopharyngeal swab, presence of viral mutation(s) within the areas targeted by this assay, and inadequate number of viral copies (<250 copies / mL). A negative  result must be combined with clinical observations, patient history, and epidemiological information.  Fact Sheet for Patients:   StrictlyIdeas.no  Fact Sheet for Healthcare Providers: BankingDealers.co.za  This test is not yet approved or  cleared by the [place] FDA and has been authorized for detection and/or diagnosis of SARS-CoV-2 by FDA under an Emergency Use Authorization (EUA).  This EUA will remain in effect (meaning this test can be used) for the duration of the COVID-19 declaration under Section 564(b)(1) of the Act, 21 U.S.C. section 360bbb-3(b)(1), unless the authorization is terminated or revoked sooner.  Performed at [facility], [place] [address] [place], [place] [address]   Blood culture (routine x 2)     Status: None   Collection Time: 06/14/20  1:39 AM   Specimen: BLOOD  Result Value Ref Range Status   Specimen Description BLOOD RIGHT ANTECUBITAL  Final   Special Requests   Final    BOTTLES DRAWN AEROBIC AND ANAEROBIC Blood Culture adequate volume   Culture   Final    NO GROWTH 5 DAYS Performed at [facility], [place] [address] [place], [place] [address]    Report Status 06/19/2020 FINAL  Final     Radiology Studies: VAS [place] UPPER EXTREMITY VENOUS DUPLEX  Result Date: 06/19/2020 UPPER VENOUS STUDY  Indications: Swelling, and Midline Risk Factors: Pacemaker. Limitations: Bandages and patient's altered mental status. Comparison Study: No prior study on file Performing Technologist: Sharion Dove RVS  Examination Guidelines: A complete evaluation includes B-mode imaging, spectral Doppler, color  Doppler, and power Doppler as needed of all accessible portions of each vessel. Bilateral testing is considered an integral part of a complete examination. Limited examinations for reoccurring indications may be performed as noted.  Right Findings: +----------+------------+---------+-----------+----------+-------+ RIGHT     CompressiblePhasicitySpontaneousPropertiesSummary +----------+------------+---------+-----------+----------+-------+ Subclavian               Yes       Yes                      +----------+------------+---------+-----------+----------+-------+  Left Findings: +----------+------------+---------+-----------+----------+---------------+ LEFT      CompressiblePhasicitySpontaneousProperties    Summary     +----------+------------+---------+-----------+----------+---------------+ IJV           Full       Yes       Yes                              +----------+------------+---------+-----------+----------+---------------+ Subclavian               Yes       Yes                              +----------+------------+---------+-----------+----------+---------------+ Axillary      Full                                                  +----------+------------+---------+-----------+----------+---------------+ Brachial                 Yes       Yes                              +----------+------------+---------+-----------+----------+---------------+ Radial  Full                                                  +----------+------------+---------+-----------+----------+---------------+ Ulnar                                               patent by color +----------+------------+---------+-----------+----------+---------------+ Cephalic      Full                                     thickened    +----------+------------+---------+-----------+----------+---------------+ Basilic       Full                                                   +----------+------------+---------+-----------+----------+---------------+  Summary:  Right: No evidence of thrombosis in the subclavian.  Left: No evidence of deep vein thrombosis in the upper extremity. No evidence of superficial vein thrombosis in the upper extremity. Not all portions of the brachial, basilic, or cephalic veins were visualized secondary to bandages. Intersitial fluid noted throughout the forearm. Effusion noted at left lateral elbow.  *See table(s) above for measurements and observations.  Diagnosing physician: Servando Snare MD Electronically signed by Servando Snare MD on 06/19/2020 at 3:46:46 PM.    Final     Scheduled Meds: . chlorhexidine  15 mL Mouth Rinse BID  . insulin aspart  0-9 Units Subcutaneous TID WC  . lidocaine  2 patch Transdermal Daily  . mouth rinse  15 mL Mouth Rinse q12n4p  . metoprolol tartrate  2.5 mg Intravenous Q6H  . pantoprazole (PROTONIX) IV  40 mg Intravenous Daily  . sodium chloride flush  3 mL Intravenous Q12H   Continuous Infusions: . sodium chloride    . dextrose 75 mL/hr at 06/19/20 0558     LOS: 6 days   Marylu Lund, MD [facility] Pager On Amion  If 7PM-7AM, please contact night-coverage 06/20/2020, 1:35 PM

## 2020-06-21 LAB — GLUCOSE, CAPILLARY
Glucose-Capillary: 162 mg/dL — ABNORMAL HIGH (ref 70–99)
Glucose-Capillary: 142 mg/dL — ABNORMAL HIGH (ref 70–99)

## 2020-06-21 MED ORDER — ALBUTEROL SULFATE (2.5 MG/3ML) 0.083% IN NEBU: 2.5000 mg | INHALATION_SOLUTION | RESPIRATORY_TRACT | 12 refills | Status: AC | PRN

## 2020-06-21 MED ORDER — MORPHINE SULFATE (CONCENTRATE) 10 MG/0.5ML PO SOLN: 10.0000 mg | ORAL | 0 refills | Status: AC | PRN

## 2020-06-21 NOTE — Discharge Summary (Signed)
Physician Discharge Summary  [name] FFM:[id number] DOB: 09-30-1928 DOA: 06/13/2020  PCP: Patrick Jupiter  Admit date: 06/13/2020 Discharge date: 06/21/2020  Admitted From: SNF Disposition:  Residential Hospice  Recommendations for Outpatient Follow-up:  1. Follow up with hospice services  Discharge Condition:Poor CODE STATUS:DNR Diet recommendation: Comfort   Brief/Interim Summary: 86 year old female with advanced dementia from SNF, she has a history of CAD, chronic systolic and diastolic CHF, EF of 57%, chronic kidney disease stage III, complete heart block status post pacer was sent to the ED from her nursing home with worsening mental status, decreased p.o. intake.Reportedly at baseline is alert and oriented to self only, unable to ambulate, dependent on others for all ADLs,apparently stopped eating or drinking in the last few Meta is her guardian/representative CynthiaStubbs. [facility] case representative was contacted by her providers at [facility] on 6/10 with suggestions for hospice and comfort focused care,this was escalated up to her managers, still awaiting further discussions, in the meantime patient became more drowsy and lethargic and was sent to the emergency room.She was also recently started on antibiotics for UTI and had a PICC line placed.Work-up in the ED, noted normal vital signs, EKG with left bundle branch block, CT head showed advanced atrophy, no acute findings, chest x-ray noted cardiomegaly and possible bilateral opacities, his serum sodium was 159 and creatinine was 1.2. -She was started on IV fluids and antibiotics  Discharge Diagnoses:  Principal Problem:   Acute encephalopathy Active Problems:   CKD (chronic kidney disease), stage III (HCC)   PAF (paroxysmal atrial fibrillation) (HCC)   Coronary artery disease   Type II diabetes mellitus with renal manifestations (HCC)   Complete heart block (HCC)   Chronic combined  systolic and diastolic CHF (congestive heart failure) (HCC)   Hypernatremia   Hypokalemia   Multifocal pneumonia   Encephalopathy   Pressure injury of skin  Acute metabolic encephalopathy Hypernatremia -Thought to be secondary to dehydration, hypernatremia in the background of advanced dementia -Also concern for possible aspiration PNA, pt had been continued on IV ceftriaxone and azithromycin, to complete course today -Patient had been continued on D5 water -Mentation remains largely unchanged despite improvement in sodium level -Continues to have no willingness to eat and had required maintenance IVF   Possible pneumonia -Suspect aspiration, dysphagia in the setting of advanced dementia -SLP consulted, unsafe to swallow with NPO recommended, on regular comfort feeds -completed course of IV abx  Advanced dementia -Bedbound, total care, oriented to self only at baseline -Now with ongoing failure to thrive -No significant improvement and patient remains unable to tolerate PO safely. Long term prognosis is very poor. Had updated DSS POA [name] at [phone] as well as supervisor Margretta Sidle at [phone]. All are in agreement that outlook is grim at this point and that a transition to hospice and comfort focused care would be indicated. -See nursing notes. Patient still not wanting to eat and is refusing food -Prognosis is anticipated to be less than 2 weeks given lack of sustainable PO intake -Have placed TOC consult for residential hospice  Chronic combined systolic and diastolic CHF -Last echo with EF of 30 to 35% -Had remained on Entresto and beta-blocker, although pt currently not able to tolerate PO safely thus far  Paroxysmal atrial fibrillation -Not on anticoagulation at baseline, continued on beta-blocker  Stage IIIb chronic kidney disease -Was stable on last check  -No further blood draws for comfort  Type 2 diabetes mellitus -Not on meds,   -  Continued on SSI coverage as needed while in hospital  Discharge Instructions   Allergies as of 06/21/2020      Reactions   Fosamax [alendronate] Anaphylaxis   Horse-derived Products Hives   Clonidine Derivatives Other (See Comments)   Unknown reaction   Darvocet [propoxyphene N-acetaminophen] Nausea And Vomiting   Lipitor [atorvastatin] Swelling   Sulfa Drugs Cross Reactors Hives, Itching   Tramadol Nausea And Vomiting   Vicodin [hydrocodone-acetaminophen] Other (See Comments)   lightheaded   Penicillins Rash   Has patient had a PCN reaction causing immediate rash, facial/tongue/throat swelling, SOB or lightheadedness with hypotension: Yes Has patient had a PCN reaction causing severe rash involving mucus membranes or skin necrosis: No Has patient had a PCN reaction that required hospitalization Yes Has patient had a PCN reaction occurring within the last 10 years: No If all of the above answers are "NO", then may proceed with Cephalosporin   Septra [sulfamethoxazole-trimethoprim] Rash      Medication List    STOP taking these medications   carvedilol 12.5 MG tablet Commonly known as: COREG   divalproex 125 MG capsule Commonly known as: DEPAKOTE SPRINKLE   famotidine 20 MG tablet Commonly known as: PEPCID   hydrALAZINE 10 MG tablet Commonly known as: APRESOLINE   insulin glargine 100 UNIT/ML injection Commonly known as: LANTUS   iron polysaccharides 150 MG capsule Commonly known as: NIFEREX   metoprolol succinate 25 MG 24 hr tablet Commonly known as: TOPROL-XL   ondansetron 4 MG tablet Commonly known as: ZOFRAN   oxybutynin 5 MG 24 hr tablet Commonly known as: DITROPAN-XL     TAKE these medications   acetaminophen 500 MG tablet Commonly known as: TYLENOL Take 1,000 mg by mouth 3 (three) times daily.   albuterol (2.5 MG/3ML) 0.083% nebulizer solution Commonly known as: PROVENTIL Take 3 mLs (2.5 mg total) by nebulization every 4 (four) hours as needed  for wheezing or shortness of breath.   Aspercreme Lidocaine 4 % Ptch Generic drug: Lidocaine Apply 1 patch topically in the morning and at bedtime. To both shoulders   aspirin 81 MG chewable tablet Chew 81 mg by mouth daily.   Biofreeze 4 % Gel Generic drug: Menthol (Topical Analgesic) Apply topically in the morning and at bedtime. To bilateral shoulder, right wrist and left knee   fish oil-omega-3 fatty acids 1000 MG capsule Take 1 g by mouth 2 (two) times daily.   morphine CONCENTRATE 10 MG/0.5ML Soln concentrated solution Take 0.5 mLs (10 mg total) by mouth every 4 (four) hours as needed for severe pain or shortness of breath.   nitroGLYCERIN 0.4 MG SL tablet Commonly known as: NITROSTAT Place 0.4 mg under the tongue every 5 (five) minutes as needed for chest pain (max 3 doses/ recheck vital signs 30 minutes after admnistering).   polyethylene glycol 17 g packet Commonly known as: MIRALAX / GLYCOLAX Take 17 g by mouth daily.   sacubitril-valsartan 49-51 MG Commonly known as: Entresto Take 1 tablet by mouth 2 (two) times daily.   Senna-S 8.6-50 MG tablet Generic drug: senna-docusate Take 2 tablets by mouth at bedtime.   sertraline 100 MG tablet Commonly known as: ZOLOFT Take 100 mg by mouth daily. Take with sertraline 50 mg to equal 150 mg   sertraline 50 MG tablet Commonly known as: ZOLOFT Take 50 mg by mouth daily. Take with sertraline 100 mg to equal 150 mg   traZODone 100 MG tablet Commonly known as: DESYREL Take 100 mg by mouth at bedtime.  Vitamin D 50 MCG (2000 UT) tablet Take 2,000 Units by mouth daily.       Follow-up Information    Follow up with hospice. Schedule an appointment as soon as possible for a visit.              Allergies  Allergen Reactions  . Fosamax [Alendronate] Anaphylaxis  . Horse-Derived Products Hives  . Clonidine Derivatives Other (See Comments)    Unknown reaction  . Darvocet [Propoxyphene N-Acetaminophen] Nausea And  Vomiting  . Lipitor [Atorvastatin] Swelling  . Sulfa Drugs Cross Reactors Hives and Itching  . Tramadol Nausea And Vomiting  . Vicodin [Hydrocodone-Acetaminophen] Other (See Comments)    lightheaded  . Penicillins Rash    Has patient had a PCN reaction causing immediate rash, facial/tongue/throat swelling, SOB or lightheadedness with hypotension: Yes Has patient had a PCN reaction causing severe rash involving mucus membranes or skin necrosis: No Has patient had a PCN reaction that required hospitalization Yes Has patient had a PCN reaction occurring within the last 10 years: No If all of the above answers are "NO", then may proceed with Cephalosporin  . Septra [Sulfamethoxazole-Trimethoprim] Rash    Consultations:  Palliative Care  Procedures/Studies: CT Head Wo Contrast  Result Date: 06/13/2020 CLINICAL DATA:  Focal neuro deficit. Suspect stroke. Altered mental status, failure to thrive. EXAM: CT HEAD WITHOUT CONTRAST TECHNIQUE: Contiguous axial images were obtained from the base of the skull through the vertex without intravenous contrast. COMPARISON:  CT head 11/15/2018 FINDINGS: Brain: Progressive atrophy which is now moderate to severe. Chronic microvascular ischemic changes in the white matter. Negative for acute infarct, hemorrhage, mass Vascular: Extensive atherosclerotic calcification in the carotid and vertebral arteries bilaterally. Negative for hyperdense vessel. Skull: No focal skeletal lesion. Sinuses/Orbits: Paranasal sinuses clear. No orbital lesion. Bilateral cataract extraction. Other: None IMPRESSION: No acute abnormality Moderate to severe atrophy which has progressed since 2019. Chronic microvascular ischemic change in the white matter. Electronically Signed   By: Franchot Gallo M.D.   On: 06/13/2020 19:25   DG Chest Portable 1 View  Result Date: 06/13/2020 CLINICAL DATA:  Evaluate PICC line placement EXAM: PORTABLE CHEST 1 VIEW COMPARISON:  10/11/2018 FINDINGS:  Cardiac enlargement. Previous median sternotomy and CABG procedure. Left chest wall AICD is noted with leads in the right atrial appendage and right ventricle. The left upper extremity PICC line tip is identified along the lateral margin of the radiograph within the left upper extremity. Small right pleural effusion. Bilateral peripheral predominant opacities are identified within the right upper lobe, right midlung and right base as well as the left lung base. IMPRESSION: 1. The left upper extremity PICC line tip is identified along the lateral margin of the radiograph within the left upper extremity. Recommend repositioning. 2. Cardiac enlargement. 3. Small right pleural effusion. 4. Bilateral pulmonary opacities concerning for multifocal infection. Electronically Signed   By: Kerby Moors M.D.   On: 06/13/2020 17:26   VAS [place] UPPER EXTREMITY VENOUS DUPLEX  Result Date: 06/19/2020 UPPER VENOUS STUDY  Indications: Swelling, and Midline Risk Factors: Pacemaker. Limitations: Bandages and patient's altered mental status. Comparison Study: No prior study on file Performing Technologist: Sharion Dove RVS  Examination Guidelines: A complete evaluation includes B-mode imaging, spectral Doppler, color Doppler, and power Doppler as needed of all accessible portions of each vessel. Bilateral testing is considered an integral part of a complete examination. Limited examinations for reoccurring indications may be performed as noted.  Right Findings: +----------+------------+---------+-----------+----------+-------+ RIGHT  CompressiblePhasicitySpontaneousPropertiesSummary +----------+------------+---------+-----------+----------+-------+ Subclavian               Yes       Yes                      +----------+------------+---------+-----------+----------+-------+  Left Findings: +----------+------------+---------+-----------+----------+---------------+ LEFT       CompressiblePhasicitySpontaneousProperties    Summary     +----------+------------+---------+-----------+----------+---------------+ IJV           Full       Yes       Yes                              +----------+------------+---------+-----------+----------+---------------+ Subclavian               Yes       Yes                              +----------+------------+---------+-----------+----------+---------------+ Axillary      Full                                                  +----------+------------+---------+-----------+----------+---------------+ Brachial                 Yes       Yes                              +----------+------------+---------+-----------+----------+---------------+ Radial        Full                                                  +----------+------------+---------+-----------+----------+---------------+ Ulnar                                               patent by color +----------+------------+---------+-----------+----------+---------------+ Cephalic      Full                                     thickened    +----------+------------+---------+-----------+----------+---------------+ Basilic       Full                                                  +----------+------------+---------+-----------+----------+---------------+  Summary:  Right: No evidence of thrombosis in the subclavian.  Left: No evidence of deep vein thrombosis in the upper extremity. No evidence of superficial vein thrombosis in the upper extremity. Not all portions of the brachial, basilic, or cephalic veins were visualized secondary to bandages. Intersitial fluid noted throughout the forearm. Effusion noted at left lateral elbow.  *See table(s) above for measurements and observations.  Diagnosing physician: Servando Snare MD Electronically signed by Servando Snare MD on 06/19/2020 at 3:46:46 PM.    Final      Subjective:  Unable to assess given  mentation  Discharge Exam: Vitals:   06/21/20 0418 06/21/20 0807  BP: (!) 148/75 (!) 154/82  Pulse: 67 62  Resp:    Temp: (!) 97.5 F (36.4 C) 97.7 F (36.5 C)  SpO2:  94%   Vitals:   06/20/20 2339 06/21/20 0418 06/21/20 0500 06/21/20 0807  BP: 130/72 (!) 148/75  (!) 154/82  Pulse: 61 67  62  Resp: 16     Temp: 97.8 F (36.6 C) (!) 97.5 F (36.4 C)  97.7 F (36.5 C)  TempSrc: Axillary Oral  Axillary  SpO2: 98%   94%  Weight:   51.7 kg   Height:        General: Pt is alert, awake, not in acute distress Cardiovascular: RRR, S1/S2 +, no rubs, no gallops Respiratory: CTA bilaterally, no wheezing, no rhonchi Abdominal: Soft, NT, ND, bowel sounds + Extremities: no edema, no cyanosis   The results of significant diagnostics from this hospitalization (including imaging, microbiology, ancillary and laboratory) are listed below for reference.     Microbiology: Recent Results (from the past 240 hour(s))  Blood culture (routine x 2)     Status: None   Collection Time: 06/13/20  6:35 PM   Specimen: BLOOD RIGHT HAND  Result Value Ref Range Status   Specimen Description BLOOD RIGHT HAND  Final   Special Requests   Final    BOTTLES DRAWN AEROBIC AND ANAEROBIC Blood Culture results may not be optimal due to an inadequate volume of blood received in culture bottles   Culture   Final    NO GROWTH 5 DAYS Performed at [facility], [place] [address] [place], [place] [address]    Report Status 06/18/2020 FINAL  Final  SARS Coronavirus 2 by RT PCR (hospital order, performed in [facility] hospital lab) Nasopharyngeal Nasopharyngeal Swab     Status: None   Collection Time: 06/13/20  7:23 PM   Specimen: Nasopharyngeal Swab  Result Value Ref Range Status   SARS Coronavirus 2 NEGATIVE NEGATIVE Final    Comment: (NOTE) SARS-CoV-2 target nucleic acids are NOT DETECTED.  The SARS-CoV-2 RNA is generally detectable in upper and lower respiratory specimens during the acute phase of  infection. The lowest concentration of SARS-CoV-2 viral copies this assay can detect is 250 copies / mL. A negative result does not preclude SARS-CoV-2 infection and should not be used as the sole basis for treatment or other patient management decisions.  A negative result may occur with improper specimen collection / handling, submission of specimen other than nasopharyngeal swab, presence of viral mutation(s) within the areas targeted by this assay, and inadequate number of viral copies (<250 copies / mL). A negative result must be combined with clinical observations, patient history, and epidemiological information.  Fact Sheet for Patients:   StrictlyIdeas.no  Fact Sheet for Healthcare Providers: BankingDealers.co.za  This test is not yet approved or  cleared by the [place] FDA and has been authorized for detection and/or diagnosis of SARS-CoV-2 by FDA under an Emergency Use Authorization (EUA).  This EUA will remain in effect (meaning this test can be used) for the duration of the COVID-19 declaration under Section 564(b)(1) of the Act, 21 U.S.C. section 360bbb-3(b)(1), unless the authorization is terminated or revoked sooner.  Performed at [facility], [place] [address] [place], [place] [address]   Blood culture (routine x 2)     Status: None   Collection Time: 06/14/20  1:39 AM  Specimen: BLOOD  Result Value Ref Range Status   Specimen Description BLOOD RIGHT ANTECUBITAL  Final   Special Requests   Final    BOTTLES DRAWN AEROBIC AND ANAEROBIC Blood Culture adequate volume   Culture   Final    NO GROWTH 5 DAYS Performed at [facility], [address] [place], [place] [address]    Report Status 06/19/2020 FINAL  Final     Labs: BNP (last 3 results) No results for input(s): BNP in the last 8760 hours. Basic Metabolic Panel: Recent Labs  Lab 06/15/20 0552 06/16/20 0652  NA 154* 146*  K 3.1* 3.4*   CL 116* 110  CO2 24 24  GLUCOSE 187* 165*  BUN 27* 13  CREATININE 0.91 0.74  CALCIUM 8.4* 8.4*   Liver Function Tests: No results for input(s): AST, ALT, ALKPHOS, BILITOT, PROT, ALBUMIN in the last 168 hours. No results for input(s): LIPASE, AMYLASE in the last 168 hours. No results for input(s): AMMONIA in the last 168 hours. CBC: Recent Labs  Lab 06/15/20 0552 06/16/20 0652  WBC 8.2 7.1  HGB 13.1 12.1  HCT 42.7 38.9  MCV 109.5* 111.8*  PLT PLATELET CLUMPS NOTED ON SMEAR, UNABLE TO ESTIMATE 70*   Cardiac Enzymes: No results for input(s): CKTOTAL, CKMB, CKMBINDEX, TROPONINI in the last 168 hours. BNP: Invalid input(s): POCBNP CBG: Recent Labs  Lab 06/20/20 0803 06/20/20 1138 06/20/20 1639 06/20/20 1959 06/21/20 1009  GLUCAP 155* 180* 139* 81 162*   D-Dimer No results for input(s): DDIMER in the last 72 hours. Hgb A1c No results for input(s): HGBA1C in the last 72 hours. Lipid Profile No results for input(s): CHOL, HDL, LDLCALC, TRIG, CHOLHDL, LDLDIRECT in the last 72 hours. Thyroid function studies No results for input(s): TSH, T4TOTAL, T3FREE, THYROIDAB in the last 72 hours.  Invalid input(s): FREET3 Anemia work up No results for input(s): VITAMINB12, FOLATE, FERRITIN, TIBC, IRON, RETICCTPCT in the last 72 hours. Urinalysis    Component Value Date/Time   COLORURINE AMBER (A) 06/14/2020 0110   APPEARANCEUR HAZY (A) 06/14/2020 0110   LABSPEC 1.019 06/14/2020 0110   PHURINE 5.0 06/14/2020 0110   GLUCOSEU NEGATIVE 06/14/2020 0110   HGBUR MODERATE (A) 06/14/2020 0110   BILIRUBINUR NEGATIVE 06/14/2020 0110   KETONESUR NEGATIVE 06/14/2020 0110   PROTEINUR NEGATIVE 06/14/2020 0110   UROBILINOGEN 0.2 01/18/2014 0846   NITRITE NEGATIVE 06/14/2020 0110   LEUKOCYTESUR TRACE (A) 06/14/2020 0110   Sepsis Labs Invalid input(s): PROCALCITONIN,  WBC,  LACTICIDVEN Microbiology Recent Results (from the past 240 hour(s))  Blood culture (routine x 2)     Status: None    Collection Time: 06/13/20  6:35 PM   Specimen: BLOOD RIGHT HAND  Result Value Ref Range Status   Specimen Description BLOOD RIGHT HAND  Final   Special Requests   Final    BOTTLES DRAWN AEROBIC AND ANAEROBIC Blood Culture results may not be optimal due to an inadequate volume of blood received in culture bottles   Culture   Final    NO GROWTH 5 DAYS Performed at [facility], [place] [address], [place],  [address]    Report Status 06/18/2020 FINAL  Final  SARS Coronavirus 2 by RT PCR (hospital order, performed in [facility] hospital lab) Nasopharyngeal Nasopharyngeal Swab     Status: None   Collection Time: 06/13/20  7:23 PM   Specimen: Nasopharyngeal Swab  Result Value Ref Range Status   SARS Coronavirus 2 NEGATIVE NEGATIVE Final  Comment: (NOTE) SARS-CoV-2 target nucleic acids are NOT DETECTED.  The SARS-CoV-2 RNA is generally detectable in upper and lower respiratory specimens during the acute phase of infection. The lowest concentration of SARS-CoV-2 viral copies this assay can detect is 250 copies / mL. A negative result does not preclude SARS-CoV-2 infection and should not be used as the sole basis for treatment or other patient management decisions.  A negative result may occur with improper specimen collection / handling, submission of specimen other than nasopharyngeal swab, presence of viral mutation(s) within the areas targeted by this assay, and inadequate number of viral copies (<250 copies / mL). A negative result must be combined with clinical observations, patient history, and epidemiological information.  Fact Sheet for Patients:   StrictlyIdeas.no  Fact Sheet for Healthcare Providers: BankingDealers.co.za  This test is not yet approved or  cleared by the [place] FDA and has been authorized for detection and/or diagnosis of SARS-CoV-2 by FDA under an Emergency Use Authorization (EUA).  This EUA  will remain in effect (meaning this test can be used) for the duration of the COVID-19 declaration under Section 564(b)(1) of the Act, 21 U.S.C. section 360bbb-3(b)(1), unless the authorization is terminated or revoked sooner.  Performed at [facility], [place] [address], [place], [place] [address]   Blood culture (routine x 2)     Status: None   Collection Time: 06/14/20  1:39 AM   Specimen: BLOOD  Result Value Ref Range Status   Specimen Description BLOOD RIGHT ANTECUBITAL  Final   Special Requests   Final    BOTTLES DRAWN AEROBIC AND ANAEROBIC Blood Culture adequate volume   Culture   Final    NO GROWTH 5 DAYS Performed at [facility], [place] [address], [place], [place] [address]    Report Status 06/19/2020 FINAL  Final   Time spent: 30 min  SIGNED:   Marylu Lund, MD  [facility] 06/21/2020, 11:21 AM  If 7PM-7AM, please contact night-coverage

## 2020-06-21 NOTE — TOC Transition Note (Signed)
Transition of Care [facility]) - CM/SW Discharge Note   Patient Details  Name: SALOME HAUTALA MRN: [id number] Date of Birth: May 08, 1928  Transition of Care [facility]) CM/SW Contact:  Vinie Sill, [place] Phone Number: 06/21/2020, 12:06 PM   Clinical Narrative:     Patient will DC to: [facility] Date: 06/21/2020 Family Notified:Cynthis Stubbs-DSS Guardian  Transport OM:[facility]   Per MD patient is ready for discharge. RN, patient, and facility notified of DC. Discharge Summary sent to facility. RN given number for [phone]. Ambulance transport requested for patient.   Clinical Social Worker signing off.  Thurmond Butts, MSW, [place] Clinical Social Worker    Final next level of care: [place] Barriers to Discharge: Barriers Resolved   Patient Goals and CMS Choice        Discharge Placement              [facility] bed at:  ([facility] Patient to be transferred to facility by: [place] Name of family member notified: DSS Gaurdian [name] Patient and family notified of of transfer: 06/21/20  Discharge Plan and Services In-house Referral: Clinical Social Work, Hospice / [place]                                   Social Determinants of Health (SDOH) Interventions     Readmission Risk Interventions No flowsheet data found.

## 2020-06-30 DEATH — deceased
